# Patient Record
Sex: Male | Born: 1955 | Race: White | Hispanic: No | Marital: Married | Smoking: Current every day smoker
Health system: Southern US, Community
[De-identification: ages and names within clinical notes are randomized; demographics above are authoritative.]

## PROBLEM LIST (undated history)

## (undated) DIAGNOSIS — K219 Gastro-esophageal reflux disease without esophagitis: Secondary | ICD-10-CM

## (undated) DIAGNOSIS — G709 Myoneural disorder, unspecified: Secondary | ICD-10-CM

## (undated) DIAGNOSIS — F32A Depression, unspecified: Secondary | ICD-10-CM

## (undated) DIAGNOSIS — E785 Hyperlipidemia, unspecified: Secondary | ICD-10-CM

## (undated) DIAGNOSIS — I10 Essential (primary) hypertension: Secondary | ICD-10-CM

## (undated) DIAGNOSIS — N189 Chronic kidney disease, unspecified: Secondary | ICD-10-CM

## (undated) DIAGNOSIS — F329 Major depressive disorder, single episode, unspecified: Secondary | ICD-10-CM

## (undated) DIAGNOSIS — C61 Malignant neoplasm of prostate: Secondary | ICD-10-CM

## (undated) DIAGNOSIS — R519 Headache, unspecified: Secondary | ICD-10-CM

## (undated) DIAGNOSIS — I219 Acute myocardial infarction, unspecified: Secondary | ICD-10-CM

## (undated) HISTORY — DX: Essential (primary) hypertension: I10

## (undated) HISTORY — DX: Major depressive disorder, single episode, unspecified: F32.9

## (undated) HISTORY — DX: Depression, unspecified: F32.A

## (undated) HISTORY — DX: Gastro-esophageal reflux disease without esophagitis: K21.9

## (undated) HISTORY — DX: Hyperlipidemia, unspecified: E78.5

---

## 2011-12-29 HISTORY — PX: US ECHOCARDIOGRAPHY: HXRAD669

## 2011-12-29 HISTORY — PX: CORONARY ANGIOPLASTY WITH STENT PLACEMENT: SHX49

## 2012-01-19 ENCOUNTER — Ambulatory Visit: Payer: Self-pay | Admitting: Internal Medicine

## 2012-01-20 LAB — BASIC METABOLIC PANEL
BUN: 15 mg/dL (ref 7–18)
Calcium, Total: 8.9 mg/dL (ref 8.5–10.1)
Chloride: 103 mmol/L (ref 98–107)
EGFR (African American): >60
EGFR (Non-African Amer.): >60
Osmolality: 275 (ref 275–301)
Potassium: 4.1 mmol/L (ref 3.5–5.1)

## 2012-01-20 LAB — CK TOTAL AND CKMB (NOT AT ARMC)
CK, Total: 70 U/L (ref 35–232)
CK-MB: 1.7 ng/mL (ref 0.5–3.6)

## 2013-11-11 ENCOUNTER — Ambulatory Visit: Payer: Self-pay | Admitting: Family Medicine

## 2014-09-16 NOTE — Discharge Summary (Signed)
PATIENT NAME:  Michael Small, Michael Small MR#:  [id number] DATE OF BIRTH:  [birth year]  DATE OF ADMISSION:  01/19/2012 DATE OF DISCHARGE:  01/20/2012  DISCHARGE DIAGNOSES:  1. Known cardiovascular disease with unstable angina.  2. Hyperlipidemia.  3. Hypertension.  HISTORY: This is a 59 year old male with progressive episodes of chest discomfort, tobacco abuse, borderline hyperlipidemia and hypertension who underwent cardiac catheterization. The patient underwent cardiac catheterization showing significant 85% left anterior descending artery stenosis and minimal atherosclerosis of circumflex and right coronary artery. He therefore underwent drug-eluting stent placement in the left anterior descending artery without complication. He was ambulating well without any further significant symptoms and felt well and had reached his maximal hospital benefit. He was discharged to home in good condition with follow up in 3 to 4 days.   DISCHARGE MEDICATIONS: 1. Aspirin 81 mg p.o. daily.  2. Plavix 75 mg p.o. daily.   FOLLOW UP: He is to follow up a few days for further assessment of lipid management and medication management for hypertension.   ____________________________ Corey Skains, MD bjk:cms D: 01/20/2012 07:32:55 ET T: 01/20/2012 10:43:50 ET JOB#: [id number]  cc: Corey Skains, MD, <Dictator> Corey Skains MD ELECTRONICALLY SIGNED 01/23/2012 8:31

## 2015-02-06 ENCOUNTER — Other Ambulatory Visit: Payer: Self-pay | Admitting: Family Medicine

## 2015-03-28 ENCOUNTER — Other Ambulatory Visit: Payer: Self-pay | Admitting: Family Medicine

## 2015-04-01 ENCOUNTER — Other Ambulatory Visit: Payer: Self-pay | Admitting: Family Medicine

## 2015-04-27 ENCOUNTER — Other Ambulatory Visit: Payer: Self-pay | Admitting: Family Medicine

## 2015-04-27 NOTE — Telephone Encounter (Signed)
Patient has not been seen in nearly a year and a half, he needs to make appointment before we can approve prescription refill.

## 2015-05-08 DIAGNOSIS — E785 Hyperlipidemia, unspecified: Secondary | ICD-10-CM | POA: Insufficient documentation

## 2015-05-08 DIAGNOSIS — I10 Essential (primary) hypertension: Secondary | ICD-10-CM | POA: Insufficient documentation

## 2015-05-08 DIAGNOSIS — M1990 Unspecified osteoarthritis, unspecified site: Secondary | ICD-10-CM | POA: Insufficient documentation

## 2015-05-08 DIAGNOSIS — R739 Hyperglycemia, unspecified: Secondary | ICD-10-CM | POA: Insufficient documentation

## 2015-05-08 DIAGNOSIS — F32A Depression, unspecified: Secondary | ICD-10-CM | POA: Insufficient documentation

## 2015-05-08 DIAGNOSIS — F329 Major depressive disorder, single episode, unspecified: Secondary | ICD-10-CM | POA: Insufficient documentation

## 2015-05-08 DIAGNOSIS — I2510 Atherosclerotic heart disease of native coronary artery without angina pectoris: Secondary | ICD-10-CM | POA: Insufficient documentation

## 2015-05-08 DIAGNOSIS — B353 Tinea pedis: Secondary | ICD-10-CM | POA: Insufficient documentation

## 2015-05-08 DIAGNOSIS — R12 Heartburn: Secondary | ICD-10-CM | POA: Insufficient documentation

## 2015-05-08 DIAGNOSIS — F17210 Nicotine dependence, cigarettes, uncomplicated: Secondary | ICD-10-CM | POA: Insufficient documentation

## 2015-05-12 ENCOUNTER — Encounter: Payer: Self-pay | Admitting: Family Medicine

## 2015-05-12 ENCOUNTER — Ambulatory Visit (INDEPENDENT_AMBULATORY_CARE_PROVIDER_SITE_OTHER): Payer: Medicare Other | Admitting: Family Medicine

## 2015-05-12 VITALS — BP 160/88 | HR 87 | Temp 97.8°F | Resp 16 | Ht 71.0 in | Wt 201.0 lb

## 2015-05-12 DIAGNOSIS — I2510 Atherosclerotic heart disease of native coronary artery without angina pectoris: Secondary | ICD-10-CM

## 2015-05-12 DIAGNOSIS — I10 Essential (primary) hypertension: Secondary | ICD-10-CM | POA: Diagnosis not present

## 2015-05-12 DIAGNOSIS — Z720 Tobacco use: Secondary | ICD-10-CM | POA: Diagnosis not present

## 2015-05-12 DIAGNOSIS — Z23 Encounter for immunization: Secondary | ICD-10-CM | POA: Diagnosis not present

## 2015-05-12 DIAGNOSIS — R739 Hyperglycemia, unspecified: Secondary | ICD-10-CM | POA: Diagnosis not present

## 2015-05-12 LAB — POCT GLYCOSYLATED HEMOGLOBIN (HGB A1C)
ESTIMATED AVERAGE GLUCOSE: 114
Hemoglobin A1C: 5.6

## 2015-05-12 MED ORDER — LOSARTAN POTASSIUM-HCTZ 100-12.5 MG PO TABS: 1.0000 | ORAL_TABLET | Freq: Every day | ORAL | Status: DC

## 2015-05-12 MED ORDER — BUPROPION HCL ER (SR) 150 MG PO TB12: 150.0000 mg | ORAL_TABLET | Freq: Three times a day (TID) | ORAL | Status: DC

## 2015-05-12 MED ORDER — ATORVASTATIN CALCIUM 10 MG PO TABS: 10.0000 mg | ORAL_TABLET | Freq: Every day | ORAL | Status: DC

## 2015-05-12 MED ORDER — BUPROPION HCL ER (SR) 150 MG PO TB12: ORAL_TABLET | ORAL | Status: DC

## 2015-05-12 NOTE — Progress Notes (Signed)
Patient: Michael Small Male    DOB: 10-19-1955   59 y.o.   MRN: [id number] Visit Date: 05/12/2015  Today's Provider: [name], MD   Chief Complaint  Patient presents with  . Hypertension    follow up  . Hyperglycemia    follow up  . Hyperlipidemia    follow up   Subjective:    HPI   Hypertension, follow-up:  BP Readings from Last 3 Encounters:  11/11/13 166/100    He was last seen for hypertension 1 years ago.  BP at that visit was 160/100. Management since that visit includes started Hyzarr 100-12.'5mg'$  daily. He reports good compliance with treatment. Patient has been out of his medication  for 8 days. He is not having side effects.  He is not exercising. He is not adherent to low salt diet.   Outside blood pressures are average 155/82. He is experiencing fatigue.  Patient denies chest pain, chest pressure/discomfort, claudication, dyspnea, exertional chest pressure/discomfort, irregular heart beat, lower extremity edema, near-syncope, orthopnea, palpitations, paroxysmal nocturnal dyspnea, syncope and tachypnea.   Cardiovascular risk factors include advanced age (older than 78 for men, 80 for women), hypertension, male gender, sedentary lifestyle and smoking/ tobacco exposure.  Use of agents associated with hypertension: NSAIDS.     Weight trend: increasing steadily Wt Readings from Last 3 Encounters:  11/11/13 188 lb (85.276 kg)    Current diet: in general, an "unhealthy" diet  ------------------------------------------------------------------------   Hyperglycemia, Follow-up:   Lab Results  Component Value Date   GLUCOSE 101* 01/20/2012      Last seen for for this 1 years ago.  Management since then includes advising patient to avoid sweets in diet. Current symptoms include none and have been stable.  Weight trend: increasing steadily Prior visit with dietician: no Current diet: in general, an "unhealthy" diet Current exercise:  none  Pertinent Labs:    Component Value Date/Time   CREATININE 0.90 01/20/2012 0615    Wt Readings from Last 3 Encounters:  11/11/13 188 lb (85.276 kg)    Lipid/Cholesterol, Follow-up:   Last seen for this1 years ago.  Management changes since that visit include starting Atorvastatin '10mg'$  daily. . Last Lipid Panel:   Risk factors for vascular disease include hypertension and smoking  He reports poor compliance with treatment. Has been out of his medication for several months. He is not having side effects.  Current symptoms include none and have been stable. Weight trend: increasing steadily Prior visit with dietician: no Current diet: in general, an "unhealthy" diet Current exercise: none  Wt Readings from Last 3 Encounters:  11/11/13 188 lb (85.276 kg)    -------------------------------------------------------------------   Coronary artery disease, follow up  Denies any chest pain, heart flutters, or dyspnea. He was previously followed by Dr. [name], but states he is no longer going to cardiologist. Is taking ASA every day, but out of other medications as above.  Is still smoking 1/2 ppd.  Lipid Panel  No results found for: CHOL, TRIG, HDL, CHOLHDL, VLDL, LDLCALC, LDLDIRECT  ------------------------------------------------------------------------       No Known Allergies Previous Medications   ASPIRIN 81 MG TABLET    Take 1 tablet by mouth daily.   ATORVASTATIN (LIPITOR) 10 MG TABLET    Take 1 tablet by mouth daily.   BUPROPION (WELLBUTRIN SR) 150 MG 12 HR TABLET    Take 1 tablet (150 mg total) by mouth 3 (three) times daily.   LOSARTAN-HYDROCHLOROTHIAZIDE (HYZAAR) 100-12.5 MG  TABLET    TAKE 1 TABLET EVERY DAY   OMEGA 3 1000 MG CAPS    Take 1 capsule by mouth daily.    Review of Systems  Constitutional: Positive for fatigue. Negative for fever, chills and appetite change.  Respiratory: Negative for chest tightness, shortness of breath and wheezing.    Cardiovascular: Negative for chest pain and palpitations.  Gastrointestinal: Negative for nausea, vomiting and abdominal pain.  Endocrine: Positive for polyuria. Negative for cold intolerance, heat intolerance, polydipsia and polyphagia.  Musculoskeletal: Positive for back pain and arthralgias (right shoulder pain).    Social History  Substance Use Topics  . Smoking status: Current Every Day Smoker -- 0.50 packs/day for 30 years    Types: Cigarettes  . Smokeless tobacco: Never Used  . Alcohol Use: No   Objective:   BP 160/88 mmHg  Pulse 87  Temp(Src) 97.8 F (36.6 C) (Oral)  Resp 16  Ht '5\' 11"'$  (1.803 m)  Wt 201 lb (91.173 kg)  BMI 28.05 kg/m2  SpO2 99%  Physical Exam   General Appearance:    Alert, cooperative, no distress  Eyes:    PERRL, conjunctiva/corneas clear, EOM's intact       Lungs:     Clear to auscultation bilaterally, respirations unlabored  Heart:    Regular rate and rhythm  Neurologic:   Awake, alert, oriented x 3. No apparent focal neurological           defect.       Results for orders placed or performed in visit on 05/12/15  POCT HgB A1C  Result Value Ref Range   Hemoglobin A1C 5.6    Est. average glucose Bld gHb Est-mCnc 114         Assessment & Plan:     1. Hyperglycemia  - POCT HgB A1C - atorvastatin (LIPITOR) 10 MG tablet; Take 1 tablet (10 mg total) by mouth daily.  Dispense: 30 tablet; Refill: 3  2. Atherosclerosis of native coronary artery of native heart without angina pectoris Asymptomatic. Compliant with medication.  Continue aggressive risk factor modification.   - EKG 12-Lead  3. Essential hypertension BP uncontrolled off of medictions. Will restart and follow up in 3 months.  - losartan-hydrochlorothiazide (HYZAAR) 100-12.5 MG tablet; Take 1 tablet by mouth daily.  Dispense: 30 tablet; Refill: 3  4. Blood glucose elevated Doing better. Monitor 1-2 times per year.   5. Tobacco abuse Counseled health benefits of smoking  cessation. He did well with buproprion in the past and will restart - buPROPion (WELLBUTRIN SR) 150 MG 12 hr tablet; One daily for 3 days, then one twice daily for 3 days, then one tablet three times a day  Dispense: 90 tablet; Refill: 3  6. Need for influenza vaccination Discussed shingles vaccine which he declined.  - Flu Vaccine QUAD 36+ mos PF IM (Fluarix & Fluzone Quad PF)       [name], MD  [facility]

## 2015-05-12 NOTE — Patient Instructions (Signed)
Please stop smoking 

## 2015-07-09 ENCOUNTER — Other Ambulatory Visit: Payer: Self-pay | Admitting: *Deleted

## 2015-07-09 DIAGNOSIS — I10 Essential (primary) hypertension: Secondary | ICD-10-CM

## 2015-07-09 DIAGNOSIS — R739 Hyperglycemia, unspecified: Secondary | ICD-10-CM

## 2015-07-09 DIAGNOSIS — Z720 Tobacco use: Secondary | ICD-10-CM

## 2015-07-09 NOTE — Telephone Encounter (Signed)
Requesting 90 day supply, [facility] [address]

## 2015-07-10 ENCOUNTER — Other Ambulatory Visit: Payer: Self-pay | Admitting: *Deleted

## 2015-07-10 NOTE — Telephone Encounter (Signed)
Patient has ov appt scheduled 08/13/2015.

## 2015-07-13 ENCOUNTER — Other Ambulatory Visit: Payer: Self-pay | Admitting: *Deleted

## 2015-07-13 DIAGNOSIS — Z720 Tobacco use: Secondary | ICD-10-CM

## 2015-07-13 DIAGNOSIS — R739 Hyperglycemia, unspecified: Secondary | ICD-10-CM

## 2015-07-13 DIAGNOSIS — I10 Essential (primary) hypertension: Secondary | ICD-10-CM

## 2015-07-13 NOTE — Telephone Encounter (Signed)
Requesting 90 day supply.

## 2015-08-13 ENCOUNTER — Ambulatory Visit (INDEPENDENT_AMBULATORY_CARE_PROVIDER_SITE_OTHER): Payer: Medicare Other | Admitting: Family Medicine

## 2015-08-13 ENCOUNTER — Encounter: Payer: Self-pay | Admitting: Family Medicine

## 2015-08-13 VITALS — BP 160/80 | HR 78 | Temp 97.0°F | Resp 16 | Ht 71.0 in | Wt 202.0 lb

## 2015-08-13 DIAGNOSIS — E785 Hyperlipidemia, unspecified: Secondary | ICD-10-CM

## 2015-08-13 DIAGNOSIS — I10 Essential (primary) hypertension: Secondary | ICD-10-CM | POA: Diagnosis not present

## 2015-08-13 DIAGNOSIS — M7550 Bursitis of unspecified shoulder: Secondary | ICD-10-CM | POA: Insufficient documentation

## 2015-08-13 DIAGNOSIS — M7552 Bursitis of left shoulder: Secondary | ICD-10-CM

## 2015-08-13 MED ORDER — DICLOFENAC POTASSIUM 50 MG PO TABS: 50.0000 mg | ORAL_TABLET | Freq: Two times a day (BID) | ORAL | Status: DC | PRN

## 2015-08-13 NOTE — Patient Instructions (Addendum)
If your labs look, we will plan on increasing losartan-hctz to 100-'25mg'$  daily  You should see an orthopedist about your shoulder if the diclofenac

## 2015-08-13 NOTE — Progress Notes (Signed)
Patient: Michael Small Male    DOB: January 03, 1956   59 y.o.   MRN: [id number] Visit Date: 08/13/2015  Today's Provider: [name], MD   Chief Complaint  Patient presents with  . Follow-up  . Hypertension   Subjective:    HPI    Hypertension, follow-up:  BP Readings from Last 3 Encounters:  08/13/15 160/80  05/12/15 160/88  11/11/13 166/100    He was last seen for hypertension 3 months ago.  BP at that visit was 160/88. Management since that visit includes; restarted losartan-HCTZ 100-12.5 mg mg qd..He reports good compliance with treatment. He is not having side effects. none  He is exercising. He is not adherent to low salt diet.   Outside blood pressures are 140/85. He is experiencing none.  Patient denies none.   Cardiovascular risk factors include none.  Use of agents associated with hypertension: none.   ----------------------------------------------------------------------  Shoulder pain  He reports pain left shoulder for the last couple of months and not improving at all with OTC pain medications. He thinks he may have overused it, but does not recall any specific injury.    No Known Allergies Previous Medications   ASPIRIN 81 MG TABLET    Take 1 tablet by mouth daily.   ATORVASTATIN (LIPITOR) 10 MG TABLET    Take 1 tablet (10 mg total) by mouth daily.   BUPROPION (WELLBUTRIN SR) 150 MG 12 HR TABLET    One daily for 3 days, then one twice daily for 3 days, then one tablet three times a day   LOSARTAN-HYDROCHLOROTHIAZIDE (HYZAAR) 100-12.5 MG TABLET    Take 1 tablet by mouth daily.    Review of Systems  Constitutional: Negative for fever, chills and appetite change.  Respiratory: Negative for chest tightness, shortness of breath and wheezing.   Cardiovascular: Negative for chest pain and palpitations.  Gastrointestinal: Negative for nausea, vomiting and abdominal pain.    Social History  Substance Use Topics  . Smoking status: Current Every  Day Smoker -- 0.50 packs/day for 30 years    Types: Cigarettes  . Smokeless tobacco: Never Used  . Alcohol Use: No   Objective:   BP 160/80 mmHg  Pulse 78  Temp(Src) 97 F (36.1 C) (Oral)  Resp 16  Ht '5\' 11"'$  (1.803 m)  Wt 202 lb (91.627 kg)  BMI 28.19 kg/m2  SpO2 97%  Physical Exam   General Appearance:    Alert, cooperative, no distress  Eyes:    PERRL, conjunctiva/corneas clear, EOM's intact       Lungs:     Clear to auscultation bilaterally, respirations unlabored  Heart:    Regular rate and rhythm  Neurologic:   Awake, alert, oriented x 3. No apparent focal neurological           defect.   MS:   Tender lateral aspect of shoulder. Pain with abduction, but full ROM. No gross deformities.       Assessment & Plan:      1. Essential hypertension Tolerating losartan-hctz, will increase to 100-'25mg'$  daily.  - Renal function panel - losartan-hydrochlorothiazide (HYZAAR) 100-25 MG tablet; Take 1 tablet by mouth daily.  Dispense: 30 tablet; Refill: 3  2. HLD (hyperlipidemia) He is tolerating atorvastatin well with no adverse effects.   - Lipid panel - Hepatic function panel  3. . Bursitis, shoulder, left Try prescription NSAID. Advised that if it is not effective I would recommend orthopedics referral and consideration of steroid  injection.  - diclofenac (CATAFLAM) 50 MG tablet; Take 1 tablet (50 mg total) by mouth 2 (two) times daily as needed (shoulder pain).  Dispense: 30 tablet; Refill: 3       [name], MD  [facility]

## 2015-08-14 ENCOUNTER — Telehealth: Payer: Self-pay

## 2015-08-14 DIAGNOSIS — R739 Hyperglycemia, unspecified: Secondary | ICD-10-CM

## 2015-08-14 LAB — RENAL FUNCTION PANEL
Albumin: 4.6 g/dL (ref 3.6–4.8)
BUN/Creatinine Ratio: 13 (ref 10–22)
BUN: 20 mg/dL (ref 8–27)
CALCIUM: 9.7 mg/dL (ref 8.6–10.2)
CO2: 23 mmol/L (ref 18–29)
CREATININE: 1.55 mg/dL — AB (ref 0.76–1.27)
Chloride: 97 mmol/L (ref 96–106)
GFR calc Af Amer: 55 mL/min/{1.73_m2} — ABNORMAL LOW (ref >59)
GFR, EST NON AFRICAN AMERICAN: 48 mL/min/{1.73_m2} — AB (ref >59)
Glucose: 97 mg/dL (ref 65–99)
PHOSPHORUS: 3.1 mg/dL (ref 2.5–4.5)
Potassium: 4.9 mmol/L (ref 3.5–5.2)
Sodium: 139 mmol/L (ref 134–144)

## 2015-08-14 LAB — HEPATIC FUNCTION PANEL
ALT: 16 IU/L (ref 0–44)
AST: 13 IU/L (ref 0–40)
Alkaline Phosphatase: 71 IU/L (ref 39–117)
BILIRUBIN TOTAL: 0.2 mg/dL (ref 0.0–1.2)
BILIRUBIN, DIRECT: 0.07 mg/dL (ref 0.00–0.40)
TOTAL PROTEIN: 7.1 g/dL (ref 6.0–8.5)

## 2015-08-14 LAB — LIPID PANEL
CHOLESTEROL TOTAL: 236 mg/dL — AB (ref 100–199)
Chol/HDL Ratio: 6.2 ratio units — ABNORMAL HIGH (ref 0.0–5.0)
HDL: 38 mg/dL — ABNORMAL LOW (ref >39)
LDL CALC: 159 mg/dL — AB (ref 0–99)
Triglycerides: 196 mg/dL — ABNORMAL HIGH (ref 0–149)
VLDL CHOLESTEROL CAL: 39 mg/dL (ref 5–40)

## 2015-08-14 MED ORDER — LOSARTAN POTASSIUM-HCTZ 100-25 MG PO TABS: 1.0000 | ORAL_TABLET | Freq: Every day | ORAL | Status: DC

## 2015-08-14 MED ORDER — ATORVASTATIN CALCIUM 20 MG PO TABS: 10.0000 mg | ORAL_TABLET | Freq: Every day | ORAL | Status: DC

## 2015-08-14 NOTE — Telephone Encounter (Signed)
Losartan-hctz 100-25. rx has already been sent to pharmacy. Thanks.

## 2015-08-14 NOTE — Telephone Encounter (Signed)
Patient advised as below. Patient verbalizes understanding and is in agreement with treatment plan.  

## 2015-08-14 NOTE — Telephone Encounter (Signed)
Dr. Caryn Section please clarify the dose on losartan-hctz thank you. sd

## 2015-08-14 NOTE — Telephone Encounter (Signed)
-----   Message from Birdie Sons, MD sent at 08/14/2015  7:51 AM EDT ----- Need to increase atorvastatin to '20mg'$  daily, #30, rf x3. Please advise and send new rx. Otherwise labs are good. Go ahead and increase losartan-hctz to 20-25. [facility]. Schedule follow up in 3 months.

## 2015-09-26 ENCOUNTER — Other Ambulatory Visit: Payer: Self-pay | Admitting: Family Medicine

## 2015-11-11 ENCOUNTER — Ambulatory Visit: Payer: Medicare Other | Admitting: Family Medicine

## 2015-12-21 ENCOUNTER — Other Ambulatory Visit: Payer: Self-pay | Admitting: Family Medicine

## 2015-12-21 DIAGNOSIS — I10 Essential (primary) hypertension: Secondary | ICD-10-CM

## 2016-06-10 ENCOUNTER — Other Ambulatory Visit: Payer: Self-pay | Admitting: Family Medicine

## 2016-07-13 ENCOUNTER — Other Ambulatory Visit: Payer: Self-pay | Admitting: Family Medicine

## 2016-07-13 DIAGNOSIS — I10 Essential (primary) hypertension: Secondary | ICD-10-CM

## 2016-08-03 ENCOUNTER — Other Ambulatory Visit: Payer: Self-pay | Admitting: Family Medicine

## 2016-08-03 DIAGNOSIS — I10 Essential (primary) hypertension: Secondary | ICD-10-CM

## 2016-08-26 ENCOUNTER — Other Ambulatory Visit: Payer: Self-pay | Admitting: Family Medicine

## 2016-09-04 ENCOUNTER — Other Ambulatory Visit: Payer: Self-pay | Admitting: Family Medicine

## 2016-09-04 DIAGNOSIS — I10 Essential (primary) hypertension: Secondary | ICD-10-CM

## 2016-10-10 ENCOUNTER — Other Ambulatory Visit: Payer: Self-pay | Admitting: Family Medicine

## 2016-10-10 DIAGNOSIS — I10 Essential (primary) hypertension: Secondary | ICD-10-CM

## 2016-11-03 ENCOUNTER — Other Ambulatory Visit: Payer: Self-pay | Admitting: Family Medicine

## 2016-11-03 DIAGNOSIS — I10 Essential (primary) hypertension: Secondary | ICD-10-CM

## 2016-11-03 NOTE — Telephone Encounter (Addendum)
Scheduled f/u for 11/15/2016 at 8:15 am.

## 2016-11-03 NOTE — Telephone Encounter (Signed)
Please advise patient he is overdue for office visit and needs to schedule appt before we can approve any additional refills.

## 2016-11-15 ENCOUNTER — Ambulatory Visit (INDEPENDENT_AMBULATORY_CARE_PROVIDER_SITE_OTHER): Payer: Medicare Other | Admitting: Family Medicine

## 2016-11-15 ENCOUNTER — Encounter: Payer: Self-pay | Admitting: Family Medicine

## 2016-11-15 VITALS — BP 144/70 | HR 79 | Temp 97.4°F | Resp 16 | Ht 71.0 in | Wt 182.0 lb

## 2016-11-15 DIAGNOSIS — R739 Hyperglycemia, unspecified: Secondary | ICD-10-CM | POA: Diagnosis not present

## 2016-11-15 DIAGNOSIS — I2510 Atherosclerotic heart disease of native coronary artery without angina pectoris: Secondary | ICD-10-CM

## 2016-11-15 DIAGNOSIS — E785 Hyperlipidemia, unspecified: Secondary | ICD-10-CM | POA: Diagnosis not present

## 2016-11-15 DIAGNOSIS — I10 Essential (primary) hypertension: Secondary | ICD-10-CM

## 2016-11-15 DIAGNOSIS — Z23 Encounter for immunization: Secondary | ICD-10-CM | POA: Diagnosis not present

## 2016-11-15 DIAGNOSIS — F17210 Nicotine dependence, cigarettes, uncomplicated: Secondary | ICD-10-CM | POA: Diagnosis not present

## 2016-11-15 NOTE — Patient Instructions (Addendum)
   Dr. Brita Romp will be starting in August and should be able to see your wife as a patient   Screening for lung cancer is recommended for people between 68 and 62 years of age who have smoked the equivalent of 1 pack per day for 30 years. Please call our office at [phone] to schedule a low dose CT lung scan for lung cancer screening.

## 2016-11-15 NOTE — Progress Notes (Signed)
Patient: Michael Small Male    DOB: 1955/11/26   61 y.o.   MRN: [id number] Visit Date: 11/15/2016  Today's Provider: [name], MD   Chief Complaint  Patient presents with  . Follow-up  . Hypertension  . Hyperlipidemia   Subjective:    HPI   Hypertension, follow-up:  BP Readings from Last 3 Encounters:  08/13/15 (!) 160/80  05/12/15 (!) 160/88  11/11/13 (!) 166/100    He was last seen for hypertension 3 months ago.  BP at that visit was 160/80. Management since that visit includes; increased Losartan-HCTZ to 100-25mg .He reports good compliance with treatment. He is not having side effects. none He is exercising. He is adherent to low salt diet.   Outside blood pressures are 123/73. He is experiencing none.  Patient denies none.   Cardiovascular risk factors include none.  Use of agents associated with hypertension: none.   ----------------------------------------------------------------   Lipid/Cholesterol, Follow-up:   Last seen for this 3 months ago.  Management since that visit includes; increased atorvastatin to 20 mg qd.  Last Lipid Panel:    Component Value Date/Time   CHOL 236 (H) 08/13/2015 1202   TRIG 196 (H) 08/13/2015 1202   HDL 38 (L) 08/13/2015 1202   CHOLHDL 6.2 (H) 08/13/2015 1202   LDLCALC 159 (H) 08/13/2015 1202    He reports good compliance with treatment. He is not having side effects. none  Wt Readings from Last 3 Encounters:  08/13/15 202 lb (91.6 kg)  05/12/15 201 lb (91.2 kg)  11/11/13 188 lb (85.3 kg)    ----------------------------------------------------------------  Hyperglycemia From 05/12/2015-labs checked, no changes. A1C 5.6.  Tobacco abuse: Formerly smoke 2-3 ppd but has cut back to 1/2 ppd since being on bupropion. He is tolerating medication well and thinks it is helping cut urge to smoke. He is still working on stopping completely.    No Known Allergies   Current Outpatient Prescriptions:  .   aspirin 81 MG tablet, Take 1 tablet by mouth daily., Disp: , Rfl:  .  atorvastatin (LIPITOR) 20 MG tablet, Take 0.5 tablets (10 mg total) by mouth daily., Disp: 30 tablet, Rfl: 3 .  buPROPion (WELLBUTRIN SR) 150 MG 12 hr tablet, TAKE 1 TABLET BY MOUTH 3 TIMES A DAY**NEEDS OFFICE VISIT**, Disp: 45 tablet, Rfl: 5 .  diclofenac (CATAFLAM) 50 MG tablet, Take 1 tablet (50 mg total) by mouth 2 (two) times daily as needed (shoulder pain)., Disp: 30 tablet, Rfl: 3 .  losartan-hydrochlorothiazide (HYZAAR) 100-25 MG tablet, TAKE 1 TABLET BY MOUTH EVERY DAY, Disp: 30 tablet, Rfl: 0  Review of Systems  Constitutional: Negative for appetite change, chills and fever.  Respiratory: Negative for chest tightness, shortness of breath and wheezing.   Cardiovascular: Negative for chest pain and palpitations.  Gastrointestinal: Negative for abdominal pain, nausea and vomiting.    Social History  Substance Use Topics  . Smoking status: Current Every Day Smoker    Packs/day: 0.50    Years: 30.00    Types: Cigarettes  . Smokeless tobacco: Never Used  . Alcohol use No   Objective:   BP (!) 170/90 (BP Location: Right Arm, Patient Position: Sitting, Cuff Size: Normal)   Pulse 79   Temp 97.4 F (36.3 C) (Oral)   Resp 16   Ht 5\' 11"  (1.803 m)   Wt 182 lb (82.6 kg)   SpO2 98%   BMI 25.38 kg/m  Vitals:   11/15/16 1194 11/15/16 0835 11/15/16 1740  BP: (!) 170/90 (!) 150/80 (!) 144/70  Pulse: 79    Resp: 16    Temp: 97.4 F (36.3 C)    TempSrc: Oral    SpO2: 98%    Weight: 182 lb (82.6 kg)    Height: 5\' 11"  (1.803 m)       Physical Exam   General Appearance:    Alert, cooperative, no distress  Eyes:    PERRL, conjunctiva/corneas clear, EOM's intact       Lungs:     Clear to auscultation bilaterally, respirations unlabored  Heart:    Regular rate and rhythm  Neurologic:   Awake, alert, oriented x 3. No apparent focal neurological           defect.           Assessment & Plan:     1.  Essential hypertension Improved with increase losartan-hctz. Continue current medications.  For now. Home BP reading are considerably better than office readings.  - Comprehensive metabolic panel  2. Hyperlipidemia, unspecified hyperlipidemia type He is tolerating atorvastatin well with no adverse effects.   - Comprehensive metabolic panel - Lipid panel  3. Blood glucose elevated  - Hemoglobin A1c  4. Atherosclerosis of native coronary artery of native heart without angina pectoris Asymptomatic. Compliant with medication.  Continue aggressive risk factor modification.    5. Smoking greater than 30 pack years Continue bupropion and efforts to quit smoking.  - Ambulatory Referral for Lung Cancer Scre  6. Need for prophylactic vaccination using tetanus and diphtheria toxoids adsorbed (Td) vaccine  - Tdap vaccine greater than or equal to 7yo IM  Return in about 6 months (around 05/17/2017).   The entirety of the information documented in the History of Present Illness, Review of Systems and Physical Exam were personally obtained by me. Portions of this information were initially documented by April M. Sabra Heck, CMA and reviewed by me for thoroughness and accuracy.          [name], MD  [facility]

## 2016-11-16 ENCOUNTER — Other Ambulatory Visit: Payer: Self-pay

## 2016-11-16 ENCOUNTER — Telehealth: Payer: Self-pay | Admitting: *Deleted

## 2016-11-16 DIAGNOSIS — Z87891 Personal history of nicotine dependence: Secondary | ICD-10-CM

## 2016-11-16 DIAGNOSIS — R739 Hyperglycemia, unspecified: Secondary | ICD-10-CM

## 2016-11-16 DIAGNOSIS — E785 Hyperlipidemia, unspecified: Secondary | ICD-10-CM

## 2016-11-16 LAB — COMPREHENSIVE METABOLIC PANEL
ALK PHOS: 71 IU/L (ref 39–117)
ALT: 16 IU/L (ref 0–44)
AST: 12 IU/L (ref 0–40)
Albumin/Globulin Ratio: 1.8 (ref 1.2–2.2)
Albumin: 4.5 g/dL (ref 3.6–4.8)
BUN/Creatinine Ratio: 26 — ABNORMAL HIGH (ref 10–24)
BUN: 40 mg/dL — ABNORMAL HIGH (ref 8–27)
Bilirubin Total: 0.3 mg/dL (ref 0.0–1.2)
CALCIUM: 10.1 mg/dL (ref 8.6–10.2)
CO2: 22 mmol/L (ref 20–29)
CREATININE: 1.55 mg/dL — AB (ref 0.76–1.27)
Chloride: 100 mmol/L (ref 96–106)
GFR calc Af Amer: 55 mL/min/{1.73_m2} — ABNORMAL LOW (ref >59)
GFR, EST NON AFRICAN AMERICAN: 48 mL/min/{1.73_m2} — AB (ref >59)
GLOBULIN, TOTAL: 2.5 g/dL (ref 1.5–4.5)
Glucose: 102 mg/dL — ABNORMAL HIGH (ref 65–99)
Potassium: 4.8 mmol/L (ref 3.5–5.2)
Sodium: 139 mmol/L (ref 134–144)
Total Protein: 7 g/dL (ref 6.0–8.5)

## 2016-11-16 LAB — LIPID PANEL
CHOL/HDL RATIO: 4.4 ratio (ref 0.0–5.0)
CHOLESTEROL TOTAL: 201 mg/dL — AB (ref 100–199)
HDL: 46 mg/dL (ref >39)
LDL CALC: 133 mg/dL — AB (ref 0–99)
TRIGLYCERIDES: 109 mg/dL (ref 0–149)
VLDL Cholesterol Cal: 22 mg/dL (ref 5–40)

## 2016-11-16 LAB — HEMOGLOBIN A1C
Est. average glucose Bld gHb Est-mCnc: 120 mg/dL
Hgb A1c MFr Bld: 5.8 % — ABNORMAL HIGH (ref 4.8–5.6)

## 2016-11-16 MED ORDER — ATORVASTATIN CALCIUM 20 MG PO TABS: 20.0000 mg | ORAL_TABLET | Freq: Every day | ORAL | 6 refills | Status: DC

## 2016-11-16 NOTE — Telephone Encounter (Signed)
-----   Message from Birdie Sons, MD sent at 11/16/2016 12:43 PM EDT ----- He should start back on the 20mg  tablet, same quantity and refills.

## 2016-11-16 NOTE — Telephone Encounter (Signed)
Received referral for initial lung cancer screening scan. Contacted patient and obtained smoking history,(current, 96 pack year) as well as answering questions related to screening process. Patient denies signs of lung cancer such as weight loss or hemoptysis. Patient denies comorbidity that would prevent curative treatment if lung cancer were found. Patient is scheduled for shared decision making visit and CT scan on 11/29/16.

## 2016-11-16 NOTE — Telephone Encounter (Signed)
Rx sent to pharmacy   

## 2016-11-24 ENCOUNTER — Encounter: Payer: Self-pay | Admitting: Family Medicine

## 2016-11-24 ENCOUNTER — Ambulatory Visit (INDEPENDENT_AMBULATORY_CARE_PROVIDER_SITE_OTHER): Payer: Medicare Other | Admitting: Family Medicine

## 2016-11-24 VITALS — BP 110/66 | HR 86 | Temp 98.5°F | Resp 16 | Wt 181.2 lb

## 2016-11-24 DIAGNOSIS — I2510 Atherosclerotic heart disease of native coronary artery without angina pectoris: Secondary | ICD-10-CM | POA: Diagnosis not present

## 2016-11-24 DIAGNOSIS — L089 Local infection of the skin and subcutaneous tissue, unspecified: Secondary | ICD-10-CM | POA: Diagnosis not present

## 2016-11-24 DIAGNOSIS — S81851A Open bite, right lower leg, initial encounter: Secondary | ICD-10-CM | POA: Diagnosis not present

## 2016-11-24 DIAGNOSIS — W540XXA Bitten by dog, initial encounter: Secondary | ICD-10-CM

## 2016-11-24 MED ORDER — SODIUM CHLORIDE 0.9 % EX SOLN: 2.0000 "application " | Freq: Two times a day (BID) | CUTANEOUS | 0 refills | Status: DC

## 2016-11-24 MED ORDER — AMOXICILLIN-POT CLAVULANATE 875-125 MG PO TABS: 1.0000 | ORAL_TABLET | Freq: Two times a day (BID) | ORAL | 0 refills | Status: DC

## 2016-11-24 NOTE — Patient Instructions (Signed)
Cleanse wounds daily with saline and put on a bandaid.

## 2016-11-24 NOTE — Progress Notes (Signed)
Subjective:     Patient ID: Michael Small, male   DOB: 25-Dec-1955, 61 y.o.   MRN: [id number]  HPI  Chief Complaint  Patient presents with  . Animal Bite    Patient comes in office today to address dog bite to his right leg on 11/06/16. Patient states that he was bitten by his friends dog, patient reports that dog is up to date on his shots. Patient reports that he clean site with soap and water and applied bandaid. Patient states that over the past 3 days his skin has become red and has had yellow drainage. Patients last recorded TDap was 11/15/16.  Also has been using antibiotic cream.   Review of Systems     Objective:   Physical Exam  Constitutional: He appears well-developed and well-nourished. No distress.  Skin:  Right lower calf with two sets of puncture wound with surrounding erythema. No drainage expressed.  Cleansed with saline and dressed.     Assessment:    1. Dog bite of right lower leg with infection, initial encounter - amoxicillin-clavulanate (AUGMENTIN) 875-125 MG tablet; Take 1 tablet by mouth 2 (two) times daily.  Dispense: 20 tablet; Refill: 0 - SODIUM CHLORIDE, EXTERNAL, ([facility] SALINE WOUND [place]) 0.9 % SOLN; Apply 2 application topically 2 (two) times daily.  Dispense: 210 mL; Refill: 0    Plan:   Cleanse daily with saline and apply bandaid.

## 2016-11-29 ENCOUNTER — Other Ambulatory Visit: Payer: Self-pay

## 2016-11-29 ENCOUNTER — Telehealth: Payer: Self-pay | Admitting: Emergency Medicine

## 2016-11-29 ENCOUNTER — Encounter: Payer: Self-pay | Admitting: Oncology

## 2016-11-29 ENCOUNTER — Ambulatory Visit: Admission: RE | Admit: 2016-11-29 | Payer: Medicare Other | Source: Ambulatory Visit

## 2016-11-29 ENCOUNTER — Inpatient Hospital Stay: Payer: Medicare Other | Attending: Oncology | Admitting: Oncology

## 2016-11-29 ENCOUNTER — Ambulatory Visit: Payer: Medicare Other

## 2016-11-29 ENCOUNTER — Ambulatory Visit
Admission: RE | Admit: 2016-11-29 | Discharge: 2016-11-29 | Disposition: A | Discharge status: Home / Self Care | Payer: Medicare Other | Source: Ambulatory Visit | Attending: Oncology | Admitting: Oncology

## 2016-11-29 DIAGNOSIS — I2510 Atherosclerotic heart disease of native coronary artery without angina pectoris: Secondary | ICD-10-CM | POA: Diagnosis not present

## 2016-11-29 DIAGNOSIS — F17210 Nicotine dependence, cigarettes, uncomplicated: Secondary | ICD-10-CM | POA: Diagnosis not present

## 2016-11-29 DIAGNOSIS — Z122 Encounter for screening for malignant neoplasm of respiratory organs: Secondary | ICD-10-CM | POA: Diagnosis not present

## 2016-11-29 DIAGNOSIS — J439 Emphysema, unspecified: Secondary | ICD-10-CM | POA: Insufficient documentation

## 2016-11-29 DIAGNOSIS — Z87891 Personal history of nicotine dependence: Secondary | ICD-10-CM | POA: Diagnosis not present

## 2016-11-29 DIAGNOSIS — Z1211 Encounter for screening for malignant neoplasm of colon: Secondary | ICD-10-CM

## 2016-11-29 NOTE — Telephone Encounter (Signed)
Pt wife called wanting to know the status of his cologaurd testing. They have not received anything about this. I did not see any orders for this or any mention of it. If this is something that you want ordered pt wife says to go ahead and order this. Please advise. Thanks.

## 2016-11-29 NOTE — Progress Notes (Signed)
In accordance with CMS guidelines, patient has met eligibility criteria including age, absence of signs or symptoms of lung cancer.  Social History  Substance Use Topics  . Smoking status: Current Every Day Smoker    Packs/day: 2.00    Years: 48.00    Types: Cigarettes  . Smokeless tobacco: Never Used  . Alcohol use No     A shared decision-making session was conducted prior to the performance of CT scan. This includes one or more decision aids, includes benefits and harms of screening, follow-up diagnostic testing, over-diagnosis, false positive rate, and total radiation exposure.  Counseling on the importance of adherence to annual lung cancer LDCT screening, impact of co-morbidities, and ability or willingness to undergo diagnosis and treatment is imperative for compliance of the program.  Counseling on the importance of continued smoking cessation for former smokers; the importance of smoking cessation for current smokers, and information about tobacco cessation interventions have been given to patient including [place] and 1800 quit [place] programs.  Written order for lung cancer screening with LDCT has been given to the patient and any and all questions have been answered to the best of my abilities.   Yearly follow up will be coordinated by Burgess Estelle, Thoracic Navigator.  Faythe Casa, NP 11/29/2016 12:55 PM

## 2016-11-29 NOTE — Telephone Encounter (Signed)
Order placed and patient advised  ED

## 2016-12-01 ENCOUNTER — Other Ambulatory Visit: Payer: Self-pay | Admitting: Family Medicine

## 2016-12-01 ENCOUNTER — Telehealth: Payer: Self-pay

## 2016-12-01 DIAGNOSIS — I10 Essential (primary) hypertension: Secondary | ICD-10-CM

## 2016-12-01 NOTE — Telephone Encounter (Signed)
Patients wife called wanting results of Low dose CT scan. Results are in patients chart, but it has Fenton Malling as the ordering provider. Should I forward this message to her to review the scan?

## 2016-12-02 ENCOUNTER — Telehealth: Payer: Self-pay | Admitting: Family Medicine

## 2016-12-02 ENCOUNTER — Encounter: Payer: Self-pay | Admitting: Family Medicine

## 2016-12-02 ENCOUNTER — Telehealth: Payer: Self-pay | Admitting: *Deleted

## 2016-12-02 DIAGNOSIS — J439 Emphysema, unspecified: Secondary | ICD-10-CM | POA: Insufficient documentation

## 2016-12-02 NOTE — Telephone Encounter (Signed)
Notified patient of LDCT lung cancer screening program results with recommendation for 6 month follow up imaging. Also notified of incidental findings noted below and is encouraged to discuss further with PCP who will receive a copy of this note and/or the CT report. Patient verbalizes understanding.   IMPRESSION: 1. Lung-RADS 3-S, probably benign findings. Dominant nodular opacity at the right lung apex within a background of biapical reticulonodular scarring. Short-term follow-up in 6 months is recommended with repeat low-dose chest CT without contrast (please use the following order, "CT CHEST LCS NODULE FOLLOW-UP W/O CM"). 2. The "S" modifier above refers to potentially clinically significant non lung cancer related findings. Specifically, three-vessel coronary atherosclerosis.  Aortic Atherosclerosis (ICD10-I70.0) and Emphysema (ICD10-J43.9).

## 2016-12-02 NOTE — Telephone Encounter (Signed)
Order for cologuard faxed to [facility] °

## 2016-12-07 ENCOUNTER — Ambulatory Visit: Payer: Self-pay | Admitting: Oncology

## 2016-12-16 DIAGNOSIS — Z1211 Encounter for screening for malignant neoplasm of colon: Secondary | ICD-10-CM | POA: Diagnosis not present

## 2016-12-16 DIAGNOSIS — Z1212 Encounter for screening for malignant neoplasm of rectum: Secondary | ICD-10-CM | POA: Diagnosis not present

## 2016-12-16 LAB — COLOGUARD

## 2016-12-19 LAB — COLOGUARD: Cologuard: NEGATIVE

## 2016-12-22 ENCOUNTER — Other Ambulatory Visit: Payer: Self-pay | Admitting: *Deleted

## 2016-12-22 DIAGNOSIS — Z1211 Encounter for screening for malignant neoplasm of colon: Secondary | ICD-10-CM

## 2016-12-29 ENCOUNTER — Telehealth: Payer: Self-pay | Admitting: Family Medicine

## 2016-12-29 NOTE — Telephone Encounter (Signed)
Patient's wife Parke Simmers was notified cologuard was normal.

## 2016-12-29 NOTE — Telephone Encounter (Signed)
Pt's wife called wantingto know if results are back in from the colorguard test he mailed off about a week ago  Call back number is [phone]  Thanks.teri

## 2017-01-02 ENCOUNTER — Encounter: Payer: Self-pay | Admitting: Family Medicine

## 2017-01-26 ENCOUNTER — Telehealth: Payer: Self-pay | Admitting: Family Medicine

## 2017-03-29 ENCOUNTER — Other Ambulatory Visit: Payer: Self-pay | Admitting: Family Medicine

## 2017-04-17 ENCOUNTER — Ambulatory Visit (INDEPENDENT_AMBULATORY_CARE_PROVIDER_SITE_OTHER): Payer: Medicare Other

## 2017-04-17 ENCOUNTER — Ambulatory Visit (INDEPENDENT_AMBULATORY_CARE_PROVIDER_SITE_OTHER): Payer: Medicare Other | Admitting: Family Medicine

## 2017-04-17 VITALS — BP 146/68 | HR 100 | Temp 98.7°F | Ht 71.0 in | Wt 175.8 lb

## 2017-04-17 VITALS — BP 148/70

## 2017-04-17 DIAGNOSIS — R197 Diarrhea, unspecified: Secondary | ICD-10-CM | POA: Diagnosis not present

## 2017-04-17 DIAGNOSIS — Z23 Encounter for immunization: Secondary | ICD-10-CM

## 2017-04-17 DIAGNOSIS — I2510 Atherosclerotic heart disease of native coronary artery without angina pectoris: Secondary | ICD-10-CM

## 2017-04-17 DIAGNOSIS — I890 Lymphedema, not elsewhere classified: Secondary | ICD-10-CM | POA: Diagnosis not present

## 2017-04-17 DIAGNOSIS — R634 Abnormal weight loss: Secondary | ICD-10-CM | POA: Diagnosis not present

## 2017-04-17 DIAGNOSIS — Z0000 Encounter for general adult medical examination without abnormal findings: Secondary | ICD-10-CM

## 2017-04-17 LAB — CBC
HEMATOCRIT: 37.8 % — AB (ref 38.5–50.0)
HEMOGLOBIN: 13.1 g/dL — AB (ref 13.2–17.1)
MCH: 31.4 pg (ref 27.0–33.0)
MCHC: 34.7 g/dL (ref 32.0–36.0)
MCV: 90.6 fL (ref 80.0–100.0)
MPV: 11.2 fL (ref 7.5–12.5)
Platelets: 209 10*3/uL (ref 140–400)
RBC: 4.17 10*6/uL — ABNORMAL LOW (ref 4.20–5.80)
RDW: 12.9 % (ref 11.0–15.0)
WBC: 8.9 10*3/uL (ref 3.8–10.8)

## 2017-04-17 LAB — COMPLETE METABOLIC PANEL WITH GFR
AG RATIO: 1.4 (calc) (ref 1.0–2.5)
ALBUMIN MSPROF: 4.1 g/dL (ref 3.6–5.1)
ALKALINE PHOSPHATASE (APISO): 78 U/L (ref 40–115)
ALT: 8 U/L — ABNORMAL LOW (ref 9–46)
AST: 11 U/L (ref 10–35)
BILIRUBIN TOTAL: 0.3 mg/dL (ref 0.2–1.2)
BUN / CREAT RATIO: 15 (calc) (ref 6–22)
BUN: 48 mg/dL — ABNORMAL HIGH (ref 7–25)
CHLORIDE: 108 mmol/L (ref 98–110)
CO2: 24 mmol/L (ref 20–32)
Calcium: 9.6 mg/dL (ref 8.6–10.3)
Creat: 3.22 mg/dL — ABNORMAL HIGH (ref 0.70–1.25)
GFR, Est African American: 23 mL/min/{1.73_m2} — ABNORMAL LOW (ref >=60)
GFR, Est Non African American: 20 mL/min/{1.73_m2} — ABNORMAL LOW (ref >=60)
GLOBULIN: 2.9 g/dL (ref 1.9–3.7)
GLUCOSE: 86 mg/dL (ref 65–99)
Potassium: 4.4 mmol/L (ref 3.5–5.3)
SODIUM: 142 mmol/L (ref 135–146)
Total Protein: 7 g/dL (ref 6.1–8.1)

## 2017-04-17 LAB — TSH: TSH: 2.36 mIU/L (ref 0.40–4.50)

## 2017-04-17 NOTE — Progress Notes (Signed)
Subjective:   Michael Small is a 61 y.o. male who presents for an Initial Medicare Annual Wellness Visit.  Review of Systems  N/A  Cardiac Risk Factors include: advanced age (>28men, >45 women);dyslipidemia;hypertension;male gender;smoking/ tobacco exposure    Objective:    Today's Vitals   04/17/17 1504  BP: (!) 146/68  Pulse: 100  Temp: 98.7 F (37.1 C)  TempSrc: Oral  Weight: 175 lb 12.8 oz (79.7 kg)  Height: 5\' 11"  (1.803 m)  PainSc: 0-No pain   Body mass index is 24.52 kg/m.  Current Medications (verified) Outpatient Encounter Medications as of 04/17/2017  Medication Sig  . aspirin 81 MG tablet Take 1 tablet by mouth daily.  Marland Kitchen atorvastatin (LIPITOR) 20 MG tablet Take 1 tablet (20 mg total) by mouth daily. (Patient taking differently: Take 20 mg daily by mouth. )  . ibuprofen (ADVIL,MOTRIN) 200 MG tablet Take 200 mg every 6 (six) hours as needed by mouth.  . losartan-hydrochlorothiazide (HYZAAR) 100-25 MG tablet TAKE 1 TABLET BY MOUTH EVERY DAY  . buPROPion (WELLBUTRIN SR) 150 MG 12 hr tablet Take 1 tablet (150 mg total) by mouth 2 (two) times daily. (Patient not taking: Reported on 04/17/2017)  . [DISCONTINUED] amoxicillin-clavulanate (AUGMENTIN) 875-125 MG tablet Take 1 tablet by mouth 2 (two) times daily.  . [DISCONTINUED] SODIUM CHLORIDE, EXTERNAL, ([facility] [place]) 0.9 % SOLN Apply 2 application topically 2 (two) times daily.   No facility-administered encounter medications on file as of 04/17/2017.     Allergies (verified) Patient has no known allergies.   History: Past Medical History:  Diagnosis Date  . Depression    Past Surgical History:  Procedure Laterality Date  . CORONARY ANGIOPLASTY WITH STENT PLACEMENT  12/2011   [facility]  . US ECHOCARDIOGRAPHY  12/2011   Mild LV dysfunction, EF=45%   Family History  Problem Relation Age of Onset  . Cancer Mother   . Hypertension Mother   . Diabetes Father   . Heart disease Father      Social History   Occupational History  . Occupation: Disabled   Tobacco Use  . Smoking status: Current Every Day Smoker    Packs/day: 0.50    Years: 48.00    Pack years: 24.00    Types: Cigars  . Smokeless tobacco: Never Used  Substance and Sexual Activity  . Alcohol use: No    Alcohol/week: 0.0 oz  . Drug use: No  . Sexual activity: Not on file   Tobacco Counseling Ready to quit: No Counseling given: No   Activities of Daily Living In your present state of health, do you have any difficulty performing the following activities: 04/17/2017  Hearing? N  Vision? N  Difficulty concentrating or making decisions? N  Walking or climbing stairs? N  Dressing or bathing? N  Doing errands, shopping? N  Preparing Food and eating ? N  Using the Toilet? N  In the past six months, have you accidently leaked urine? N  Do you have problems with loss of bowel control? Y  Comment current issue with diarrhea x 3 months  Some recent data might be hidden    Immunizations and Health Maintenance Immunization History  Administered Date(s) Administered  . Influenza,inj,Quad PF,6+ Mos 05/12/2015, 04/17/2017  . Pneumococcal Polysaccharide-23 02/08/2012  . Tdap 11/15/2016   Health Maintenance Due  Topic Date Due  . Hepatitis C Screening  04/15/1956  . HIV Screening  01/14/1970    Patient Care Team: Birdie Sons,  MD as PCP - General (Family Medicine) Jacquelin Hawking, NP as Nurse Practitioner (Oncology)  Indicate any recent Medical Services you may have received from other than [facility] providers in the past year (date may be approximate).    Assessment:   This is a routine wellness examination for Michael Small.   Hearing/Vision screen Vision Screening Comments: Pt does not have regular vision checks. Last eye exam was > 5 years ago.  Dietary issues and exercise activities discussed: Current Exercise Habits: The patient does not participate in regular exercise at present(yardwork  only), Exercise limited by: None identified  Goals    . DIET - INCREASE WATER INTAKE     Recommend increasing water intake to 3-4 glasses a day.       Depression Screen PHQ 2/9 Scores 04/17/2017  PHQ - 2 Score 1    Fall Risk Fall Risk  04/17/2017  Falls in the past year? No    Cognitive Function: Pt declined screening today.         Screening Tests Health Maintenance  Topic Date Due  . Hepatitis C Screening  23-May-1956  . HIV Screening  01/14/1970  . Fecal DNA (Cologuard)  12/17/2019  . TETANUS/TDAP  11/16/2026  . INFLUENZA VACCINE  Completed        Plan:  I have personally reviewed and addressed the Medicare Annual Wellness questionnaire and have noted the following in the patient's chart:  A. Medical and social history B. Use of alcohol, tobacco or illicit drugs  C. Current medications and supplements D. Functional ability and status E.  Nutritional status F.  Physical activity G. Advance directives H. List of other physicians I.  Hospitalizations, surgeries, and ER visits in previous 12 months J.  [place] such as hearing and vision if needed, cognitive and depression L. Referrals and appointments - none  In addition, I have reviewed and discussed with patient certain preventive protocols, quality metrics, and best practice recommendations. A written personalized care plan for preventive services as well as general preventive health recommendations were provided to patient.  See attached scanned questionnaire for additional information.   Signed,  Fabio Neighbors, LPN Nurse Health Advisor   MD Recommendations: Follow up scheduled after this visit for ongoing diarrhea and weight loss. Pt declined the Hepatitis C and HIV screening today.

## 2017-04-17 NOTE — Patient Instructions (Signed)
Michael Small , Thank you for taking time to come for your Medicare Wellness Visit. I appreciate your ongoing commitment to your health goals. Please review the following plan we discussed and let me know if I can assist you in the future.   Screening recommendations/referrals: Colonoscopy: stool cards due again 11/2019 Recommended yearly ophthalmology/optometry visit for glaucoma screening and checkup Recommended yearly dental visit for hygiene and checkup  Vaccinations: Influenza vaccine: completed Pneumococcal vaccine: N/A Tdap vaccine: up to date Shingles vaccine: declined   Advanced directives: Advance directive discussed with you today. Even though you declined this today please call our office should you change your mind and we can give you the proper paperwork for you to fill out.  Conditions/risks identified: Smoking cessation; Recommend increasing water intake to 3-4 glasses a day.   Next appointment: 4:00 PM today  Preventive Care 40-64 Years, Male Preventive care refers to lifestyle choices and visits with your health care provider that can promote health and wellness. What does preventive care include?  A yearly physical exam. This is also called an annual well check.  Dental exams once or twice a year.  Routine eye exams. Ask your health care provider how often you should have your eyes checked.  Personal lifestyle choices, including:  Daily care of your teeth and gums.  Regular physical activity.  Eating a healthy diet.  Avoiding tobacco and drug use.  Limiting alcohol use.  Practicing safe sex.  Taking low-dose aspirin every day starting at age 54. What happens during an annual well check? The services and screenings done by your health care provider during your annual well check will depend on your age, overall health, lifestyle risk factors, and family history of disease. Counseling  Your health care provider may ask you questions about your:  Alcohol  use.  Tobacco use.  Drug use.  Emotional well-being.  Home and relationship well-being.  Sexual activity.  Eating habits.  Work and work Statistician. Screening  You may have the following tests or measurements:  Height, weight, and BMI.  Blood pressure.  Lipid and cholesterol levels. These may be checked every 5 years, or more frequently if you are over 45 years old.  Skin check.  Lung cancer screening. You may have this screening every year starting at age 45 if you have a 30-pack-year history of smoking and currently smoke or have quit within the past 15 years.  Fecal occult blood test (FOBT) of the stool. You may have this test every year starting at age 27.  Flexible sigmoidoscopy or colonoscopy. You may have a sigmoidoscopy every 5 years or a colonoscopy every 10 years starting at age 75.  Prostate cancer screening. Recommendations will vary depending on your family history and other risks.  Hepatitis C blood test.  Hepatitis B blood test.  Sexually transmitted disease (STD) testing.  Diabetes screening. This is done by checking your blood sugar (glucose) after you have not eaten for a while (fasting). You may have this done every 1-3 years. Discuss your test results, treatment options, and if necessary, the need for more tests with your health care provider. Vaccines  Your health care provider may recommend certain vaccines, such as:  Influenza vaccine. This is recommended every year.  Tetanus, diphtheria, and acellular pertussis (Tdap, Td) vaccine. You may need a Td booster every 10 years.  Zoster vaccine. You may need this after age 41.  Pneumococcal 13-valent conjugate (PCV13) vaccine. You may need this if you have certain conditions and  have not been vaccinated.  Pneumococcal polysaccharide (PPSV23) vaccine. You may need one or two doses if you smoke cigarettes or if you have certain conditions. Talk to your health care provider about which screenings  and vaccines you need and how often you need them. This information is not intended to replace advice given to you by your health care provider. Make sure you discuss any questions you have with your health care provider. Document Released: 06/12/2015 Document Revised: 02/03/2016 Document Reviewed: 03/17/2015 Elsevier Interactive Patient Education  2017 [place] Prevention in the Home Falls can cause injuries. They can happen to people of all ages. There are many things you can do to make your home safe and to help prevent falls. What can I do on the outside of my home?  Regularly fix the edges of walkways and driveways and fix any cracks.  Remove anything that might make you trip as you walk through a door, such as a raised step or threshold.  Trim any bushes or trees on the path to your home.  Use bright outdoor lighting.  Clear any walking paths of anything that might make someone trip, such as rocks or tools.  Regularly check to see if handrails are loose or broken. Make sure that both sides of any steps have handrails.  Any raised decks and porches should have guardrails on the edges.  Have any leaves, snow, or ice cleared regularly.  Use sand or salt on walking paths during winter.  Clean up any spills in your garage right away. This includes oil or grease spills. What can I do in the bathroom?  Use night lights.  Install grab bars by the toilet and in the tub and shower. Do not use towel bars as grab bars.  Use non-skid mats or decals in the tub or shower.  If you need to sit down in the shower, use a plastic, non-slip stool.  Keep the floor dry. Clean up any water that spills on the floor as soon as it happens.  Remove soap buildup in the tub or shower regularly.  Attach bath mats securely with double-sided non-slip rug tape.  Do not have throw rugs and other things on the floor that can make you trip. What can I do in the bedroom?  Use night  lights.  Make sure that you have a light by your bed that is easy to reach.  Do not use any sheets or blankets that are too big for your bed. They should not hang down onto the floor.  Have a firm chair that has side arms. You can use this for support while you get dressed.  Do not have throw rugs and other things on the floor that can make you trip. What can I do in the kitchen?  Clean up any spills right away.  Avoid walking on wet floors.  Keep items that you use a lot in easy-to-reach places.  If you need to reach something above you, use a strong step stool that has a grab bar.  Keep electrical cords out of the way.  Do not use floor polish or wax that makes floors slippery. If you must use wax, use non-skid floor wax.  Do not have throw rugs and other things on the floor that can make you trip. What can I do with my stairs?  Do not leave any items on the stairs.  Make sure that there are handrails on both sides of the stairs and  use them. Fix handrails that are broken or loose. Make sure that handrails are as long as the stairways.  Check any carpeting to make sure that it is firmly attached to the stairs. Fix any carpet that is loose or worn.  Avoid having throw rugs at the top or bottom of the stairs. If you do have throw rugs, attach them to the floor with carpet tape.  Make sure that you have a light switch at the top of the stairs and the bottom of the stairs. If you do not have them, ask someone to add them for you. What else can I do to help prevent falls?  Wear shoes that:  Do not have high heels.  Have rubber bottoms.  Are comfortable and fit you well.  Are closed at the toe. Do not wear sandals.  If you use a stepladder:  Make sure that it is fully opened. Do not climb a closed stepladder.  Make sure that both sides of the stepladder are locked into place.  Ask someone to hold it for you, if possible.  Clearly mark and make sure that you can  see:  Any grab bars or handrails.  First and last steps.  Where the edge of each step is.  Use tools that help you move around (mobility aids) if they are needed. These include:  Canes.  Walkers.  Scooters.  Crutches.  Turn on the lights when you go into a dark area. Replace any light bulbs as soon as they burn out.  Set up your furniture so you have a clear path. Avoid moving your furniture around.  If any of your floors are uneven, fix them.  If there are any pets around you, be aware of where they are.  Review your medicines with your doctor. Some medicines can make you feel dizzy. This can increase your chance of falling. Ask your doctor what other things that you can do to help prevent falls. This information is not intended to replace advice given to you by your health care provider. Make sure you discuss any questions you have with your health care provider. Document Released: 03/12/2009 Document Revised: 10/22/2015 Document Reviewed: 06/20/2014 Elsevier Interactive Patient Education  2017 [employer].

## 2017-04-17 NOTE — Progress Notes (Signed)
Patient: Michael Small Male    DOB: 04-Dec-1955   61 y.o.   MRN: [id number] Visit Date: 04/17/2017  Today's Provider: [name], MD   Chief Complaint  Patient presents with  . Diarrhea    x 3 months   Subjective:    Diarrhea   This is a recurrent problem. Episode onset: 3 months ago. The problem has been waxing and waning. Associated symptoms include abdominal pain (occasional abdominal cramps) and weight loss. Pertinent negatives include no chills, fever or vomiting.  Stool consistency is described as watery to a soft solid. Patient has had some bowel movement which he described resembles a black tar. Some days he has up to 12 bowel movements. Patient also has swelling in his right lower leg. He reports that he has to get up every 2 hours to urinate. No pain or burning with urination. He had negative Cologuard test in July.   Wt Readings from Last 5 Encounters:  04/17/17 175 lb 12.8 oz (79.7 kg)  11/29/16 181 lb (82.1 kg)  11/24/16 181 lb 3.2 oz (82.2 kg)  11/15/16 182 lb (82.6 kg)  08/13/15 202 lb (91.6 kg)     No Known Allergies   Current Outpatient Medications:  .  aspirin 81 MG tablet, Take 1 tablet by mouth daily., Disp: , Rfl:  .  atorvastatin (LIPITOR) 20 MG tablet, Take 1 tablet (20 mg total) by mouth daily. (Patient taking differently: Take 20 mg daily by mouth. ), Disp: 30 tablet, Rfl: 6 .  buPROPion (WELLBUTRIN SR) 150 MG 12 hr tablet, Take 1 tablet (150 mg total) by mouth 2 (two) times daily. (Patient not taking: Reported on 04/17/2017), Disp: 45 tablet, Rfl: 2 .  ibuprofen (ADVIL,MOTRIN) 200 MG tablet, Take 200 mg every 6 (six) hours as needed by mouth., Disp: , Rfl:  .  losartan-hydrochlorothiazide (HYZAAR) 100-25 MG tablet, TAKE 1 TABLET BY MOUTH EVERY DAY, Disp: 30 tablet, Rfl: 5  Review of Systems  Constitutional: Positive for weight loss. Negative for appetite change, chills and fever.  Respiratory: Negative for chest tightness, shortness of  breath and wheezing.   Cardiovascular: Positive for leg swelling (right lower leg). Negative for chest pain and palpitations.  Gastrointestinal: Positive for abdominal pain (occasional abdominal cramps), blood in stool, diarrhea and nausea. Negative for vomiting.  Endocrine: Positive for polyuria (at night).    Social History   Tobacco Use  . Smoking status: Current Every Day Smoker    Packs/day: 0.50    Years: 48.00    Pack years: 24.00    Types: Cigars  . Smokeless tobacco: Never Used  Substance Use Topics  . Alcohol use: No    Alcohol/week: 0.0 oz   Objective:   BP (!) 148/70 (BP Location: Right Arm, Patient Position: Sitting, Cuff Size: Large)  Vitals:   BP 146/68 Abnormal  (BP Location: Left Arm)   Pulse 100   Temp 98.7 F (37.1 C) (Oral)   Ht 5\' 11"  (1.803 m)   Wt 175 lb 12.8 oz (79.7 kg)   BMI 24.52 kg/m   BSA 2 m      Physical Exam  General Appearance:    Alert, cooperative, no distress  Eyes:    PERRL, conjunctiva/corneas clear, EOM's intact       Lungs:     Clear to auscultation bilaterally, respirations unlabored  Heart:    Regular rate and rhythm  Extremities:   1+ edema right   Abdomen:  bowel sounds present and normal in all 4 quadrants, soft, round or nondistended. No CVA tenderness. Slight diffuse abdominal tenderness.       Assessment & Plan:     1. Diarrhea, unspecified type  - COMPLETE METABOLIC PANEL WITH GFR - CBC - TSH - Fecal lactoferrin, quant - Ova and parasite examination - Stool culture - C. difficile GDH and Toxin A/B  2. Lymphedema of right lower extremity   3. Weight loss, abnormal  - Fecal lactoferrin, quant - Ova and parasite examination - Stool culture - C. difficile GDH and Toxin A/B       [name], MD  [facility]

## 2017-04-18 ENCOUNTER — Telehealth: Payer: Self-pay

## 2017-04-18 DIAGNOSIS — I890 Lymphedema, not elsewhere classified: Secondary | ICD-10-CM | POA: Diagnosis not present

## 2017-04-18 DIAGNOSIS — R634 Abnormal weight loss: Secondary | ICD-10-CM | POA: Diagnosis not present

## 2017-04-18 DIAGNOSIS — R197 Diarrhea, unspecified: Secondary | ICD-10-CM | POA: Diagnosis not present

## 2017-04-18 MED ORDER — CIPROFLOXACIN HCL 500 MG PO TABS: 500.0000 mg | ORAL_TABLET | Freq: Two times a day (BID) | ORAL | 0 refills | Status: AC

## 2017-04-18 MED ORDER — AMLODIPINE BESYLATE 5 MG PO TABS: 5.0000 mg | ORAL_TABLET | Freq: Every day | ORAL | 0 refills | Status: DC

## 2017-04-18 NOTE — Addendum Note (Signed)
Addended by: Birdie Sons on: 04/18/2017 03:56 PM   Modules accepted: Orders

## 2017-04-18 NOTE — Telephone Encounter (Signed)
Patient's wife advised as below. 

## 2017-04-18 NOTE — Telephone Encounter (Signed)
Also, please advise that I sent prescription [facility]. Some of his symptoms could be due to urinary tract or prostate infection. He should go ahead and start antibiotic and will check urinalysis when he comes in on Friday.

## 2017-04-18 NOTE — Telephone Encounter (Signed)
-----   Message from Birdie Sons, MD sent at 04/18/2017  7:55 AM EST ----- Kidney functions are much worse. Need to stop losartan-hctz. Do not take any OTC pain medication. Start amlodipine 5mg  once a day for blood pressure, #30, rf x 0. Drink 6-8 glasses of water a day. Follow up Friday for BP check and to check renal panel, sed rate, serum protein electrophoresis, ANA

## 2017-04-19 DIAGNOSIS — R634 Abnormal weight loss: Secondary | ICD-10-CM | POA: Diagnosis not present

## 2017-04-19 DIAGNOSIS — R197 Diarrhea, unspecified: Secondary | ICD-10-CM | POA: Diagnosis not present

## 2017-04-19 DIAGNOSIS — I890 Lymphedema, not elsewhere classified: Secondary | ICD-10-CM | POA: Diagnosis not present

## 2017-04-19 LAB — OVA AND PARASITE EXAMINATION
CONCENTRATE RESULT: NONE SEEN
MICRO NUMBER:: 81309662
SPECIMEN QUALITY:: ADEQUATE
TRICHROME RESULT: NONE SEEN

## 2017-04-19 NOTE — Telephone Encounter (Signed)
Pt's wife Parke Simmers was advised.

## 2017-04-21 ENCOUNTER — Encounter: Payer: Self-pay | Admitting: Family Medicine

## 2017-04-21 ENCOUNTER — Ambulatory Visit (INDEPENDENT_AMBULATORY_CARE_PROVIDER_SITE_OTHER): Payer: Medicare Other | Admitting: Family Medicine

## 2017-04-21 VITALS — BP 164/80 | HR 94 | Temp 97.5°F | Resp 18 | Wt 175.0 lb

## 2017-04-21 DIAGNOSIS — R197 Diarrhea, unspecified: Secondary | ICD-10-CM

## 2017-04-21 DIAGNOSIS — N179 Acute kidney failure, unspecified: Secondary | ICD-10-CM | POA: Diagnosis not present

## 2017-04-21 DIAGNOSIS — R351 Nocturia: Secondary | ICD-10-CM

## 2017-04-21 DIAGNOSIS — Z125 Encounter for screening for malignant neoplasm of prostate: Secondary | ICD-10-CM

## 2017-04-21 DIAGNOSIS — I2510 Atherosclerotic heart disease of native coronary artery without angina pectoris: Secondary | ICD-10-CM

## 2017-04-21 LAB — POCT URINALYSIS DIPSTICK
BILIRUBIN UA: NEGATIVE
GLUCOSE UA: NEGATIVE
Ketones, UA: NEGATIVE
Leukocytes, UA: NEGATIVE
NITRITE UA: NEGATIVE
PH UA: 6 (ref 5.0–8.0)
RBC UA: NEGATIVE
Spec Grav, UA: 1.015 (ref 1.010–1.025)
UROBILINOGEN UA: 0.2 U/dL

## 2017-04-21 LAB — FECAL LACTOFERRIN, QUANT
FECAL LACTOFERRIN: NEGATIVE
MICRO NUMBER:: 81318814
SPECIMEN QUALITY:: ADEQUATE

## 2017-04-21 MED ORDER — AMLODIPINE BESYLATE 5 MG PO TABS: 10.0000 mg | ORAL_TABLET | Freq: Every day | ORAL | 0 refills | Status: DC

## 2017-04-21 NOTE — Patient Instructions (Addendum)
   Start taking 2 x 5mg  tablets of amlodipine every day   Please go to the [facility] lab draw center in [address] on the second floor of [facility] on Monday

## 2017-04-21 NOTE — Progress Notes (Signed)
Patient: Michael Small Male    DOB: 19-May-1956   61 y.o.   MRN: [id number] Visit Date: 04/21/2017  Today's Provider: [name], MD   Chief Complaint  Patient presents with  . Hypertension   Subjective:    HPI  Hypertension, follow-up:  BP Readings from Last 3 Encounters:  04/21/17 (!) 164/80  04/17/17 (!) 148/70  04/17/17 (!) 146/68    He was last seen for hypertension 5 months ago.  BP at that visit was 148/70. Management since that visit includes stopped Losartan/HCTZ and started Amlodipine 5 mg due to kidney function. He reports good compliance with treatment. He is not having side effects.  He is exercising. He is adherent to low salt diet.   Outside blood pressures are 120-140's/pt does not remember diastolic numbers. Patient denies chest pain, chest pressure/discomfort, claudication, dyspnea, exertional chest pressure/discomfort, fatigue, irregular heart beat, lower extremity edema, near-syncope, orthopnea, palpitations, paroxysmal nocturnal dyspnea, syncope and tachypnea.   Cardiovascular risk factors include advanced age (older than 3 for men, 57 for women), hypertension and male gender.   Wt Readings from Last 3 Encounters:  04/21/17 175 lb (79.4 kg)  04/17/17 175 lb 12.8 oz (79.7 kg)  11/29/16 181 lb (82.1 kg)   ------------------------------------------------------------------------  He was seen on 04/17/2017 for having 8-10 daily BM and nocturia. Was sent for stool studies which have so far been negative, but culture is still pending. Labs found increased creatinine.  Lab Results  Component Value Date   NA 142 04/17/2017   K 4.4 04/17/2017   CL 108 04/17/2017   CO2 24 04/17/2017   GLUCOSE 86 04/17/2017   BUN 48 (H) 04/17/2017   CREATININE 3.22 (H) 04/17/2017   CALCIUM 9.6 04/17/2017   GFRNONAA 20 (L) 04/17/2017   GFRAA 23 (L) 04/17/2017   Started on empiric Cipro for possible prostatitis/hydronephrosis. He states BM have declined from  8-10 a day to 4-5 and not having to get up as much to urinate at night. We had him stop his benazepril-hctz and started amlodipine which he is tolerating well.        No Known Allergies   Current Outpatient Medications:  .  amLODipine (NORVASC) 5 MG tablet, Take 1 tablet (5 mg total) by mouth daily., Disp: 30 tablet, Rfl: 0 .  aspirin 81 MG tablet, Take 1 tablet by mouth daily., Disp: , Rfl:  .  ciprofloxacin (CIPRO) 500 MG tablet, Take 1 tablet (500 mg total) by mouth 2 (two) times daily for 7 days., Disp: 14 tablet, Rfl: 0 .  atorvastatin (LIPITOR) 20 MG tablet, Take 1 tablet (20 mg total) by mouth daily. (Patient not taking: Reported on 04/21/2017), Disp: 30 tablet, Rfl: 6 .  buPROPion (WELLBUTRIN SR) 150 MG 12 hr tablet, Take 1 tablet (150 mg total) by mouth 2 (two) times daily. (Patient not taking: Reported on 04/17/2017), Disp: 45 tablet, Rfl: 2 .  ibuprofen (ADVIL,MOTRIN) 200 MG tablet, Take 200 mg every 6 (six) hours as needed by mouth., Disp: , Rfl:   Review of Systems  Constitutional: Negative.   HENT: Negative.   Eyes: Negative.   Respiratory: Negative.   Cardiovascular: Negative.   Gastrointestinal: Positive for diarrhea.  Endocrine: Negative.   Genitourinary: Negative.   Musculoskeletal: Negative.   Skin: Negative.   Allergic/Immunologic: Negative.   Neurological: Negative.   Hematological: Negative.   Psychiatric/Behavioral: Negative.     Social History   Tobacco Use  . Smoking status: Current Every Day  Smoker    Packs/day: 0.50    Years: 48.00    Pack years: 24.00    Types: Cigars  . Smokeless tobacco: Never Used  Substance Use Topics  . Alcohol use: No    Alcohol/week: 0.0 oz   Objective:   BP (!) 164/80 (BP Location: Left Arm, Patient Position: Sitting, Cuff Size: Normal)   Pulse 94   Temp (!) 97.5 F (36.4 C) (Oral)   Resp 18   Wt 175 lb (79.4 kg)   SpO2 97%   BMI 24.41 kg/m  Vitals:   04/21/17 1147  BP: (!) 164/80  Pulse: 94  Resp: 18    Temp: (!) 97.5 F (36.4 C)  TempSrc: Oral  SpO2: 97%  Weight: 175 lb (79.4 kg)     Physical Exam  General appearance: alert, well developed, well nourished, cooperative and in no distress Head: Normocephalic, without obvious abnormality, atraumatic Respiratory: Respirations even and unlabored, normal respiratory rate Extremities: No gross deformities Skin: Skin color, texture, turgor normal. No rashes seen  Psych: Appropriate mood and affect. Neurologic: Mental status: Alert, oriented to person, place, and time, thought content appropriate.   Results for orders placed or performed in visit on 04/21/17  POCT urinalysis dipstick  Result Value Ref Range   Color, UA yellow    Clarity, UA clear    Glucose, UA neg    Bilirubin, UA neg    Ketones, UA neg    Spec Grav, UA 1.015 1.010 - 1.025   Blood, UA neg    pH, UA 6.0 5.0 - 8.0   Protein, UA trace    Urobilinogen, UA 0.2 0.2 or 1.0 E.U./dL   Nitrite, UA neg    Leukocytes, UA Negative Negative       Assessment & Plan:     1. Acute kidney injury ([place]) Lab is closed today, return on Monday, anticipated renal ultrasound if not significantly improved.  Stay off [place] for now, increase to 2x28m amlodipine for better BP control.  - Renal function panel - Sed Rate (ESR) - ANA,IFA RA Diag Pnl w/rflx Tit/Patn - Protein,Total and Electrophor w/IFE - POCT urinalysis dipstick  2. Diarrhea, unspecified type Frequent BM reduced by about half since last visit.   3. Nocturia Suspected incomplete bladder emptying, improved somewhat since starting Cipro.   4. Prostate cancer screening  - PSA       DLelon Huh MD  [facility]

## 2017-04-22 LAB — STOOL CULTURE
MICRO NUMBER:: 81309983
MICRO NUMBER:: 81309984
MICRO NUMBER:: 81309985
SHIGA RESULT:: NOT DETECTED
SPECIMEN QUALITY:: ADEQUATE
SPECIMEN QUALITY:: ADEQUATE
SPECIMEN QUALITY:: ADEQUATE

## 2017-04-24 DIAGNOSIS — N179 Acute kidney failure, unspecified: Secondary | ICD-10-CM | POA: Diagnosis not present

## 2017-04-24 DIAGNOSIS — Z125 Encounter for screening for malignant neoplasm of prostate: Secondary | ICD-10-CM | POA: Diagnosis not present

## 2017-04-25 ENCOUNTER — Other Ambulatory Visit: Payer: Self-pay | Admitting: *Deleted

## 2017-04-25 ENCOUNTER — Telehealth: Payer: Self-pay | Admitting: *Deleted

## 2017-04-25 DIAGNOSIS — R634 Abnormal weight loss: Secondary | ICD-10-CM

## 2017-04-25 DIAGNOSIS — R972 Elevated prostate specific antigen [PSA]: Secondary | ICD-10-CM

## 2017-04-25 LAB — PSA: PSA: 2338.8 ng/mL — AB (ref <=4.0)

## 2017-04-25 MED ORDER — DIPHENOXYLATE-ATROPINE 2.5-0.025 MG PO TABS: 1.0000 | ORAL_TABLET | Freq: Two times a day (BID) | ORAL | 0 refills | Status: DC | PRN

## 2017-04-25 NOTE — Telephone Encounter (Signed)
Parke Simmers wad advised.

## 2017-04-25 NOTE — Progress Notes (Unsigned)
Please schedule Ct abdomen/pelvis. Thanks!

## 2017-04-25 NOTE — Telephone Encounter (Signed)
Patient's wife Parke Simmers was notified of results. Parke Simmers stated patient is still having frequent bowel movements, around 20 day and night. Parke Simmers wanted to know if the stool tests that were done showed anything to cause frequent bowel movements? If not want would you recommend for the next step? Please advise? CT scan was ordered this morning.

## 2017-04-25 NOTE — Telephone Encounter (Signed)
-----   Message from Birdie Sons, MD sent at 04/25/2017  8:18 AM EST ----- Kidney functions slightly better. PSA (prstate antigen) evel is high. Need CT without contrast of abdomen/pelvis for weight loss and elevated PSA.  Need to schedule follow up o.v. Day after CT

## 2017-04-25 NOTE — Telephone Encounter (Signed)
Stool tests were all normal. Can try prescription for lomotil, have sent prescription to [facility]

## 2017-04-27 ENCOUNTER — Ambulatory Visit
Admission: RE | Admit: 2017-04-27 | Discharge: 2017-04-27 | Disposition: A | Discharge status: Home / Self Care | Payer: Medicare Other | Source: Ambulatory Visit | Attending: Family Medicine | Admitting: Family Medicine

## 2017-04-27 DIAGNOSIS — N1330 Unspecified hydronephrosis: Secondary | ICD-10-CM | POA: Insufficient documentation

## 2017-04-27 DIAGNOSIS — I2510 Atherosclerotic heart disease of native coronary artery without angina pectoris: Secondary | ICD-10-CM | POA: Insufficient documentation

## 2017-04-27 DIAGNOSIS — R590 Localized enlarged lymph nodes: Secondary | ICD-10-CM | POA: Diagnosis not present

## 2017-04-27 DIAGNOSIS — I700 Atherosclerosis of aorta: Secondary | ICD-10-CM | POA: Insufficient documentation

## 2017-04-27 DIAGNOSIS — R634 Abnormal weight loss: Secondary | ICD-10-CM | POA: Insufficient documentation

## 2017-04-27 DIAGNOSIS — N400 Enlarged prostate without lower urinary tract symptoms: Secondary | ICD-10-CM | POA: Insufficient documentation

## 2017-04-27 DIAGNOSIS — R972 Elevated prostate specific antigen [PSA]: Secondary | ICD-10-CM | POA: Insufficient documentation

## 2017-04-27 DIAGNOSIS — R918 Other nonspecific abnormal finding of lung field: Secondary | ICD-10-CM | POA: Diagnosis not present

## 2017-04-27 LAB — RENAL FUNCTION PANEL
Albumin: 4.2 g/dL (ref 3.6–5.1)
BUN/Creatinine Ratio: 15 (calc) (ref 6–22)
BUN: 44 mg/dL — ABNORMAL HIGH (ref 7–25)
CALCIUM: 9.4 mg/dL (ref 8.6–10.3)
CHLORIDE: 111 mmol/L — AB (ref 98–110)
CO2: 25 mmol/L (ref 20–32)
Creat: 2.95 mg/dL — ABNORMAL HIGH (ref 0.70–1.25)
GLUCOSE: 102 mg/dL — AB (ref 65–99)
POTASSIUM: 4.5 mmol/L (ref 3.5–5.3)
Phosphorus: 3.8 mg/dL (ref 2.5–4.5)
SODIUM: 143 mmol/L (ref 135–146)

## 2017-04-27 LAB — PROTEIN,TOTAL AND ELECTROPHOR W/IFE
ALBUMIN ELP: 3.9 g/dL (ref 3.8–4.8)
ALPHA 1: 0.4 g/dL — AB (ref 0.2–0.3)
ALPHA 2: 1 g/dL — AB (ref 0.5–0.9)
BETA GLOBULIN: 0.3 g/dL — AB (ref 0.4–0.6)
Beta 2: 0.3 g/dL (ref 0.2–0.5)
Gamma Globulin: 0.8 g/dL (ref 0.8–1.7)
TOTAL PROTEIN: 6.8 g/dL (ref 6.1–8.1)

## 2017-04-27 LAB — ANA,IFA RA DIAG PNL W/RFLX TIT/PATN
ANA: NEGATIVE
Cyclic Citrullin Peptide Ab: <16 UNITS

## 2017-04-27 LAB — SEDIMENTATION RATE: Sed Rate: 38 mm/h — ABNORMAL HIGH (ref 0–20)

## 2017-04-28 ENCOUNTER — Ambulatory Visit (INDEPENDENT_AMBULATORY_CARE_PROVIDER_SITE_OTHER): Payer: Medicare Other | Admitting: Family Medicine

## 2017-04-28 ENCOUNTER — Encounter: Payer: Self-pay | Admitting: Family Medicine

## 2017-04-28 VITALS — BP 142/62 | HR 84 | Temp 98.4°F | Resp 18 | Wt 176.0 lb

## 2017-04-28 DIAGNOSIS — R972 Elevated prostate specific antigen [PSA]: Secondary | ICD-10-CM | POA: Diagnosis not present

## 2017-04-28 DIAGNOSIS — R590 Localized enlarged lymph nodes: Secondary | ICD-10-CM | POA: Diagnosis not present

## 2017-04-28 DIAGNOSIS — N1330 Unspecified hydronephrosis: Secondary | ICD-10-CM | POA: Diagnosis not present

## 2017-04-28 DIAGNOSIS — R197 Diarrhea, unspecified: Secondary | ICD-10-CM | POA: Diagnosis not present

## 2017-04-28 DIAGNOSIS — I2510 Atherosclerotic heart disease of native coronary artery without angina pectoris: Secondary | ICD-10-CM

## 2017-04-28 DIAGNOSIS — N400 Enlarged prostate without lower urinary tract symptoms: Secondary | ICD-10-CM | POA: Diagnosis not present

## 2017-04-28 MED ORDER — TAMSULOSIN HCL 0.4 MG PO CAPS: 0.4000 mg | ORAL_CAPSULE | Freq: Every day | ORAL | 3 refills | Status: DC

## 2017-04-28 MED ORDER — CIPROFLOXACIN HCL 500 MG PO TABS: 500.0000 mg | ORAL_TABLET | Freq: Two times a day (BID) | ORAL | 0 refills | Status: AC

## 2017-04-28 NOTE — Progress Notes (Signed)
Patient: Michael Small Male    DOB: Oct 09, 1955   61 y.o.   MRN: [id number] Visit Date: 04/28/2017  Today's Provider: [name], MD   Chief Complaint  Patient presents with  . Follow-up   Subjective:    HPI Follow up:  Patient is her to follow up for persistent diarrhea and weight loss for which he first seen on 04/17/2017. Initial evaluation wa remarkable for elevated creatine of 3.22 up from his baseline of 1.55. ACEI was stopped, amlodipine was started and he was started on empiric ciprofloxacin which he took for 7 days. Stool studies were negative. He has never had colonoscopy, but had negative Cologuard July 2018  He returned 11/23 and had negative u/a and urine cultures. Follow up labs revealed extremely high PSA prompting order for abdominal CT.  Lab Results  Component Value Date   PSA 2,338.8 (H) 04/24/2017   He continues to have worsening diarrhea so we called in Lomotil on 11/27. He states he feels much better since starting Lomotil. BM have returned to normal, his appetite and energy levels have greatly improved. He denies any abdominal or back pain. No fevers,chills or sweats. Has a little bit of trouble with urinary hesitancy.   Abdominal CT was done yesterday with extensive abnormalities concerning for colon cancer.    Ct Abdomen Pelvis Wo Contrast  Result Date: 04/28/2017 IMPRESSION: 1. Prominent pelvic adenopathy also with pathologic retroperitoneal adenopathy, abnormal retroperitoneal stranding, and abnormal inflammatory type stranding along the pelvic sidewalls and perirectal space. Abnormal adenopathy in the perirectal space with prominent wall thickening in the rectum and moderate wall thickening in the sigmoid colon. The findings along the pelvic sidewalls appear to be causing distal ureteral obstruction resulting in moderate bilateral hydronephrosis and hydroureter. Lymphoma or prostate cancer is favored. Although a large metastatic rectal tumor could  have this appearance, by report the patient has a recent negative Cologuard test which generally is thought to have a good negative predictive value. It would be unusual for prostatitis to cause the degree of adenopathy we see today, for example with a right external iliac node measuring 3.2 cm. Also although retroperitoneal fibrosis can cause stranding and ureteral obstruction, the appearance of adenopathy would be an unusual feature. 2. Bladder wall thickening suggesting cystitis. 3. Prostatomegaly. 4. Aortic Atherosclerosis (ICD10-I70.0). Right coronary artery atherosclerosis. 5. Stable right basilar nodularity. Electronically Signed   By: Van Clines M.D.   On: 04/28/2017 08:09      Patient is here to discuss the results from his CT pelvis scan done on 04/27/2017.  Wt Readings from Last 3 Encounters:  04/28/17 176 lb (79.8 kg)  04/21/17 175 lb (79.4 kg)  04/17/17 175 lb 12.8 oz (79.7 kg)    No Known Allergies   Current Outpatient Medications:  .  amLODipine (NORVASC) 5 MG tablet, Take 2 tablets (10 mg total) by mouth daily., Disp: 1 tablet, Rfl: 0 .  aspirin 81 MG tablet, Take 1 tablet by mouth daily., Disp: , Rfl:  .  atorvastatin (LIPITOR) 20 MG tablet, Take 1 tablet (20 mg total) by mouth daily., Disp: 30 tablet, Rfl: 6 .  buPROPion (WELLBUTRIN SR) 150 MG 12 hr tablet, Take 1 tablet (150 mg total) by mouth 2 (two) times daily., Disp: 45 tablet, Rfl: 2 .  diphenoxylate-atropine (LOMOTIL) 2.5-0.025 MG tablet, Take 1 tablet by mouth 2 (two) times daily as needed for diarrhea or loose stools., Disp: 12 tablet, Rfl: 0 .  ibuprofen (ADVIL,MOTRIN)  200 MG tablet, Take 200 mg every 6 (six) hours as needed by mouth., Disp: , Rfl:   Review of Systems  Constitutional: Negative for appetite change, chills and fever.  Respiratory: Negative for chest tightness, shortness of breath and wheezing.   Cardiovascular: Negative for chest pain and palpitations.  Gastrointestinal: Negative for  abdominal pain, nausea and vomiting.    Social History   Tobacco Use  . Smoking status: Current Every Day Smoker    Packs/day: 0.50    Years: 48.00    Pack years: 24.00    Types: Cigars  . Smokeless tobacco: Never Used  Substance Use Topics  . Alcohol use: No    Alcohol/week: 0.0 oz   Objective:   BP (!) 142/62 (BP Location: Left Arm, Patient Position: Sitting, Cuff Size: Normal)   Pulse 84   Temp 98.4 F (36.9 C) (Oral)   Resp 18   Wt 176 lb (79.8 kg)   SpO2 98% Comment: room air  BMI 24.55 kg/m  There were no vitals filed for this visit.   Physical Exam  General Appearance:    Alert, cooperative, no distress  Eyes:    PERRL, conjunctiva/corneas clear, EOM's intact       Lungs:     Clear to auscultation bilaterally, respirations unlabored  Heart:    Regular rate and rhythm  Abdomen:   bowel sounds present and normal in all 4 quadrants, soft, round, nontender or nondistended. No CVA tenderness        Assessment & Plan:     1. Hydronephrosis, unspecified hydronephrosis type  - Ambulatory referral to Urology  2. Enlarged prostate Highly suspicious for prostate cancer. Prostatitis/cystitis possible, but unlikely.  Will cover with ciprofloxacin and tamsulosin while awaiting evaluation by urology.  - Ambulatory referral to Urology  3. Abdominal lymphadenopathy  - Ambulatory referral to Urology  4. High prostate specific antigen (PSA)  - Ambulatory referral to Urology  5. Diarrhea, unspecified type Greatly improved since starting Lomotil, counseled on potential for constipation or bowel obstruction and to reduce to QD, then prn as able.        [name], MD  [facility]

## 2017-05-04 ENCOUNTER — Other Ambulatory Visit: Payer: Self-pay | Admitting: Radiology

## 2017-05-04 ENCOUNTER — Other Ambulatory Visit: Payer: Self-pay | Admitting: Urology

## 2017-05-04 ENCOUNTER — Encounter: Payer: Self-pay | Admitting: Urology

## 2017-05-04 ENCOUNTER — Other Ambulatory Visit: Payer: Self-pay | Admitting: Family Medicine

## 2017-05-04 ENCOUNTER — Ambulatory Visit (INDEPENDENT_AMBULATORY_CARE_PROVIDER_SITE_OTHER): Payer: Medicare Other | Admitting: Urology

## 2017-05-04 VITALS — BP 175/77 | HR 114 | Ht 71.0 in | Wt 174.3 lb

## 2017-05-04 DIAGNOSIS — N1330 Unspecified hydronephrosis: Secondary | ICD-10-CM | POA: Diagnosis not present

## 2017-05-04 DIAGNOSIS — I2510 Atherosclerotic heart disease of native coronary artery without angina pectoris: Secondary | ICD-10-CM

## 2017-05-04 DIAGNOSIS — R972 Elevated prostate specific antigen [PSA]: Secondary | ICD-10-CM

## 2017-05-04 NOTE — Telephone Encounter (Signed)
Pharmacy requesting refills. Thanks!  

## 2017-05-04 NOTE — H&P (View-Only) (Signed)
05/04/2017 1:10 PM   Michael Small [birth year] [id number]  Referring provider: Birdie Sons, [place], [place] [address]  Chief Complaint  Patient presents with  . Hydronephrosis    HPI: The patient is a 61 year old gentleman who presents today for evaluation of an extremely elevated PSA as well as bilateral hydroureteronephrosis.  The patient has a PSA of 2338.8.  Is also found to have bilateral hydroureteronephrosis to the level of the bladder.  He has renal deficiency with a creatinine of 2.95 up from baseline of 1.5 approximately 1 year ago.  CT abdomen without contrast also shows extensive abdominal lymphadenopathy.  Patient also notes unexplained weight loss of 25 pounds over the last year.  At this point, denies any urinary issues.  He feels this appointment is to talk about his bladder not emptying.  He feels that his bladder does not empty well.  He has no dysuria.  He has a good stream.  He is unaware of his elevated PSA and hydronephrosis   PMH: Past Medical History:  Diagnosis Date  . Depression   . Hyperlipidemia   . Hypertension     Surgical History: Past Surgical History:  Procedure Laterality Date  . CORONARY ANGIOPLASTY WITH STENT PLACEMENT  12/2011   [facility]  . US ECHOCARDIOGRAPHY  12/2011   Mild LV dysfunction, EF=45%    Home Medications:  Allergies as of 05/04/2017   No Known Allergies     Medication List        Accurate as of 05/04/17  1:10 PM. Always use your most recent med list.          amLODipine 5 MG tablet Commonly known as:  NORVASC Take 2 tablets (10 mg total) by mouth daily.   aspirin 81 MG tablet Take 1 tablet by mouth daily.   atorvastatin 20 MG tablet Commonly known as:  LIPITOR Take 1 tablet (20 mg total) by mouth daily.   buPROPion 150 MG 12 hr tablet Commonly known as:  WELLBUTRIN SR Take 1 tablet (150 mg total) by mouth 2 (two) times daily.   ciprofloxacin 500 MG  tablet Commonly known as:  CIPRO Take 1 tablet (500 mg total) by mouth 2 (two) times daily for 7 days.   diphenoxylate-atropine 2.5-0.025 MG tablet Commonly known as:  LOMOTIL Take 1 tablet by mouth 2 (two) times daily as needed for diarrhea or loose stools.   ibuprofen 200 MG tablet Commonly known as:  ADVIL,MOTRIN Take 200 mg every 6 (six) hours as needed by mouth.   tamsulosin 0.4 MG Caps capsule Commonly known as:  FLOMAX Take 1 capsule (0.4 mg total) by mouth daily.       Allergies: No Known Allergies  Family History: Family History  Problem Relation Age of Onset  . Cancer Mother   . Hypertension Mother   . Diabetes Father   . Heart disease Father   . Prostate cancer Neg Hx   . Bladder Cancer Neg Hx   . Kidney cancer Neg Hx     Social History:  reports that he has been smoking cigars.  He has a 24.00 pack-year smoking history. he has never used smokeless tobacco. He reports that he does not drink alcohol or use drugs.  ROS: UROLOGY Frequent Urination?: Yes Hard to postpone urination?: No Burning/pain with urination?: No Get up at night to urinate?: Yes Leakage of urine?: No Urine stream starts and stops?: Yes Trouble starting stream?: Yes Do  you have to strain to urinate?: No Blood in urine?: No Urinary tract infection?: No Sexually transmitted disease?: No Injury to kidneys or bladder?: No Painful intercourse?: No Weak stream?: Yes Erection problems?: No Penile pain?: No  Gastrointestinal Nausea?: No Vomiting?: No Indigestion/heartburn?: Yes Diarrhea?: Yes Constipation?: No  Constitutional Fever: No Night sweats?: No Weight loss?: No Fatigue?: No  Skin Skin rash/lesions?: No Itching?: No  Eyes Blurred vision?: No Double vision?: No  Ears/Nose/Throat Sore throat?: No Sinus problems?: Yes  Hematologic/Lymphatic Swollen glands?: No Easy bruising?: No  Cardiovascular Leg swelling?: Yes Chest pain?: No  Respiratory Cough?:  Yes Shortness of breath?: No  Endocrine Excessive thirst?: Yes  Musculoskeletal Back pain?: Yes Joint pain?: No  Neurological Headaches?: Yes Dizziness?: No  Psychologic Depression?: No Anxiety?: No  Physical Exam: BP (!) 175/77 (BP Location: Right Arm, Patient Position: Sitting, Cuff Size: Normal)   Pulse (!) 114   Ht 5\' 11"  (1.803 m)   Wt 174 lb 4.8 oz (79.1 kg)   BMI 24.31 kg/m   Constitutional:  Alert and oriented, No acute distress. HEENT: [place] AT, moist mucus membranes.  Trachea midline, no masses. Cardiovascular: No clubbing, cyanosis, or edema. Respiratory: Normal respiratory effort, no increased work of breathing. GI: Abdomen is soft, nontender, nondistended, no abdominal masses GU: No CVA tenderness.  Normal phallus.  Testicles equal bilaterally.  Benign.  DRE: 2+.  Prostate is diffusely hard and indurated consistent with significant disease burden.  There is also suggestion of possible rectal involvement as the rectum feels fixed to the prostate. Skin: No rashes, bruises or suspicious lesions. Lymph: No cervical or inguinal adenopathy. Neurologic: Grossly intact, no focal deficits, moving all 4 extremities. Psychiatric: Normal mood and affect.  Laboratory Data: Lab Results  Component Value Date   WBC 8.9 04/17/2017   HGB 13.1 (L) 04/17/2017   HCT 37.8 (L) 04/17/2017   MCV 90.6 04/17/2017   PLT 209 04/17/2017    Lab Results  Component Value Date   CREATININE 2.95 (H) 04/24/2017    Lab Results  Component Value Date   PSA 2,338.8 (H) 04/24/2017    No results found for: TESTOSTERONE  Lab Results  Component Value Date   HGBA1C 5.8 (H) 11/15/2016    Urinalysis    Component Value Date/Time   BILIRUBINUR neg 04/21/2017 1223   PROTEINUR trace 04/21/2017 1223   UROBILINOGEN 0.2 04/21/2017 1223   NITRITE neg 04/21/2017 1223   LEUKOCYTESUR Negative 04/21/2017 1223    Pertinent Imaging: CT of the abdomen without contrast reviewed personally  showing bilateral hydroureteronephrosis to the level of the diffuse pelvic or abdominal lymphadenopathy bladder as well as  Assessment & Plan:  1. 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  Elevated PSA 3.  Bilateral hydroureteronephrosis I discussed the patient given his CT findings as well as his PSA that he has significant metastatic prostate cancer until proven otherwise.  We discussed the next step would be to confirm the presence of this via prostate biopsy.  We discussed the risk, benefits, indications of this.  He understands the risks include bleeding and infection.  He knows to expect blood in his stool and urine for up to 48 hours and in his semen for up to 6 weeks.  Also stressed the risk of infection despite perioperative antibiotics of about 1% requiring admission for sepsis.  We did discuss the nature of his disease.  He will likely need a combination of lifelong androgen deprivation therapy and chemotherapy.  We do however need to confirm  his disease with tissue first.  He will also need a CT of his chest and bone scan once we have a pathological diagnosis.  In regard to his bilateral hydroureteronephrosis.  We did discuss that this is the cause of his renal deficiency until proven otherwise.  We did discuss the next step would be urinary diversion.  We discussed ways to do this which include nephrostomy tubes and bilateral ureteral stents.  We discussed there is a good chance of bilateral ureteral stents would not be an option for him if his ureters are able to be identified in his bladder which is quite possible.  He does understand that he is at high risk for needing nephrostomy tubes with attempts at antegrade ureteral stent placement in the future.  Nickie Retort, MD  [facility] [address], [place], [place] [address] [phone]

## 2017-05-04 NOTE — Progress Notes (Signed)
05/04/2017 1:10 PM   Dan Maker 1955-12-19 [id number]  Referring provider: Birdie Sons, [place] [address], [place] [address]  Chief Complaint  Patient presents with  . Hydronephrosis    HPI: The patient is a 61 year old gentleman who presents today for evaluation of an extremely elevated PSA as well as bilateral hydroureteronephrosis.  The patient has a PSA of 2338.8.  Is also found to have bilateral hydroureteronephrosis to the level of the bladder.  He has renal deficiency with a creatinine of 2.95 up from baseline of 1.5 approximately 1 year ago.  CT abdomen without contrast also shows extensive abdominal lymphadenopathy.  Patient also notes unexplained weight loss of 25 pounds over the last year.  At this point, denies any urinary issues.  He feels this appointment is to talk about his bladder not emptying.  He feels that his bladder does not empty well.  He has no dysuria.  He has a good stream.  He is unaware of his elevated PSA and hydronephrosis   PMH: Past Medical History:  Diagnosis Date  . Depression   . Hyperlipidemia   . Hypertension     Surgical History: Past Surgical History:  Procedure Laterality Date  . CORONARY ANGIOPLASTY WITH STENT PLACEMENT  12/2011   [facility]  . US ECHOCARDIOGRAPHY  12/2011   Mild LV dysfunction, EF=45%    Home Medications:  Allergies as of 05/04/2017   No Known Allergies     Medication List        Accurate as of 05/04/17  1:10 PM. Always use your most recent med list.          amLODipine 5 MG tablet Commonly known as:  NORVASC Take 2 tablets (10 mg total) by mouth daily.   aspirin 81 MG tablet Take 1 tablet by mouth daily.   atorvastatin 20 MG tablet Commonly known as:  LIPITOR Take 1 tablet (20 mg total) by mouth daily.   buPROPion 150 MG 12 hr tablet Commonly known as:  WELLBUTRIN SR Take 1 tablet (150 mg total) by mouth 2 (two) times daily.   ciprofloxacin 500 MG  tablet Commonly known as:  CIPRO Take 1 tablet (500 mg total) by mouth 2 (two) times daily for 7 days.   diphenoxylate-atropine 2.5-0.025 MG tablet Commonly known as:  LOMOTIL Take 1 tablet by mouth 2 (two) times daily as needed for diarrhea or loose stools.   ibuprofen 200 MG tablet Commonly known as:  ADVIL,MOTRIN Take 200 mg every 6 (six) hours as needed by mouth.   tamsulosin 0.4 MG Caps capsule Commonly known as:  FLOMAX Take 1 capsule (0.4 mg total) by mouth daily.       Allergies: No Known Allergies  Family History: Family History  Problem Relation Age of Onset  . Cancer Mother   . Hypertension Mother   . Diabetes Father   . Heart disease Father   . Prostate cancer Neg Hx   . Bladder Cancer Neg Hx   . Kidney cancer Neg Hx     Social History:  reports that he has been smoking cigars.  He has a 24.00 pack-year smoking history. he has never used smokeless tobacco. He reports that he does not drink alcohol or use drugs.  ROS: UROLOGY Frequent Urination?: Yes Hard to postpone urination?: No Burning/pain with urination?: No Get up at night to urinate?: Yes Leakage of urine?: No Urine stream starts and stops?: Yes Trouble starting stream?: Yes Do  you have to strain to urinate?: No Blood in urine?: No Urinary tract infection?: No Sexually transmitted disease?: No Injury to kidneys or bladder?: No Painful intercourse?: No Weak stream?: Yes Erection problems?: No Penile pain?: No  Gastrointestinal Nausea?: No Vomiting?: No Indigestion/heartburn?: Yes Diarrhea?: Yes Constipation?: No  Constitutional Fever: No Night sweats?: No Weight loss?: No Fatigue?: No  Skin Skin rash/lesions?: No Itching?: No  Eyes Blurred vision?: No Double vision?: No  Ears/Nose/Throat Sore throat?: No Sinus problems?: Yes  Hematologic/Lymphatic Swollen glands?: No Easy bruising?: No  Cardiovascular Leg swelling?: Yes Chest pain?: No  Respiratory Cough?:  Yes Shortness of breath?: No  Endocrine Excessive thirst?: Yes  Musculoskeletal Back pain?: Yes Joint pain?: No  Neurological Headaches?: Yes Dizziness?: No  Psychologic Depression?: No Anxiety?: No  Physical Exam: BP (!) 175/77 (BP Location: Right Arm, Patient Position: Sitting, Cuff Size: Normal)   Pulse (!) 114   Ht 5\' 11"  (1.803 m)   Wt 174 lb 4.8 oz (79.1 kg)   BMI 24.31 kg/m   Constitutional:  Alert and oriented, No acute distress. HEENT: [place] AT, moist mucus membranes.  Trachea midline, no masses. Cardiovascular: No clubbing, cyanosis, or edema. Respiratory: Normal respiratory effort, no increased work of breathing. GI: Abdomen is soft, nontender, nondistended, no abdominal masses GU: No CVA tenderness.  Normal phallus.  Testicles equal bilaterally.  Benign.  DRE: 2+.  Prostate is diffusely hard and indurated consistent with significant disease burden.  There is also suggestion of possible rectal involvement as the rectum feels fixed to the prostate. Skin: No rashes, bruises or suspicious lesions. Lymph: No cervical or inguinal adenopathy. Neurologic: Grossly intact, no focal deficits, moving all 4 extremities. Psychiatric: Normal mood and affect.  Laboratory Data: Lab Results  Component Value Date   WBC 8.9 04/17/2017   HGB 13.1 (L) 04/17/2017   HCT 37.8 (L) 04/17/2017   MCV 90.6 04/17/2017   PLT 209 04/17/2017    Lab Results  Component Value Date   CREATININE 2.95 (H) 04/24/2017    Lab Results  Component Value Date   PSA 2,338.8 (H) 04/24/2017    No results found for: TESTOSTERONE  Lab Results  Component Value Date   HGBA1C 5.8 (H) 11/15/2016    Urinalysis    Component Value Date/Time   BILIRUBINUR neg 04/21/2017 1223   PROTEINUR trace 04/21/2017 1223   UROBILINOGEN 0.2 04/21/2017 1223   NITRITE neg 04/21/2017 1223   LEUKOCYTESUR Negative 04/21/2017 1223    Pertinent Imaging: CT of the abdomen without contrast reviewed personally  showing bilateral hydroureteronephrosis to the level of the diffuse pelvic or abdominal lymphadenopathy bladder as well as  Assessment & Plan:  1. 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  Elevated PSA 3.  Bilateral hydroureteronephrosis I discussed the patient given his CT findings as well as his PSA that he has significant metastatic prostate cancer until proven otherwise.  We discussed the next step would be to confirm the presence of this via prostate biopsy.  We discussed the risk, benefits, indications of this.  He understands the risks include bleeding and infection.  He knows to expect blood in his stool and urine for up to 48 hours and in his semen for up to 6 weeks.  Also stressed the risk of infection despite perioperative antibiotics of about 1% requiring admission for sepsis.  We did discuss the nature of his disease.  He will likely need a combination of lifelong androgen deprivation therapy and chemotherapy.  We do however need to confirm  his disease with tissue first.  He will also need a CT of his chest and bone scan once we have a pathological diagnosis.  In regard to his bilateral hydroureteronephrosis.  We did discuss that this is the cause of his renal deficiency until proven otherwise.  We did discuss the next step would be urinary diversion.  We discussed ways to do this which include nephrostomy tubes and bilateral ureteral stents.  We discussed there is a good chance of bilateral ureteral stents would not be an option for him if his ureters are able to be identified in his bladder which is quite possible.  He does understand that he is at high risk for needing nephrostomy tubes with attempts at antegrade ureteral stent placement in the future.  Nickie Retort, MD  [facility] [address] [place], [place] [address] [phone]

## 2017-05-05 LAB — URINALYSIS, COMPLETE
Bilirubin, UA: NEGATIVE
GLUCOSE, UA: NEGATIVE
KETONES UA: NEGATIVE
Leukocytes, UA: NEGATIVE
NITRITE UA: NEGATIVE
PROTEIN UA: NEGATIVE
RBC, UA: NEGATIVE
SPEC GRAV UA: 1.01 (ref 1.005–1.030)
UUROB: 0.2 mg/dL (ref 0.2–1.0)
pH, UA: 5.5 (ref 5.0–7.5)

## 2017-05-05 LAB — MICROSCOPIC EXAMINATION
Bacteria, UA: NONE SEEN
EPITHELIAL CELLS (NON RENAL): NONE SEEN /HPF (ref 0–10)
RBC MICROSCOPIC, UA: NONE SEEN /HPF (ref 0–?)

## 2017-05-07 LAB — CULTURE, URINE COMPREHENSIVE

## 2017-05-09 ENCOUNTER — Encounter: Payer: Self-pay | Admitting: Family Medicine

## 2017-05-09 MED ORDER — HYDROCODONE-ACETAMINOPHEN 5-325 MG PO TABS: 1.0000 | ORAL_TABLET | Freq: Four times a day (QID) | ORAL | 0 refills | Status: DC | PRN

## 2017-05-16 ENCOUNTER — Other Ambulatory Visit: Payer: Self-pay

## 2017-05-16 ENCOUNTER — Encounter
Admission: RE | Admit: 2017-05-16 | Discharge: 2017-05-16 | Disposition: A | Discharge status: Home / Self Care | Payer: Medicare Other | Source: Ambulatory Visit | Attending: Urology | Admitting: Urology

## 2017-05-16 DIAGNOSIS — R9431 Abnormal electrocardiogram [ECG] [EKG]: Secondary | ICD-10-CM | POA: Diagnosis not present

## 2017-05-16 DIAGNOSIS — Z01818 Encounter for other preprocedural examination: Secondary | ICD-10-CM | POA: Insufficient documentation

## 2017-05-16 DIAGNOSIS — I10 Essential (primary) hypertension: Secondary | ICD-10-CM | POA: Diagnosis not present

## 2017-05-16 HISTORY — DX: Acute myocardial infarction, unspecified: I21.9

## 2017-05-16 LAB — URINALYSIS, ROUTINE W REFLEX MICROSCOPIC
BILIRUBIN URINE: NEGATIVE
GLUCOSE, UA: NEGATIVE mg/dL
HGB URINE DIPSTICK: NEGATIVE
Ketones, ur: NEGATIVE mg/dL
Leukocytes, UA: NEGATIVE
Nitrite: NEGATIVE
Protein, ur: NEGATIVE mg/dL
SPECIFIC GRAVITY, URINE: 1.011 (ref 1.005–1.030)
pH: 5 (ref 5.0–8.0)

## 2017-05-16 NOTE — Patient Instructions (Signed)
Your procedure is scheduled on:Wed. 12/26 Report to Day Surgery. To find out your arrival time please call [phone] between 1PM - 3PM on Mon 05/22/17.  Remember: Instructions that are not followed completely may result in serious medical risk, up to and including death, or upon the discretion of your surgeon and anesthesiologist your surgery may need to be rescheduled.     _X__ 1. Do not eat food after midnight the night before your procedure.                 No gum chewing or hard candies. You may drink clear liquids up to 2 hours                 before you are scheduled to arrive for your surgery- DO not drink clear                 liquids within 2 hours of the start of your surgery.                 Clear Liquids include:  water, apple juice without pulp, clear carbohydrate                 drink such as Clearfast of [place]art[place], Black Coffee or Tea (Do not add                 anything to coffee or tea).     ___ 2.  No Alcohol for 24 hours before or after surgery.   _X__ 3.  Do Not Smoke or use e-cigarettes For 24 Hours Prior to Your Surgery.                 Do not use any chewable tobacco products for at least 6 hours prior to                 surgery.  ____  4.  Bring all medications with you on the day of surgery if instructed.   __x__  5.  Notify your doctor if there is any change in your medical condition      (cold, fever, infections).     Do not wear jewelry, make-up, hairpins, clips or nail polish. Do not wear lotions, powders, or perfumes. You may wear deodorant. Do not shave 48 hours prior to surgery. Men may shave face and neck. Do not bring valuables to the hospital.    [facility] is not responsible for any belongings or valuables.  Contacts, dentures or bridgework may not be worn into surgery. Leave your suitcase in the car. After surgery it may be brought to your room. For patients admitted to the hospital, discharge time is determined by  your treatment team.   Patients discharged the day of surgery will not be allowed to drive home.   Please read over the following fact sheets that you were given:    __x__ Take these medicines the morning of surgery with A SIP OF WATER:    1. amLODipine (NORVASC) 5 MG tablet  2. tamsulosin (FLOMAX) 0.4 MG CAPS capsule  3. HYDROcodone-acetaminophen (NORCO/VICODIN) 5-325 MG tablet if needed  4.diphenoxylate-atropine (LOMOTIL) 2.5-0.025 MG tablet if needed  5.  6.  ____ Fleet Enema (as directed)   ____ Use CHG Soap as directed  ____ Use inhalers on the day of surgery  ____ Stop metformin 2 days prior to surgery    ____ Take 1/2 of usual insulin dose the night before surgery. No insulin the morning  of surgery.   __x__ Stop aspirin on today  __x__ Stop Anti-inflammatories on (already stopped)   ____ Stop supplements until after surgery.    ____ Bring C-Pap to the hospital.

## 2017-05-17 ENCOUNTER — Ambulatory Visit: Payer: Medicare Other | Admitting: Family Medicine

## 2017-05-17 LAB — URINE CULTURE: Culture: <10000 — AB

## 2017-05-17 NOTE — Telephone Encounter (Signed)
Visit complete.

## 2017-05-18 ENCOUNTER — Other Ambulatory Visit: Payer: Self-pay | Admitting: Family Medicine

## 2017-05-18 MED ORDER — HYDROCODONE-ACETAMINOPHEN 5-325 MG PO TABS: 1.0000 | ORAL_TABLET | Freq: Four times a day (QID) | ORAL | 0 refills | Status: DC | PRN

## 2017-05-18 NOTE — Telephone Encounter (Signed)
Pt contacted office for refill request on the following medications:  HYDROcodone-acetaminophen (NORCO/VICODIN) 5-325 MG tablet   [facility].  [phone]

## 2017-05-20 ENCOUNTER — Other Ambulatory Visit: Payer: Self-pay | Admitting: Family Medicine

## 2017-05-23 MED ORDER — CIPROFLOXACIN IN D5W 400 MG/200ML IV SOLN
400.0000 mg | INTRAVENOUS | Status: AC
  Administered 2017-05-24: 400 mg via INTRAVENOUS

## 2017-05-23 MED ORDER — GENTAMICIN SULFATE 40 MG/ML IJ SOLN
120.0000 mg | INTRAVENOUS | Status: AC
  Administered 2017-05-24: 120 mg via INTRAVENOUS
  Filled 2017-05-23 (×2): qty 3

## 2017-05-24 ENCOUNTER — Encounter: Payer: Self-pay | Admitting: *Deleted

## 2017-05-24 ENCOUNTER — Encounter
Admission: RE | Disposition: A | Discharge status: Home / Self Care | Payer: Self-pay | Source: Ambulatory Visit | Attending: Urology

## 2017-05-24 ENCOUNTER — Other Ambulatory Visit: Payer: Self-pay | Admitting: Urology

## 2017-05-24 ENCOUNTER — Ambulatory Visit: Payer: Medicare Other | Admitting: Anesthesiology

## 2017-05-24 ENCOUNTER — Ambulatory Visit
Admission: RE | Admit: 2017-05-24 | Discharge: 2017-05-24 | Disposition: A | Discharge status: Home / Self Care | Payer: Medicare Other | Source: Ambulatory Visit | Attending: Urology | Admitting: Urology

## 2017-05-24 DIAGNOSIS — I2510 Atherosclerotic heart disease of native coronary artery without angina pectoris: Secondary | ICD-10-CM | POA: Insufficient documentation

## 2017-05-24 DIAGNOSIS — F17290 Nicotine dependence, other tobacco product, uncomplicated: Secondary | ICD-10-CM | POA: Diagnosis not present

## 2017-05-24 DIAGNOSIS — E785 Hyperlipidemia, unspecified: Secondary | ICD-10-CM | POA: Insufficient documentation

## 2017-05-24 DIAGNOSIS — Z955 Presence of coronary angioplasty implant and graft: Secondary | ICD-10-CM | POA: Insufficient documentation

## 2017-05-24 DIAGNOSIS — R972 Elevated prostate specific antigen [PSA]: Secondary | ICD-10-CM

## 2017-05-24 DIAGNOSIS — J449 Chronic obstructive pulmonary disease, unspecified: Secondary | ICD-10-CM | POA: Insufficient documentation

## 2017-05-24 DIAGNOSIS — C61 Malignant neoplasm of prostate: Secondary | ICD-10-CM | POA: Insufficient documentation

## 2017-05-24 DIAGNOSIS — Z7982 Long term (current) use of aspirin: Secondary | ICD-10-CM | POA: Diagnosis not present

## 2017-05-24 DIAGNOSIS — Z8249 Family history of ischemic heart disease and other diseases of the circulatory system: Secondary | ICD-10-CM | POA: Insufficient documentation

## 2017-05-24 DIAGNOSIS — Z809 Family history of malignant neoplasm, unspecified: Secondary | ICD-10-CM | POA: Insufficient documentation

## 2017-05-24 DIAGNOSIS — Z833 Family history of diabetes mellitus: Secondary | ICD-10-CM | POA: Diagnosis not present

## 2017-05-24 DIAGNOSIS — M1990 Unspecified osteoarthritis, unspecified site: Secondary | ICD-10-CM | POA: Diagnosis not present

## 2017-05-24 DIAGNOSIS — F329 Major depressive disorder, single episode, unspecified: Secondary | ICD-10-CM | POA: Insufficient documentation

## 2017-05-24 DIAGNOSIS — I10 Essential (primary) hypertension: Secondary | ICD-10-CM | POA: Insufficient documentation

## 2017-05-24 DIAGNOSIS — Z79899 Other long term (current) drug therapy: Secondary | ICD-10-CM | POA: Diagnosis not present

## 2017-05-24 DIAGNOSIS — N1330 Unspecified hydronephrosis: Secondary | ICD-10-CM | POA: Insufficient documentation

## 2017-05-24 DIAGNOSIS — I252 Old myocardial infarction: Secondary | ICD-10-CM | POA: Insufficient documentation

## 2017-05-24 HISTORY — PX: CYSTOSCOPY W/ RETROGRADES: SHX1426

## 2017-05-24 HISTORY — PX: PROSTATE BIOPSY: SHX241

## 2017-05-24 HISTORY — PX: CYSTOSCOPY WITH STENT PLACEMENT: SHX5790

## 2017-05-24 SURGERY — BIOPSY, PROSTATE
Anesthesia: General | Site: Ureter | Laterality: Right | Wound class: Dirty or Infected

## 2017-05-24 MED ORDER — ONDANSETRON HCL 4 MG/2ML IJ SOLN: 4.0000 mg | Freq: Once | INTRAMUSCULAR | Status: DC | PRN

## 2017-05-24 MED ORDER — PROPOFOL 10 MG/ML IV BOLUS
INTRAVENOUS | Status: DC | PRN
  Administered 2017-05-24: 100 mg via INTRAVENOUS

## 2017-05-24 MED ORDER — LACTATED RINGERS IV SOLN
INTRAVENOUS | Status: DC
  Administered 2017-05-24: 10:00:00 via INTRAVENOUS

## 2017-05-24 MED ORDER — FAMOTIDINE 20 MG PO TABS
20.0000 mg | ORAL_TABLET | Freq: Once | ORAL | Status: AC
  Administered 2017-05-24: 20 mg via ORAL

## 2017-05-24 MED ORDER — DEXAMETHASONE SODIUM PHOSPHATE 10 MG/ML IJ SOLN
INTRAMUSCULAR | Status: AC
  Filled 2017-05-24: qty 1

## 2017-05-24 MED ORDER — MIDAZOLAM HCL 2 MG/2ML IJ SOLN
INTRAMUSCULAR | Status: DC | PRN
  Administered 2017-05-24: 2 mg via INTRAVENOUS

## 2017-05-24 MED ORDER — DEXAMETHASONE SODIUM PHOSPHATE 10 MG/ML IJ SOLN
INTRAMUSCULAR | Status: DC | PRN
  Administered 2017-05-24: 10 mg via INTRAVENOUS

## 2017-05-24 MED ORDER — FENTANYL CITRATE (PF) 100 MCG/2ML IJ SOLN
INTRAMUSCULAR | Status: AC
  Filled 2017-05-24: qty 2

## 2017-05-24 MED ORDER — HYDROCODONE-ACETAMINOPHEN 5-325 MG PO TABS: 1.0000 | ORAL_TABLET | Freq: Four times a day (QID) | ORAL | 0 refills | Status: DC | PRN

## 2017-05-24 MED ORDER — FAMOTIDINE 20 MG PO TABS
ORAL_TABLET | ORAL | Status: AC
  Administered 2017-05-24: 20 mg via ORAL
  Filled 2017-05-24: qty 1

## 2017-05-24 MED ORDER — IOTHALAMATE MEGLUMINE 43 % IV SOLN
INTRAVENOUS | Status: DC | PRN
  Administered 2017-05-24: 15 mL via URETHRAL

## 2017-05-24 MED ORDER — FENTANYL CITRATE (PF) 100 MCG/2ML IJ SOLN
INTRAMUSCULAR | Status: DC | PRN
  Administered 2017-05-24: 50 ug via INTRAVENOUS
  Administered 2017-05-24: 25 ug via INTRAVENOUS
  Administered 2017-05-24 (×3): 50 ug via INTRAVENOUS
  Administered 2017-05-24: 25 ug via INTRAVENOUS
  Administered 2017-05-24: 50 ug via INTRAVENOUS

## 2017-05-24 MED ORDER — ONDANSETRON HCL 4 MG/2ML IJ SOLN
INTRAMUSCULAR | Status: DC | PRN
  Administered 2017-05-24: 4 mg via INTRAVENOUS

## 2017-05-24 MED ORDER — CIPROFLOXACIN IN D5W 400 MG/200ML IV SOLN
INTRAVENOUS | Status: AC
  Filled 2017-05-24: qty 200

## 2017-05-24 MED ORDER — LIDOCAINE HCL (CARDIAC) 20 MG/ML IV SOLN
INTRAVENOUS | Status: DC | PRN
  Administered 2017-05-24: 100 mg via INTRAVENOUS

## 2017-05-24 MED ORDER — LIDOCAINE HCL (PF) 2 % IJ SOLN
INTRAMUSCULAR | Status: AC
Start: 2017-05-24 — End: 2017-05-24
  Filled 2017-05-24: qty 10

## 2017-05-24 MED ORDER — FENTANYL CITRATE (PF) 100 MCG/2ML IJ SOLN: 25.0000 ug | INTRAMUSCULAR | Status: DC | PRN

## 2017-05-24 MED ORDER — MIDAZOLAM HCL 2 MG/2ML IJ SOLN
INTRAMUSCULAR | Status: AC
  Filled 2017-05-24: qty 2

## 2017-05-24 MED ORDER — PROPOFOL 10 MG/ML IV BOLUS
INTRAVENOUS | Status: AC
  Filled 2017-05-24: qty 20

## 2017-05-24 MED ORDER — GLYCOPYRROLATE 0.2 MG/ML IJ SOLN
INTRAMUSCULAR | Status: DC | PRN
  Administered 2017-05-24: 0.2 mg via INTRAVENOUS

## 2017-05-24 MED ORDER — ONDANSETRON HCL 4 MG/2ML IJ SOLN
INTRAMUSCULAR | Status: AC
  Filled 2017-05-24: qty 2

## 2017-05-24 SURGICAL SUPPLY — 41 items
BAG DRAIN CYSTO-URO LG1000N (MISCELLANEOUS) ×5 IMPLANT
BAG URINE DRAINAGE (UROLOGICAL SUPPLIES) IMPLANT
BLADE CLIPPER SURG (BLADE) IMPLANT
BRUSH SCRUB EZ  4% CHG (MISCELLANEOUS) ×2
BRUSH SCRUB EZ 4% CHG (MISCELLANEOUS) ×3 IMPLANT
CATH URETL 5X70 OPEN END (CATHETERS) ×10 IMPLANT
CONRAY 43 FOR UROLOGY 50M (MISCELLANEOUS) ×5 IMPLANT
DRAPE SHEET LG 3/4 BI-LAMINATE (DRAPES) ×5 IMPLANT
DRSG TELFA 3X8 NADH (GAUZE/BANDAGES/DRESSINGS) ×5 IMPLANT
DRSG TELFA 4X3 1S NADH ST (GAUZE/BANDAGES/DRESSINGS) ×5 IMPLANT
GAUZE SPONGE 4X4 12PLY STRL (GAUZE/BANDAGES/DRESSINGS) ×5 IMPLANT
GLOVE BIO SURGEON STRL SZ 6.5 (GLOVE) IMPLANT
GLOVE BIO SURGEON STRL SZ7 (GLOVE) ×5 IMPLANT
GLOVE BIO SURGEONS STRL SZ 6.5 (GLOVE)
GLOVE BIOGEL M 8.0 STRL (GLOVE) ×5 IMPLANT
GLOVE BIOGEL PI IND STRL 8 (GLOVE) ×6 IMPLANT
GLOVE BIOGEL PI INDICATOR 8 (GLOVE) ×4
GOWN STANDARD XL  REUSABL (MISCELLANEOUS) ×5 IMPLANT
GUIDE NEEDLE ENDOCAV 16-18 CVR (NEEDLE) ×5 IMPLANT
INST BIOPSY MAXCORE 18GX25 (NEEDLE) ×5 IMPLANT
KIT RM TURNOVER CYSTO AR (KITS) ×5 IMPLANT
NDL DEFLUX 3.7X23X350 (MISCELLANEOUS)
NDL SAFETY ECLIPSE 18X1.5 (NEEDLE) IMPLANT
NEEDLE DEFLUX 3.7X23X350 (MISCELLANEOUS) IMPLANT
NEEDLE GUIDE BIOPSY 644068 (NEEDLE) ×5 IMPLANT
NEEDLE HYPO 18GX1.5 SHARP (NEEDLE)
NEEDLE HYPO 22GX1.5 SAFETY (NEEDLE) IMPLANT
PACK CYSTO AR (MISCELLANEOUS) ×5 IMPLANT
PREP PVP WINGED SPONGE (MISCELLANEOUS) IMPLANT
SENSORWIRE 0.038 NOT ANGLED (WIRE) ×5
SET CYSTO W/LG BORE CLAMP LF (SET/KITS/TRAYS/PACK) ×5 IMPLANT
SOL .9 NS 3000ML IRR  AL (IV SOLUTION)
SOL .9 NS 3000ML IRR UROMATIC (IV SOLUTION) IMPLANT
STENT URET 6FRX24 CONTOUR (STENTS) IMPLANT
STENT URET 6FRX26 CONTOUR (STENTS) ×5 IMPLANT
SURGILUBE 2OZ TUBE FLIPTOP (MISCELLANEOUS) ×5 IMPLANT
SYR 10ML LL (SYRINGE) ×5 IMPLANT
SYRINGE IRR TOOMEY STRL 70CC (SYRINGE) ×5 IMPLANT
WATER STERILE IRR 1000ML POUR (IV SOLUTION) ×5 IMPLANT
WATER STERILE IRR 3000ML UROMA (IV SOLUTION) ×5 IMPLANT
WIRE SENSOR 0.038 NOT ANGLED (WIRE) ×3 IMPLANT

## 2017-05-24 NOTE — Transfer of Care (Signed)
Immediate Anesthesia Transfer of Care Note  Patient: Michael Small  Procedure(s) Performed: PROSTATE BIOPSY (N/A Prostate) CYSTOSCOPY WITH RETROGRADE PYELOGRAM (Bilateral Ureter) CYSTOSCOPY WITH STENT PLACEMENT (Right Ureter)  Patient Location: PACU  Anesthesia Type:General  Level of Consciousness: drowsy  Airway & Oxygen Therapy: Patient Spontanous Breathing and Patient connected to nasal cannula oxygen  Post-op Assessment: Report given to RN and Post -op Vital signs reviewed and stable  Post vital signs: Reviewed and stable  Last Vitals:  Vitals:   05/24/17 1001  BP: (!) 177/70  Pulse: (!) 117  Resp: 20  Temp: 36.9 C  SpO2: 100%    Last Pain:  Vitals:   05/24/17 1001  TempSrc: Oral  PainSc: 0-No pain      Patients Stated Pain Goal: 0 (94/80/16 5537)  Complications: No apparent anesthesia complications

## 2017-05-24 NOTE — OR Nursing (Signed)
Patient voided, urine bloody, no clots noted.  Went over instructions with wife and patient again regarding heavy bleeding and if patient has any clots to return immediately.  Also, that patient should return asap if not able to void.  Verbalized understanding.

## 2017-05-24 NOTE — Anesthesia Preprocedure Evaluation (Addendum)
Anesthesia Evaluation  Patient identified by MRN, date of birth, ID band Patient awake    Reviewed: Allergy & Precautions, NPO status , Patient's Chart, lab work & pertinent test results, reviewed documented beta blocker date and time   Airway Mallampati: II  TM Distance: >3 FB     Dental  (+) Chipped, Upper Dentures, Lower Dentures   Pulmonary COPD, Current Smoker,           Cardiovascular hypertension, Pt. on medications + CAD and + Past MI       Neuro/Psych PSYCHIATRIC DISORDERS Depression    GI/Hepatic   Endo/Other    Renal/GU Renal disease     Musculoskeletal  (+) Arthritis ,   Abdominal   Peds  Hematology   Anesthesia Other Findings EKG reviewed and OK.  Reproductive/Obstetrics                           Anesthesia Physical Anesthesia Plan  ASA: III  Anesthesia Plan: General   Post-op Pain Management:    Induction: Intravenous  PONV Risk Score and Plan:   Airway Management Planned: LMA  Additional Equipment:   Intra-op Plan:   Post-operative Plan:   Informed Consent: I have reviewed the patients History and Physical, chart, labs and discussed the procedure including the risks, benefits and alternatives for the proposed anesthesia with the patient or authorized representative who has indicated his/her understanding and acceptance.     Plan Discussed with: CRNA  Anesthesia Plan Comments:         Anesthesia Quick Evaluation

## 2017-05-24 NOTE — OR Nursing (Signed)
Called Dr Bernardo Heater to clarify discharge instructions and f/u appt.  Patient rates pain a 6 "pressure" patient refused pain meds.

## 2017-05-24 NOTE — Op Note (Signed)
Date of procedure: 05/24/17  Preoperative diagnosis:  1. Elevated PSA 2. Bilateral hydronephrosis/hydroureter  Postoperative diagnosis:  1. Elevated PSA 2. Bilateral hydronephrosis/hydroureter  Procedure: 1. Transrectal ultrasound prostate 2. Transrectal prostate biopsies 3. Cystoscopy with placement right ureteral stent 4. Right retrograde pyelogram  Surgeon: John Giovanni, MD  Anesthesia: General  Complications: None  Intraoperative findings:  1.  DRE remarkable for a firm, constricted area approximately 3-4 cm from the anal sphincter  2.  Right retrograde pyelogram-significant narrowing of the right distal, mid and lower proximal ureter with ureteral dilation proximal to this point and moderate hydronephrosis  EBL: Minimal  Specimens: Needle biopsies prostate  Drains: 6 French/26 cm right ureteral stent  Indication: Michael Small is a 61 y.o. patient with a PSA of > 2000 and bilateral hydronephrosis/hydroureter.  After reviewing the management options for treatment, he elected to proceed with the above surgical procedure(s). We have discussed the potential benefits and risks of the procedure, side effects of the proposed treatment, the likelihood of the patient achieving the goals of the procedure, and any potential problems that might occur during the procedure or recuperation. Informed consent has been obtained.  Description of procedure:  The patient was taken to the operating room and general anesthesia was induced.  The patient was placed in the dorsal lithotomy position, prepped. Preoperative antibiotics were administered. A preoperative time-out was performed.   Prior to draping DRE was performed with findings as described above.  A transrectal ultrasound probe was lubricated and gently inserted.  The ultrasound probe could not be passed beyond the narrowed, firm area.  Prostate images were suboptimal.  Standard 12 core biopsies were performed.  There was moderate  amount of rectal bleeding noted initially after removal of the probe which quickly subsided.  He was then prepped and draped in the usual fashion.  A 22 French cystoscope was lubricated and passed under direct vision.  The urethra was normal caliber without stricture.  The prostate showed mild lateral lobe enlargement and the prostate was noted to be fixed making maneuvering of the cystoscope in the bladder difficult.  The trigone was distorted with some bullous edema present.  There was irregular appearing tissue on the left hemitrigone with hypervascularity.  A 6 French open-ended ureteral catheter was placed through the cystoscope and in between an area of bullous edema on the right hemitrigone a guidewire was placed through the catheter and advanced and this area was found to be the right ureteral orifice.  The guidewire was advanced to the renal pelvis with position verified by fluoroscopy.  The open-ended catheter was advanced over the wire to the region of the proximal ureter and the guidewire was removed.  Right retrograde pyelogram was then performed with findings as described above.  The guidewire was replaced and the ureteral catheter was removed.  A 6 French/26 cm double-J ureteral stent was placed with moderate difficulty secondary to narrowing of the distal and mid ureter.  Good stent positioning was verified by fluoroscopy and direct vision.  Attention was then directed to the left hemitrigone however due to the irregular tissue the left ureteral orifice could not be identified.  Several areas were probed with a guidewire however were not the UO.  It was elected to abort the remainder of the procedure.  The bladder was emptied and the cystoscope was withdrawn.  No significant bleeding was noted.    He was transported to PACU in stable condition after anesthetic reversal.    John Giovanni, M.D.

## 2017-05-24 NOTE — Anesthesia Procedure Notes (Signed)
Procedure Name: LMA Insertion Date/Time: 05/24/2017 11:31 AM Performed by: Eben Burow, CRNA Pre-anesthesia Checklist: Patient identified, Emergency Drugs available, Suction available, Patient being monitored and Timeout performed Patient Re-evaluated:Patient Re-evaluated prior to induction Oxygen Delivery Method: Circle system utilized Preoxygenation: Pre-oxygenation with 100% oxygen Induction Type: IV induction LMA: LMA inserted LMA Size: 4.0 Number of attempts: 1 Placement Confirmation: positive ETCO2 and breath sounds checked- equal and bilateral Tube secured with: Tape Dental Injury: Teeth and Oropharynx as per pre-operative assessment

## 2017-05-24 NOTE — Discharge Instructions (Signed)
AMBULATORY SURGERY  DISCHARGE INSTRUCTIONS   1) The drugs that you were given will stay in your system until tomorrow so for the next 24 hours you should not:  A) Drive an automobile B) Make any legal decisions C) Drink any alcoholic beverage   2) You may resume regular meals tomorrow.  Today it is better to start with liquids and gradually work up to solid foods.  You may eat anything you prefer, but it is better to start with liquids, then soup and crackers, and gradually work up to solid foods.   3) Please notify your doctor immediately if you have any unusual bleeding, trouble breathing, redness and pain at the surgery site, drainage, fever, or pain not relieved by medication.    4) Additional Instructions: May experience urgency, frequency and blood in urine.  Call MD for fever of 101 or greater or if you experience heavy bleeding.        Please contact your physician with any problems or Same Day Surgery at [phone], Monday through Friday 6 am to 4 pm, or [place] [facility] number at [phone].

## 2017-05-24 NOTE — Anesthesia Postprocedure Evaluation (Signed)
Anesthesia Post Note  Patient: Michael Small  Procedure(s) Performed: PROSTATE BIOPSY (N/A Prostate) CYSTOSCOPY WITH RETROGRADE PYELOGRAM (Bilateral Ureter) CYSTOSCOPY WITH STENT PLACEMENT (Right Ureter)  Patient location during evaluation: PACU Anesthesia Type: General Level of consciousness: awake and alert Pain management: pain level controlled Vital Signs Assessment: post-procedure vital signs reviewed and stable Respiratory status: spontaneous breathing, nonlabored ventilation, respiratory function stable and patient connected to nasal cannula oxygen Cardiovascular status: blood pressure returned to baseline and stable Postop Assessment: no apparent nausea or vomiting Anesthetic complications: no     Last Vitals:  Vitals:   05/24/17 1305 05/24/17 1319  BP: (!) 144/79 (!) 145/72  Pulse: (!) 105 100  Resp: 19 16  Temp:  (!) 36.3 C  SpO2: 100% 96%    Last Pain:  Vitals:   05/24/17 1319  TempSrc:   PainSc: 0-No pain                 Finesse Fielder S

## 2017-05-24 NOTE — Anesthesia Post-op Follow-up Note (Signed)
Anesthesia QCDR form completed.        

## 2017-05-24 NOTE — Interval H&P Note (Signed)
History and Physical Interval Note:  05/24/2017 11:08 AM  Michael Small  has presented today for surgery, with the diagnosis of elevated PSA, Bilateral hydronephrosis  The various methods of treatment have been discussed with the patient and family. After consideration of risks, benefits and other options for treatment, the patient has consented to  Procedure(s): PROSTATE BIOPSY (N/A) CYSTOSCOPY WITH RETROGRADE PYELOGRAM (Bilateral) CYSTOSCOPY WITH STENT PLACEMENT (Bilateral) as a surgical intervention .  The patient's history has been reviewed, patient examined, no change in status, stable for surgery.  I have reviewed the patient's chart and labs.  Questions were answered to the patient's satisfaction.     [name]

## 2017-05-25 ENCOUNTER — Other Ambulatory Visit: Payer: Self-pay | Admitting: Pathology

## 2017-05-25 ENCOUNTER — Encounter: Payer: Self-pay | Admitting: Urology

## 2017-05-25 LAB — SURGICAL PATHOLOGY

## 2017-06-01 ENCOUNTER — Other Ambulatory Visit: Payer: Self-pay | Admitting: Radiology

## 2017-06-01 ENCOUNTER — Ambulatory Visit (INDEPENDENT_AMBULATORY_CARE_PROVIDER_SITE_OTHER): Payer: Medicare Other | Admitting: Urology

## 2017-06-01 VITALS — BP 150/72 | HR 98 | Ht 71.0 in | Wt 159.8 lb

## 2017-06-01 DIAGNOSIS — N1330 Unspecified hydronephrosis: Secondary | ICD-10-CM

## 2017-06-01 DIAGNOSIS — C61 Malignant neoplasm of prostate: Secondary | ICD-10-CM | POA: Diagnosis not present

## 2017-06-01 MED ORDER — LEUPROLIDE ACETATE (3 MONTH) 22.5 MG IM KIT
22.5000 mg | PACK | Freq: Once | INTRAMUSCULAR | Status: AC
  Administered 2017-06-01: 22.5 mg via INTRAMUSCULAR

## 2017-06-01 NOTE — Progress Notes (Signed)
06/01/2017 4:30 PM   Michael Small 1955-06-18 [id number]  Referring provider: Birdie Sons, [place],  [id number]  Chief Complaint  Patient presents with  . Follow-up    HPI: The patient is a 62 year old gentleman who presents today for evaluation of an extremely elevated PSA as well as bilateral hydroureteronephrosis.  The patient has a PSA of 2338.8.  Is also found to have bilateral hydroureteronephrosis to the level of the bladder.  He has renal deficiency with a creatinine of 2.95 up from baseline of 1.5 approximately 1 year ago.  CT abdomen without contrast also shows extensive abdominal lymphadenopathy.  Patient also notes unexplained weight loss of 25 pounds over the last year.  At this point, denies any urinary issues.  He feels this appointment is to talk about his bladder not emptying.  He feels that his bladder does not empty well.  He has no dysuria.  He has a good stream.    He underwent prostate biopsy in the operating room.  At that time, a right ureteral stent was placed.  The left ureteral orifice was unable to be visualized, so his left side remains obstructed.  Unfortunately, pathology showed 12 of 12 cores positive for Gleason 4+5 = 9 prostate cancer.  Skeletal muscle and colon mucosa were involved by adenocarcinoma on multiple samples.  He has since lost 11 more pounds since he was first seen per    PMH: Past Medical History:  Diagnosis Date  . Depression   . Hyperlipidemia   . Hypertension   . Myocardial infarction [facility])     Surgical History: Past Surgical History:  Procedure Laterality Date  . CORONARY ANGIOPLASTY WITH STENT PLACEMENT  12/2011   [facility] Cardiology  . CYSTOSCOPY W/ RETROGRADES Bilateral 05/24/2017   Procedure: CYSTOSCOPY WITH RETROGRADE PYELOGRAM;  Surgeon: [name], MD;  Location: [facility] ORS;  Service: Urology;  Laterality: Bilateral;  . CYSTOSCOPY WITH STENT PLACEMENT Right 05/24/2017   Procedure: CYSTOSCOPY WITH STENT PLACEMENT;  Surgeon: [name], MD;  Location: [facility] ORS;  Service: Urology;  Laterality: Right;  . PROSTATE BIOPSY N/A 05/24/2017   Procedure: PROSTATE BIOPSY;  Surgeon: [name], MD;  Location: [facility] ORS;  Service: Urology;  Laterality: N/A;  . US ECHOCARDIOGRAPHY  12/2011   Mild LV dysfunction, EF=45%    Home Medications:  Allergies as of 06/01/2017   No Known Allergies     Medication List        Accurate as of 06/01/17  4:30 PM. Always use your most recent med list.          amLODipine 5 MG tablet Commonly known as:  NORVASC TAKE 1 TABLET BY MOUTH EVERY DAY   aspirin 81 MG tablet Take 81 mg by mouth daily.   diphenoxylate-atropine 2.5-0.025 MG tablet Commonly known as:  LOMOTIL TAKE 1 TABLET BY MOUTH 2 (TWO) TIMES DAILY AS NEEDED FOR DIARRHEA OR LOOSE STOOLS.   HYDROcodone-acetaminophen 5-325 MG tablet Commonly known as:  NORCO/VICODIN Take 1 tablet by mouth every 6 (six) hours as needed for moderate pain.   ibuprofen 200 MG tablet Commonly known as:  ADVIL,MOTRIN Take 200-400 mg by mouth 2 (two) times daily as needed for headache or moderate pain.   tamsulosin 0.4 MG Caps capsule Commonly known as:  FLOMAX Take 1 capsule (0.4 mg total) by mouth daily.       Allergies: No Known Allergies  Family History: Family History  Problem Relation  Age of Onset  . Cancer Mother   . Hypertension Mother   . Diabetes Father   . Heart disease Father   . Prostate cancer Neg Hx   . Bladder Cancer Neg Hx   . Kidney cancer Neg Hx     Social History:  reports that he has been smoking cigars.  He has a 24.00 pack-year smoking history. he has never used smokeless tobacco. He reports that he does not drink alcohol or use drugs.  ROS: UROLOGY Frequent Urination?: Yes Hard to postpone urination?: Yes Burning/pain with urination?: No Get up at night to urinate?: Yes Leakage of urine?: Yes Urine stream starts and stops?:  Yes Trouble starting stream?: Yes Do you have to strain to urinate?: No Blood in urine?: No Urinary tract infection?: No Sexually transmitted disease?: No Injury to kidneys or bladder?: Yes Painful intercourse?: No Weak stream?: Yes Erection problems?: No Penile pain?: No  Gastrointestinal Nausea?: Yes Vomiting?: Yes Indigestion/heartburn?: Yes Diarrhea?: Yes Constipation?: No  Constitutional Fever: Yes Night sweats?: No Weight loss?: Yes Fatigue?: Yes  Skin Skin rash/lesions?: No Itching?: Yes  Eyes Blurred vision?: No Double vision?: No  Ears/Nose/Throat Sore throat?: No Sinus problems?: No  Hematologic/Lymphatic Swollen glands?: Yes Easy bruising?: Yes  Cardiovascular Leg swelling?: Yes Chest pain?: No  Respiratory Cough?: Yes Shortness of breath?: Yes  Endocrine Excessive thirst?: Yes  Musculoskeletal Back pain?: Yes Joint pain?: Yes  Neurological Headaches?: No Dizziness?: No  Psychologic Depression?: Yes Anxiety?: Yes  Physical Exam: BP (!) 150/72 (BP Location: Right Arm, Patient Position: Sitting, Cuff Size: Normal)   Pulse 98   Ht 5\' 11"  (1.803 m)   Wt 159 lb 12.8 oz (72.5 kg)   BMI 22.29 kg/m   Constitutional:  Alert and oriented, No acute distress. HEENT: [place] AT, moist mucus membranes.  Trachea midline, no masses. Cardiovascular: No clubbing, cyanosis, or edema. Respiratory: Normal respiratory effort, no increased work of breathing. GI: Abdomen is soft, nontender, nondistended, no abdominal masses GU: No CVA tenderness.  Skin: No rashes, bruises or suspicious lesions. Lymph: No cervical or inguinal adenopathy. Neurologic: Grossly intact, no focal deficits, moving all 4 extremities. Psychiatric: Normal mood and affect.  Laboratory Data: Lab Results  Component Value Date   WBC 8.9 04/17/2017   HGB 13.1 (L) 04/17/2017   HCT 37.8 (L) 04/17/2017   MCV 90.6 04/17/2017   PLT 209 04/17/2017    Lab Results  Component Value  Date   CREATININE 2.95 (H) 04/24/2017    Lab Results  Component Value Date   PSA 2,338.8 (H) 04/24/2017    No results found for: TESTOSTERONE  Lab Results  Component Value Date   HGBA1C 5.8 (H) 11/15/2016    Urinalysis    Component Value Date/Time   COLORURINE YELLOW (A) 05/16/2017 1102   APPEARANCEUR CLEAR (A) 05/16/2017 1102   APPEARANCEUR Clear 05/04/2017 1651   LABSPEC 1.011 05/16/2017 1102   PHURINE 5.0 05/16/2017 1102   GLUCOSEU NEGATIVE 05/16/2017 1102   HGBUR NEGATIVE 05/16/2017 1102   BILIRUBINUR NEGATIVE 05/16/2017 1102   BILIRUBINUR Negative 05/04/2017 1651   KETONESUR NEGATIVE 05/16/2017 1102   PROTEINUR NEGATIVE 05/16/2017 1102   UROBILINOGEN 0.2 04/21/2017 1223   NITRITE NEGATIVE 05/16/2017 1102   LEUKOCYTESUR NEGATIVE 05/16/2017 1102   LEUKOCYTESUR Negative 05/04/2017 1651    Assessment & Plan:   1.  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  It also did show local  invasion of the colon on the prostate biopsy samples.  He does have proven metastatic disease.  Given his high PSA I do think he would benefit from not only androgen deprivation therapy but a consultation with medical oncology to discuss concurrent chemotherapy.  We again discussed the risk, benefits, and indications of androgen deprivation therapy.  He understands the risks include fatigue, hot flashes, weakness, decreased sex drive, erectile dysfunction, and osteoporosis.  Has been instructed to start calcium and vitamin D. I have offered for him to start this today.  He is agreeable.  We have given him a 28-month shot.  We will also get a bone scan and chest CT at this time to complete staging.  He will follow-up in 3 months for repeat Lupron as well as PSA and testosterone prior.  Hopefully is able to get in to  see medical oncology in the very near future.  2.  Bilateral hydroureteronephrosis secondary to above The patient has been temporized the right ureteral stent.  We did discuss antegrade nephrostomy tube with possible antegrade ureteral stent placement on the left side in the near future.  We also discussed that the stents will need to be changed every 3 months.  We will arrange for him to see interventional radiology for left stent placement.  We will discuss stent exchange at his next visit.  Return in about 3 months (around 08/30/2017) for repeat lupron with PSA/T prior. (Will call w/ imaging results).  Nickie Retort, MD  [facility] [address], [place], [place] [address] [phone]

## 2017-06-01 NOTE — Progress Notes (Signed)
Lupron IM Injection   Due to Prostate Cancer patient is present today for a Lupron Injection.  Medication: Lupron 3 month Dose: 22.5 mg  Location: right upper outer buttocks Lot: [id number] Exp: 09/13/2019  Patient tolerated well, no complications were noted  Performed by: Elberta Leatherwood, CMA

## 2017-06-02 ENCOUNTER — Other Ambulatory Visit: Payer: Self-pay

## 2017-06-02 MED ORDER — HYDROCODONE-ACETAMINOPHEN 5-325 MG PO TABS: 1.0000 | ORAL_TABLET | Freq: Four times a day (QID) | ORAL | 0 refills | Status: DC | PRN

## 2017-06-02 NOTE — Telephone Encounter (Signed)
Patient requesting refill on Hydrocodone-Anastasiya V Hopkins, RMA

## 2017-06-05 ENCOUNTER — Telehealth: Payer: Self-pay

## 2017-06-05 ENCOUNTER — Telehealth: Payer: Self-pay | Admitting: Radiology

## 2017-06-05 MED ORDER — HYDROCODONE-ACETAMINOPHEN 5-325 MG PO TABS: 1.0000 | ORAL_TABLET | ORAL | 0 refills | Status: DC | PRN

## 2017-06-05 NOTE — Telephone Encounter (Signed)
Notified pt's wife, Parke Simmers (Shauna Hugh), of antegrade ureteral stent placement scheduled 06/12/17 @11 :54. Instructions given. Wife voices understanding.

## 2017-06-05 NOTE — Telephone Encounter (Signed)
Advised wife as below.

## 2017-06-05 NOTE — Telephone Encounter (Signed)
Please review. Thanks!  

## 2017-06-05 NOTE — Telephone Encounter (Signed)
Please advise have sent new prescription with new directions and should be able to fill today. Have them call if any problems getting prescription filled today.

## 2017-06-05 NOTE — Telephone Encounter (Signed)
Wife states [facility] [address] will not refill Norco rx because it is too soon for refill. Sig states he is to take 1 tab po q 6 hours PRN. # 30 was prescribed. Wife states pharmacy will need new rx if this can be filled early.

## 2017-06-06 ENCOUNTER — Other Ambulatory Visit: Payer: Self-pay | Admitting: Radiology

## 2017-06-06 DIAGNOSIS — N1330 Unspecified hydronephrosis: Secondary | ICD-10-CM

## 2017-06-08 ENCOUNTER — Other Ambulatory Visit: Payer: Self-pay | Admitting: Radiology

## 2017-06-12 ENCOUNTER — Ambulatory Visit: Payer: Medicare Other

## 2017-06-12 ENCOUNTER — Ambulatory Visit
Admission: RE | Admit: 2017-06-12 | Discharge: 2017-06-12 | Disposition: A | Discharge status: Home / Self Care | Payer: Medicare Other | Source: Ambulatory Visit | Attending: Urology | Admitting: Urology

## 2017-06-12 ENCOUNTER — Inpatient Hospital Stay
Admission: RE | Admit: 2017-06-12 | Discharge: 2017-06-15 | DRG: 640 | Disposition: A | Discharge status: Home / Self Care | Payer: Medicare Other | Source: Ambulatory Visit | Attending: Internal Medicine | Admitting: Internal Medicine

## 2017-06-12 ENCOUNTER — Other Ambulatory Visit: Payer: Self-pay

## 2017-06-12 DIAGNOSIS — Z7982 Long term (current) use of aspirin: Secondary | ICD-10-CM | POA: Diagnosis not present

## 2017-06-12 DIAGNOSIS — N19 Unspecified kidney failure: Secondary | ICD-10-CM | POA: Diagnosis not present

## 2017-06-12 DIAGNOSIS — I129 Hypertensive chronic kidney disease with stage 1 through stage 4 chronic kidney disease, or unspecified chronic kidney disease: Secondary | ICD-10-CM | POA: Diagnosis present

## 2017-06-12 DIAGNOSIS — F17210 Nicotine dependence, cigarettes, uncomplicated: Secondary | ICD-10-CM | POA: Diagnosis present

## 2017-06-12 DIAGNOSIS — N179 Acute kidney failure, unspecified: Secondary | ICD-10-CM | POA: Diagnosis not present

## 2017-06-12 DIAGNOSIS — E875 Hyperkalemia: Principal | ICD-10-CM | POA: Diagnosis present

## 2017-06-12 DIAGNOSIS — E43 Unspecified severe protein-calorie malnutrition: Secondary | ICD-10-CM | POA: Diagnosis present

## 2017-06-12 DIAGNOSIS — N183 Chronic kidney disease, stage 3 (moderate): Secondary | ICD-10-CM | POA: Diagnosis present

## 2017-06-12 DIAGNOSIS — Z79899 Other long term (current) drug therapy: Secondary | ICD-10-CM | POA: Diagnosis not present

## 2017-06-12 DIAGNOSIS — F329 Major depressive disorder, single episode, unspecified: Secondary | ICD-10-CM | POA: Diagnosis present

## 2017-06-12 DIAGNOSIS — Z8249 Family history of ischemic heart disease and other diseases of the circulatory system: Secondary | ICD-10-CM

## 2017-06-12 DIAGNOSIS — R600 Localized edema: Secondary | ICD-10-CM | POA: Diagnosis not present

## 2017-06-12 DIAGNOSIS — Z6821 Body mass index (BMI) 21.0-21.9, adult: Secondary | ICD-10-CM

## 2017-06-12 DIAGNOSIS — C61 Malignant neoplasm of prostate: Secondary | ICD-10-CM | POA: Diagnosis present

## 2017-06-12 DIAGNOSIS — N1330 Unspecified hydronephrosis: Secondary | ICD-10-CM

## 2017-06-12 DIAGNOSIS — I252 Old myocardial infarction: Secondary | ICD-10-CM

## 2017-06-12 DIAGNOSIS — E785 Hyperlipidemia, unspecified: Secondary | ICD-10-CM | POA: Diagnosis present

## 2017-06-12 DIAGNOSIS — T829XXA Unspecified complication of cardiac and vascular prosthetic device, implant and graft, initial encounter: Secondary | ICD-10-CM

## 2017-06-12 DIAGNOSIS — I2510 Atherosclerotic heart disease of native coronary artery without angina pectoris: Secondary | ICD-10-CM | POA: Diagnosis present

## 2017-06-12 DIAGNOSIS — Z955 Presence of coronary angioplasty implant and graft: Secondary | ICD-10-CM

## 2017-06-12 DIAGNOSIS — Z452 Encounter for adjustment and management of vascular access device: Secondary | ICD-10-CM | POA: Diagnosis not present

## 2017-06-12 DIAGNOSIS — R000 Tachycardia, unspecified: Secondary | ICD-10-CM | POA: Diagnosis not present

## 2017-06-12 DIAGNOSIS — R609 Edema, unspecified: Secondary | ICD-10-CM | POA: Diagnosis not present

## 2017-06-12 HISTORY — DX: Malignant neoplasm of prostate: C61

## 2017-06-12 LAB — BASIC METABOLIC PANEL
ANION GAP: 19 — AB (ref 5–15)
Anion gap: 15 (ref 5–15)
BUN: 108 mg/dL — AB (ref 6–20)
BUN: 110 mg/dL — AB (ref 6–20)
CHLORIDE: 106 mmol/L (ref 101–111)
CO2: 16 mmol/L — ABNORMAL LOW (ref 22–32)
CO2: 16 mmol/L — AB (ref 22–32)
Calcium: 9.7 mg/dL (ref 8.9–10.3)
Calcium: 9.3 mg/dL (ref 8.9–10.3)
Chloride: 104 mmol/L (ref 101–111)
Creatinine, Ser: 6.91 mg/dL — ABNORMAL HIGH (ref 0.61–1.24)
Creatinine, Ser: 7.23 mg/dL — ABNORMAL HIGH (ref 0.61–1.24)
GFR calc Af Amer: 8 mL/min — ABNORMAL LOW (ref >60)
GFR calc non Af Amer: 7 mL/min — ABNORMAL LOW (ref >60)
GFR, EST AFRICAN AMERICAN: 9 mL/min — AB (ref >60)
GFR, EST NON AFRICAN AMERICAN: 8 mL/min — AB (ref >60)
Glucose, Bld: 107 mg/dL — ABNORMAL HIGH (ref 65–99)
Glucose, Bld: 92 mg/dL (ref 65–99)
POTASSIUM: 6.3 mmol/L — AB (ref 3.5–5.1)
POTASSIUM: 7.2 mmol/L — AB (ref 3.5–5.1)
SODIUM: 139 mmol/L (ref 135–145)
SODIUM: 137 mmol/L (ref 135–145)

## 2017-06-12 LAB — CBC
HEMATOCRIT: 33.2 % — AB (ref 40.0–52.0)
Hemoglobin: 11.3 g/dL — ABNORMAL LOW (ref 13.0–18.0)
MCH: 30.9 pg (ref 26.0–34.0)
MCHC: 34 g/dL (ref 32.0–36.0)
MCV: 90.9 fL (ref 80.0–100.0)
PLATELETS: 140 10*3/uL — AB (ref 150–440)
RBC: 3.66 MIL/uL — ABNORMAL LOW (ref 4.40–5.90)
RDW: 13 % (ref 11.5–14.5)
WBC: 6.7 10*3/uL (ref 3.8–10.6)

## 2017-06-12 LAB — GLUCOSE, CAPILLARY: Glucose-Capillary: 75 mg/dL (ref 65–99)

## 2017-06-12 LAB — PROTIME-INR
INR: 1.22
Prothrombin Time: 15.3 seconds — ABNORMAL HIGH (ref 11.4–15.2)

## 2017-06-12 LAB — MRSA PCR SCREENING: MRSA BY PCR: NEGATIVE

## 2017-06-12 LAB — TSH: TSH: 2.144 u[IU]/mL (ref 0.350–4.500)

## 2017-06-12 MED ORDER — CALCIUM GLUCONATE 10 % IV SOLN: 1.0000 g | Freq: Once | INTRAVENOUS | Status: DC

## 2017-06-12 MED ORDER — ONDANSETRON HCL 4 MG PO TABS: 4.0000 mg | ORAL_TABLET | Freq: Four times a day (QID) | ORAL | Status: DC | PRN

## 2017-06-12 MED ORDER — HYDROCODONE-ACETAMINOPHEN 5-325 MG PO TABS: 1.0000 | ORAL_TABLET | ORAL | Status: DC | PRN

## 2017-06-12 MED ORDER — ACETAMINOPHEN 325 MG PO TABS
650.0000 mg | ORAL_TABLET | Freq: Four times a day (QID) | ORAL | Status: DC | PRN
  Administered 2017-06-13 – 2017-06-14 (×2): 650 mg via ORAL
  Filled 2017-06-12 (×2): qty 2

## 2017-06-12 MED ORDER — STERILE WATER FOR INJECTION IV SOLN
INTRAVENOUS | Status: DC
  Administered 2017-06-12 – 2017-06-14 (×4): via INTRAVENOUS
  Filled 2017-06-12 (×6): qty 850

## 2017-06-12 MED ORDER — SODIUM POLYSTYRENE SULFONATE 15 GM/60ML PO SUSP: 30.0000 g | Freq: Once | ORAL | Status: DC

## 2017-06-12 MED ORDER — LIDOCAINE HCL 2 % IJ SOLN
5.0000 mL | Freq: Once | INTRAMUSCULAR | Status: AC
  Administered 2017-06-12: 100 mg via INTRADERMAL
  Filled 2017-06-12 (×2): qty 10

## 2017-06-12 MED ORDER — ACETAMINOPHEN 325 MG PO TABS: 650.0000 mg | ORAL_TABLET | Freq: Four times a day (QID) | ORAL | Status: DC | PRN

## 2017-06-12 MED ORDER — AMLODIPINE BESYLATE 5 MG PO TABS: 5.0000 mg | ORAL_TABLET | Freq: Every day | ORAL | Status: DC

## 2017-06-12 MED ORDER — FENTANYL CITRATE (PF) 100 MCG/2ML IJ SOLN
INTRAMUSCULAR | Status: AC
  Filled 2017-06-12: qty 4

## 2017-06-12 MED ORDER — CEFAZOLIN SODIUM-DEXTROSE 2-4 GM/100ML-% IV SOLN: 2.0000 g | Freq: Once | INTRAVENOUS | Status: DC

## 2017-06-12 MED ORDER — TAMSULOSIN HCL 0.4 MG PO CAPS
0.4000 mg | ORAL_CAPSULE | Freq: Every day | ORAL | Status: DC
  Administered 2017-06-12 – 2017-06-15 (×4): 0.4 mg via ORAL
  Filled 2017-06-12 (×4): qty 1

## 2017-06-12 MED ORDER — HEPARIN SODIUM (PORCINE) 5000 UNIT/ML IJ SOLN
5000.0000 [IU] | Freq: Three times a day (TID) | INTRAMUSCULAR | Status: DC
  Administered 2017-06-12 – 2017-06-13 (×2): 5000 [IU] via SUBCUTANEOUS
  Filled 2017-06-12 (×2): qty 1

## 2017-06-12 MED ORDER — DEXTROSE 50 % IV SOLN: 25.0000 mL | Freq: Once | INTRAVENOUS | Status: DC

## 2017-06-12 MED ORDER — ACETAMINOPHEN 650 MG RE SUPP: 650.0000 mg | Freq: Four times a day (QID) | RECTAL | Status: DC | PRN

## 2017-06-12 MED ORDER — CIPROFLOXACIN IN D5W 400 MG/200ML IV SOLN: 400.0000 mg | INTRAVENOUS | Status: DC

## 2017-06-12 MED ORDER — SODIUM CHLORIDE 0.9 % IV SOLN
1.0000 g | Freq: Once | INTRAVENOUS | Status: AC
  Administered 2017-06-12: 1 g via INTRAVENOUS
  Filled 2017-06-12: qty 10

## 2017-06-12 MED ORDER — INSULIN ASPART 100 UNIT/ML IV SOLN
10.0000 [IU] | Freq: Once | INTRAVENOUS | Status: AC
  Administered 2017-06-12: 10 [IU] via INTRAVENOUS
  Filled 2017-06-12: qty 0.1

## 2017-06-12 MED ORDER — DEXTROSE 50 % IV SOLN
25.0000 mL | Freq: Once | INTRAVENOUS | Status: AC
  Administered 2017-06-12: 20:00:00 25 mL via INTRAVENOUS
  Filled 2017-06-12: qty 50

## 2017-06-12 MED ORDER — INSULIN ASPART 100 UNIT/ML IV SOLN: 10.0000 [IU] | Freq: Once | INTRAVENOUS | Status: DC

## 2017-06-12 MED ORDER — MIDAZOLAM HCL 5 MG/5ML IJ SOLN
INTRAMUSCULAR | Status: AC
  Filled 2017-06-12: qty 5

## 2017-06-12 MED ORDER — HEPARIN SODIUM (PORCINE) 5000 UNIT/ML IJ SOLN: 5000.0000 [IU] | Freq: Three times a day (TID) | INTRAMUSCULAR | Status: DC

## 2017-06-12 MED ORDER — ONDANSETRON HCL 4 MG/2ML IJ SOLN: 4.0000 mg | Freq: Four times a day (QID) | INTRAMUSCULAR | Status: DC | PRN

## 2017-06-12 MED ORDER — DIPHENOXYLATE-ATROPINE 2.5-0.025 MG PO TABS: 1.0000 | ORAL_TABLET | Freq: Two times a day (BID) | ORAL | Status: DC | PRN

## 2017-06-12 MED ORDER — SODIUM CHLORIDE 0.9 % IV SOLN
INTRAVENOUS | Status: DC
  Administered 2017-06-12: 18:00:00 via INTRAVENOUS

## 2017-06-12 MED ORDER — AMLODIPINE BESYLATE 5 MG PO TABS
5.0000 mg | ORAL_TABLET | Freq: Every day | ORAL | Status: DC
  Administered 2017-06-12 – 2017-06-15 (×4): 5 mg via ORAL
  Filled 2017-06-12 (×4): qty 1

## 2017-06-12 MED ORDER — TAMSULOSIN HCL 0.4 MG PO CAPS: 0.4000 mg | ORAL_CAPSULE | Freq: Every day | ORAL | Status: DC

## 2017-06-12 MED ORDER — NEPRO/CARBSTEADY PO LIQD
237.0000 mL | ORAL | Status: DC
  Administered 2017-06-12: 237 mL via ORAL

## 2017-06-12 MED ORDER — SODIUM CHLORIDE 0.9 % IV SOLN
INTRAVENOUS | Status: DC
  Administered 2017-06-12: 12:00:00 via INTRAVENOUS

## 2017-06-12 MED ORDER — SODIUM POLYSTYRENE SULFONATE 15 GM/60ML PO SUSP
30.0000 g | ORAL | Status: AC
  Administered 2017-06-12: 20:00:00 30 g via ORAL
  Filled 2017-06-12: qty 120

## 2017-06-12 MED ORDER — SODIUM CHLORIDE 0.9 % IV SOLN: INTRAVENOUS | Status: DC

## 2017-06-12 NOTE — H&P (Signed)
Chief Complaint: Patient was seen in consultation today for left hydronephrosis  Referring Physician(s): Nickie Retort  Supervising Physician: Aletta Edouard  Patient Status: [facility] - Out-pt  History of Present Illness: Michael Small is a 62 y.o. male with past medical history of HLD, HTN, MI who presented to Urology office with elevated PSA and bilateral hydroureteronephrosis. He underwent right uretueral stent placement and prostate biopsy, however was unable to have left-sided stent placed at the same time.  IR consulted for percutaneous left nephroureteral stent placement.  Case reviewed by Dr. Kathlene Cote who approves patient for procedure.  Patient presents to radiology department today in his usual state of health.  He has no complaints today.  He has been NPO.  He does not take blood thinners.   Past Medical History:  Diagnosis Date  . Depression   . Hyperlipidemia   . Hypertension   . Myocardial infarction [facility])     Past Surgical History:  Procedure Laterality Date  . CORONARY ANGIOPLASTY WITH STENT PLACEMENT  12/2011   [facility] Cardiology  . CYSTOSCOPY W/ RETROGRADES Bilateral 05/24/2017   Procedure: CYSTOSCOPY WITH RETROGRADE PYELOGRAM;  Surgeon: [name], MD;  Location: [facility] ORS;  Service: Urology;  Laterality: Bilateral;  . CYSTOSCOPY WITH STENT PLACEMENT Right 05/24/2017   Procedure: CYSTOSCOPY WITH STENT PLACEMENT;  Surgeon: [name], MD;  Location: [facility] ORS;  Service: Urology;  Laterality: Right;  . PROSTATE BIOPSY N/A 05/24/2017   Procedure: PROSTATE BIOPSY;  Surgeon: [name], MD;  Location: [facility] ORS;  Service: Urology;  Laterality: N/A;  . US ECHOCARDIOGRAPHY  12/2011   Mild LV dysfunction, EF=45%    Allergies: Patient has no known allergies.  Medications: Prior to Admission medications   Medication Sig Start Date End Date Taking? Authorizing Provider  amLODipine (NORVASC) 5 MG tablet TAKE 1 TABLET BY MOUTH EVERY DAY  05/18/17  Yes Birdie Sons, MD  aspirin 81 MG tablet Take 81 mg by mouth daily.    Yes [provider]  diphenoxylate-atropine (LOMOTIL) 2.5-0.025 MG tablet TAKE 1 TABLET BY MOUTH 2 (TWO) TIMES DAILY AS NEEDED FOR DIARRHEA OR LOOSE STOOLS. 05/05/17  Yes Birdie Sons, MD  HYDROcodone-acetaminophen (NORCO/VICODIN) 5-325 MG tablet Take 1 tablet by mouth every 4 (four) hours as needed for moderate pain. 06/05/17  Yes Birdie Sons, MD  tamsulosin (FLOMAX) 0.4 MG CAPS capsule Take 1 capsule (0.4 mg total) by mouth daily. 04/28/17  Yes Birdie Sons, MD  ibuprofen (ADVIL,MOTRIN) 200 MG tablet Take 200-400 mg by mouth 2 (two) times daily as needed for headache or moderate pain.     [provider]     Family History  Problem Relation Age of Onset  . Cancer Mother   . Hypertension Mother   . Diabetes Father   . Heart disease Father   . Prostate cancer Neg Hx   . Bladder Cancer Neg Hx   . Kidney cancer Neg Hx     Social History   Socioeconomic History  . Marital status: Married    Spouse name: None  . Number of children: None  . Years of education: 69  . Highest education level: None  Social Needs  . Financial resource strain: None  . Food insecurity - worry: None  . Food insecurity - inability: None  . Transportation needs - medical: None  . Transportation needs - non-medical: None  Occupational History  . Occupation: Disabled   Tobacco Use  . Smoking status:  Current Every Day Smoker    Packs/day: 0.50    Years: 48.00    Pack years: 24.00    Types: Cigars  . Smokeless tobacco: Never Used  Substance and Sexual Activity  . Alcohol use: No    Alcohol/week: 0.0 oz  . Drug use: No  . Sexual activity: None  Other Topics Concern  . None  Social History Narrative  . None    Review of Systems  Constitutional: Negative for fatigue and fever.  Respiratory: Negative for cough and shortness of breath.   Cardiovascular: Negative for chest pain.    Gastrointestinal: Negative for abdominal pain.  Genitourinary: Negative for dysuria.  Musculoskeletal: Negative for back pain.  Psychiatric/Behavioral: Negative for behavioral problems and confusion.    Vital Signs: BP (!) 151/91   Pulse (!) 120   Temp 98.2 F (36.8 C) (Oral)   Resp 18   Ht 5' 11"  (1.803 m)   Wt 159 lb (72.1 kg)   SpO2 99%   BMI 22.18 kg/m   Physical Exam  Constitutional: He is oriented to person, place, and time. He appears well-developed.  Cardiovascular: Regular rhythm and normal heart sounds.  tachycardia  Pulmonary/Chest: Effort normal and breath sounds normal. No respiratory distress.  Abdominal: Soft. Bowel sounds are normal.  Musculoskeletal: Normal range of motion.  Neurological: He is alert and oriented to person, place, and time.  Skin: Skin is warm and dry.  Psychiatric: He has a normal mood and affect. His behavior is normal. Judgment and thought content normal.  Nursing note and vitals reviewed.   Imaging: [place] Transrectal Complete  Result Date: 05/24/2017 Please see Notes or Procedures tab for imaging impression.  [place] Prostate Biopsy Multiple  Result Date: 05/24/2017 Please see Notes or Procedures tab for imaging impression.   Labs:  CBC: Recent Labs    04/17/17 1638 06/12/17 1208  WBC 8.9 6.7  HGB 13.1* 11.3*  HCT 37.8* 33.2*  PLT 209 140*    COAGS: Recent Labs    06/12/17 1208  INR 1.22    BMP: Recent Labs    11/15/16 0858 04/17/17 1638 04/24/17 1107 06/12/17 1208  NA 139 142 143 139  K 4.8 4.4 4.5 6.3*  CL 100 108 111* 104  CO2 22 24 25  16*  GLUCOSE 102* 86 102* 107*  BUN 40* 48* 44* PENDING  CALCIUM 10.1 9.6 9.4 9.7  CREATININE 1.55* 3.22* 2.95* 6.91*  GFRNONAA 48* 20*  --  8*  GFRAA 55* 23*  --  9*    LIVER FUNCTION TESTS: Recent Labs    11/15/16 0858 04/17/17 1638 04/24/17 1107  BILITOT 0.3 0.3  --   AST 12 11  --   ALT 16 8*  --   ALKPHOS 71  --   --   PROT 7.0 7.0 6.8  ALBUMIN 4.5  --    --     TUMOR MARKERS: No results for input(s): AFPTM, CEA, CA199, CHROMGRNA in the last 8760 hours.  Assessment and Plan: Patient with past medical history of HTN, MI presents with recent diagnosis of prostate cancer and left hydronephrosis.  IR consulted for left percutaneous nephroureteral stent placement at the request of Dr. Pilar Jarvis. Patient presents today with no complaints and in his usual state of health per his and wife's report, however is found to have tachycardia, elevated SCr from baseline 2.95  6.91, and hyperkalemia.  EKG shows sinus tachycardia with peaked T waves.  Discussed with Dr. Kathlene Cote who has met with patient  and family.   Will cancel procedure today and contact hospitalist team to evaluate for possible admission.  Will continue to work towards procedure once medically stable.   Thank you for this interesting consult.  I greatly enjoyed meeting Michael Small and look forward to participating in their care.  A copy of this report was sent to the requesting provider on this date.  Electronically Signed: Docia Barrier, PA 06/12/2017, 12:55 PM   I spent a total of  30 Minutes   in face to face in clinical consultation, greater than 50% of which was counseling/coordinating care for hydronephrosis

## 2017-06-12 NOTE — H&P (Signed)
[place] [facility] [place] NAME: Michael Small    MR#:  [id number]  DATE OF BIRTH:  [birth year]  DATE OF ADMISSION:  06/12/2017  PRIMARY CARE PHYSICIAN: Birdie Sons, MD   REQUESTING/REFERRING PHYSICIAN: Dr. Kathlene Cote  CHIEF COMPLAINT:  No chief complaint on file.   HISTORY OF PRESENT ILLNESS:  Michael Small  is a 62 y.o. male with a known history of hypertension, CAD status post stent about 5 years ago, recent diagnosis of prostate cancer 3 weeks ago presents to hospital from radiology suite secondary to abnormal labs. Patient has been having trouble with weight loss for almost 6 months now. He had an outpatient CT abdomen done which showed enlarged prostate and bilateral hydroureteral nephrosis with significantly elevated PSA levels on blood work. Has seen a urologist and had prostate biopsy done about 3 weeks ago and a right ureteral stent placement was done. He was referred to radiology for percutaneous left-sided nephroureteral stent placement. 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 He has an appointment with oncology later this week. Today he showed up for his stent placement and his labs indicate that his renal function has worsened from 2.952 months ago to 6.91. And also has a potassium of 6.3. His appetite has been poor for the last couple of months. His fluid intake has been decent according to the wife. In general appears very malnourished. He has had chronic diarrhea and takes Lomotil as needed. He does not take any medications that can cause hyperkalemia. However he has been taking ibuprofen for pain as needed. Denies any fevers or chills. No nausea or vomiting.  PAST MEDICAL HISTORY:   Past Medical History:  Diagnosis Date  . Depression   . Hyperlipidemia   . Hypertension   . Myocardial infarction [facility])    s/p stent  . Prostate cancer ([place])     PAST SURGICAL HISTORY:   Past Surgical History:  Procedure  Laterality Date  . CORONARY ANGIOPLASTY WITH STENT PLACEMENT  12/2011   [facility]  . CYSTOSCOPY W/ RETROGRADES Bilateral 05/24/2017   Procedure: CYSTOSCOPY WITH RETROGRADE PYELOGRAM;  Surgeon: [name], MD;  Location: [facility] ORS;  Service: Urology;  Laterality: Bilateral;  . CYSTOSCOPY WITH STENT PLACEMENT Right 05/24/2017   Procedure: CYSTOSCOPY WITH STENT PLACEMENT;  Surgeon: [name], MD;  Location: [facility] ORS;  Service: Urology;  Laterality: Right;  . PROSTATE BIOPSY N/A 05/24/2017   Procedure: PROSTATE BIOPSY;  Surgeon: [name], MD;  Location: [facility] ORS;  Service: Urology;  Laterality: N/A;  . US ECHOCARDIOGRAPHY  12/2011   Mild LV dysfunction, EF=45%    SOCIAL HISTORY:   Social History   Tobacco Use  . Smoking status: Current Every Day Smoker    Packs/day: 2.00    Years: 48.00    Pack years: 96.00    Types: Cigars  . Smokeless tobacco: Never Used  . Tobacco comment: used to amoke 2PPD- lately cut down to 1/2 PPD  Substance Use Topics  . 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 Cancer Mother   . Hypertension Mother   . Diabetes Father   . Heart disease Father   . Cancer Paternal Grandfather   . Prostate cancer Neg Hx   . Bladder Cancer  Neg Hx   . Kidney cancer Neg Hx     DRUG ALLERGIES:  No Known Allergies  REVIEW OF SYSTEMS:   Review of Systems  Constitutional: Positive for weight loss. Negative for chills, fever and malaise/fatigue.  HENT: Negative for ear discharge, ear pain, hearing loss and nosebleeds.   Eyes: Negative for blurred vision, double vision and photophobia.  Respiratory: Negative for cough, hemoptysis, shortness of breath and wheezing.   Cardiovascular: Negative for chest pain, palpitations, orthopnea and leg swelling.  Gastrointestinal: Positive for diarrhea. Negative for  abdominal pain, constipation, heartburn, melena, nausea and vomiting.  Genitourinary: Positive for frequency. Negative for dysuria and urgency.  Musculoskeletal: Negative for back pain, myalgias and neck pain.  Skin: Negative for rash.  Neurological: Positive for weakness. Negative for dizziness, tremors, sensory change, speech change, focal weakness and headaches.  Endo/Heme/Allergies: Does not bruise/bleed easily.  Psychiatric/Behavioral: Negative for depression.    MEDICATIONS AT HOME:   Prior to Admission medications   Medication Sig Start Date End Date Taking? Authorizing Provider  amLODipine (NORVASC) 5 MG tablet TAKE 1 TABLET BY MOUTH EVERY DAY 05/18/17  Yes Birdie Sons, MD  aspirin 81 MG tablet Take 81 mg by mouth daily.    Yes [provider]  diphenoxylate-atropine (LOMOTIL) 2.5-0.025 MG tablet TAKE 1 TABLET BY MOUTH 2 (TWO) TIMES DAILY AS NEEDED FOR DIARRHEA OR LOOSE STOOLS. 05/05/17  Yes Birdie Sons, MD  HYDROcodone-acetaminophen (NORCO/VICODIN) 5-325 MG tablet Take 1 tablet by mouth every 4 (four) hours as needed for moderate pain. 06/05/17  Yes Birdie Sons, MD  tamsulosin (FLOMAX) 0.4 MG CAPS capsule Take 1 capsule (0.4 mg total) by mouth daily. 04/28/17  Yes Birdie Sons, MD  ibuprofen (ADVIL,MOTRIN) 200 MG tablet Take 200-400 mg by mouth 2 (two) times daily as needed for headache or moderate pain.     [provider]      VITAL SIGNS:  Blood pressure (!) 151/91, pulse (!) 120, temperature 98.2 F (36.8 C), temperature source Oral, resp. rate 18, height 5\' 11"  (1.803 m), weight 72.1 kg (159 lb), SpO2 99 %.  PHYSICAL EXAMINATION:   Physical Exam  GENERAL:  62 y.o.-year-old malnourished and thin built, patient lying in the bed with no acute distress.  EYES: Pupils equal, round, reactive to light and accommodation. No scleral icterus. Extraocular muscles intact.  HEENT: Head atraumatic, normocephalic. Oropharynx and nasopharynx clear.    NECK:  Supple, no jugular venous distention. No thyroid enlargement, no tenderness.  LUNGS: Normal breath sounds bilaterally, no wheezing, rales,rhonchi or crepitation. No use of accessory muscles of respiration. Scant breath sounds bilateral bases CARDIOVASCULAR: S1, S2 normal. No murmurs, rubs, or gallops.  ABDOMEN: Soft, nontender, nondistended. Bowel sounds present. No organomegaly or mass.  EXTREMITIES: No cyanosis, or clubbing. 2+ bilateral leg edema noted NEUROLOGIC: Cranial nerves II through XII are intact. Muscle strength 5/5 in all extremities. Sensation intact. Gait not checked. Global weakness present PSYCHIATRIC: The patient is alert and oriented x 3.  SKIN: No obvious rash, lesion, or ulcer.   LABORATORY PANEL:   CBC Recent Labs  Lab 06/12/17 1208  WBC 6.7  HGB 11.3*  HCT 33.2*  PLT 140*   ------------------------------------------------------------------------------------------------------------------  Chemistries  Recent Labs  Lab 06/12/17 1208  NA 139  K 6.3*  CL 104  CO2 16*  GLUCOSE 107*  BUN 108*  CREATININE 6.91*  CALCIUM 9.7   ------------------------------------------------------------------------------------------------------------------  Cardiac Enzymes No results for input(s): TROPONINI in the last 168 hours. ------------------------------------------------------------------------------------------------------------------  RADIOLOGY:  No results found.  EKG:   Orders placed or performed during the hospital encounter of 06/12/17  . EKG 12-Lead  . EKG 12-Lead  . EKG 12-Lead  . EKG 12-Lead    IMPRESSION AND PLAN:   Michael Small  is a 62 y.o. male with a known history of hypertension, CAD status post stent about 5 years ago, recent diagnosis of prostate cancer 3 weeks ago presents to hospital from radiology suite secondary to abnormal labs.  1. Hyperkalemia-secondary to acute renal failure. -Treat with Kayexalate, calcium gluconate and  insulin dextrose as he has EKG changes with peaked T waves. -Monitor on telemetry med 3. Repeat labs again in a few hours  2. 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 -He feels like his stream has been improved since his stent placement. And his frequency of urination has increased too. -Could be ATN and also interstitial nephritis. Was also taking ibuprofen at home. -Strict input and output monitoring, Foley catheter placement. -Nephrology consult. Avoid nephrotoxins. Gentle hydration.  3. Hypertension-continue Norvasc  4. Prostate cancer-follow-up with oncology as schedule later this week. Also follows with urology as outpatient. Status post hormonal injection done  5. CAD status post stent-no active cardiac symptoms. Hold aspirin as he needs a procedure.  6. DVT prophylaxis heparin subcutaneous heparin    All the records are reviewed and case discussed with ED provider. Management plans discussed with the patient, family and they are in agreement.  CODE STATUS: Full Code  TOTAL TIME TAKING CARE OF THIS PATIENT: 50 minutes.    [name] M.D on 06/12/2017 at 2:05 PM  Between 7am to 6pm - Pager - [phone]  After 6pm go to [email or web address] - password [email or web address] [email or web address] [facility]  Office  [phone]  CC: Primary care physician; Birdie Sons, MD

## 2017-06-12 NOTE — Progress Notes (Signed)
PRE HD assessment

## 2017-06-12 NOTE — Progress Notes (Addendum)
Transported via bed to ICU 2 with belongings.  No distress noted.  Wife and son notified of transfer.

## 2017-06-12 NOTE — Progress Notes (Signed)
Repeat potassium at 7.2 and worsening renal failure. Patient hasn't made much urine in the last few hours. -Discussed with nephrology and also ICU nurse practitioner. We will move the patient to ICU for stat temporary dialysis catheter placement and will initiate dialysis. Nephrology has spoken to patient about dialysis and patient has agreed for the same.

## 2017-06-12 NOTE — Plan of Care (Signed)
  Progressing Education: Knowledge of General Education information will improve 06/12/2017 1747 - Progressing by Darrelyn Hillock, RN Health Behavior/Discharge Planning: Ability to manage health-related needs will improve 06/12/2017 1747 - Progressing by Darrelyn Hillock, RN Nutrition: Adequate nutrition will be maintained 06/12/2017 1747 - Progressing by Darrelyn Hillock, RN Coping: Level of anxiety will decrease 06/12/2017 1747 - Progressing by Darrelyn Hillock, RN Pain Managment: General experience of comfort will improve 06/12/2017 1747 - Progressing by Darrelyn Hillock, RN Safety: Ability to remain free from injury will improve 06/12/2017 1747 - Progressing by Darrelyn Hillock, RN Education: Knowledge of disease or condition will improve 06/12/2017 1747 - Progressing by Darrelyn Hillock, RN Understanding of medication regimen will improve 06/12/2017 1747 - Progressing by Darrelyn Hillock, RN Activity: Ability to tolerate increased activity will improve 06/12/2017 1747 - Progressing by Darrelyn Hillock, RN Cardiac: Ability to achieve and maintain adequate cardiopulmonary perfusion will improve 06/12/2017 1747 - Progressing by Darrelyn Hillock, RN Health Behavior/Discharge Planning: Ability to safely manage health-related needs after discharge will improve 06/12/2017 1747 - Progressing by Darrelyn Hillock, RN

## 2017-06-12 NOTE — Progress Notes (Signed)
HD started. 

## 2017-06-12 NOTE — Procedures (Signed)
Hemodialysis Catheter Insertion Procedure Note Michael Small [id number] 12/04/1955  Procedure: Insertion of Hemodialysis Catheter Indications: Dialysis Access   Procedure Details Consent: Risks of procedure as well as the alternatives and risks of each were explained to the (patient/caregiver).  Consent for procedure obtained. Time Out: Verified patient identification, verified procedure, site/side was marked, verified correct patient position, special equipment/implants available, medications/allergies/relevent history reviewed, required imaging and test results available.  Performed  Maximum sterile technique was used including antiseptics, cap, gloves, gown, hand hygiene, mask and sheet. Skin prep: Chlorhexidine; local anesthetic administered Double lumen hemodialysis catheter was inserted into right femoral vein due to multiple attempts, no other available access using the Seldinger technique.  Evaluation Blood flow good Complications: No apparent complications Patient did tolerate procedure well. Chest X-ray ordered to verify placement.  CXR: not indicated .   Right femoral temporary dialysis catheter placed utilizing ultrasound no complications noted during or following procedure.  [name], [place] Pager [phone] (please enter 7 digits) PCCM Consult Pager [phone] (please enter 7 digits)

## 2017-06-12 NOTE — H&P (Signed)
[facility] [facility] [place] NAME: Michael Small    MR#:  [id number]  DATE OF BIRTH:  07/08/1955  DATE OF ADMISSION:  06/12/2017  PRIMARY CARE PHYSICIAN: Birdie Sons, MD   REQUESTING/REFERRING PHYSICIAN: Dr. Kathlene Cote  CHIEF COMPLAINT:  No chief complaint on file.   HISTORY OF PRESENT ILLNESS:  Michael Small  is a 62 y.o. male with a known history of hypertension, CAD status post stent about 5 years ago, recent diagnosis of prostate cancer 3 weeks ago presents to hospital from radiology suite secondary to abnormal labs. Patient has been having trouble with weight loss for almost 6 months now. He had an outpatient CT abdomen done which showed enlarged prostate and bilateral hydroureteral nephrosis with significantly elevated PSA levels on blood work. Has seen a urologist and had prostate biopsy done about 3 weeks ago and a right ureteral stent placement was done. He was referred to radiology for percutaneous left-sided nephroureteral stent placement. 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 He has an appointment with oncology later this week. Today he showed up for his stent placement and his labs indicate that his renal function has worsened from 2.952 months ago to 6.91. And also has a potassium of 6.3. His appetite has been poor for the last couple of months. His fluid intake has been decent according to the wife. In general appears very malnourished. He has had chronic diarrhea and takes Lomotil as needed. He does not take any medications that can cause hyperkalemia. However he has been taking ibuprofen for pain as needed. Denies any fevers or chills. No nausea or vomiting.  PAST MEDICAL HISTORY:   Past Medical History:  Diagnosis Date  . Depression   . Hyperlipidemia   . Hypertension   . Myocardial infarction [facility])    s/p stent  . Prostate cancer ([place])     PAST SURGICAL HISTORY:   Past Surgical History:  Procedure  Laterality Date  . CORONARY ANGIOPLASTY WITH STENT PLACEMENT  12/2011   [facility]  . CYSTOSCOPY W/ RETROGRADES Bilateral 05/24/2017   Procedure: CYSTOSCOPY WITH RETROGRADE PYELOGRAM;  Surgeon: [name], MD;  Location: [facility] ORS;  Service: Urology;  Laterality: Bilateral;  . CYSTOSCOPY WITH STENT PLACEMENT Right 05/24/2017   Procedure: CYSTOSCOPY WITH STENT PLACEMENT;  Surgeon: [name], MD;  Location: [facility] ORS;  Service: Urology;  Laterality: Right;  . PROSTATE BIOPSY N/A 05/24/2017   Procedure: PROSTATE BIOPSY;  Surgeon: [name], MD;  Location: [facility] ORS;  Service: Urology;  Laterality: N/A;  . US ECHOCARDIOGRAPHY  12/2011   Mild LV dysfunction, EF=45%    SOCIAL HISTORY:   Social History   Tobacco Use  . Smoking status: Current Every Day Smoker    Packs/day: 2.00    Years: 48.00    Pack years: 96.00    Types: Cigars  . Smokeless tobacco: Never Used  . Tobacco comment: used to amoke 2PPD- lately cut down to 1/2 PPD  Substance Use Topics  . 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 Cancer Mother   . Hypertension Mother   . Diabetes Father   . Heart disease Father   . Cancer Paternal Grandfather   . Prostate cancer Neg Hx   . Bladder Cancer  Neg Hx   . Kidney cancer Neg Hx     DRUG ALLERGIES:  No Known Allergies  REVIEW OF SYSTEMS:   Review of Systems  Constitutional: Positive for weight loss. Negative for chills, fever and malaise/fatigue.  HENT: Negative for ear discharge, ear pain, hearing loss and nosebleeds.   Eyes: Negative for blurred vision, double vision and photophobia.  Respiratory: Negative for cough, hemoptysis, shortness of breath and wheezing.   Cardiovascular: Negative for chest pain, palpitations, orthopnea and leg swelling.  Gastrointestinal: Positive for diarrhea. Negative for  abdominal pain, constipation, heartburn, melena, nausea and vomiting.  Genitourinary: Positive for frequency. Negative for dysuria and urgency.  Musculoskeletal: Negative for back pain, myalgias and neck pain.  Skin: Negative for rash.  Neurological: Positive for weakness. Negative for dizziness, tremors, sensory change, speech change, focal weakness and headaches.  Endo/Heme/Allergies: Does not bruise/bleed easily.  Psychiatric/Behavioral: Negative for depression.    MEDICATIONS AT HOME:   Prior to Admission medications   Medication Sig Start Date End Date Taking? Authorizing Provider  amLODipine (NORVASC) 5 MG tablet TAKE 1 TABLET BY MOUTH EVERY DAY 05/18/17  Yes Birdie Sons, MD  aspirin 81 MG tablet Take 81 mg by mouth daily.    Yes [provider]  diphenoxylate-atropine (LOMOTIL) 2.5-0.025 MG tablet TAKE 1 TABLET BY MOUTH 2 (TWO) TIMES DAILY AS NEEDED FOR DIARRHEA OR LOOSE STOOLS. 05/05/17  Yes Birdie Sons, MD  HYDROcodone-acetaminophen (NORCO/VICODIN) 5-325 MG tablet Take 1 tablet by mouth every 4 (four) hours as needed for moderate pain. 06/05/17  Yes Birdie Sons, MD  tamsulosin (FLOMAX) 0.4 MG CAPS capsule Take 1 capsule (0.4 mg total) by mouth daily. 04/28/17  Yes Birdie Sons, MD  ibuprofen (ADVIL,MOTRIN) 200 MG tablet Take 200-400 mg by mouth 2 (two) times daily as needed for headache or moderate pain.     [provider]      VITAL SIGNS:  Blood pressure (!) 145/64, pulse (!) 102, temperature 98.4 F (36.9 C), temperature source Oral, resp. rate 18, height 5\' 11"  (1.803 m), weight 70.3 kg (155 lb), SpO2 98 %.  PHYSICAL EXAMINATION:   Physical Exam  GENERAL:  62 y.o.-year-old malnourished and thin built, patient lying in the bed with no acute distress.  EYES: Pupils equal, round, reactive to light and accommodation. No scleral icterus. Extraocular muscles intact.  HEENT: Head atraumatic, normocephalic. Oropharynx and nasopharynx clear.    NECK:  Supple, no jugular venous distention. No thyroid enlargement, no tenderness.  LUNGS: Normal breath sounds bilaterally, no wheezing, rales,rhonchi or crepitation. No use of accessory muscles of respiration. Scant breath sounds bilateral bases CARDIOVASCULAR: S1, S2 normal. No murmurs, rubs, or gallops.  ABDOMEN: Soft, nontender, nondistended. Bowel sounds present. No organomegaly or mass.  EXTREMITIES: No cyanosis, or clubbing. 2+ bilateral leg edema noted NEUROLOGIC: Cranial nerves II through XII are intact. Muscle strength 5/5 in all extremities. Sensation intact. Gait not checked. Global weakness present PSYCHIATRIC: The patient is alert and oriented x 3.  SKIN: No obvious rash, lesion, or ulcer.   LABORATORY PANEL:   CBC Recent Labs  Lab 06/12/17 1208  WBC 6.7  HGB 11.3*  HCT 33.2*  PLT 140*   ------------------------------------------------------------------------------------------------------------------  Chemistries  Recent Labs  Lab 06/12/17 1208  NA 139  K 6.3*  CL 104  CO2 16*  GLUCOSE 107*  BUN 108*  CREATININE 6.91*  CALCIUM 9.7   ------------------------------------------------------------------------------------------------------------------  Cardiac Enzymes No results for input(s): TROPONINI in the last 168 hours. ------------------------------------------------------------------------------------------------------------------  RADIOLOGY:  US Renal  Result Date: 06/12/2017 CLINICAL DATA:  62 year old male with acute renal failure. Initial encounter. EXAM: RENAL / URINARY TRACT ULTRASOUND COMPLETE COMPARISON:  04/27/2017 CT. FINDINGS: Right Kidney: Length: 11 cm. Mild increased echogenicity renal parenchyma. Moderate right hydronephrosis. Stent in place. Left Kidney: Length: 12.7 cm.  Moderate hydronephrosis. Bladder: Thickening of the posterior bladder spanning over 6.4 x 2.6.9 cm. Question tumor. IMPRESSION: Moderate bilateral hydronephrosis as  noted on prior CT. Right-sided stent has been placed. Thickened posterior bladder wall. Although prostate gland is enlarged and impressing upon the bladder, primary bladder lesion is a consideration. These results will be called to the ordering clinician or representative by the Radiologist Assistant, and communication documented in the PACS or zVision Dashboard. Electronically Signed   By: Genia Del M.D.   On: 06/12/2017 17:25   US Venous Img Lower Bilateral  Result Date: 06/12/2017 CLINICAL DATA:  Bilateral lower extremity pain and edema for the past 6 months. History of dog bite injury involving the right leg approximately 6-7 months ago. History of malignancy. Evaluate for DVT. EXAM: BILATERAL LOWER EXTREMITY VENOUS DOPPLER ULTRASOUND TECHNIQUE: Gray-scale sonography with graded compression, as well as color Doppler and duplex ultrasound were performed to evaluate the lower extremity deep venous systems from the level of the common femoral vein and including the common femoral, femoral, profunda femoral, popliteal and calf veins including the posterior tibial, peroneal and gastrocnemius veins when visible. The superficial great saphenous vein was also interrogated. Spectral Doppler was utilized to evaluate flow at rest and with distal augmentation maneuvers in the common femoral, femoral and popliteal veins. COMPARISON:  None. FINDINGS: RIGHT LOWER EXTREMITY Common Femoral Vein: No evidence of thrombus. Normal compressibility, respiratory phasicity and response to augmentation. Saphenofemoral Junction: No evidence of thrombus. Normal compressibility and flow on color Doppler imaging. Profunda Femoral Vein: No evidence of thrombus. Normal compressibility and flow on color Doppler imaging. Femoral Vein: No evidence of thrombus. Normal compressibility, respiratory phasicity and response to augmentation. Popliteal Vein: No evidence of thrombus. Normal compressibility, respiratory phasicity and response to  augmentation. Calf Veins: No evidence of thrombus. Normal compressibility and flow on color Doppler imaging. Superficial Great Saphenous Vein: No evidence of thrombus. Normal compressibility. Venous Reflux:  None. Other Findings: Note is made of several prominent though non pathologically enlarged right inguinal lymph nodes with dominant inguinal lymph node measuring approximately 0.9 cm in greatest short axis diameter and maintaining a benign fatty hilum. LEFT LOWER EXTREMITY Common Femoral Vein: No evidence of thrombus. Normal compressibility, respiratory phasicity and response to augmentation. Saphenofemoral Junction: No evidence of thrombus. Normal compressibility and flow on color Doppler imaging. Profunda Femoral Vein: No evidence of thrombus. Normal compressibility and flow on color Doppler imaging. Femoral Vein: No evidence of thrombus. Normal compressibility, respiratory phasicity and response to augmentation. Popliteal Vein: No evidence of thrombus. Normal compressibility, respiratory phasicity and response to augmentation. Calf Veins: No evidence of thrombus. Normal compressibility and flow on color Doppler imaging. Superficial Great Saphenous Vein: Note is made of several prominent though non pathologically enlarged left inguinal lymph nodes with dominant left inguinal lymph node measuring 0.7 cm in greatest short axis diameter. Venous Reflux:  None. Other Findings:  None. IMPRESSION: No evidence of DVT within either lower extremity. Electronically Signed   By: Sandi Mariscal M.D.   On: 06/12/2017 16:42    EKG:   Orders placed or performed during the hospital encounter of 06/12/17  . EKG 12-Lead  . EKG 12-Lead  . EKG  12-Lead  . EKG 12-Lead    IMPRESSION AND PLAN:   Michael Small  is a 62 y.o. male with a known history of hypertension, CAD status post stent about 5 years ago, recent diagnosis of prostate cancer 3 weeks ago presents to hospital from radiology suite secondary to abnormal  labs.  1. Hyperkalemia-secondary to acute renal failure. -Treat with Kayexalate, calcium gluconate and insulin dextrose as he has EKG changes with peaked T waves. -Monitor on telemetry med 3. Repeat labs again in a few hours- if no improvement, discussed about dialysis.  2. 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 -He feels like his stream has been improved since his stent placement. And his frequency of urination has increased too. -Could be ATN and also interstitial nephritis. Was also taking ibuprofen at home. -Strict input and output monitoring, Foley catheter placement. -Nephrology consult. Avoid nephrotoxins. Gentle hydration.  3. Hypertension-continue Norvasc  4. Prostate cancer-follow-up with oncology as schedule later this week. Also follows with urology as outpatient. Status post hormonal injection done  5. CAD status post stent-no active cardiac symptoms. Hold aspirin as he needs a procedure.  6. DVT prophylaxis heparin subcutaneous heparin    All the records are reviewed and case discussed with ED provider. Management plans discussed with the patient, family and they are in agreement.  CODE STATUS: Full Code  TOTAL CRITICAL CARE  TIME SPENT IN TAKING CARE OF THIS PATIENT: 65 minutes.    [name] M.D on 06/12/2017 at 7:24 PM  Between 7am to 6pm - Pager - [phone]  After 6pm go to [email or web address] - password [email or web address] [facility]  Office  [phone]  CC: Primary care physician; Birdie Sons, MD

## 2017-06-12 NOTE — Consult Note (Signed)
Date: 06/12/2017                  Patient Name:  Michael Small  MRN: [id number]  DOB: Mar 01, 1956  Age / Sex: 63 y.o., male         PCP: Birdie Sons, MD                 Service Requesting Consult: IM/ [name], MD                 Reason for Consult: ARF, Hyperkalemia            History of Present Illness: Patient is a 62 y.o. male with medical problems of hypertension, recently diagnosed prostate cancer, who was admitted to [facility] on 06/12/2017 for evaluation of acute renal failure and hyperkalemia.  Patient was recently diagnosed with prostate cancer. He was seen by urology for bilateral hydroureteronephrosis on dec 26.  Recent CT showed abdominal lymphadenopathy.  Patient also has had significant weight loss of 46 pounds over the last 6 months.  His appetite is very poor.  He had a prostate biopsy and recent right renal stent placement at the same time He presented today to the hospital for outpatient percutaneous left nephroureteral stent placement but was found to have severe hyperkalemia of 6.3 and acute renal failure with creatinine of 6.91.  He was admitted by the hospitalist service and now nephrology consult has been obtained for further evaluation.   Medications: Outpatient medications: Medications Prior to Admission  Medication Sig Dispense Refill Last Dose  . amLODipine (NORVASC) 5 MG tablet TAKE 1 TABLET BY MOUTH EVERY DAY 30 tablet 5 06/12/2017 at Unknown time  . aspirin 81 MG tablet Take 81 mg by mouth daily.    Past Month at Unknown time  . diphenoxylate-atropine (LOMOTIL) 2.5-0.025 MG tablet TAKE 1 TABLET BY MOUTH 2 (TWO) TIMES DAILY AS NEEDED FOR DIARRHEA OR LOOSE STOOLS. 30 tablet 1 06/12/2017 at Unknown time  . HYDROcodone-acetaminophen (NORCO/VICODIN) 5-325 MG tablet Take 1 tablet by mouth every 4 (four) hours as needed for moderate pain. 60 tablet 0 06/12/2017 at Unknown time  . ibuprofen (ADVIL,MOTRIN) 200 MG tablet Take 200-400 mg by mouth 2 (two) times  daily as needed for headache or moderate pain.    Not Taking at Unknown time  . tamsulosin (FLOMAX) 0.4 MG CAPS capsule Take 1 capsule (0.4 mg total) by mouth daily. 30 capsule 3 06/12/2017 at Unknown time    Current medications: Current Facility-Administered Medications  Medication Dose Route Frequency Provider Last Rate Last Dose  . acetaminophen (TYLENOL) tablet 650 mg  650 mg Oral Q6H PRN [name], MD       Or  . acetaminophen (TYLENOL) suppository 650 mg  650 mg Rectal Q6H PRN [name], MD      . amLODipine (NORVASC) tablet 5 mg  5 mg Oral Daily [name], MD      . heparin injection 5,000 Units  5,000 Units Subcutaneous Q8H [name], MD      . ondansetron [facility]) tablet 4 mg  4 mg Oral Q6H PRN [name], MD       Or  . ondansetron (ZOFRAN) injection 4 mg  4 mg Intravenous Q6H PRN [name], MD      . sodium bicarbonate 150 mEq in sterile water 1,000 mL infusion   Intravenous Continuous [name], MD      . tamsulosin (FLOMAX) capsule 0.4 mg  0.4 mg Oral Daily Kalisetti,  Hart Rochester, MD       Facility-Administered Medications Ordered in Other Encounters  Medication Dose Route Frequency Provider Last Rate Last Dose  . 0.9 %  sodium chloride infusion   Intravenous Continuous [name], MD      . acetaminophen (TYLENOL) tablet 650 mg  650 mg Oral Q6H PRN [name], MD       Or  . acetaminophen (TYLENOL) suppository 650 mg  650 mg Rectal Q6H PRN [name], MD      . amLODipine (NORVASC) tablet 5 mg  5 mg Oral Daily [name], MD      . calcium gluconate 1 g in sodium chloride 0.9 % 100 mL IVPB  1 g Intravenous Once [name], MD      . ceFAZolin (ANCEF) IVPB 2g/100 mL premix  2 g Intravenous Once Brynda Greathouse Sue-Ellen, PA      . dextrose 50 % solution 25 mL  25 mL Intravenous Once [name], MD      . diphenoxylate-atropine (LOMOTIL) 2.5-0.025 MG per tablet 1 tablet  1  tablet Oral BID PRN [name], MD      . fentaNYL (SUBLIMAZE) 100 MCG/2ML injection           . heparin injection 5,000 Units  5,000 Units Subcutaneous Q8H [name], MD      . HYDROcodone-acetaminophen (NORCO/VICODIN) 5-325 MG per tablet 1 tablet  1 tablet Oral Q4H PRN [name], MD      . insulin aspart (novoLOG) injection 10 Units  10 Units Intravenous Once [name], MD      . ondansetron [facility]) tablet 4 mg  4 mg Oral Q6H PRN [name], MD       Or  . ondansetron (ZOFRAN) injection 4 mg  4 mg Intravenous Q6H PRN [name], MD      . sodium polystyrene (KAYEXALATE) 15 GM/60ML suspension 30 g  30 g Oral Once [name], MD      . tamsulosin (FLOMAX) capsule 0.4 mg  0.4 mg Oral Daily [name], MD          Allergies: No Known Allergies    Past Medical History: Past Medical History:  Diagnosis Date  . Depression   . Hyperlipidemia   . Hypertension   . Myocardial infarction [facility])    s/p stent  . Prostate cancer [facility])      Past Surgical History: Past Surgical History:  Procedure Laterality Date  . CORONARY ANGIOPLASTY WITH STENT PLACEMENT  12/2011   [facility]  . CYSTOSCOPY W/ RETROGRADES Bilateral 05/24/2017   Procedure: CYSTOSCOPY WITH RETROGRADE PYELOGRAM;  Surgeon: [name], MD;  Location: [facility] ORS;  Service: Urology;  Laterality: Bilateral;  . CYSTOSCOPY WITH STENT PLACEMENT Right 05/24/2017   Procedure: CYSTOSCOPY WITH STENT PLACEMENT;  Surgeon: [name], MD;  Location: [facility] ORS;  Service: Urology;  Laterality: Right;  . PROSTATE BIOPSY N/A 05/24/2017   Procedure: PROSTATE BIOPSY;  Surgeon: [name], MD;  Location: [facility] ORS;  Service: Urology;  Laterality: N/A;  . US ECHOCARDIOGRAPHY  12/2011   Mild LV dysfunction, EF=45%     Family History: Family History  Problem Relation Age of Onset  . Cancer Mother   . Hypertension Mother   . Diabetes Father   . Heart  disease Father   . Cancer Paternal Grandfather   . Prostate cancer Neg Hx   . Bladder Cancer Neg Hx   . Kidney cancer Neg Hx  Social History: Social History   Socioeconomic History  . Marital status: Married    Spouse name: Not on file  . Number of children: Not on file  . Years of education: 15  . Highest education level: Not on file  Social Needs  . Financial resource strain: Not on file  . Food insecurity - worry: Not on file  . Food insecurity - inability: Not on file  . Transportation needs - medical: Not on file  . Transportation needs - non-medical: Not on file  Occupational History  . Occupation: Disabled   Tobacco Use  . Smoking status: Current Every Day Smoker    Packs/day: 2.00    Years: 48.00    Pack years: 96.00    Types: Cigars  . Smokeless tobacco: Never Used  . Tobacco comment: used to amoke 2PPD- lately cut down to 1/2 PPD  Substance and Sexual Activity  . 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 Drug use: No  . Sexual activity: Not on file  Other Topics Concern  . Not on file  Social History Narrative   Used to be in Architect, very independent but lately over the past few months- due to weakness, limited activity only.     Review of Systems: Gen: Weight loss of over 40 pounds over the last 6 months, no fevers or chills HEENT: No vision or hearing problems reported CV: No chest pain or shortness of breath Resp: No cough or sputum production GI: Appetite has been poor GU : Blood in the urine after stent placement, recent metastatic prostate cancer diagnosis MS: No acute complaints Derm:  No complaints Psych: No complaints Heme: No complaints Neuro: No complaints Endocrine.  No complaints  Vital Signs: Blood pressure (!) 145/64, pulse (!) 102, temperature 98.4 F (36.9 C), temperature source Oral, resp. rate 18, height 5\' 11"  (1.803 m), weight 70.3 kg (155 lb), SpO2  98 %.  No intake or output data in the 24 hours ending 06/12/17 1826  Weight trends: Filed Weights   06/12/17 1733  Weight: 70.3 kg (155 lb)    Physical Exam: General:  Thin, no acute distress  HEENT  anicteric, moist oral mucous membranes  Neck:  Supple, no distended neck veins  Lungs:  Normal breathing effort, clear to auscultation  Heart::  Regular rhythm, no rub or gallop  Abdomen:  Soft, nontender  Extremities:  No peripheral edema  Neurologic:  Alert, oriented  Skin:  No acute rashes             Lab results: Basic Metabolic Panel: Recent Labs  Lab 06/12/17 1208  NA 139  K 6.3*  CL 104  CO2 16*  GLUCOSE 107*  BUN 108*  CREATININE 6.91*  CALCIUM 9.7    Liver Function Tests: No results for input(s): AST, ALT, ALKPHOS, BILITOT, PROT, ALBUMIN in the last 168 hours. No results for input(s): LIPASE, AMYLASE in the last 168 hours. No results for input(s): AMMONIA in the last 168 hours.  CBC: Recent Labs  Lab 06/12/17 1208  WBC 6.7  HGB 11.3*  HCT 33.2*  MCV 90.9  PLT 140*    Cardiac Enzymes: No results for input(s): CKTOTAL, TROPONINI in the last 168 hours.  BNP: Invalid input(s): POCBNP  CBG: No results for input(s): GLUCAP in the last 168 hours.  Microbiology: Recent Results (from the past 720 hour(s))  Urine culture  Status: Abnormal   Collection Time: 05/16/17 11:02 AM  Result Value Ref Range Status   Specimen Description URINE, RANDOM  Final   Special Requests NONE  Final   Culture (A)  Final    <10,000 COLONIES/mL INSIGNIFICANT GROWTH Performed at [facility], [address] [place], [place] [address]    Report Status 05/17/2017 FINAL  Final     Coagulation Studies: Recent Labs    06/12/17 1208  LABPROT 15.3*  INR 1.22    Urinalysis: No results for input(s): COLORURINE, LABSPEC, PHURINE, GLUCOSEU, HGBUR, BILIRUBINUR, KETONESUR, PROTEINUR, UROBILINOGEN, NITRITE, LEUKOCYTESUR in the last 72 hours.  Invalid  input(s): APPERANCEUR      Imaging: US Renal  Result Date: 06/12/2017 CLINICAL DATA:  62 year old male with acute renal failure. Initial encounter. EXAM: RENAL / URINARY TRACT ULTRASOUND COMPLETE COMPARISON:  04/27/2017 CT. FINDINGS: Right Kidney: Length: 11 cm. Mild increased echogenicity renal parenchyma. Moderate right hydronephrosis. Stent in place. Left Kidney: Length: 12.7 cm.  Moderate hydronephrosis. Bladder: Thickening of the posterior bladder spanning over 6.4 x 2.6.9 cm. Question tumor. IMPRESSION: Moderate bilateral hydronephrosis as noted on prior CT. Right-sided stent has been placed. Thickened posterior bladder wall. Although prostate gland is enlarged and impressing upon the bladder, primary bladder lesion is a consideration. These results will be called to the ordering clinician or representative by the Radiologist Assistant, and communication documented in the PACS or zVision Dashboard. Electronically Signed   By: Genia Del M.D.   On: 06/12/2017 17:25   US Venous Img Lower Bilateral  Result Date: 06/12/2017 CLINICAL DATA:  Bilateral lower extremity pain and edema for the past 6 months. History of dog bite injury involving the right leg approximately 6-7 months ago. History of malignancy. Evaluate for DVT. EXAM: BILATERAL LOWER EXTREMITY VENOUS DOPPLER ULTRASOUND TECHNIQUE: Gray-scale sonography with graded compression, as well as color Doppler and duplex ultrasound were performed to evaluate the lower extremity deep venous systems from the level of the common femoral vein and including the common femoral, femoral, profunda femoral, popliteal and calf veins including the posterior tibial, peroneal and gastrocnemius veins when visible. The superficial great saphenous vein was also interrogated. Spectral Doppler was utilized to evaluate flow at rest and with distal augmentation maneuvers in the common femoral, femoral and popliteal veins. COMPARISON:  None. FINDINGS: RIGHT LOWER  EXTREMITY Common Femoral Vein: No evidence of thrombus. Normal compressibility, respiratory phasicity and response to augmentation. Saphenofemoral Junction: No evidence of thrombus. Normal compressibility and flow on color Doppler imaging. Profunda Femoral Vein: No evidence of thrombus. Normal compressibility and flow on color Doppler imaging. Femoral Vein: No evidence of thrombus. Normal compressibility, respiratory phasicity and response to augmentation. Popliteal Vein: No evidence of thrombus. Normal compressibility, respiratory phasicity and response to augmentation. Calf Veins: No evidence of thrombus. Normal compressibility and flow on color Doppler imaging. Superficial Great Saphenous Vein: No evidence of thrombus. Normal compressibility. Venous Reflux:  None. Other Findings: Note is made of several prominent though non pathologically enlarged right inguinal lymph nodes with dominant inguinal lymph node measuring approximately 0.9 cm in greatest short axis diameter and maintaining a benign fatty hilum. LEFT LOWER EXTREMITY Common Femoral Vein: No evidence of thrombus. Normal compressibility, respiratory phasicity and response to augmentation. Saphenofemoral Junction: No evidence of thrombus. Normal compressibility and flow on color Doppler imaging. Profunda Femoral Vein: No evidence of thrombus. Normal compressibility and flow on color Doppler imaging. Femoral Vein: No evidence of thrombus. Normal compressibility, respiratory phasicity and response to augmentation. Popliteal Vein:  No evidence of thrombus. Normal compressibility, respiratory phasicity and response to augmentation. Calf Veins: No evidence of thrombus. Normal compressibility and flow on color Doppler imaging. Superficial Great Saphenous Vein: Note is made of several prominent though non pathologically enlarged left inguinal lymph nodes with dominant left inguinal lymph node measuring 0.7 cm in greatest short axis diameter. Venous Reflux:  None.  Other Findings:  None. IMPRESSION: No evidence of DVT within either lower extremity. Electronically Signed   By: Sandi Mariscal M.D.   On: 06/12/2017 16:42      Assessment & Plan: Pt is a 62 y.o. Caucasian  male with hypertension, coronary atherosclerosis, emphysema, current smoker (1ppd) recently diagnosed metastatic prostate cancer, lymphadenopathy of the abdomen, bilateral hydronephrosis with right renal stent placement, was admitted on 06/12/2017 .  Patient had presented as outpatient for percutaneous left nephroureteral stent placement by vascular radiology but was found to have hyperkalemia and acute renal failure therefore he is admitted for further evaluation and management.  1.  Acute renal failure  2.  Chronic kidney disease st 3 with baseline creatinine 1.55/ GFR 48 from June 2018 3.  Metastatic prostate cancer with pelvic and abdominal adnopathy 4.  Bilateral hydronephrosis with right renal stent placement in December 2018 5.  Severe hyperkalemia  Acute renal failure is multifactorial but seems most likely to be related to bilateral hydronephrosis.  There is a possibility of right kidney stent failure leading to worsening of renal failure  Plan to control potassium with medical therapy. Once potassium is controlled, patient will likely need bilateral nephrostomy tubes Patient likely has mild uremic symptoms as he reports poor appetite and continued weight loss which could also be related to metastatic cancer. If despite nephrostomy tubes, renal function does not improve then he might need dialysis but will have to have a detailed discussion with patient regarding that.  We will follow closely

## 2017-06-12 NOTE — Progress Notes (Signed)
Patient k-level 7.3 md made aware , see new order

## 2017-06-12 NOTE — Progress Notes (Signed)
PRE HD   

## 2017-06-13 ENCOUNTER — Ambulatory Visit: Admission: RE | Admit: 2017-06-13 | Payer: Medicare Other | Source: Ambulatory Visit

## 2017-06-13 ENCOUNTER — Inpatient Hospital Stay: Payer: Medicare Other

## 2017-06-13 DIAGNOSIS — N1330 Unspecified hydronephrosis: Secondary | ICD-10-CM

## 2017-06-13 DIAGNOSIS — E43 Unspecified severe protein-calorie malnutrition: Secondary | ICD-10-CM

## 2017-06-13 DIAGNOSIS — N19 Unspecified kidney failure: Secondary | ICD-10-CM

## 2017-06-13 LAB — COMPREHENSIVE METABOLIC PANEL
ALBUMIN: 3 g/dL — AB (ref 3.5–5.0)
ALT: 10 U/L — ABNORMAL LOW (ref 17–63)
AST: 16 U/L (ref 15–41)
Alkaline Phosphatase: 83 U/L (ref 38–126)
Anion gap: 19 — ABNORMAL HIGH (ref 5–15)
BILIRUBIN TOTAL: 1.1 mg/dL (ref 0.3–1.2)
BUN: 69 mg/dL — AB (ref 6–20)
CO2: 23 mmol/L (ref 22–32)
Calcium: 8.8 mg/dL — ABNORMAL LOW (ref 8.9–10.3)
Chloride: 100 mmol/L — ABNORMAL LOW (ref 101–111)
Creatinine, Ser: 5.37 mg/dL — ABNORMAL HIGH (ref 0.61–1.24)
GFR calc Af Amer: 12 mL/min — ABNORMAL LOW (ref >60)
GFR calc non Af Amer: 10 mL/min — ABNORMAL LOW (ref >60)
GLUCOSE: 93 mg/dL (ref 65–99)
POTASSIUM: 4.5 mmol/L (ref 3.5–5.1)
Sodium: 142 mmol/L (ref 135–145)
TOTAL PROTEIN: 6.5 g/dL (ref 6.5–8.1)

## 2017-06-13 LAB — CBC
HCT: 26.6 % — ABNORMAL LOW (ref 40.0–52.0)
Hemoglobin: 9.2 g/dL — ABNORMAL LOW (ref 13.0–18.0)
MCH: 31.1 pg (ref 26.0–34.0)
MCHC: 34.6 g/dL (ref 32.0–36.0)
MCV: 90 fL (ref 80.0–100.0)
PLATELETS: 133 10*3/uL — AB (ref 150–440)
RBC: 2.96 MIL/uL — AB (ref 4.40–5.90)
RDW: 12.8 % (ref 11.5–14.5)
WBC: 5.5 10*3/uL (ref 3.8–10.6)

## 2017-06-13 LAB — POTASSIUM: Potassium: 4.7 mmol/L (ref 3.5–5.1)

## 2017-06-13 MED ORDER — BICALUTAMIDE 50 MG PO TABS
50.0000 mg | ORAL_TABLET | Freq: Every day | ORAL | Status: DC
  Administered 2017-06-13 – 2017-06-15 (×3): 50 mg via ORAL
  Filled 2017-06-13 (×4): qty 1

## 2017-06-13 MED ORDER — RENA-VITE PO TABS
1.0000 | ORAL_TABLET | Freq: Every day | ORAL | Status: DC
  Administered 2017-06-13: 21:00:00 1 via ORAL
  Filled 2017-06-13 (×3): qty 1

## 2017-06-13 MED ORDER — ENSURE ENLIVE PO LIQD
237.0000 mL | Freq: Two times a day (BID) | ORAL | Status: DC
  Administered 2017-06-13: 237 mL via ORAL

## 2017-06-13 MED ORDER — HEPARIN SODIUM (PORCINE) 5000 UNIT/ML IJ SOLN: 5000.0000 [IU] | Freq: Three times a day (TID) | INTRAMUSCULAR | Status: DC

## 2017-06-13 MED ORDER — ADULT MULTIVITAMIN W/MINERALS CH
1.0000 | ORAL_TABLET | Freq: Every day | ORAL | Status: DC
  Administered 2017-06-13 – 2017-06-15 (×3): 1 via ORAL
  Filled 2017-06-13 (×4): qty 1

## 2017-06-13 NOTE — Progress Notes (Signed)
HD ended 

## 2017-06-13 NOTE — Progress Notes (Signed)
Pt transferred to stepdown unit for stat placement of temporary dialysis catheter for emergent HD due to worsening renal failure with hyperkalemia.  Pt recently diagnosed with prostate cancer 3 weeks prior to presentation to ER and had right ureteral stent placed.  He is currently alert and oriented, following commands, lungs clear, sinus tach on cardiac monitor, and vss.  While in stepdown unit PCCM will assist with treatment.   [name], [place] Pager [phone] (please enter 7 digits) PCCM Consult Pager [phone] (please enter 7 digits)

## 2017-06-13 NOTE — Progress Notes (Signed)
Initial Nutrition Assessment  DOCUMENTATION CODES:   Severe malnutrition in context of chronic illness  INTERVENTION:   Pt at high refeeding risk; recommend monitor K, Mg, and P labs   Ensure Enlive po BID, each supplement provides 350 kcal and 20 grams of protein  MVI daily  Rena-vite daily   Liberalize diet  Magic cup TID with meals, each supplement provides 290 kcal and 9 grams of protein  Snacks   NUTRITION DIAGNOSIS:   Severe Malnutrition related to cancer and cancer related treatments as evidenced by 15 percent weight loss in 6 months, severe fat depletion, severe muscle depletion.  GOAL:   Patient will meet greater than or equal to 90% of their needs  MONITOR:   PO intake, Supplement acceptance, Labs, Weight trends, I & O's  REASON FOR ASSESSMENT:   Malnutrition Screening Tool    ASSESSMENT:   62 y.o. male with medical problems of hypertension, recently diagnosed prostate cancer, who was admitted to [facility] on 06/12/2017 for evaluation of acute renal failure and hyperkalemia.   Met with pt in room today. Pt reports poor appetite and oral intake for the past 6 months. Per chart, pt has lost 27lbs(15%) in 6 months; this is severe weight loss. Pt reports that he is mainly living off Ensure supplements at home. Pt reports he drinks 2-3 supplements per day. Pt does not eat much real food. Pt does not take any multivitamin. Pt prefers chocolate supplements. Pt drank 1/2 of a Nepro today but reports that he really does not like it. RD discussed with pt the importance of adequate protein intake given his cancer diagnosis. Pt will not likely be able to meet his estimated needs via supplements alone. RD discussed tips for adding in protein and calories to his meals and recommended daily MVI. Since pt is eating <25% of meals, RD will liberalize diet and order Ensure. Pt initiated on HD 1/14; RD will order daily rena-vite until dialysis is discontinued. Pt at high refeeding risk;  recommend monitor K, Mg, and P labs    Medications reviewed and include: Na bicarbonate, heparin  Labs reviewed: K 4.5 wnl, Cl 100(L), BUN 69(H), creat 5.37(H), Ca 8.8(L) adj. 9.6 wnl, alb 3.0(L) Hgb 9.2(L), Hct 26.6(L)  Nutrition-Focused physical exam completed. Findings are severe fat depletions in arms and chest, moderate muscle depletions in temporal regions and BLE, severe muscle depletions in clavicles and shoulders, and mild edema. Pt noted to have bruising on bilateral arms.   Diet Order:  Diet Heart Room service appropriate? Yes; Fluid consistency: Thin Diet NPO time specified Except for: [employer], Sips with Meds  EDUCATION NEEDS:   Education needs have been addressed  Skin:  Skin Assessment: (Incision penis )  Last BM:  1/15- TYPE 6  Height:   Ht Readings from Last 1 Encounters:  06/12/17 5' 11" (1.803 m)    Weight:   Wt Readings from Last 1 Encounters:  06/12/17 154 lb 15.7 oz (70.3 kg)    Ideal Body Weight:  78.2 kg  BMI:  Body mass index is 21.62 kg/m.  Estimated Nutritional Needs:   Kcal:  2000-2300kcal/day   Protein:  105-119g/day   Fluid:  >2L/day   Koleen Distance MS, RD, LDN Pager #[phone] After Hours Pager: [phone]

## 2017-06-13 NOTE — Progress Notes (Signed)
[facility], [place] 06/13/17  Subjective:   Patient had severe hyperkalemia yesterday and underwent emergency hemodialysis to correct the same.  He tolerated dialysis well.  He now has a Foley catheter in his urine output appears to have improved. No complaints of shortness of breath.   Objective:  Vital signs in last 24 hours:  Temp:  [98 F (36.7 C)-98.8 F (37.1 C)] 98.7 F (37.1 C) (01/15 1549) Pulse Rate:  [88-121] 96 (01/15 1549) Resp:  [14-23] 22 (01/15 1549) BP: (109-145)/(56-76) 138/64 (01/15 1549) SpO2:  [94 %-99 %] 97 % (01/15 1549) Weight:  [70.3 kg (154 lb 15.7 oz)-70.3 kg (155 lb)] 70.3 kg (154 lb 15.7 oz) (01/14 2200)  Weight change:  Filed Weights   06/12/17 1733 06/12/17 2200  Weight: 70.3 kg (155 lb) 70.3 kg (154 lb 15.7 oz)    Intake/Output:    Intake/Output Summary (Last 24 hours) at 06/13/2017 1607 Last data filed at 06/13/2017 1500 Gross per 24 hour  Intake 2235.75 ml  Output 577 ml  Net 1658.75 ml     Physical Exam: General:  Sitting up in the bed, no acute distress  HEENT  anicteric  Neck  supple  Pulm/lungs  normal breathing effort, clear to auscultation  CVS/Heart  regular rhythm, no rub or gallop  Abdomen:   Soft, nontender, nondistended  Extremities:  Trace edema  Neurologic:  Alert, oriented  Skin:  No acute rashes  Access:  Right femoral temporary dialysis catheter       Basic Metabolic Panel:  Recent Labs  Lab 06/12/17 1208 06/12/17 1808 06/12/17 2343 06/13/17 0350  NA 139 137  --  142  K 6.3* 7.2* 4.7 4.5  CL 104 106  --  100*  CO2 16* 16*  --  23  GLUCOSE 107* 92  --  93  BUN 108* 110*  --  69*  CREATININE 6.91* 7.23*  --  5.37*  CALCIUM 9.7 9.3  --  8.8*     CBC: Recent Labs  Lab 06/12/17 1208 06/13/17 0350  WBC 6.7 5.5  HGB 11.3* 9.2*  HCT 33.2* 26.6*  MCV 90.9 90.0  PLT 140* 133*     No results found for: HEPBSAG, HEPBSAB, HEPBIGM    Microbiology:  Recent Results  (from the past 240 hour(s))  MRSA PCR Screening     Status: None   Collection Time: 06/12/17  8:39 PM  Result Value Ref Range Status   MRSA by PCR NEGATIVE NEGATIVE Final    Comment:        The GeneXpert MRSA Assay (FDA approved for NASAL specimens only), is one component of a comprehensive MRSA colonization surveillance program. It is not intended to diagnose MRSA infection nor to guide or monitor treatment for MRSA infections. Performed at [facility], [address] [place], [place] [address]     Coagulation Studies: Recent Labs    06/12/17 1208  LABPROT 15.3*  INR 1.22    Urinalysis: No results for input(s): COLORURINE, LABSPEC, PHURINE, GLUCOSEU, HGBUR, BILIRUBINUR, KETONESUR, PROTEINUR, UROBILINOGEN, NITRITE, LEUKOCYTESUR in the last 72 hours.  Invalid input(s): APPERANCEUR    Imaging: US Renal  Result Date: 06/12/2017 CLINICAL DATA:  62 year old male with acute renal failure. Initial encounter. EXAM: RENAL / URINARY TRACT ULTRASOUND COMPLETE COMPARISON:  04/27/2017 CT. FINDINGS: Right Kidney: Length: 11 cm. Mild increased echogenicity renal parenchyma. Moderate right hydronephrosis. Stent in place. Left Kidney: Length: 12.7 cm.  Moderate hydronephrosis. Bladder: Thickening of the posterior bladder spanning  over 6.4 x 2.6.9 cm. Question tumor. IMPRESSION: Moderate bilateral hydronephrosis as noted on prior CT. Right-sided stent has been placed. Thickened posterior bladder wall. Although prostate gland is enlarged and impressing upon the bladder, primary bladder lesion is a consideration. These results will be called to the ordering clinician or representative by the Radiologist Assistant, and communication documented in the PACS or zVision Dashboard. Electronically Signed   By: Genia Del M.D.   On: 06/12/2017 17:25   US Venous Img Lower Bilateral  Result Date: 06/12/2017 CLINICAL DATA:  Bilateral lower extremity pain and edema for the past 6 months.  History of dog bite injury involving the right leg approximately 6-7 months ago. History of malignancy. Evaluate for DVT. EXAM: BILATERAL LOWER EXTREMITY VENOUS DOPPLER ULTRASOUND TECHNIQUE: Gray-scale sonography with graded compression, as well as color Doppler and duplex ultrasound were performed to evaluate the lower extremity deep venous systems from the level of the common femoral vein and including the common femoral, femoral, profunda femoral, popliteal and calf veins including the posterior tibial, peroneal and gastrocnemius veins when visible. The superficial great saphenous vein was also interrogated. Spectral Doppler was utilized to evaluate flow at rest and with distal augmentation maneuvers in the common femoral, femoral and popliteal veins. COMPARISON:  None. FINDINGS: RIGHT LOWER EXTREMITY Common Femoral Vein: No evidence of thrombus. Normal compressibility, respiratory phasicity and response to augmentation. Saphenofemoral Junction: No evidence of thrombus. Normal compressibility and flow on color Doppler imaging. Profunda Femoral Vein: No evidence of thrombus. Normal compressibility and flow on color Doppler imaging. Femoral Vein: No evidence of thrombus. Normal compressibility, respiratory phasicity and response to augmentation. Popliteal Vein: No evidence of thrombus. Normal compressibility, respiratory phasicity and response to augmentation. Calf Veins: No evidence of thrombus. Normal compressibility and flow on color Doppler imaging. Superficial Great Saphenous Vein: No evidence of thrombus. Normal compressibility. Venous Reflux:  None. Other Findings: Note is made of several prominent though non pathologically enlarged right inguinal lymph nodes with dominant inguinal lymph node measuring approximately 0.9 cm in greatest short axis diameter and maintaining a benign fatty hilum. LEFT LOWER EXTREMITY Common Femoral Vein: No evidence of thrombus. Normal compressibility, respiratory phasicity and  response to augmentation. Saphenofemoral Junction: No evidence of thrombus. Normal compressibility and flow on color Doppler imaging. Profunda Femoral Vein: No evidence of thrombus. Normal compressibility and flow on color Doppler imaging. Femoral Vein: No evidence of thrombus. Normal compressibility, respiratory phasicity and response to augmentation. Popliteal Vein: No evidence of thrombus. Normal compressibility, respiratory phasicity and response to augmentation. Calf Veins: No evidence of thrombus. Normal compressibility and flow on color Doppler imaging. Superficial Great Saphenous Vein: Note is made of several prominent though non pathologically enlarged left inguinal lymph nodes with dominant left inguinal lymph node measuring 0.7 cm in greatest short axis diameter. Venous Reflux:  None. Other Findings:  None. IMPRESSION: No evidence of DVT within either lower extremity. Electronically Signed   By: Sandi Mariscal M.D.   On: 06/12/2017 16:42   Dg Chest Port 1 View  Result Date: 06/13/2017 CLINICAL DATA:  62 year old. Failed attempt at left IJ line placement. Initial encounter. EXAM: PORTABLE CHEST 1 VIEW COMPARISON:  11/29/2016 chest CT.  11/11/2013 chest x-ray. FINDINGS: Suspect prominent skin fold rather than pneumothorax. This can be confirmed on follow-up. Fullness of the right hilar region, underlying mass not excluded. CT chest can be obtained for further delineation Biapical parenchymal changes similar to prior examination. 11/29/2016 chest CT recommended six-month follow-up to confirm stability of nodular density  within the right lung apex. No infiltrate or congestive heart failure. Heart size within normal limits. Calcified aorta. Bilateral carotid bifurcation calcifications. No radiopaque foreign body. IMPRESSION: Suspect prominent skin fold rather than pneumothorax. This can be confirmed on follow-up. Fullness of the right hilar region, underlying mass not excluded. CT chest can be obtained for  further delineation Biapical parenchymal changes similar to prior examination. 11/29/2016 chest CT recommended six-month follow-up to confirm stability of nodular density within the right lung apex. Electronically Signed   By: Genia Del M.D.   On: 06/13/2017 06:43     Medications:   .  sodium bicarbonate (isotonic) infusion in sterile water 75 mL/hr at 06/13/17 0736   . amLODipine  5 mg Oral Daily  . bicalutamide  50 mg Oral Daily  . feeding supplement (ENSURE ENLIVE)  237 mL Oral BID BM  . [START ON 06/15/2017] heparin  5,000 Units Subcutaneous Q8H  . multivitamin  1 tablet Oral QHS  . multivitamin with minerals  1 tablet Oral Daily  . tamsulosin  0.4 mg Oral Daily   acetaminophen **OR** acetaminophen, ondansetron **OR** ondansetron (ZOFRAN) IV  Assessment/ Plan:  62 y.o. Caucasian male with hypertension, coronary atherosclerosis, emphysema, current smoker (1ppd) recently diagnosed metastatic prostate cancer, lymphadenopathy of the abdomen, bilateral hydronephrosis with right ureteral stent placement, was admitted on 06/12/2017 .  Patient had presented as outpatient for percutaneous left nephroureteral stent placement by vascular radiology but was found to have hyperkalemia and acute renal failure therefore he is admitted for further evaluation and management.  1.  Acute renal failure  2.  Chronic kidney disease st 3 with baseline creatinine 1.55/ GFR 48 from June 2018 3.  Metastatic prostate cancer with pelvic and abdominal adnopathy 4.  Bilateral hydronephrosis with right renal stent placement in December 2018 5.  Severe hyperkalemia  Patient underwent emergency dialysis last night for severe hyperkalemia.  His potassium level is now normal.  He is scheduled for percutaneous bilateral nephrostomy tubes placement tomorrow.  If potassium level remains normal and serum creatinine continues to improve, we will discontinue the dialysis catheter tomorrow Volume status and electrolytes are  acceptable No acute indication for dialysis at present   LOS: Brooke 1/15/20194:07 PM  [place], [place]

## 2017-06-13 NOTE — Plan of Care (Signed)
  Progressing Education: Knowledge of General Education information will improve 06/13/2017 1428 - Progressing by Darrelyn Hillock, RN Health Behavior/Discharge Planning: Ability to manage health-related needs will improve 06/13/2017 1428 - Progressing by Darrelyn Hillock, RN Nutrition: Adequate nutrition will be maintained 06/13/2017 1428 - Progressing by Darrelyn Hillock, RN Coping: Level of anxiety will decrease 06/13/2017 1428 - Progressing by Darrelyn Hillock, RN Pain Managment: General experience of comfort will improve 06/13/2017 1428 - Progressing by Darrelyn Hillock, RN Safety: Ability to remain free from injury will improve 06/13/2017 1428 - Progressing by Darrelyn Hillock, RN Education: Knowledge of disease or condition will improve 06/13/2017 1428 - Progressing by Darrelyn Hillock, RN Understanding of medication regimen will improve 06/13/2017 1428 - Progressing by Darrelyn Hillock, RN Activity: Ability to tolerate increased activity will improve 06/13/2017 1428 - Progressing by Darrelyn Hillock, RN Cardiac: Ability to achieve and maintain adequate cardiopulmonary perfusion will improve 06/13/2017 1428 - Progressing by Darrelyn Hillock, [place] Behavior/Discharge Planning: Ability to safely manage health-related needs after discharge will improve 06/13/2017 1428 - Progressing by Darrelyn Hillock, RN

## 2017-06-13 NOTE — Progress Notes (Signed)
Post HD  

## 2017-06-13 NOTE — Progress Notes (Signed)
[place] [facility]                                                                                                                                                                                  Patient Demographics   Michael Small, is a 62 y.o. male, DOB - [birth year], BUL:[id number]  Admit date - 06/12/2017   Admitting Physician [name], MD  Outpatient Primary MD for the patient is [name], [name], MD   LOS - 1  Subjective: Patient admitted with severe hyperkalemia and acute renal failure He states that he is feeling fine has no complaints    Review of Systems:   CONSTITUTIONAL: No documented fever. No fatigue, weakness. No weight gain, no weight loss.  EYES: No blurry or double vision.  ENT: No tinnitus. No postnasal drip. No redness of the oropharynx.  RESPIRATORY: No cough, no wheeze, no hemoptysis. No dyspnea.  CARDIOVASCULAR: No chest pain. No orthopnea. No palpitations. No syncope.  GASTROINTESTINAL: No nausea, no vomiting or diarrhea. No abdominal pain. No melena or hematochezia.  GENITOURINARY: No dysuria or hematuria.  ENDOCRINE: No polyuria or nocturia. No heat or cold intolerance.  HEMATOLOGY: No anemia. No bruising. No bleeding.  INTEGUMENTARY: No rashes. No lesions.  MUSCULOSKELETAL: No arthritis. No swelling. No gout.  NEUROLOGIC: No numbness, tingling, or ataxia. No seizure-type activity.  PSYCHIATRIC: No anxiety. No insomnia. No ADD.    Vitals:   Vitals:   06/13/17 0300 06/13/17 0500 06/13/17 1122 06/13/17 1549  BP: 121/62 130/66 (!) 129/56 138/64  Pulse: (!) 121 97 98 96  Resp: (!) 23 17 18  (!) 22  Temp:   98.8 F (37.1 C) 98.7 F (37.1 C)  TempSrc:   Oral Oral  SpO2: 96% 97% 96% 97%  Weight:      Height:        Wt Readings from Last 3 Encounters:  06/12/17 154 lb 15.7 oz (70.3 kg)  06/12/17 159 lb (72.1 kg)  06/01/17 159 lb 12.8 oz (72.5 kg)     Intake/Output Summary (Last 24 hours) at 06/13/2017  1612 Last data filed at 06/13/2017 1500 Gross per 24 hour  Intake 2235.75 ml  Output 577 ml  Net 1658.75 ml    Physical Exam:   GENERAL: Pleasant-appearing in no apparent distress.  HEAD, EYES, EARS, NOSE AND THROAT: Atraumatic, normocephalic. Extraocular muscles are intact. Pupils equal and reactive to light. Sclerae anicteric. No conjunctival injection. No oro-pharyngeal erythema.  NECK: Supple. There is no jugular venous distention. No bruits, no lymphadenopathy, no thyromegaly.  HEART: Regular rate and rhythm,. No murmurs, no rubs, no clicks.  LUNGS: Clear to auscultation bilaterally. No rales or  rhonchi. No wheezes.  ABDOMEN: Soft, flat, nontender, nondistended. Has good bowel sounds. No hepatosplenomegaly appreciated.  EXTREMITIES: No evidence of any cyanosis, clubbing, or peripheral edema.  +2 pedal and radial pulses bilaterally.  NEUROLOGIC: The patient is alert, awake, and oriented x3 with no focal motor or sensory deficits appreciated bilaterally.  SKIN: Moist and warm with no rashes appreciated.  Psych: Not anxious, depressed LN: No inguinal LN enlargement    Antibiotics   Anti-infectives (From admission, onward)   None      Medications   Scheduled Meds: . amLODipine  5 mg Oral Daily  . bicalutamide  50 mg Oral Daily  . feeding supplement (ENSURE ENLIVE)  237 mL Oral BID BM  . [START ON 06/15/2017] heparin  5,000 Units Subcutaneous Q8H  . multivitamin  1 tablet Oral QHS  . multivitamin with minerals  1 tablet Oral Daily  . tamsulosin  0.4 mg Oral Daily   Continuous Infusions: .  sodium bicarbonate (isotonic) infusion in sterile water 75 mL/hr at 06/13/17 0736   PRN Meds:.acetaminophen **OR** acetaminophen, ondansetron **OR** ondansetron (ZOFRAN) IV   Data Review:   Micro Results Recent Results (from the past 240 hour(s))  MRSA PCR Screening     Status: None   Collection Time: 06/12/17  8:39 PM  Result Value Ref Range Status   MRSA by PCR NEGATIVE  NEGATIVE Final    Comment:        The GeneXpert MRSA Assay (FDA approved for NASAL specimens only), is one component of a comprehensive MRSA colonization surveillance program. It is not intended to diagnose MRSA infection nor to guide or monitor treatment for MRSA infections. Performed at [facility], [address], [place], [place] [address]     Radiology Reports [place] Transrectal Complete  Result Date: 05/24/2017 Please see Notes or Procedures tab for imaging impression.  US Renal  Result Date: 06/12/2017 CLINICAL DATA:  62 year old male with acute renal failure. Initial encounter. EXAM: RENAL / URINARY TRACT ULTRASOUND COMPLETE COMPARISON:  04/27/2017 CT. FINDINGS: Right Kidney: Length: 11 cm. Mild increased echogenicity renal parenchyma. Moderate right hydronephrosis. Stent in place. Left Kidney: Length: 12.7 cm.  Moderate hydronephrosis. Bladder: Thickening of the posterior bladder spanning over 6.4 x 2.6.9 cm. Question tumor. IMPRESSION: Moderate bilateral hydronephrosis as noted on prior CT. Right-sided stent has been placed. Thickened posterior bladder wall. Although prostate gland is enlarged and impressing upon the bladder, primary bladder lesion is a consideration. These results will be called to the ordering clinician or representative by the Radiologist Assistant, and communication documented in the PACS or zVision Dashboard. Electronically Signed   By: Genia Del M.D.   On: 06/12/2017 17:25   US Venous Img Lower Bilateral  Result Date: 06/12/2017 CLINICAL DATA:  Bilateral lower extremity pain and edema for the past 6 months. History of dog bite injury involving the right leg approximately 6-7 months ago. History of malignancy. Evaluate for DVT. EXAM: BILATERAL LOWER EXTREMITY VENOUS DOPPLER ULTRASOUND TECHNIQUE: Gray-scale sonography with graded compression, as well as color Doppler and duplex ultrasound were performed to evaluate the lower extremity deep venous  systems from the level of the common femoral vein and including the common femoral, femoral, profunda femoral, popliteal and calf veins including the posterior tibial, peroneal and gastrocnemius veins when visible. The superficial great saphenous vein was also interrogated. Spectral Doppler was utilized to evaluate flow at rest and with distal augmentation maneuvers in the common femoral, femoral and popliteal veins. COMPARISON:  None. FINDINGS: RIGHT LOWER  EXTREMITY Common Femoral Vein: No evidence of thrombus. Normal compressibility, respiratory phasicity and response to augmentation. Saphenofemoral Junction: No evidence of thrombus. Normal compressibility and flow on color Doppler imaging. Profunda Femoral Vein: No evidence of thrombus. Normal compressibility and flow on color Doppler imaging. Femoral Vein: No evidence of thrombus. Normal compressibility, respiratory phasicity and response to augmentation. Popliteal Vein: No evidence of thrombus. Normal compressibility, respiratory phasicity and response to augmentation. Calf Veins: No evidence of thrombus. Normal compressibility and flow on color Doppler imaging. Superficial Great Saphenous Vein: No evidence of thrombus. Normal compressibility. Venous Reflux:  None. Other Findings: Note is made of several prominent though non pathologically enlarged right inguinal lymph nodes with dominant inguinal lymph node measuring approximately 0.9 cm in greatest short axis diameter and maintaining a benign fatty hilum. LEFT LOWER EXTREMITY Common Femoral Vein: No evidence of thrombus. Normal compressibility, respiratory phasicity and response to augmentation. Saphenofemoral Junction: No evidence of thrombus. Normal compressibility and flow on color Doppler imaging. Profunda Femoral Vein: No evidence of thrombus. Normal compressibility and flow on color Doppler imaging. Femoral Vein: No evidence of thrombus. Normal compressibility, respiratory phasicity and response to  augmentation. Popliteal Vein: No evidence of thrombus. Normal compressibility, respiratory phasicity and response to augmentation. Calf Veins: No evidence of thrombus. Normal compressibility and flow on color Doppler imaging. Superficial Great Saphenous Vein: Note is made of several prominent though non pathologically enlarged left inguinal lymph nodes with dominant left inguinal lymph node measuring 0.7 cm in greatest short axis diameter. Venous Reflux:  None. Other Findings:  None. IMPRESSION: No evidence of DVT within either lower extremity. Electronically Signed   By: Sandi Mariscal M.D.   On: 06/12/2017 16:42   Dg Chest Port 1 View  Result Date: 06/13/2017 CLINICAL DATA:  62 year old. Failed attempt at left IJ line placement. Initial encounter. EXAM: PORTABLE CHEST 1 VIEW COMPARISON:  11/29/2016 chest CT.  11/11/2013 chest x-ray. FINDINGS: Suspect prominent skin fold rather than pneumothorax. This can be confirmed on follow-up. Fullness of the right hilar region, underlying mass not excluded. CT chest can be obtained for further delineation Biapical parenchymal changes similar to prior examination. 11/29/2016 chest CT recommended six-month follow-up to confirm stability of nodular density within the right lung apex. No infiltrate or congestive heart failure. Heart size within normal limits. Calcified aorta. Bilateral carotid bifurcation calcifications. No radiopaque foreign body. IMPRESSION: Suspect prominent skin fold rather than pneumothorax. This can be confirmed on follow-up. Fullness of the right hilar region, underlying mass not excluded. CT chest can be obtained for further delineation Biapical parenchymal changes similar to prior examination. 11/29/2016 chest CT recommended six-month follow-up to confirm stability of nodular density within the right lung apex. Electronically Signed   By: Genia Del M.D.   On: 06/13/2017 06:43   [place] Prostate Biopsy Multiple  Result Date: 05/24/2017 Please see  Notes or Procedures tab for imaging impression.    CBC Recent Labs  Lab 06/12/17 1208 06/13/17 0350  WBC 6.7 5.5  HGB 11.3* 9.2*  HCT 33.2* 26.6*  PLT 140* 133*  MCV 90.9 90.0  MCH 30.9 31.1  MCHC 34.0 34.6  RDW 13.0 12.8    Chemistries  Recent Labs  Lab 06/12/17 1208 06/12/17 1808 06/12/17 2343 06/13/17 0350  NA 139 137  --  142  K 6.3* 7.2* 4.7 4.5  CL 104 106  --  100*  CO2 16* 16*  --  23  GLUCOSE 107* 92  --  93  BUN 108* 110*  --  69*  CREATININE 6.91* 7.23*  --  5.37*  CALCIUM 9.7 9.3  --  8.8*  AST  --   --   --  16  ALT  --   --   --  10*  ALKPHOS  --   --   --  83  BILITOT  --   --   --  1.1   ------------------------------------------------------------------------------------------------------------------ estimated creatinine clearance is 14.2 mL/min (A) (by C-G formula based on SCr of 5.37 mg/dL (H)). ------------------------------------------------------------------------------------------------------------------ No results for input(s): HGBA1C in the last 72 hours. ------------------------------------------------------------------------------------------------------------------ No results for input(s): CHOL, HDL, LDLCALC, TRIG, CHOLHDL, LDLDIRECT in the last 72 hours. ------------------------------------------------------------------------------------------------------------------ Recent Labs    06/12/17 1808  TSH 2.144   ------------------------------------------------------------------------------------------------------------------ No results for input(s): VITAMINB12, FOLATE, FERRITIN, TIBC, IRON, RETICCTPCT in the last 72 hours.  Coagulation profile Recent Labs  Lab 06/12/17 1208  INR 1.22    No results for input(s): DDIMER in the last 72 hours.  Cardiac Enzymes No results for input(s): CKMB, TROPONINI, MYOGLOBIN in the last 168 hours.  Invalid input(s):  CK ------------------------------------------------------------------------------------------------------------------ Invalid input(s): [place]   Philopater Mucha  is a 62 y.o. male with a known history of hypertension, CAD status post stent about 5 years ago, recent diagnosis of prostate cancer 3 weeks ago presents to hospital from radiology suite secondary to abnormal labs.  1. Hyperkalemia-secondary to acute renal failure. -Now resolved  2. Acute renal failure on CKD- Likely due to bilateral hydronephrosis I have discussed the case with Dr. Bernardo Heater who recommends bilateral nephrostomy tubes I have also notified interventional radiology for the need for this procedure tomorrow   3. Hypertension-continue Norvasc  4. Prostate cancer-follow-up with oncology as schedule later this week. Also follows with urology as outpatient. Status post hormonal injection done  5. CAD status post stent-no active cardiac symptoms. Hold aspirin as he needs a procedure.  6. DVT prophylaxis heparin subcutaneous heparin       Code Status Orders  (From admission, onward)        Start     Ordered   06/12/17 1756  Full code  Continuous     06/12/17 1757    Code Status History    Date Active Date Inactive Code Status Order ID Comments User Context   06/12/2017 16:54 06/12/2017 16:56 Full Code [id number]  [name], MD [facility], urology  DVT Prophylaxis SCDs  Lab Results  Component Value Date   PLT 133 (L) 06/13/2017     Time Spent in minutes 35 minutes greater than 50% of time spent in care coordination and counseling patient regarding the condition and plan of care.   Dustin Flock M.D on 06/13/2017 at 4:12 PM  Between 7am to 6pm - Pager - [phone]  After 6pm go to [email or web address] - password [email or web address] [facility]   Office  [phone]

## 2017-06-13 NOTE — Progress Notes (Signed)
Dr Posey Pronto was informed of pt having 7 beats of v-tach , no new order at this time , will continue to monitor .

## 2017-06-13 NOTE — Consult Note (Signed)
Urology Consult  I have been asked to see the patient by Dr. Posey Pronto, for evaluation and management of bilateral hydronephrosis and acute renal failure.  Chief Complaint: N/A  History of Present Illness: Michael Small is a 62 y.o. 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  He was recently noted to have a CT scan showing bilateral hydronephrosis and had a PSA of greater than 2000.  He underwent prostate biopsy on 12/26 with all core showing Gleason 4+5 adenocarcinoma the prostate and prostate cancer invading the colonic mucosa.  A right ureteral stent was able to be placed however the left ureteral orifice could not be identified at the time of this procedure.  He received a Lupron injection on 06/01/2016 and is scheduled for bone scan.    He was scheduled for placement of a left percutaneous nephrostomy/antegrade stent yesterday however his creatinine was noted to be 6.91 (up from 2.9) and potassium 6.3.  He underwent placement of a temporary hemodialysis catheter and has undergone dialysis.  His only complaints have been poor appetite and weight loss for the last few months.  Denies fever, chills.    Past Medical History:  Diagnosis Date  . Depression   . Hyperlipidemia   . Hypertension   . Myocardial infarction [facility])    s/p stent  . Prostate cancer [facility])     Past Surgical History:  Procedure Laterality Date  . CORONARY ANGIOPLASTY WITH STENT PLACEMENT  12/2011   [facility]  . CYSTOSCOPY W/ RETROGRADES Bilateral 05/24/2017   Procedure: CYSTOSCOPY WITH RETROGRADE PYELOGRAM;  Surgeon: [name], MD;  Location: [facility] ORS;  Service: Urology;  Laterality: Bilateral;  . CYSTOSCOPY WITH STENT PLACEMENT Right 05/24/2017   Procedure: CYSTOSCOPY WITH STENT PLACEMENT;  Surgeon: [name], MD;  Location: [facility] ORS;  Service: Urology;  Laterality: Right;  . PROSTATE BIOPSY N/A 05/24/2017   Procedure: PROSTATE BIOPSY;  Surgeon:  [name], MD;  Location: [facility] ORS;  Service: Urology;  Laterality: N/A;  . US ECHOCARDIOGRAPHY  12/2011   Mild LV dysfunction, EF=45%    Home Medications:  No outpatient medications have been marked as taking for the 06/12/17 encounter [facility] Encounter) with [facility] 3.    Allergies: No Known Allergies  Family History  Problem Relation Age of Onset  . Cancer Mother   . Hypertension Mother   . Diabetes Father   . Heart disease Father   . Cancer Paternal Grandfather   . Prostate cancer Neg Hx   . Bladder Cancer Neg Hx   . Kidney cancer Neg Hx     Social History:  reports that he has been smoking cigars.  He has a 96.00 pack-year smoking history. he has never used smokeless tobacco. He reports that he does not drink alcohol or use drugs.  ROS: A complete review of systems was performed.  All systems are negative except for pertinent findings as noted.  Physical Exam:  Vital signs in last 24 hours: Temp:  [98 F (36.7 C)-98.8 F (37.1 C)] 98.8 F (37.1 C) (01/15 1122) Pulse Rate:  [88-121] 98 (01/15 1122) Resp:  [14-23] 18 (01/15 1122) BP: (109-145)/(56-76) 129/56 (01/15 1122) SpO2:  [94 %-99 %] 96 % (01/15 1122) Weight:  [154 lb 15.7 oz (70.3 kg)-155 lb (70.3 kg)] 154 lb 15.7 oz (70.3 kg) (01/14 2200) Constitutional:  Alert and oriented, No acute distress HEENT: [place] AT, moist mucus membranes.  Trachea midline, no masses Cardiovascular:  Regular rate and rhythm, no clubbing, cyanosis, or edema. Respiratory: Normal respiratory effort, lungs clear bilaterally GI: Abdomen is soft, nontender, nondistended, no abdominal masses GU: No CVA tenderness Skin: No rashes, bruises or suspicious lesions Lymph: No cervical or inguinal adenopathy Neurologic: Grossly intact, no focal deficits, moving all 4 extremities Psychiatric: Normal mood and affect   Laboratory Data:  Recent Labs    06/12/17 1208 06/13/17 0350  WBC 6.7 5.5  HGB 11.3* 9.2*  HCT 33.2* 26.6*   Recent  Labs    06/12/17 1208 06/12/17 1808 06/12/17 2343 06/13/17 0350  NA 139 137  --  142  K 6.3* 7.2* 4.7 4.5  CL 104 106  --  100*  CO2 16* 16*  --  23  GLUCOSE 107* 92  --  93  BUN 108* 110*  --  69*  CREATININE 6.91* 7.23*  --  5.37*  CALCIUM 9.7 9.3  --  8.8*   Recent Labs    06/12/17 1208  INR 1.22   No results for input(s): LABURIN in the last 72 hours. Results for orders placed or performed during the hospital encounter of 06/12/17  MRSA PCR Screening     Status: None   Collection Time: 06/12/17  8:39 PM  Result Value Ref Range Status   MRSA by PCR NEGATIVE NEGATIVE Final    Comment:        The GeneXpert MRSA Assay (FDA approved for NASAL specimens only), is one component of a comprehensive MRSA colonization surveillance program. It is not intended to diagnose MRSA infection nor to guide or monitor treatment for MRSA infections. Performed at [facility], [address], [place], [place] [address]      Radiologic Imaging: US Renal  Result Date: 06/12/2017 CLINICAL DATA:  62 year old male with acute renal failure. Initial encounter. EXAM: RENAL / URINARY TRACT ULTRASOUND COMPLETE COMPARISON:  04/27/2017 CT. FINDINGS: Right Kidney: Length: 11 cm. Mild increased echogenicity renal parenchyma. Moderate right hydronephrosis. Stent in place. Left Kidney: Length: 12.7 cm.  Moderate hydronephrosis. Bladder: Thickening of the posterior bladder spanning over 6.4 x 2.6.9 cm. Question tumor. IMPRESSION: Moderate bilateral hydronephrosis as noted on prior CT. Right-sided stent has been placed. Thickened posterior bladder wall. Although prostate gland is enlarged and impressing upon the bladder, primary bladder lesion is a consideration. These results will be called to the ordering clinician or representative by the Radiologist Assistant, and communication documented in the PACS or zVision Dashboard. Electronically Signed   By: Genia Del M.D.   On: 06/12/2017 17:25    US Venous Img Lower Bilateral  Result Date: 06/12/2017 CLINICAL DATA:  Bilateral lower extremity pain and edema for the past 6 months. History of dog bite injury involving the right leg approximately 6-7 months ago. History of malignancy. Evaluate for DVT. EXAM: BILATERAL LOWER EXTREMITY VENOUS DOPPLER ULTRASOUND TECHNIQUE: Gray-scale sonography with graded compression, as well as color Doppler and duplex ultrasound were performed to evaluate the lower extremity deep venous systems from the level of the common femoral vein and including the common femoral, femoral, profunda femoral, popliteal and calf veins including the posterior tibial, peroneal and gastrocnemius veins when visible. The superficial great saphenous vein was also interrogated. Spectral Doppler was utilized to evaluate flow at rest and with distal augmentation maneuvers in the common femoral, femoral and popliteal veins. COMPARISON:  None. FINDINGS: RIGHT LOWER EXTREMITY Common Femoral Vein: No evidence of thrombus. Normal compressibility, respiratory phasicity and response to augmentation. Saphenofemoral Junction: No evidence of thrombus. Normal  compressibility and flow on color Doppler imaging. Profunda Femoral Vein: No evidence of thrombus. Normal compressibility and flow on color Doppler imaging. Femoral Vein: No evidence of thrombus. Normal compressibility, respiratory phasicity and response to augmentation. Popliteal Vein: No evidence of thrombus. Normal compressibility, respiratory phasicity and response to augmentation. Calf Veins: No evidence of thrombus. Normal compressibility and flow on color Doppler imaging. Superficial Great Saphenous Vein: No evidence of thrombus. Normal compressibility. Venous Reflux:  None. Other Findings: Note is made of several prominent though non pathologically enlarged right inguinal lymph nodes with dominant inguinal lymph node measuring approximately 0.9 cm in greatest short axis diameter and maintaining  a benign fatty hilum. LEFT LOWER EXTREMITY Common Femoral Vein: No evidence of thrombus. Normal compressibility, respiratory phasicity and response to augmentation. Saphenofemoral Junction: No evidence of thrombus. Normal compressibility and flow on color Doppler imaging. Profunda Femoral Vein: No evidence of thrombus. Normal compressibility and flow on color Doppler imaging. Femoral Vein: No evidence of thrombus. Normal compressibility, respiratory phasicity and response to augmentation. Popliteal Vein: No evidence of thrombus. Normal compressibility, respiratory phasicity and response to augmentation. Calf Veins: No evidence of thrombus. Normal compressibility and flow on color Doppler imaging. Superficial Great Saphenous Vein: Note is made of several prominent though non pathologically enlarged left inguinal lymph nodes with dominant left inguinal lymph node measuring 0.7 cm in greatest short axis diameter. Venous Reflux:  None. Other Findings:  None. IMPRESSION: No evidence of DVT within either lower extremity. Electronically Signed   By: Sandi Mariscal M.D.   On: 06/12/2017 16:42   Dg Chest Port 1 View  Result Date: 06/13/2017 CLINICAL DATA:  62 year old. Failed attempt at left IJ line placement. Initial encounter. EXAM: PORTABLE CHEST 1 VIEW COMPARISON:  11/29/2016 chest CT.  11/11/2013 chest x-ray. FINDINGS: Suspect prominent skin fold rather than pneumothorax. This can be confirmed on follow-up. Fullness of the right hilar region, underlying mass not excluded. CT chest can be obtained for further delineation Biapical parenchymal changes similar to prior examination. 11/29/2016 chest CT recommended six-month follow-up to confirm stability of nodular density within the right lung apex. No infiltrate or congestive heart failure. Heart size within normal limits. Calcified aorta. Bilateral carotid bifurcation calcifications. No radiopaque foreign body. IMPRESSION: Suspect prominent skin fold rather than  pneumothorax. This can be confirmed on follow-up. Fullness of the right hilar region, underlying mass not excluded. CT chest can be obtained for further delineation Biapical parenchymal changes similar to prior examination. 11/29/2016 chest CT recommended six-month follow-up to confirm stability of nodular density within the right lung apex. Electronically Signed   By: Genia Del M.D.   On: 06/13/2017 06:43    Impression/Assessment:  62 year old male with locally invasive/metastatic prostate cancer and bilateral hydronephrosis with acute renal failure.  Plan:  1.  Recommend placement of bilateral percutaneous nephrostomy tubes by interventional radiology  2.  Start Casodex 50 mg daily  06/13/2017, 1:18 PM  John Giovanni,  MD   Thank you for involving me in this patient's care, I will continue to follow along. Please page with any further questions or concerns. [place]

## 2017-06-14 ENCOUNTER — Inpatient Hospital Stay: Payer: Medicare Other

## 2017-06-14 ENCOUNTER — Other Ambulatory Visit: Payer: Medicare Other

## 2017-06-14 HISTORY — PX: IR NEPHROSTOMY PLACEMENT RIGHT: IMG6064

## 2017-06-14 LAB — HEPATITIS B SURFACE ANTIBODY,QUALITATIVE: HEP B S AB: NONREACTIVE

## 2017-06-14 LAB — BASIC METABOLIC PANEL
ANION GAP: 17 — AB (ref 5–15)
BUN: 74 mg/dL — ABNORMAL HIGH (ref 6–20)
CHLORIDE: 95 mmol/L — AB (ref 101–111)
CO2: 29 mmol/L (ref 22–32)
Calcium: 8.6 mg/dL — ABNORMAL LOW (ref 8.9–10.3)
Creatinine, Ser: 5.82 mg/dL — ABNORMAL HIGH (ref 0.61–1.24)
GFR calc Af Amer: 11 mL/min — ABNORMAL LOW (ref >60)
GFR, EST NON AFRICAN AMERICAN: 9 mL/min — AB (ref >60)
Glucose, Bld: 98 mg/dL (ref 65–99)
POTASSIUM: 4 mmol/L (ref 3.5–5.1)
SODIUM: 141 mmol/L (ref 135–145)

## 2017-06-14 LAB — HEPATITIS B SURFACE ANTIGEN: HEP B S AG: NEGATIVE

## 2017-06-14 LAB — HIV ANTIBODY (ROUTINE TESTING W REFLEX): HIV Screen 4th Generation wRfx: NONREACTIVE

## 2017-06-14 LAB — HEPATITIS B CORE ANTIBODY, TOTAL: Hep B Core Total Ab: NEGATIVE

## 2017-06-14 LAB — PSA: Prostatic Specific Antigen: >3000 ng/mL — ABNORMAL HIGH (ref 0.00–4.00)

## 2017-06-14 MED ORDER — HEPARIN SODIUM (PORCINE) 5000 UNIT/ML IJ SOLN: 5000.0000 [IU] | Freq: Three times a day (TID) | INTRAMUSCULAR | Status: DC

## 2017-06-14 MED ORDER — CEFAZOLIN SODIUM-DEXTROSE 1-4 GM/50ML-% IV SOLN
INTRAVENOUS | Status: AC | PRN
  Administered 2017-06-14: 1 g via INTRAVENOUS

## 2017-06-14 MED ORDER — IOPAMIDOL (ISOVUE-300) INJECTION 61%
50.0000 mL | Freq: Once | INTRAVENOUS | Status: AC | PRN
  Administered 2017-06-14: 10 mL via INTRAVENOUS

## 2017-06-14 MED ORDER — MIDAZOLAM HCL 5 MG/5ML IJ SOLN
INTRAMUSCULAR | Status: AC | PRN
  Administered 2017-06-14 (×3): 1 mg via INTRAVENOUS

## 2017-06-14 MED ORDER — FENTANYL CITRATE (PF) 100 MCG/2ML IJ SOLN
INTRAMUSCULAR | Status: AC | PRN
  Administered 2017-06-14: 50 ug via INTRAVENOUS
  Administered 2017-06-14: 25 ug via INTRAVENOUS

## 2017-06-14 MED ORDER — MIDAZOLAM HCL 5 MG/5ML IJ SOLN
INTRAMUSCULAR | Status: AC
  Filled 2017-06-14: qty 5

## 2017-06-14 MED ORDER — LIDOCAINE HCL (PF) 1 % IJ SOLN
INTRAMUSCULAR | Status: AC
  Filled 2017-06-14: qty 30

## 2017-06-14 MED ORDER — SODIUM CHLORIDE 0.9 % IV SOLN
INTRAVENOUS | Status: DC
  Administered 2017-06-14: 16:00:00 via INTRAVENOUS

## 2017-06-14 MED ORDER — FENTANYL CITRATE (PF) 100 MCG/2ML IJ SOLN
INTRAMUSCULAR | Status: AC
  Filled 2017-06-14: qty 4

## 2017-06-14 NOTE — Progress Notes (Signed)
[facility], [place] 06/14/17  Subjective:   Patient's potassium level is normal His urine output has significantly improved.  2630 cc reported last 24 hours Serum creatinine is about the same as yesterday Denies any shortness of breath Appetite remains poor.  His wife reports that he eats very little He has developed some lower extremity edema.  Objective:  Vital signs in last 24 hours:  Temp:  [98.4 F (36.9 C)-98.7 F (37.1 C)] 98.5 F (36.9 C) (01/16 0738) Pulse Rate:  [89-96] 92 (01/16 0738) Resp:  [16-22] 18 (01/16 0738) BP: (120-140)/(62-71) 127/65 (01/16 0738) SpO2:  [96 %-97 %] 97 % (01/16 0738)  Weight change:  Filed Weights   06/12/17 1733 06/12/17 2200  Weight: 70.3 kg (155 lb) 70.3 kg (154 lb 15.7 oz)    Intake/Output:    Intake/Output Summary (Last 24 hours) at 06/14/2017 1151 Last data filed at 06/14/2017 3825 Gross per 24 hour  Intake 2417.5 ml  Output 2355 ml  Net 62.5 ml     Physical Exam: General:  Sitting up in the bed, no acute distress  HEENT  anicteric  Neck  supple  Pulm/lungs  normal breathing effort, clear to auscultation  CVS/Heart  regular rhythm, no rub or gallop  Abdomen:   Soft, nontender, nondistended  Extremities:  2+ edema  Neurologic:  Alert, oriented  Skin:  No acute rashes  Access:  Right femoral temporary dialysis catheter       Basic Metabolic Panel:  Recent Labs  Lab 06/12/17 1208 06/12/17 1808 06/12/17 2343 06/13/17 0350 06/14/17 0534  NA 139 137  --  142 141  K 6.3* 7.2* 4.7 4.5 4.0  CL 104 106  --  100* 95*  CO2 16* 16*  --  23 29  GLUCOSE 107* 92  --  93 98  BUN 108* 110*  --  69* 74*  CREATININE 6.91* 7.23*  --  5.37* 5.82*  CALCIUM 9.7 9.3  --  8.8* 8.6*     CBC: Recent Labs  Lab 06/12/17 1208 06/13/17 0350  WBC 6.7 5.5  HGB 11.3* 9.2*  HCT 33.2* 26.6*  MCV 90.9 90.0  PLT 140* 133*      Lab Results  Component Value Date   HEPBSAG Negative 06/12/2017   HEPBSAB Non Reactive 06/12/2017      Microbiology:  Recent Results (from the past 240 hour(s))  MRSA PCR Screening     Status: None   Collection Time: 06/12/17  8:39 PM  Result Value Ref Range Status   MRSA by PCR NEGATIVE NEGATIVE Final    Comment:        The GeneXpert MRSA Assay (FDA approved for NASAL specimens only), is one component of a comprehensive MRSA colonization surveillance program. It is not intended to diagnose MRSA infection nor to guide or monitor treatment for MRSA infections. Performed at [facility], [address] [place], [place] [address]     Coagulation Studies: Recent Labs    06/12/17 1208  LABPROT 15.3*  INR 1.22    Urinalysis: No results for input(s): COLORURINE, LABSPEC, PHURINE, GLUCOSEU, HGBUR, BILIRUBINUR, KETONESUR, PROTEINUR, UROBILINOGEN, NITRITE, LEUKOCYTESUR in the last 72 hours.  Invalid input(s): APPERANCEUR    Imaging: US Renal  Result Date: 06/12/2017 CLINICAL DATA:  62 year old male with acute renal failure. Initial encounter. EXAM: RENAL / URINARY TRACT ULTRASOUND COMPLETE COMPARISON:  04/27/2017 CT. FINDINGS: Right Kidney: Length: 11 cm. Mild increased echogenicity renal parenchyma. Moderate right hydronephrosis. Stent in place. Left Kidney:  Length: 12.7 cm.  Moderate hydronephrosis. Bladder: Thickening of the posterior bladder spanning over 6.4 x 2.6.9 cm. Question tumor. IMPRESSION: Moderate bilateral hydronephrosis as noted on prior CT. Right-sided stent has been placed. Thickened posterior bladder wall. Although prostate gland is enlarged and impressing upon the bladder, primary bladder lesion is a consideration. These results will be called to the ordering clinician or representative by the Radiologist Assistant, and communication documented in the PACS or zVision Dashboard. Electronically Signed   By: Genia Del M.D.   On: 06/12/2017 17:25   US Venous Img Lower Bilateral  Result Date: 06/12/2017 CLINICAL  DATA:  Bilateral lower extremity pain and edema for the past 6 months. History of dog bite injury involving the right leg approximately 6-7 months ago. History of malignancy. Evaluate for DVT. EXAM: BILATERAL LOWER EXTREMITY VENOUS DOPPLER ULTRASOUND TECHNIQUE: Gray-scale sonography with graded compression, as well as color Doppler and duplex ultrasound were performed to evaluate the lower extremity deep venous systems from the level of the common femoral vein and including the common femoral, femoral, profunda femoral, popliteal and calf veins including the posterior tibial, peroneal and gastrocnemius veins when visible. The superficial great saphenous vein was also interrogated. Spectral Doppler was utilized to evaluate flow at rest and with distal augmentation maneuvers in the common femoral, femoral and popliteal veins. COMPARISON:  None. FINDINGS: RIGHT LOWER EXTREMITY Common Femoral Vein: No evidence of thrombus. Normal compressibility, respiratory phasicity and response to augmentation. Saphenofemoral Junction: No evidence of thrombus. Normal compressibility and flow on color Doppler imaging. Profunda Femoral Vein: No evidence of thrombus. Normal compressibility and flow on color Doppler imaging. Femoral Vein: No evidence of thrombus. Normal compressibility, respiratory phasicity and response to augmentation. Popliteal Vein: No evidence of thrombus. Normal compressibility, respiratory phasicity and response to augmentation. Calf Veins: No evidence of thrombus. Normal compressibility and flow on color Doppler imaging. Superficial Great Saphenous Vein: No evidence of thrombus. Normal compressibility. Venous Reflux:  None. Other Findings: Note is made of several prominent though non pathologically enlarged right inguinal lymph nodes with dominant inguinal lymph node measuring approximately 0.9 cm in greatest short axis diameter and maintaining a benign fatty hilum. LEFT LOWER EXTREMITY Common Femoral Vein: No  evidence of thrombus. Normal compressibility, respiratory phasicity and response to augmentation. Saphenofemoral Junction: No evidence of thrombus. Normal compressibility and flow on color Doppler imaging. Profunda Femoral Vein: No evidence of thrombus. Normal compressibility and flow on color Doppler imaging. Femoral Vein: No evidence of thrombus. Normal compressibility, respiratory phasicity and response to augmentation. Popliteal Vein: No evidence of thrombus. Normal compressibility, respiratory phasicity and response to augmentation. Calf Veins: No evidence of thrombus. Normal compressibility and flow on color Doppler imaging. Superficial Great Saphenous Vein: Note is made of several prominent though non pathologically enlarged left inguinal lymph nodes with dominant left inguinal lymph node measuring 0.7 cm in greatest short axis diameter. Venous Reflux:  None. Other Findings:  None. IMPRESSION: No evidence of DVT within either lower extremity. Electronically Signed   By: Sandi Mariscal M.D.   On: 06/12/2017 16:42   Dg Chest Port 1 View  Result Date: 06/13/2017 CLINICAL DATA:  62 year old. Failed attempt at left IJ line placement. Initial encounter. EXAM: PORTABLE CHEST 1 VIEW COMPARISON:  11/29/2016 chest CT.  11/11/2013 chest x-ray. FINDINGS: Suspect prominent skin fold rather than pneumothorax. This can be confirmed on follow-up. Fullness of the right hilar region, underlying mass not excluded. CT chest can be obtained for further delineation Biapical parenchymal changes similar to prior  examination. 11/29/2016 chest CT recommended six-month follow-up to confirm stability of nodular density within the right lung apex. No infiltrate or congestive heart failure. Heart size within normal limits. Calcified aorta. Bilateral carotid bifurcation calcifications. No radiopaque foreign body. IMPRESSION: Suspect prominent skin fold rather than pneumothorax. This can be confirmed on follow-up. Fullness of the right  hilar region, underlying mass not excluded. CT chest can be obtained for further delineation Biapical parenchymal changes similar to prior examination. 11/29/2016 chest CT recommended six-month follow-up to confirm stability of nodular density within the right lung apex. Electronically Signed   By: Genia Del M.D.   On: 06/13/2017 06:43     Medications:   . sodium chloride    .  sodium bicarbonate (isotonic) infusion in sterile water 75 mL/hr at 06/14/17 0959   . amLODipine  5 mg Oral Daily  . bicalutamide  50 mg Oral Daily  . feeding supplement (ENSURE ENLIVE)  237 mL Oral BID BM  . [START ON 06/15/2017] heparin  5,000 Units Subcutaneous Q8H  . multivitamin  1 tablet Oral QHS  . multivitamin with minerals  1 tablet Oral Daily  . tamsulosin  0.4 mg Oral Daily   acetaminophen **OR** acetaminophen, ondansetron **OR** ondansetron (ZOFRAN) IV  Assessment/ Plan:  62 y.o. Caucasian male with hypertension, coronary atherosclerosis, emphysema, current smoker (1ppd) recently diagnosed metastatic prostate cancer, lymphadenopathy of the abdomen, bilateral hydronephrosis with right ureteral stent placement, was admitted on 06/12/2017 .  Patient had presented as outpatient for percutaneous left nephroureteral stent placement by vascular radiology but was found to have hyperkalemia and acute renal failure therefore he is admitted for further evaluation and management.  1.  Acute renal failure  2.  Chronic kidney disease st 3 with baseline creatinine 1.55/ GFR 48 from June 2018 3.  Metastatic prostate cancer with pelvic and abdominal adnopathy 4.  Bilateral hydronephrosis with right renal stent placement in December 2018 5.  Severe hyperkalemia  Patient underwent emergency dialysis for severe hyperkalemia.  His potassium level is now normal.  He is scheduled for percutaneous bilateral nephrostomy tubes placement.  If potassium level remains normal and serum creatinine continues to improve, we will  discontinue the dialysis catheter Volume status and electrolytes are acceptable No acute indication for dialysis at present Acidosis has corrected.  We will discontinue bicarb infusion.   LOS: [place] 1/16/201911:51 AM  [facility] [place], [place]

## 2017-06-14 NOTE — Plan of Care (Signed)
  Progressing Education: Knowledge of General Education information will improve 06/14/2017 0146 - Progressing by Loran Senters, RN Pain Managment: General experience of comfort will improve 06/14/2017 0146 - Progressing by Loran Senters, RN Safety: Ability to remain free from injury will improve 06/14/2017 0146 - Progressing by Loran Senters, RN

## 2017-06-14 NOTE — Progress Notes (Signed)
MEDICATION RELATED CONSULT NOTE - INITIAL   Pharmacy Consult for post-IR procedure consult - anticoagulant review  No Known Allergies  Assessment/Plan: Per consult, patient underwent a procedure with "standard" bleeding risk. Patient was on heparin 5000 units subcutaneously q8h for DVT prophylaxis.  Plan:  Per protocol, needs to be held at least 6 hours post-procedure. Will resume heparin 5000 units subcutaneously q8h tomorrow morning.  Lenis Noon, PharmD, BCPS Clinical Pharmacist 06/14/2017,7:24 PM

## 2017-06-14 NOTE — Plan of Care (Signed)
  Progressing Education: Knowledge of General Education information will improve 06/14/2017 1903 - Progressing by Alen Blew, RN Health Behavior/Discharge Planning: Ability to manage health-related needs will improve 06/14/2017 1903 - Progressing by Alen Blew, RN Nutrition: Adequate nutrition will be maintained 06/14/2017 1903 - Progressing by Roanna Epley D, RN Coping: Level of anxiety will decrease 06/14/2017 1903 - Progressing by Alen Blew, RN Pain Managment: General experience of comfort will improve 06/14/2017 1903 - Progressing by Alen Blew, RN Safety: Ability to remain free from injury will improve 06/14/2017 1903 - Progressing by Alen Blew, RN Activity: Ability to tolerate increased activity will improve 06/14/2017 1903 - Progressing by Alen Blew, RN

## 2017-06-14 NOTE — Progress Notes (Signed)
Urology Consult Follow Up  Subjective: Patient without complaints this am.  Just wants to go home.  UOP good - pink tinged urine.  VSS afebrile.  Creatinine at 5.82.  IR to place bilateral nephrostomy tubes today.   Anti-infectives: Anti-infectives (From admission, onward)   None      Current Facility-Administered Medications  Medication Dose Route Frequency Provider Last Rate Last Dose  . acetaminophen (TYLENOL) tablet 650 mg  650 mg Oral Q6H PRN [name], MD   650 mg at 06/13/17 4818   Or  . acetaminophen (TYLENOL) suppository 650 mg  650 mg Rectal Q6H PRN [name], MD      . amLODipine (NORVASC) tablet 5 mg  5 mg Oral Daily [name], MD   5 mg at 06/13/17 0934  . bicalutamide (CASODEX) tablet 50 mg  50 mg Oral Daily Stoioff, Scott C, MD   50 mg at 06/13/17 1419  . feeding supplement (ENSURE ENLIVE) (ENSURE ENLIVE) liquid 237 mL  237 mL Oral BID BM Dustin Flock, MD   237 mL at 06/13/17 1412  . [START ON 06/15/2017] heparin injection 5,000 Units  5,000 Units Subcutaneous Q8H Ardis Rowan, PA-C      . multivitamin (RENA-VIT) tablet 1 tablet  1 tablet Oral QHS Dustin Flock, MD   1 tablet at 06/13/17 2041  . multivitamin with minerals tablet 1 tablet  1 tablet Oral Daily Dustin Flock, MD   1 tablet at 06/13/17 1211  . ondansetron (ZOFRAN) tablet 4 mg  4 mg Oral Q6H PRN [name], MD       Or  . ondansetron (ZOFRAN) injection 4 mg  4 mg Intravenous Q6H PRN [name], MD      . sodium bicarbonate 150 mEq in sterile water 1,000 mL infusion   Intravenous Continuous [name], MD 75 mL/hr at 06/13/17 2041    . tamsulosin (FLOMAX) capsule 0.4 mg  0.4 mg Oral Daily [name], MD   0.4 mg at 06/13/17 0934     Objective: Vital signs in last 24 hours: Temp:  [98.4 F (36.9 C)-98.8 F (37.1 C)] 98.5 F (36.9 C) (01/16 0738) Pulse Rate:  [89-98] 92 (01/16 0738) Resp:  [16-22] 18 (01/16 0738) BP:  (120-140)/(56-71) 127/65 (01/16 0738) SpO2:  [96 %-97 %] 97 % (01/16 0738)  Intake/Output from previous day: 01/15 0701 - 01/16 0700 In: 2654.5 [P.O.:477; I.V.:2177.5] Out: 2630 [Urine:2630] Intake/Output this shift: No intake/output data recorded.   Physical Exam Constitutional: Well nourished. Alert and oriented, No acute distress. HEENT:  AT, moist mucus membranes. Trachea midline, no masses. Cardiovascular: No clubbing or cyanosis.  1+ pedal edema  Respiratory: Normal respiratory effort, no increased work of breathing. GI: Abdomen is soft, non tender, non distended, no abdominal masses. Liver and spleen not palpable.  No hernias appreciated.  Stool sample for occult testing is not indicated.   GU: No CVA tenderness.  No bladder fullness or masses.   Skin: No rashes, bruises or suspicious lesions. Lymph: No cervical or inguinal adenopathy. Neurologic: Grossly intact, no focal deficits, moving all 4 extremities. Psychiatric: Normal mood and affect.  Lab Results:  Recent Labs    06/12/17 1208 06/13/17 0350  WBC 6.7 5.5  HGB 11.3* 9.2*  HCT 33.2* 26.6*  PLT 140* 133*   BMET Recent Labs    06/13/17 0350 06/14/17 0534  NA 142 141  K 4.5 4.0  CL 100* 95*  CO2 23 29  GLUCOSE 93 98  BUN 69* 74*  CREATININE 5.37* 5.82*  CALCIUM 8.8* 8.6*   PT/INR Recent Labs    06/12/17 1208  LABPROT 15.3*  INR 1.22   ABG No results for input(s): PHART, HCO3 in the last 72 hours.  Invalid input(s): PCO2, PO2  Studies/Results: US Renal  Result Date: 06/12/2017 CLINICAL DATA:  62 year old male with acute renal failure. Initial encounter. EXAM: RENAL / URINARY TRACT ULTRASOUND COMPLETE COMPARISON:  04/27/2017 CT. FINDINGS: Right Kidney: Length: 11 cm. Mild increased echogenicity renal parenchyma. Moderate right hydronephrosis. Stent in place. Left Kidney: Length: 12.7 cm.  Moderate hydronephrosis. Bladder: Thickening of the posterior bladder spanning over 6.4 x 2.6.9 cm.  Question tumor. IMPRESSION: Moderate bilateral hydronephrosis as noted on prior CT. Right-sided stent has been placed. Thickened posterior bladder wall. Although prostate gland is enlarged and impressing upon the bladder, primary bladder lesion is a consideration. These results will be called to the ordering clinician or representative by the Radiologist Assistant, and communication documented in the PACS or zVision Dashboard. Electronically Signed   By: Genia Del M.D.   On: 06/12/2017 17:25   US Venous Img Lower Bilateral  Result Date: 06/12/2017 CLINICAL DATA:  Bilateral lower extremity pain and edema for the past 6 months. History of dog bite injury involving the right leg approximately 6-7 months ago. History of malignancy. Evaluate for DVT. EXAM: BILATERAL LOWER EXTREMITY VENOUS DOPPLER ULTRASOUND TECHNIQUE: Gray-scale sonography with graded compression, as well as color Doppler and duplex ultrasound were performed to evaluate the lower extremity deep venous systems from the level of the common femoral vein and including the common femoral, femoral, profunda femoral, popliteal and calf veins including the posterior tibial, peroneal and gastrocnemius veins when visible. The superficial great saphenous vein was also interrogated. Spectral Doppler was utilized to evaluate flow at rest and with distal augmentation maneuvers in the common femoral, femoral and popliteal veins. COMPARISON:  None. FINDINGS: RIGHT LOWER EXTREMITY Common Femoral Vein: No evidence of thrombus. Normal compressibility, respiratory phasicity and response to augmentation. Saphenofemoral Junction: No evidence of thrombus. Normal compressibility and flow on color Doppler imaging. Profunda Femoral Vein: No evidence of thrombus. Normal compressibility and flow on color Doppler imaging. Femoral Vein: No evidence of thrombus. Normal compressibility, respiratory phasicity and response to augmentation. Popliteal Vein: No evidence of thrombus.  Normal compressibility, respiratory phasicity and response to augmentation. Calf Veins: No evidence of thrombus. Normal compressibility and flow on color Doppler imaging. Superficial Great Saphenous Vein: No evidence of thrombus. Normal compressibility. Venous Reflux:  None. Other Findings: Note is made of several prominent though non pathologically enlarged right inguinal lymph nodes with dominant inguinal lymph node measuring approximately 0.9 cm in greatest short axis diameter and maintaining a benign fatty hilum. LEFT LOWER EXTREMITY Common Femoral Vein: No evidence of thrombus. Normal compressibility, respiratory phasicity and response to augmentation. Saphenofemoral Junction: No evidence of thrombus. Normal compressibility and flow on color Doppler imaging. Profunda Femoral Vein: No evidence of thrombus. Normal compressibility and flow on color Doppler imaging. Femoral Vein: No evidence of thrombus. Normal compressibility, respiratory phasicity and response to augmentation. Popliteal Vein: No evidence of thrombus. Normal compressibility, respiratory phasicity and response to augmentation. Calf Veins: No evidence of thrombus. Normal compressibility and flow on color Doppler imaging. Superficial Great Saphenous Vein: Note is made of several prominent though non pathologically enlarged left inguinal lymph nodes with dominant left inguinal lymph node measuring 0.7 cm in greatest short axis diameter. Venous Reflux:  None. Other Findings:  None. IMPRESSION: No evidence of DVT within either lower  extremity. Electronically Signed   By: Sandi Mariscal M.D.   On: 06/12/2017 16:42   Dg Chest Port 1 View  Result Date: 06/13/2017 CLINICAL DATA:  62 year old. Failed attempt at left IJ line placement. Initial encounter. EXAM: PORTABLE CHEST 1 VIEW COMPARISON:  11/29/2016 chest CT.  11/11/2013 chest x-ray. FINDINGS: Suspect prominent skin fold rather than pneumothorax. This can be confirmed on follow-up. Fullness of the  right hilar region, underlying mass not excluded. CT chest can be obtained for further delineation Biapical parenchymal changes similar to prior examination. 11/29/2016 chest CT recommended six-month follow-up to confirm stability of nodular density within the right lung apex. No infiltrate or congestive heart failure. Heart size within normal limits. Calcified aorta. Bilateral carotid bifurcation calcifications. No radiopaque foreign body. IMPRESSION: Suspect prominent skin fold rather than pneumothorax. This can be confirmed on follow-up. Fullness of the right hilar region, underlying mass not excluded. CT chest can be obtained for further delineation Biapical parenchymal changes similar to prior examination. 11/29/2016 chest CT recommended six-month follow-up to confirm stability of nodular density within the right lung apex. Electronically Signed   By: Genia Del M.D.   On: 06/13/2017 06:43     Assessment and Plan 1.  Locally invasive/metastatic prostate cancer                     - PSA and testosterone still pending this am                   - continue Casodex 50 mg daily                   2. Bilateral hydronephrosis with acute renal failure                  - bilateral percutaneous nephrostomy tube placement pending by IR today                  - continue to trend creatinine                          LOS: 2 days    [facility] 06/14/2017

## 2017-06-14 NOTE — Care Management Important Message (Signed)
Important Message  Patient Details  Name: KYON BENTLER MRN: [id number] Date of Birth: 1956-01-14   Medicare Important Message Given:  N/A - LOS <3 / Initial given by admissions    Katrina Stack, RN 06/14/2017, 3:51 PM

## 2017-06-14 NOTE — Sedation Documentation (Signed)
Information transferred from hard copy-downtime procedure form.

## 2017-06-14 NOTE — Progress Notes (Signed)
[place] [facility]                                                                                                                                                                                  Patient Demographics   Michael Small, is a 62 y.o. male, DOB - 1956/01/22, WUJ:[id number]  Admit date - 06/12/2017   Admitting Physician [name], MD  Outpatient Primary MD for the patient is [name], [name], MD   LOS - 2  Subjective: Patient was upset because the procedure was going to be canceled However he will now have bilateral nephrostomy tubes  Review of Systems:   CONSTITUTIONAL: No documented fever. No fatigue, weakness. No weight gain, no weight loss.  EYES: No blurry or double vision.  ENT: No tinnitus. No postnasal drip. No redness of the oropharynx.  RESPIRATORY: No cough, no wheeze, no hemoptysis. No dyspnea.  CARDIOVASCULAR: No chest pain. No orthopnea. No palpitations. No syncope.  GASTROINTESTINAL: No nausea, no vomiting or diarrhea. No abdominal pain. No melena or hematochezia.  GENITOURINARY: No dysuria or hematuria.  ENDOCRINE: No polyuria or nocturia. No heat or cold intolerance.  HEMATOLOGY: No anemia. No bruising. No bleeding.  INTEGUMENTARY: No rashes. No lesions.  MUSCULOSKELETAL: No arthritis. No swelling. No gout.  NEUROLOGIC: No numbness, tingling, or ataxia. No seizure-type activity.  PSYCHIATRIC: No anxiety. No insomnia. No ADD.    Vitals:   Vitals:   06/13/17 0500 06/13/17 1122 06/13/17 1549 06/14/17 1633  BP: 130/66 (!) 129/56 138/64 (!) 149/73  Pulse: 97 98 96 (!) 102  Resp: 17 18 (!) 22 17  Temp:  98.8 F (37.1 C) 98.7 F (37.1 C) 98.7 F (37.1 C)  TempSrc:  Oral Oral Oral  SpO2: 97% 96% 97% 93%  Weight:      Height:        Wt Readings from Last 3 Encounters:  06/12/17 154 lb 15.7 oz (70.3 kg)  06/12/17 159 lb (72.1 kg)  06/01/17 159 lb 12.8 oz (72.5 kg)     Intake/Output Summary (Last 24 hours)  at 06/14/2017 1651 Last data filed at 06/14/2017 1611 Gross per 24 hour  Intake 675 ml  Output 2355 ml  Net -1680 ml    Physical Exam:   GENERAL: Pleasant-appearing in no apparent distress.  HEAD, EYES, EARS, NOSE AND THROAT: Atraumatic, normocephalic. Extraocular muscles are intact. Pupils equal and reactive to light. Sclerae anicteric. No conjunctival injection. No oro-pharyngeal erythema.  NECK: Supple. There is no jugular venous distention. No bruits, no lymphadenopathy, no thyromegaly.  HEART: Regular rate and rhythm,. No murmurs, no rubs, no clicks.  LUNGS: Clear to auscultation bilaterally. No rales  or rhonchi. No wheezes.  ABDOMEN: Soft, flat, nontender, nondistended. Has good bowel sounds. No hepatosplenomegaly appreciated.  EXTREMITIES: No evidence of any cyanosis, clubbing, or peripheral edema.  +2 pedal and radial pulses bilaterally.  NEUROLOGIC: The patient is alert, awake, and oriented x3 with no focal motor or sensory deficits appreciated bilaterally.  SKIN: Moist and warm with no rashes appreciated.  Psych: Not anxious, depressed LN: No inguinal LN enlargement    Antibiotics   Anti-infectives (From admission, onward)   None      Medications   Scheduled Meds: . amLODipine  5 mg Oral Daily  . bicalutamide  50 mg Oral Daily  . feeding supplement (ENSURE ENLIVE)  237 mL Oral BID BM  . [START ON 06/15/2017] heparin  5,000 Units Subcutaneous Q8H  . multivitamin  1 tablet Oral QHS  . multivitamin with minerals  1 tablet Oral Daily  . tamsulosin  0.4 mg Oral Daily   Continuous Infusions: . sodium chloride 10 mL/hr at 06/14/17 1611   PRN Meds:.acetaminophen **OR** acetaminophen, ondansetron **OR** ondansetron (ZOFRAN) IV   Data Review:   Micro Results Recent Results (from the past 240 hour(s))  MRSA PCR Screening     Status: None   Collection Time: 06/12/17  8:39 PM  Result Value Ref Range Status   MRSA by PCR NEGATIVE NEGATIVE Final    Comment:        The  GeneXpert MRSA Assay (FDA approved for NASAL specimens only), is one component of a comprehensive MRSA colonization surveillance program. It is not intended to diagnose MRSA infection nor to guide or monitor treatment for MRSA infections. Performed at [facility], [address] [place], [place] [address]     Radiology Reports [place] Transrectal Complete  Result Date: 05/24/2017 Please see Notes or Procedures tab for imaging impression.  US Renal  Result Date: 06/12/2017 CLINICAL DATA:  62 year old male with acute renal failure. Initial encounter. EXAM: RENAL / URINARY TRACT ULTRASOUND COMPLETE COMPARISON:  04/27/2017 CT. FINDINGS: Right Kidney: Length: 11 cm. Mild increased echogenicity renal parenchyma. Moderate right hydronephrosis. Stent in place. Left Kidney: Length: 12.7 cm.  Moderate hydronephrosis. Bladder: Thickening of the posterior bladder spanning over 6.4 x 2.6.9 cm. Question tumor. IMPRESSION: Moderate bilateral hydronephrosis as noted on prior CT. Right-sided stent has been placed. Thickened posterior bladder wall. Although prostate gland is enlarged and impressing upon the bladder, primary bladder lesion is a consideration. These results will be called to the ordering clinician or representative by the Radiologist Assistant, and communication documented in the PACS or zVision Dashboard. Electronically Signed   By: Genia Del M.D.   On: 06/12/2017 17:25   US Venous Img Lower Bilateral  Result Date: 06/12/2017 CLINICAL DATA:  Bilateral lower extremity pain and edema for the past 6 months. History of dog bite injury involving the right leg approximately 6-7 months ago. History of malignancy. Evaluate for DVT. EXAM: BILATERAL LOWER EXTREMITY VENOUS DOPPLER ULTRASOUND TECHNIQUE: Gray-scale sonography with graded compression, as well as color Doppler and duplex ultrasound were performed to evaluate the lower extremity deep venous systems from the level of the common  femoral vein and including the common femoral, femoral, profunda femoral, popliteal and calf veins including the posterior tibial, peroneal and gastrocnemius veins when visible. The superficial great saphenous vein was also interrogated. Spectral Doppler was utilized to evaluate flow at rest and with distal augmentation maneuvers in the common femoral, femoral and popliteal veins. COMPARISON:  None. FINDINGS: RIGHT LOWER EXTREMITY Common Femoral Vein: No  evidence of thrombus. Normal compressibility, respiratory phasicity and response to augmentation. Saphenofemoral Junction: No evidence of thrombus. Normal compressibility and flow on color Doppler imaging. Profunda Femoral Vein: No evidence of thrombus. Normal compressibility and flow on color Doppler imaging. Femoral Vein: No evidence of thrombus. Normal compressibility, respiratory phasicity and response to augmentation. Popliteal Vein: No evidence of thrombus. Normal compressibility, respiratory phasicity and response to augmentation. Calf Veins: No evidence of thrombus. Normal compressibility and flow on color Doppler imaging. Superficial Great Saphenous Vein: No evidence of thrombus. Normal compressibility. Venous Reflux:  None. Other Findings: Note is made of several prominent though non pathologically enlarged right inguinal lymph nodes with dominant inguinal lymph node measuring approximately 0.9 cm in greatest short axis diameter and maintaining a benign fatty hilum. LEFT LOWER EXTREMITY Common Femoral Vein: No evidence of thrombus. Normal compressibility, respiratory phasicity and response to augmentation. Saphenofemoral Junction: No evidence of thrombus. Normal compressibility and flow on color Doppler imaging. Profunda Femoral Vein: No evidence of thrombus. Normal compressibility and flow on color Doppler imaging. Femoral Vein: No evidence of thrombus. Normal compressibility, respiratory phasicity and response to augmentation. Popliteal Vein: No evidence  of thrombus. Normal compressibility, respiratory phasicity and response to augmentation. Calf Veins: No evidence of thrombus. Normal compressibility and flow on color Doppler imaging. Superficial Great Saphenous Vein: Note is made of several prominent though non pathologically enlarged left inguinal lymph nodes with dominant left inguinal lymph node measuring 0.7 cm in greatest short axis diameter. Venous Reflux:  None. Other Findings:  None. IMPRESSION: No evidence of DVT within either lower extremity. Electronically Signed   By: Sandi Mariscal M.D.   On: 06/12/2017 16:42   Dg Chest Port 1 View  Result Date: 06/13/2017 CLINICAL DATA:  62 year old. Failed attempt at left IJ line placement. Initial encounter. EXAM: PORTABLE CHEST 1 VIEW COMPARISON:  11/29/2016 chest CT.  11/11/2013 chest x-ray. FINDINGS: Suspect prominent skin fold rather than pneumothorax. This can be confirmed on follow-up. Fullness of the right hilar region, underlying mass not excluded. CT chest can be obtained for further delineation Biapical parenchymal changes similar to prior examination. 11/29/2016 chest CT recommended six-month follow-up to confirm stability of nodular density within the right lung apex. No infiltrate or congestive heart failure. Heart size within normal limits. Calcified aorta. Bilateral carotid bifurcation calcifications. No radiopaque foreign body. IMPRESSION: Suspect prominent skin fold rather than pneumothorax. This can be confirmed on follow-up. Fullness of the right hilar region, underlying mass not excluded. CT chest can be obtained for further delineation Biapical parenchymal changes similar to prior examination. 11/29/2016 chest CT recommended six-month follow-up to confirm stability of nodular density within the right lung apex. Electronically Signed   By: Genia Del M.D.   On: 06/13/2017 06:43   [place] Prostate Biopsy Multiple  Result Date: 05/24/2017 Please see Notes or Procedures tab for imaging  impression.    CBC Recent Labs  Lab 06/12/17 1208 06/13/17 0350  WBC 6.7 5.5  HGB 11.3* 9.2*  HCT 33.2* 26.6*  PLT 140* 133*  MCV 90.9 90.0  MCH 30.9 31.1  MCHC 34.0 34.6  RDW 13.0 12.8    Chemistries  Recent Labs  Lab 06/12/17 1208 06/12/17 1808 06/12/17 2343 06/13/17 0350 06/14/17 0534  NA 139 137  --  142 141  K 6.3* 7.2* 4.7 4.5 4.0  CL 104 106  --  100* 95*  CO2 16* 16*  --  23 29  GLUCOSE 107* 92  --  93 98  BUN 108* 110*  --  69* 74*  CREATININE 6.91* 7.23*  --  5.37* 5.82*  CALCIUM 9.7 9.3  --  8.8* 8.6*  AST  --   --   --  16  --   ALT  --   --   --  10*  --   ALKPHOS  --   --   --  83  --   BILITOT  --   --   --  1.1  --    ------------------------------------------------------------------------------------------------------------------ estimated creatinine clearance is 13.1 mL/min (A) (by C-G formula based on SCr of 5.82 mg/dL (H)). ------------------------------------------------------------------------------------------------------------------ No results for input(s): HGBA1C in the last 72 hours. ------------------------------------------------------------------------------------------------------------------ No results for input(s): CHOL, HDL, LDLCALC, TRIG, CHOLHDL, LDLDIRECT in the last 72 hours. ------------------------------------------------------------------------------------------------------------------ Recent Labs    06/12/17 1808  TSH 2.144   ------------------------------------------------------------------------------------------------------------------ No results for input(s): VITAMINB12, FOLATE, FERRITIN, TIBC, IRON, RETICCTPCT in the last 72 hours.  Coagulation profile Recent Labs  Lab 06/12/17 1208  INR 1.22    No results for input(s): DDIMER in the last 72 hours.  Cardiac Enzymes No results for input(s): CKMB, TROPONINI, MYOGLOBIN in the last 168 hours.  Invalid input(s):  CK ------------------------------------------------------------------------------------------------------------------ Invalid input(s): [place]   Kolin Erdahl  is a 62 y.o. male with a known history of hypertension, CAD status post stent about 5 years ago, recent diagnosis of prostate cancer 3 weeks ago presents to hospital from radiology suite secondary to abnormal labs.  1. Hyperkalemia-secondary to acute renal failure. -Now resolved  2. Acute renal failure on CKD-  due to bilateral hydronephrosis Patient getting bilateral nephrostomy tubes All her renal function tomorrow morning  3. Hypertension-continue Norvasc  4. Prostate cancer-follow-up with oncology as schedule later this week. Also follows with urology as outpatient. Status post hormonal injection done  5. CAD status post stent-no active cardiac symptoms. Hold aspirin as he needs a procedure.  6. DVT prophylaxis heparin subcutaneous heparin       Code Status Orders  (From admission, onward)        Start     Ordered   06/12/17 1756  Full code  Continuous     06/12/17 1757    Code Status History    Date Active Date Inactive Code Status Order ID Comments User Context   06/12/2017 16:54 06/12/2017 16:56 Full Code [id number]  [name], MD [facility], urology  DVT Prophylaxis SCDs  Lab Results  Component Value Date   PLT 133 (L) 06/13/2017     Time Spent in minutes 35 minutes greater than 50% of time spent in care coordination and counseling patient regarding the condition and plan of care.   Dustin Flock M.D on 06/14/2017 at 4:51 PM  Between 7am to 6pm - Pager - [phone]  After 6pm go to [email or web address] - password [email or web address] [facility]   Office  [phone]

## 2017-06-14 NOTE — Care Management (Signed)
patient was to have bilateral nephrostomy tube placement and interventional radiology cancelled the procedure.  Attending spoke with the physician and informed the procedure will be rescheduled for 11.17. Potassium level within normal limits.  At present no indication for continued dialysis per nephrology.

## 2017-06-14 NOTE — Procedures (Signed)
  Procedure: Bilat perc nephrostomies placed under [place] and fluoro  12f EBL:   minimal Complications:  none immediate  See full dictation in [facility].  Dillard Cannon MD Main # [phone] Pager  [phone]

## 2017-06-15 ENCOUNTER — Telehealth: Payer: Self-pay | Admitting: Family Medicine

## 2017-06-15 ENCOUNTER — Ambulatory Visit
Admission: RE | Admit: 2017-06-15 | Discharge: 2017-06-15 | Disposition: A | Discharge status: Home / Self Care | Payer: Medicare Other | Source: Ambulatory Visit | Attending: Physician Assistant | Admitting: Physician Assistant

## 2017-06-15 ENCOUNTER — Other Ambulatory Visit: Payer: Medicare Other

## 2017-06-15 ENCOUNTER — Encounter: Payer: Self-pay | Admitting: Interventional Radiology

## 2017-06-15 DIAGNOSIS — N1330 Unspecified hydronephrosis: Secondary | ICD-10-CM

## 2017-06-15 HISTORY — PX: IR NEPHROSTOMY PLACEMENT LEFT: IMG6063

## 2017-06-15 LAB — BASIC METABOLIC PANEL
Anion gap: 14 (ref 5–15)
BUN: 80 mg/dL — AB (ref 6–20)
CO2: 27 mmol/L (ref 22–32)
CREATININE: 5.52 mg/dL — AB (ref 0.61–1.24)
Calcium: 8.4 mg/dL — ABNORMAL LOW (ref 8.9–10.3)
Chloride: 96 mmol/L — ABNORMAL LOW (ref 101–111)
GFR calc Af Amer: 12 mL/min — ABNORMAL LOW (ref >60)
GFR, EST NON AFRICAN AMERICAN: 10 mL/min — AB (ref >60)
Glucose, Bld: 101 mg/dL — ABNORMAL HIGH (ref 65–99)
POTASSIUM: 3.7 mmol/L (ref 3.5–5.1)
SODIUM: 137 mmol/L (ref 135–145)

## 2017-06-15 LAB — TESTOSTERONE: TESTOSTERONE: 98 ng/dL — AB (ref 264–916)

## 2017-06-15 MED ORDER — DEGARELIX ACETATE 120 MG ~~LOC~~ SOLR
240.0000 mg | Freq: Once | SUBCUTANEOUS | Status: AC
Start: 2017-06-15 — End: 2017-06-15
  Administered 2017-06-15: 240 mg via SUBCUTANEOUS
  Filled 2017-06-15: qty 6

## 2017-06-15 NOTE — Discharge Summary (Addendum)
[facility] [place] [facility] [place] NAME: Michael Small    MR#:  [id number]  DATE OF BIRTH:  21-Nov-1955  DATE OF ADMISSION:  06/12/2017   ADMITTING PHYSICIAN: [name], MD  DATE OF DISCHARGE: 06/15/2017  3:15 PM  PRIMARY CARE PHYSICIAN: Birdie Sons, MD   ADMISSION DIAGNOSIS:  Swelling [R60.9] ARF (acute renal failure) ([place]) [N17.9] DISCHARGE DIAGNOSIS:  Active Problems:   Hyperkalemia   Protein-calorie malnutrition, severe  SECONDARY DIAGNOSIS:   Past Medical History:  Diagnosis Date  . Depression   . Hyperlipidemia   . Hypertension   . Myocardial infarction [facility])    s/p stent  . Prostate cancer [facility])    HOSPITAL COURSE:  Michael Small is a 62 y.o. malewith a known history of hypertension, CAD status post stent about 5 years ago, recent diagnosis of prostate cancer 3 weeks ago presents to hospital from radiology suite secondary to abnormal labs.  1. Hyperkalemia-secondary to acute renal failure. -Now resolved  2. Acute renal failure on CKD-  due to bilateral hydronephrosis Patient got bilateral nephrostomy tubes, follow-up with urologist as outpatient. Renal function is a little better, follow-up BMP as outpatient per Dr. Candiss Norse.  3. Hypertension-continue Norvasc  4. Prostate cancer-follow-up with oncology as scheduled. Also follows with urology as outpatient. Status post hormonal injection done  5. CAD status post stent-no active cardiac symptoms. Hold aspirin due to hematuria. Tobacco abuse.  Smoking cessation was counseled for 4 minutes. I discussed with Dr. Candiss Norse. DISCHARGE CONDITIONS:  Stable, discharged to home today. CONSULTS OBTAINED:  Treatment Team:  [name], MD DRUG ALLERGIES:  No Known Allergies DISCHARGE MEDICATIONS:   Allergies as of 06/15/2017   No Known Allergies     Medication List    STOP taking these medications   aspirin 81 MG tablet   ibuprofen 200 MG tablet Commonly known  as:  ADVIL,MOTRIN     TAKE these medications   amLODipine 5 MG tablet Commonly known as:  NORVASC TAKE 1 TABLET BY MOUTH EVERY DAY   diphenoxylate-atropine 2.5-0.025 MG tablet Commonly known as:  LOMOTIL TAKE 1 TABLET BY MOUTH 2 (TWO) TIMES DAILY AS NEEDED FOR DIARRHEA OR LOOSE STOOLS.   HYDROcodone-acetaminophen 5-325 MG tablet Commonly known as:  NORCO/VICODIN Take 1 tablet by mouth every 4 (four) hours as needed for moderate pain.   tamsulosin 0.4 MG Caps capsule Commonly known as:  FLOMAX Take 1 capsule (0.4 mg total) by mouth daily.        DISCHARGE INSTRUCTIONS:  See AVS.  If you experience worsening of your admission symptoms, develop shortness of breath, life threatening emergency, suicidal or homicidal thoughts you must seek medical attention immediately by calling 911 or calling your MD immediately  if symptoms less severe.  You Must read complete instructions/literature along with all the possible adverse reactions/side effects for all the Medicines you take and that have been prescribed to you. Take any new Medicines after you have completely understood and accpet all the possible adverse reactions/side effects.   Please note  You were cared for by a hospitalist during your hospital stay. If you have any questions about your discharge medications or the care you received while you were in the hospital after you are discharged, you can call the unit and asked to speak with the hospitalist on call if the hospitalist that took care of you is not available. Once you are discharged, your primary care physician will handle any further medical issues.  Please note that NO REFILLS for any discharge medications will be authorized once you are discharged, as it is imperative that you return to your primary care physician (or establish a relationship with a primary care physician if you do not have one) for your aftercare needs so that they can reassess your need for medications and  monitor your lab values.    On the day of Discharge:  VITAL SIGNS:  Blood pressure 131/69, pulse (!) 102, temperature 98.7 F (37.1 C), temperature source Oral, resp. rate 19, height 5\' 11"  (1.803 m), weight 154 lb 15.7 oz (70.3 kg), SpO2 98 %. PHYSICAL EXAMINATION:  GENERAL:  62 y.o.-year-old patient lying in the bed with no acute distress.  EYES: Pupils equal, round, reactive to light and accommodation. No scleral icterus. Extraocular muscles intact.  HEENT: Head atraumatic, normocephalic. Oropharynx and nasopharynx clear.  NECK:  Supple, no jugular venous distention. No thyroid enlargement, no tenderness.  LUNGS: Normal breath sounds bilaterally, no wheezing, rales,rhonchi or crepitation. No use of accessory muscles of respiration.  CARDIOVASCULAR: S1, S2 normal. No murmurs, rubs, or gallops.  ABDOMEN: Soft, non-tender, non-distended. Bowel sounds present. No organomegaly or mass.  EXTREMITIES: No pedal edema, cyanosis, or clubbing.  NEUROLOGIC: Cranial nerves II through XII are intact. Muscle strength 5/5 in all extremities. Sensation intact. Gait not checked.  PSYCHIATRIC: The patient is alert and oriented x 3.  SKIN: No obvious rash, lesion, or ulcer.  DATA REVIEW:   CBC Recent Labs  Lab 06/13/17 0350  WBC 5.5  HGB 9.2*  HCT 26.6*  PLT 133*    Chemistries  Recent Labs  Lab 06/13/17 0350  06/15/17 0420  NA 142   < > 137  K 4.5   < > 3.7  CL 100*   < > 96*  CO2 23   < > 27  GLUCOSE 93   < > 101*  BUN 69*   < > 80*  CREATININE 5.37*   < > 5.52*  CALCIUM 8.8*   < > 8.4*  AST 16  --   --   ALT 10*  --   --   ALKPHOS 83  --   --   BILITOT 1.1  --   --    < > = values in this interval not displayed.     Microbiology Results  Results for orders placed or performed during the hospital encounter of 06/12/17  MRSA PCR Screening     Status: None   Collection Time: 06/12/17  8:39 PM  Result Value Ref Range Status   MRSA by PCR NEGATIVE NEGATIVE Final    Comment:         The GeneXpert MRSA Assay (FDA approved for NASAL specimens only), is one component of a comprehensive MRSA colonization surveillance program. It is not intended to diagnose MRSA infection nor to guide or monitor treatment for MRSA infections. Performed at [facility], [address] [place], [place] [address]     RADIOLOGY:  [place] Intraoperative  Result Date: 06/14/2017 CLINICAL DATA:  Ultrasound was provided for use by the ordering physician, and a technical charge was applied by the performing facility.  No radiologist interpretation/professional services rendered.   Ir Nephrostomy Placement Left  Result Date: 06/15/2017 CLINICAL DATA:  Bilateral hydronephrosis. Previous right ureteral stent placement. Elevated creatinine. EXAM: BILATERAL PERCUTANEOUS NEPHROSTOMY CATHETER PLACEMENT UNDER ULTRASOUND AND FLUOROSCOPIC GUIDANCE FLUOROSCOPY TIME:  0.9 minutes; 32 mGy TECHNIQUE: The procedure, risks (including but not limited to bleeding, infection, organ damage ),  benefits, and alternatives were explained to the patient. Questions regarding the procedure were encouraged and answered. The patient understands and consents to the procedure. Bilateral flank regions prepped with chlorhexidine, draped in usual sterile fashion, infiltrated locally with 1% lidocaine. As antibiotic prophylaxis, cefazolin 1 g was ordered pre-procedure and administered intravenously within one hour of incision. Intravenous Fentanyl and Versed were administered as conscious sedation during continuous monitoring of the patient's level of consciousness and physiological / cardiorespiratory status by the radiology RN, with a total moderate sedation time of 17 minutes. Under real-time ultrasound guidance, a 21-gauge trocar needle was advanced into a right posterior lower pole calyx. Ultrasound image documentation was saved. Urine spontaneously returned through the needle. Needle was exchanged over a guidewire for  transitional dilator. Contrast injection confirmed appropriate positioning. Catheter was exchanged over a guidewire for a 10 French pigtail catheter, formed centrally within the right renal collecting system. Contrast injection confirms appropriate positioning and patency. In similar fashion, under real-time ultrasound guidance, a 21-gauge trocar needle was advanced into a left posterior lower pole calyx. Ultrasound image documentation was saved. Urine spontaneously returned through the needle. Needle was exchanged over a guidewire for transitional dilator. Contrast injection confirmed appropriate positioning. Catheter was exchanged over a guidewire for a 10 French pigtail catheter, formed centrally within the left renal collecting system. Contrast injection confirms appropriate positioning and patency. Catheters secured externally with 0 Prolene suture and StatLocks and placed to external drain bags. The patient tolerated the procedure well. COMPLICATIONS: COMPLICATIONS none IMPRESSION: 1. Technically successful bilateral percutaneous nephrostomy catheter placement. Electronically Signed   By: Lucrezia Europe M.D.   On: 06/15/2017 08:26   Ir Nephrostomy Placement Right  Result Date: 06/15/2017 CLINICAL DATA:  Bilateral hydronephrosis. Previous right ureteral stent placement. Elevated creatinine. EXAM: BILATERAL PERCUTANEOUS NEPHROSTOMY CATHETER PLACEMENT UNDER ULTRASOUND AND FLUOROSCOPIC GUIDANCE FLUOROSCOPY TIME:  0.9 minutes; 32 mGy TECHNIQUE: The procedure, risks (including but not limited to bleeding, infection, organ damage ), benefits, and alternatives were explained to the patient. Questions regarding the procedure were encouraged and answered. The patient understands and consents to the procedure. Bilateral flank regions prepped with chlorhexidine, draped in usual sterile fashion, infiltrated locally with 1% lidocaine. As antibiotic prophylaxis, cefazolin 1 g was ordered pre-procedure and administered  intravenously within one hour of incision. Intravenous Fentanyl and Versed were administered as conscious sedation during continuous monitoring of the patient's level of consciousness and physiological / cardiorespiratory status by the radiology RN, with a total moderate sedation time of 17 minutes. Under real-time ultrasound guidance, a 21-gauge trocar needle was advanced into a right posterior lower pole calyx. Ultrasound image documentation was saved. Urine spontaneously returned through the needle. Needle was exchanged over a guidewire for transitional dilator. Contrast injection confirmed appropriate positioning. Catheter was exchanged over a guidewire for a 10 French pigtail catheter, formed centrally within the right renal collecting system. Contrast injection confirms appropriate positioning and patency. In similar fashion, under real-time ultrasound guidance, a 21-gauge trocar needle was advanced into a left posterior lower pole calyx. Ultrasound image documentation was saved. Urine spontaneously returned through the needle. Needle was exchanged over a guidewire for transitional dilator. Contrast injection confirmed appropriate positioning. Catheter was exchanged over a guidewire for a 10 French pigtail catheter, formed centrally within the left renal collecting system. Contrast injection confirms appropriate positioning and patency. Catheters secured externally with 0 Prolene suture and StatLocks and placed to external drain bags. The patient tolerated the procedure well. COMPLICATIONS: COMPLICATIONS none IMPRESSION: 1. Technically successful bilateral  percutaneous nephrostomy catheter placement. Electronically Signed   By: Lucrezia Europe M.D.   On: 06/15/2017 08:26     Management plans discussed with the patient, wife  and they are in agreement.  CODE STATUS: Full Code   TOTAL TIME TAKING CARE OF THIS PATIENT: 33 minutes.    Demetrios Loll M.D on 06/15/2017 at 3:16 PM  Between 7am to 6pm - Pager -  [phone]  After 6pm go to [email or web address] - Proofreader  [facility]  Office  [phone]  CC: Primary care physician; Birdie Sons, MD   Note: This dictation was prepared with Dragon dictation along with smaller phrase technology. Any transcriptional errors that result from this process are unintentional.

## 2017-06-15 NOTE — Consult Note (Signed)
PSA > 3000; T level not castrate at 98 Recc Degarelix 240 mg sq; Does not need to continue Casodex

## 2017-06-15 NOTE — Care Management (Signed)
Patient to discharge home today.  Ended up having the bilateral nephrostomy tube placement 1.16.2019.  Discussed need for return demonstration of nephrostomy tube management during progression and with patient's wife.

## 2017-06-15 NOTE — Progress Notes (Signed)
Patient and wife educated and given information on degerdelix injection.  Reviewed side effects and and site care. Both injection given in LLQ approximately 3-4 inches apart.  Patient denied pain and no bleeding noted at injection site.

## 2017-06-15 NOTE — Care Management Important Message (Signed)
Important Message  Patient Details  Name: Michael Small MRN: [id number] Date of Birth: [birth year]   Medicare Important Message Given:  Yes Signed IM notice given - did ot discharge after all on 1.16.2019   Katrina Stack, RN 06/15/2017, 11:36 AM

## 2017-06-15 NOTE — Progress Notes (Signed)
[facility] [place], [place] 06/15/17  Subjective:   Patient had bilateral nephrostomy tubes placed yesterday Urine output 2550 cc Serum creatinine slightly improved to 5.52 Potassium is low normal at 3.7 Patient denies any shortness of breath, nausea or vomiting He iIs able to eat  Objective:  Vital signs in last 24 hours:  Temp:  [97.5 F (36.4 C)-99.3 F (37.4 C)] 97.5 F (36.4 C) (01/17 0755) Pulse Rate:  [87-109] 87 (01/17 0755) Resp:  [14-23] 18 (01/17 0755) BP: (130-174)/(55-77) 138/69 (01/17 0755) SpO2:  [91 %-100 %] 98 % (01/17 0755)  Weight change:  Filed Weights   06/12/17 1733 06/12/17 2200  Weight: 70.3 kg (155 lb) 70.3 kg (154 lb 15.7 oz)    Intake/Output:    Intake/Output Summary (Last 24 hours) at 06/15/2017 1643 Last data filed at 06/15/2017 1210 Gross per 24 hour  Intake 118.17 ml  Output 3375 ml  Net -3256.83 ml     Physical Exam: General:  Sitting up in chair, no acute distress  HEENT  anicteric  Neck  supple  Pulm/lungs  normal breathing effort, clear to auscultation  CVS/Heart  regular rhythm, no rub or gallop  Abdomen:   Soft, nontender, nondistended  Extremities:  2+ edema  Neurologic:  Alert, oriented  Skin:  No acute rashes  Access:  Right femoral temporary dialysis catheter   B/l nephrostomy tubes    Basic Metabolic Panel:  Recent Labs  Lab 06/12/17 1208 06/12/17 1808 06/12/17 2343 06/13/17 0350 06/14/17 0534 06/15/17 0420  NA 139 137  --  142 141 137  K 6.3* 7.2* 4.7 4.5 4.0 3.7  CL 104 106  --  100* 95* 96*  CO2 16* 16*  --  23 29 27   GLUCOSE 107* 92  --  93 98 101*  BUN 108* 110*  --  69* 74* 80*  CREATININE 6.91* 7.23*  --  5.37* 5.82* 5.52*  CALCIUM 9.7 9.3  --  8.8* 8.6* 8.4*     CBC: Recent Labs  Lab 06/12/17 1208 06/13/17 0350  WBC 6.7 5.5  HGB 11.3* 9.2*  HCT 33.2* 26.6*  MCV 90.9 90.0  PLT 140* 133*      Lab Results  Component Value Date   HEPBSAG Negative 06/12/2017    HEPBSAB Non Reactive 06/12/2017      Microbiology:  Recent Results (from the past 240 hour(s))  MRSA PCR Screening     Status: None   Collection Time: 06/12/17  8:39 PM  Result Value Ref Range Status   MRSA by PCR NEGATIVE NEGATIVE Final    Comment:        The GeneXpert MRSA Assay (FDA approved for NASAL specimens only), is one component of a comprehensive MRSA colonization surveillance program. It is not intended to diagnose MRSA infection nor to guide or monitor treatment for MRSA infections. Performed at [facility], [address] [place], [place] [address]     Coagulation Studies: No results for input(s): LABPROT, INR in the last 72 hours.  Urinalysis: No results for input(s): COLORURINE, LABSPEC, PHURINE, GLUCOSEU, HGBUR, BILIRUBINUR, KETONESUR, PROTEINUR, UROBILINOGEN, NITRITE, LEUKOCYTESUR in the last 72 hours.  Invalid input(s): APPERANCEUR    Imaging: [place] Intraoperative  Result Date: 06/14/2017 CLINICAL DATA:  Ultrasound was provided for use by the ordering physician, and a technical charge was applied by the performing facility.  No radiologist interpretation/professional services rendered.   Ir Nephrostomy Placement Left  Result Date: 06/15/2017 CLINICAL DATA:  Bilateral hydronephrosis. Previous right ureteral  stent placement. Elevated creatinine. EXAM: BILATERAL PERCUTANEOUS NEPHROSTOMY CATHETER PLACEMENT UNDER ULTRASOUND AND FLUOROSCOPIC GUIDANCE FLUOROSCOPY TIME:  0.9 minutes; 32 mGy TECHNIQUE: The procedure, risks (including but not limited to bleeding, infection, organ damage ), benefits, and alternatives were explained to the patient. Questions regarding the procedure were encouraged and answered. The patient understands and consents to the procedure. Bilateral flank regions prepped with chlorhexidine, draped in usual sterile fashion, infiltrated locally with 1% lidocaine. As antibiotic prophylaxis, cefazolin 1 g was ordered pre-procedure and  administered intravenously within one hour of incision. Intravenous Fentanyl and Versed were administered as conscious sedation during continuous monitoring of the patient's level of consciousness and physiological / cardiorespiratory status by the radiology RN, with a total moderate sedation time of 17 minutes. Under real-time ultrasound guidance, a 21-gauge trocar needle was advanced into a right posterior lower pole calyx. Ultrasound image documentation was saved. Urine spontaneously returned through the needle. Needle was exchanged over a guidewire for transitional dilator. Contrast injection confirmed appropriate positioning. Catheter was exchanged over a guidewire for a 10 French pigtail catheter, formed centrally within the right renal collecting system. Contrast injection confirms appropriate positioning and patency. In similar fashion, under real-time ultrasound guidance, a 21-gauge trocar needle was advanced into a left posterior lower pole calyx. Ultrasound image documentation was saved. Urine spontaneously returned through the needle. Needle was exchanged over a guidewire for transitional dilator. Contrast injection confirmed appropriate positioning. Catheter was exchanged over a guidewire for a 10 French pigtail catheter, formed centrally within the left renal collecting system. Contrast injection confirms appropriate positioning and patency. Catheters secured externally with 0 Prolene suture and StatLocks and placed to external drain bags. The patient tolerated the procedure well. COMPLICATIONS: COMPLICATIONS none IMPRESSION: 1. Technically successful bilateral percutaneous nephrostomy catheter placement. Electronically Signed   By: Lucrezia Europe M.D.   On: 06/15/2017 08:26   Ir Nephrostomy Placement Right  Result Date: 06/15/2017 CLINICAL DATA:  Bilateral hydronephrosis. Previous right ureteral stent placement. Elevated creatinine. EXAM: BILATERAL PERCUTANEOUS NEPHROSTOMY CATHETER PLACEMENT UNDER  ULTRASOUND AND FLUOROSCOPIC GUIDANCE FLUOROSCOPY TIME:  0.9 minutes; 32 mGy TECHNIQUE: The procedure, risks (including but not limited to bleeding, infection, organ damage ), benefits, and alternatives were explained to the patient. Questions regarding the procedure were encouraged and answered. The patient understands and consents to the procedure. Bilateral flank regions prepped with chlorhexidine, draped in usual sterile fashion, infiltrated locally with 1% lidocaine. As antibiotic prophylaxis, cefazolin 1 g was ordered pre-procedure and administered intravenously within one hour of incision. Intravenous Fentanyl and Versed were administered as conscious sedation during continuous monitoring of the patient's level of consciousness and physiological / cardiorespiratory status by the radiology RN, with a total moderate sedation time of 17 minutes. Under real-time ultrasound guidance, a 21-gauge trocar needle was advanced into a right posterior lower pole calyx. Ultrasound image documentation was saved. Urine spontaneously returned through the needle. Needle was exchanged over a guidewire for transitional dilator. Contrast injection confirmed appropriate positioning. Catheter was exchanged over a guidewire for a 10 French pigtail catheter, formed centrally within the right renal collecting system. Contrast injection confirms appropriate positioning and patency. In similar fashion, under real-time ultrasound guidance, a 21-gauge trocar needle was advanced into a left posterior lower pole calyx. Ultrasound image documentation was saved. Urine spontaneously returned through the needle. Needle was exchanged over a guidewire for transitional dilator. Contrast injection confirmed appropriate positioning. Catheter was exchanged over a guidewire for a 10 French pigtail catheter, formed centrally within the left renal collecting system.  Contrast injection confirms appropriate positioning and patency. Catheters secured  externally with 0 Prolene suture and StatLocks and placed to external drain bags. The patient tolerated the procedure well. COMPLICATIONS: COMPLICATIONS none IMPRESSION: 1. Technically successful bilateral percutaneous nephrostomy catheter placement. Electronically Signed   By: Lucrezia Europe M.D.   On: 06/15/2017 08:26     Medications:   . sodium chloride 10 mL/hr at 06/14/17 1611   . amLODipine  5 mg Oral Daily  . bicalutamide  50 mg Oral Daily  . feeding supplement (ENSURE ENLIVE)  237 mL Oral BID BM  . heparin injection (subcutaneous)  5,000 Units Subcutaneous Q8H  . multivitamin  1 tablet Oral QHS  . multivitamin with minerals  1 tablet Oral Daily  . tamsulosin  0.4 mg Oral Daily   acetaminophen **OR** acetaminophen, ondansetron **OR** ondansetron (ZOFRAN) IV  Assessment/ Plan:  62 y.o. Caucasian male with hypertension, coronary atherosclerosis, emphysema, current smoker (1ppd) recently diagnosed metastatic prostate cancer, lymphadenopathy of the abdomen, bilateral hydronephrosis with right ureteral stent placement, was admitted on 06/12/2017 .  Patient had presented as outpatient for percutaneous left nephroureteral stent placement by vascular radiology but was found to have hyperkalemia and acute renal failure therefore he is admitted for further evaluation and management.  1.  Acute renal failure  2.  Chronic kidney disease st 3 with baseline creatinine 1.55/ GFR 48 from June 2018 3.  Metastatic prostate cancer with pelvic and abdominal adnopathy 4.  Bilateral hydronephrosis with right renal stent placement in December 2018 5.  Severe hyperkalemia  Patient underwent emergency dialysis for severe hyperkalemia.  His potassium level is now normal.  He has had bilateral nephrostomy tubes placed by VIR May discontinue dialysis catheter Follow urology recommendations F/u in office 2-3 weeks   LOS: 3 Laxmi Choung [facility] 1/17/20194:43 [facility] [place],  [place]

## 2017-06-15 NOTE — Progress Notes (Signed)
Pt discharged home this afternoon with wife, pt and wife educated on dressing change and nephrostomy tube care, VSS, no complaints at this time. Pt and wife aware of follow up appointments and medication changes.

## 2017-06-15 NOTE — Telephone Encounter (Signed)
[facility] called for hospital f/u.  Patient is being discharged on 06/15/2017 for hyperkalemia.  His HFU is on 06/19/2017 at 11.

## 2017-06-16 ENCOUNTER — Inpatient Hospital Stay: Payer: Medicare Other | Attending: Hematology and Oncology | Admitting: Hematology and Oncology

## 2017-06-16 ENCOUNTER — Ambulatory Visit: Payer: Medicare Other

## 2017-06-16 ENCOUNTER — Other Ambulatory Visit: Payer: Self-pay

## 2017-06-16 ENCOUNTER — Telehealth: Payer: Self-pay

## 2017-06-16 VITALS — BP 151/76 | HR 102 | Temp 95.9°F | Resp 20 | Ht 71.0 in | Wt 155.5 lb

## 2017-06-16 DIAGNOSIS — I10 Essential (primary) hypertension: Secondary | ICD-10-CM | POA: Diagnosis not present

## 2017-06-16 DIAGNOSIS — C7989 Secondary malignant neoplasm of other specified sites: Secondary | ICD-10-CM | POA: Diagnosis not present

## 2017-06-16 DIAGNOSIS — E875 Hyperkalemia: Secondary | ICD-10-CM | POA: Diagnosis not present

## 2017-06-16 DIAGNOSIS — I252 Old myocardial infarction: Secondary | ICD-10-CM | POA: Diagnosis not present

## 2017-06-16 DIAGNOSIS — C61 Malignant neoplasm of prostate: Secondary | ICD-10-CM | POA: Diagnosis not present

## 2017-06-16 DIAGNOSIS — F17290 Nicotine dependence, other tobacco product, uncomplicated: Secondary | ICD-10-CM | POA: Diagnosis not present

## 2017-06-16 DIAGNOSIS — R197 Diarrhea, unspecified: Secondary | ICD-10-CM

## 2017-06-16 DIAGNOSIS — C7951 Secondary malignant neoplasm of bone: Secondary | ICD-10-CM | POA: Diagnosis not present

## 2017-06-16 DIAGNOSIS — N179 Acute kidney failure, unspecified: Secondary | ICD-10-CM

## 2017-06-16 DIAGNOSIS — N289 Disorder of kidney and ureter, unspecified: Secondary | ICD-10-CM

## 2017-06-16 DIAGNOSIS — R634 Abnormal weight loss: Secondary | ICD-10-CM

## 2017-06-16 NOTE — Patient Instructions (Signed)
Docetaxel injection What is this medicine? DOCETAXEL (doe se TAX el) is a chemotherapy drug. It targets fast dividing cells, like cancer cells, and causes these cells to die. This medicine is used to treat many types of cancers like breast cancer, certain stomach cancers, head and neck cancer, lung cancer, and prostate cancer. This medicine may be used for other purposes; ask your health care provider or pharmacist if you have questions. COMMON BRAND NAME(S): Docefrez, Taxotere What should I tell my health care provider before I take this medicine? They need to know if you have any of these conditions: -infection (especially a virus infection such as chickenpox, cold sores, or herpes) -liver disease -low blood counts, like low white cell, platelet, or red cell counts -an unusual or allergic reaction to docetaxel, polysorbate 80, other chemotherapy agents, other medicines, foods, dyes, or preservatives -pregnant or trying to get pregnant -breast-feeding How should I use this medicine? This drug is given as an infusion into a vein. It is administered in a hospital or clinic by a specially trained health care professional. Talk to your pediatrician regarding the use of this medicine in children. Special care may be needed. Overdosage: If you think you have taken too much of this medicine contact a poison control center or emergency room at once. NOTE: This medicine is only for you. Do not share this medicine with others. What if I miss a dose? It is important not to miss your dose. Call your doctor or health care professional if you are unable to keep an appointment. What may interact with this medicine? -cyclosporine -erythromycin -ketoconazole -medicines to increase blood counts like filgrastim, pegfilgrastim, sargramostim -vaccines Talk to your doctor or health care professional before taking any of these medicines: -acetaminophen -aspirin -ibuprofen -ketoprofen -naproxen This list  may not describe all possible interactions. Give your health care provider a list of all the medicines, herbs, non-prescription drugs, or dietary supplements you use. Also tell them if you smoke, drink alcohol, or use illegal drugs. Some items may interact with your medicine. What should I watch for while using this medicine? Your condition will be monitored carefully while you are receiving this medicine. You will need important blood work done while you are taking this medicine. This drug may make you feel generally unwell. This is not uncommon, as chemotherapy can affect healthy cells as well as cancer cells. Report any side effects. Continue your course of treatment even though you feel ill unless your doctor tells you to stop. In some cases, you may be given additional medicines to help with side effects. Follow all directions for their use. Call your doctor or health care professional for advice if you get a fever, chills or sore throat, or other symptoms of a cold or flu. Do not treat yourself. This drug decreases your body's ability to fight infections. Try to avoid being around people who are sick. This medicine may increase your risk to bruise or bleed. Call your doctor or health care professional if you notice any unusual bleeding. This medicine may contain alcohol in the product. You may get drowsy or dizzy. Do not drive, use machinery, or do anything that needs mental alertness until you know how this medicine affects you. Do not stand or sit up quickly, especially if you are an older patient. This reduces the risk of dizzy or fainting spells. Avoid alcoholic drinks. Do not become pregnant while taking this medicine. Women should inform their doctor if they wish to become pregnant or   think they might be pregnant. There is a potential for serious side effects to an unborn child. Talk to your health care professional or pharmacist for more information. Do not breast-feed an infant while taking  this medicine. What side effects may I notice from receiving this medicine? Side effects that you should report to your doctor or health care professional as soon as possible: -allergic reactions like skin rash, itching or hives, swelling of the face, lips, or tongue -low blood counts - This drug may decrease the number of white blood cells, red blood cells and platelets. You may be at increased risk for infections and bleeding. -signs of infection - fever or chills, cough, sore throat, pain or difficulty passing urine -signs of decreased platelets or bleeding - bruising, pinpoint red spots on the skin, black, tarry stools, nosebleeds -signs of decreased red blood cells - unusually weak or tired, fainting spells, lightheadedness -breathing problems -fast or irregular heartbeat -low blood pressure -mouth sores -nausea and vomiting -pain, swelling, redness or irritation at the injection site -pain, tingling, numbness in the hands or feet -swelling of the ankle, feet, hands -weight gain Side effects that usually do not require medical attention (report to your doctor or health care professional if they continue or are bothersome): -bone pain -complete hair loss including hair on your head, underarms, pubic hair, eyebrows, and eyelashes -diarrhea -excessive tearing -changes in the color of fingernails -loosening of the fingernails -nausea -muscle pain -red flush to skin -sweating -weak or tired This list may not describe all possible side effects. Call your doctor for medical advice about side effects. You may report side effects to FDA at 1-800-FDA-1088. Where should I keep my medicine? This drug is given in a hospital or clinic and will not be stored at home. NOTE: This sheet is a summary. It may not cover all possible information. If you have questions about this medicine, talk to your doctor, pharmacist, or health care provider.  2018 Elsevier/Gold Standard (2015-06-18 12:32:56)

## 2017-06-16 NOTE — Progress Notes (Signed)
Patient here today as new evaluation regarding prostate cancer.  Referred by Dr. Pilar Jarvis.  Patient had bilateral nephrostomy tubes placed on Wednesday.  He had gone into renal failure and had to have dialysis while in hospital.

## 2017-06-16 NOTE — Progress Notes (Signed)
[facility] day:  06/16/2017  Chief Complaint: Michael Small is a 62 y.o. male with prostate cancer who is referred in consultation by Dr. Baruch Gouty for assessment and management.  HPI:   He notes a 6-8 month history of problem with his bowels.  His wife describes him having diarrhea constantly up to 12 bowel movement/day.  Stool was soft to watery.  Diarrhea was associated with cramps and weight loss.  He was seen by Dr. [name] on 04/17/2017.  Blood work and CT scans were ordered.  PSA was 2338.8 on 04/24/2017.  Abdomen and pelvic CT scan on 04/27/2017 revealed prominent pelvic adenopathy with pathologic retroperitoneal adenopathy, abnormal retroperitoneal stranding, and abnormal inflammatory type stranding along the pelvic sidewalls and perirectal space.  A representative right external iliac lymph node measured 3.2 cm.  A left external iliac lymph node measured 2.1 cm.  There was conglomerate left periaortic adenopathy 2.1 cm and a 1.4 cm aortocaval node.   There was abnormal adenopathy in the perirectal space with prominent wall thickening in the rectum and moderate wall thickening in the sigmoid colon. The findings along the pelvic sidewalls appeared to be causing distal ureteral obstruction resulting in moderate bilateral hydronephrosis and hydroureter. Lymphoma or prostate cancer was favored. A right external iliac node measured 3.2 cm.  There was bladder wall thickening suggesting cystitis.  There was prostatomegaly.  He was seen by Dr. Pilar Jarvis in consultation on 05/04/2017.  He was noted to have a 25 pound weight loss over the past year.  Creatinine had increased from 1.5 to 2.95.  Prostate biopsy and urinary diversion were discussed.  He underwent transrectal prostate biopsies on 05/24/2017 and cystoscopy with placement of a right ureteral stent on 05/24/2017 by Dr. John Giovanni.  The left ureteral orifice could not be identified at the  time of the procedure.  Prostate biopsies revealed acinar adenocarcinoma involving all 12 cores.  Adenocarcinoma involved colonic mucosa and skeletal muscle.  Gleason score was 9 (4+5), grade group 5.  He was seen in follow-up by Dr. Pilar Jarvis on 06/01/2017.  He had lost an additional 11 pounds.  He received Lupron 22.5 mg.  He was scheduled for a bone scan.  He was scheduled for left percutaneous nephrostomy tube in interventional radiology on 06/12/2017, but the procedure postponed secondary to hyperkalemia and renal failure.    He was admitted to [facility] from 06/12/2017 - 06/15/2017 with hyperkalemia and acute renal failure.  Potassium was 7.2.  BUN was 92 with a creatinine 7.23.  Renal ultrasound on 06/12/2017 revealed moderate bilateral hydronephrosis.  There was a thickened posterior bladder wall spanning 6.4 x 2.6.9 cm.  He was seen by nephrology and underwent emergent dialysis x1.  He was seen by Dr. John Giovanni on 06/13/2017.  Casodex 50 mg was started.  Testosterone level was 98 on 06/14/2017.  He received Degarelix 240 mg on 06/15/2017.  Casodex was discontinued on 06/15/2017.  He underwent bilateral nephrostomy tube placement on 06/14/2017.  BUN was 80 and creatinine 5.52 on 06/15/2017.  Bilateral lower extremity duplex on 06/12/2017 revealed no evidence of DVT.  CXR on 06/13/2017 revealed fullness of the right hilar region, underlying mass not excluded. CT chest could be obtained for further delineation.  There was biapical parenchymal changes similar to prior examination.  Chest CT on 11/29/2016 recommended six-month follow-up to confirm stability of nodular density within the right lung apex.  His diet is "not much".  He drinks  2 Ensure/day.  He has no appetite.  He is afraid to eat.  He denies any melena or hematochezia.  Symptomatically, he is not eating much as eating "makes me sick" and results in diarrhea.  He notes "not much energy".  He describes sitting around and watching TV and  sleeping.  He is able to perform his ADLs.  He denies any pain.   Past Medical History:  Diagnosis Date  . Depression   . Hyperlipidemia   . Hypertension   . Myocardial infarction [facility])    s/p stent  . Prostate cancer [facility])     Past Surgical History:  Procedure Laterality Date  . CORONARY ANGIOPLASTY WITH STENT PLACEMENT  12/2011   [facility]  . CYSTOSCOPY W/ RETROGRADES Bilateral 05/24/2017   Procedure: CYSTOSCOPY WITH RETROGRADE PYELOGRAM;  Surgeon: [name], MD;  Location: [facility] ORS;  Service: Urology;  Laterality: Bilateral;  . CYSTOSCOPY WITH STENT PLACEMENT Right 05/24/2017   Procedure: CYSTOSCOPY WITH STENT PLACEMENT;  Surgeon: [name], MD;  Location: [facility] ORS;  Service: Urology;  Laterality: Right;  . IR NEPHROSTOMY PLACEMENT LEFT  06/15/2017  . IR NEPHROSTOMY PLACEMENT RIGHT  06/14/2017  . PROSTATE BIOPSY N/A 05/24/2017   Procedure: PROSTATE BIOPSY;  Surgeon: [name], MD;  Location: [facility] ORS;  Service: Urology;  Laterality: N/A;  . US ECHOCARDIOGRAPHY  12/2011   Mild LV dysfunction, EF=45%    Family History  Problem Relation Age of Onset  . Cancer Mother   . Hypertension Mother   . Diabetes Father   . Heart disease Father   . Cancer Paternal Grandfather   . Prostate cancer Neg Hx   . Bladder Cancer Neg Hx   . Kidney cancer Neg Hx     Social History:  reports that he has been smoking cigars.  He has a 96.00 pack-year smoking history. he has never used smokeless tobacco. He reports that he does not drink alcohol or use drugs.  He has smoked 1 pack per day since age 38 (76 years).  He smokes small cigars.  He does not drink alcohol, but previously drank heavily.  He describes being exposed to Round Up and asbestos.  He has been disabled for several years secondary to "nerve damage and problems in his legs, arms, and bad back".  He fell off a ladder.  He previously worked for a [employer], [employer].  He lives in [place].  He has step  children.  The patient is accompanied by his wife today.  Allergies: No Known Allergies  Current Medications: Current Outpatient Medications  Medication Sig Dispense Refill  . amLODipine (NORVASC) 5 MG tablet TAKE 1 TABLET BY MOUTH EVERY DAY 30 tablet 5  . diphenoxylate-atropine (LOMOTIL) 2.5-0.025 MG tablet TAKE 1 TABLET BY MOUTH 2 (TWO) TIMES DAILY AS NEEDED FOR DIARRHEA OR LOOSE STOOLS. 30 tablet 1  . HYDROcodone-acetaminophen (NORCO/VICODIN) 5-325 MG tablet Take 1 tablet by mouth every 4 (four) hours as needed for moderate pain. 60 tablet 0  . tamsulosin (FLOMAX) 0.4 MG CAPS capsule Take 1 capsule (0.4 mg total) by mouth daily. 30 capsule 3   No current facility-administered medications for this visit.     Review of Systems:  GENERAL:  Fatigue.  Not much energy.  No fevers or sweats.  Baseline weight 190-200#.  Weight loss of 35-45# in 6 months. PERFORMANCE STATUS (ECOG):  2. HEENT:  Runny nose.  No visual changes, sore throat, mouth sores or tenderness. Lungs: No shortness of  breath or cough.  Little congestion.  No hemoptysis. Cardiac:  No chest pain, palpitations, orthopnea, or PND. h/o MI. GI:  "Eating makes me sick".  Diarrhea (see HPI).  Occasional nausea with water.  Novomiting, constipation, melena or hematochezia. GU:  No urgency, frequency, dysuria, or hematuria. Musculoskeletal:  No back pain.  Intermittent left shoulder discomfort.  Arthritis.  No muscle tenderness. Extremities:  No pain or swelling. Skin:  No rashes or skin changes. Neuro:  No headache, numbness or weakness, balance or coordination issues. Endocrine:  No diabetes, thyroid issues, or night sweats.  Hot flashes secondary to Lupron/Degarelix. Psych:  No mood changes, depression or anxiety. Pain:  Little soreness associated with stents. Review of systems:  All other systems reviewed and found to be negative.  Physical Exam: Blood pressure (!) 151/76, pulse (!) 102, temperature (!) 95.9 F (35.5 C),  temperature source Tympanic, resp. rate 20, height _0  (1.803 m), weight 155 lb 8 oz (70.5 kg). GENERAL:  Thin gentleman sitting comfortably in the exam room in no acute distress. MENTAL STATUS:  Alert and oriented to person, place and time. HEAD:   Alopecia.  Normocephalic, atraumatic, face symmetric, no Cushingoid features. EYES:  Blue eyes.  Light brown beard.  Pupils equal round and reactive to light and accomodation.  No conjunctivitis or scleral icterus. ENT:  Oropharynx clear without lesion.  Tongue normal.  No teeth.  Mucous membranes moist.  RESPIRATORY:  Clear to auscultation without rales, wheezes or rhonchi. CARDIOVASCULAR:  Regular rate and rhythm without murmur, rub or gallop. ABDOMEN:  Soft, slightly tender abdomen without guarding or rebound tenderness.  Active bowel sounds and no hepatosplenomegaly.  No masses. BACK:  Bilateral nephrostomy tubes. SKIN:  Two cysts posterior occiput.  No rashes, ulcers or lesions. EXTREMITIES: Right lower extremity edema.  No skin discoloration or tenderness.  No palpable cords. LYMPH NODES: No palpable cervical, supraclavicular, axillary or inguinal adenopathy  NEUROLOGICAL: Unremarkable. PSYCH:  Appropriate.   Admission on 06/12/2017, Discharged on 06/15/2017  Component Date Value Ref Range Status  . WBC 06/13/2017 5.5  3.8 - 10.6 K/uL Final  . RBC 06/13/2017 2.96* 4.40 - 5.90 MIL/uL Final  . Hemoglobin 06/13/2017 9.2* 13.0 - 18.0 g/dL Final  . HCT 06/13/2017 26.6* 40.0 - 52.0 % Final  . MCV 06/13/2017 90.0  80.0 - 100.0 fL Final  . MCH 06/13/2017 31.1  26.0 - 34.0 pg Final  . MCHC 06/13/2017 34.6  32.0 - 36.0 g/dL Final  . RDW 06/13/2017 12.8  11.5 - 14.5 % Final  . Platelets 06/13/2017 133* 150 - 440 K/uL Final   Performed at [facility], [address], [place], [place] [address]  . Sodium 06/13/2017 142  135 - 145 mmol/L Final  . Potassium 06/13/2017 4.5  3.5 - 5.1 mmol/L Final  . Chloride 06/13/2017 100* 101 - 111  mmol/L Final  . CO2 06/13/2017 23  22 - 32 mmol/L Final  . Glucose, Bld 06/13/2017 93  65 - 99 mg/dL Final  . BUN 06/13/2017 69* 6 - 20 mg/dL Final  . Creatinine, Ser 06/13/2017 5.37* 0.61 - 1.24 mg/dL Final  . Calcium 06/13/2017 8.8* 8.9 - 10.3 mg/dL Final  . Total Protein 06/13/2017 6.5  6.5 - 8.1 g/dL Final  . Albumin 06/13/2017 3.0* 3.5 - 5.0 g/dL Final  . AST 06/13/2017 16  15 - 41 U/L Final  . ALT 06/13/2017 10* 17 - 63 U/L Final  . Alkaline Phosphatase 06/13/2017 83  38 - 126 U/L  Final  . Total Bilirubin 06/13/2017 1.1  0.3 - 1.2 mg/dL Final  . GFR calc non Af Amer 06/13/2017 10* >60 mL/min Final  . GFR calc Af Amer 06/13/2017 12* >60 mL/min Final   Comment: (NOTE) The eGFR has been calculated using the CKD EPI equation. This calculation has not been validated in all clinical situations. eGFR's persistently <60 mL/min signify possible Chronic Kidney Disease.   Georgiann Hahn gap 06/13/2017 19* 5 - 15 Final   Performed at [facility], [address] [place], [place] [address]  . MRSA by PCR 06/12/2017 NEGATIVE  NEGATIVE Final   Comment:        The GeneXpert MRSA Assay (FDA approved for NASAL specimens only), is one component of a comprehensive MRSA colonization surveillance program. It is not intended to diagnose MRSA infection nor to guide or monitor treatment for MRSA infections. Performed at [facility], [address], [place], [place] [address]   . Glucose-Capillary 06/12/2017 75  65 - 99 mg/dL Final  . Hepatitis B Surface Ag 06/12/2017 Negative  Negative Final   Comment: (NOTE) Performed At: [facility] [place], [place] [id number] Rush Farmer MD EH:[id number] Performed at [facility], [address] [place], [place] [address]   . Hep B S Ab 06/12/2017 Non Reactive   Final   Comment: (NOTE)              Non Reactive: Inconsistent with immunity,                            less than 10 mIU/mL               Reactive:     Consistent with immunity,                            greater than 9.9 mIU/mL Performed At: [facility] [place], [place] [id number] Rush Farmer MD NO:[id number] Performed at [facility], [address] [place], [place] [address]   . Hep B Core Total Ab 06/12/2017 Negative  Negative Final   Comment: (NOTE) Performed At: [facility] [place], [place] [id number] Rush Farmer MD YT:[id number] Performed at [facility], [address] [place], [place] [address]   . Potassium 06/12/2017 4.7  3.5 - 5.1 mmol/L Final   Performed at [facility], [address] [place], [place] [address]  . Sodium 06/14/2017 141  135 - 145 mmol/L Final  . Potassium 06/14/2017 4.0  3.5 - 5.1 mmol/L Final  . Chloride 06/14/2017 95* 101 - 111 mmol/L Final  . CO2 06/14/2017 29  22 - 32 mmol/L Final  . Glucose, Bld 06/14/2017 98  65 - 99 mg/dL Final  . BUN 06/14/2017 74* 6 - 20 mg/dL Final  . Creatinine, Ser 06/14/2017 5.82* 0.61 - 1.24 mg/dL Final  . Calcium 06/14/2017 8.6* 8.9 - 10.3 mg/dL Final  . GFR calc non Af Amer 06/14/2017 9* >60 mL/min Final  . GFR calc Af Amer 06/14/2017 11* >60 mL/min Final   Comment: (NOTE) The eGFR has been calculated using the CKD EPI equation. This calculation has not been validated in all clinical situations. eGFR's persistently <60 mL/min signify possible Chronic Kidney Disease.   Georgiann Hahn gap 06/14/2017 17* 5 - 15 Final   Performed at [facility], [address] [place], [place] [address]  .  Testosterone 06/14/2017 98* 264 - 916 ng/dL Final   Comment: (NOTE) Adult male reference interval is based on a population of healthy nonobese males (BMI <30) between 76 and 32 years old. [place], [place] [phone]. PMID: [id number]. Performed At: [facility] [place], [place] [id number] Rush Farmer MD WE:[id number] Performed at  [facility], [address] [place], [place] [address]   . Prostatic Specific Antigen 06/14/2017 >3,000.00* 0.00 - 4.00 ng/mL Final   Comment: RESULTS CONFIRMED BY MANUAL DILUTION Performed at [facility], [address], [place], [place] [address]   . Sodium 06/15/2017 137  135 - 145 mmol/L Final  . Potassium 06/15/2017 3.7  3.5 - 5.1 mmol/L Final  . Chloride 06/15/2017 96* 101 - 111 mmol/L Final  . CO2 06/15/2017 27  22 - 32 mmol/L Final  . Glucose, Bld 06/15/2017 101* 65 - 99 mg/dL Final  . BUN 06/15/2017 80* 6 - 20 mg/dL Final  . Creatinine, Ser 06/15/2017 5.52* 0.61 - 1.24 mg/dL Final  . Calcium 06/15/2017 8.4* 8.9 - 10.3 mg/dL Final  . GFR calc non Af Amer 06/15/2017 10* >60 mL/min Final  . GFR calc Af Amer 06/15/2017 12* >60 mL/min Final   Comment: (NOTE) The eGFR has been calculated using the CKD EPI equation. This calculation has not been validated in all clinical situations. eGFR's persistently <60 mL/min signify possible Chronic Kidney Disease.   Georgiann Hahn gap 06/15/2017 14  5 - 15 Final   Performed at [facility], [address] [place], [place] [address]    Assessment:  Michael Small is a 62 y.o. male with imaging suggestive of metastatic prostate cancer s/p transrectal prostate biopsies and cystoscopy on 05/24/2017.  Prostate biopsies revealed acinar adenocarcinoma involving all 12 cores.  Adenocarcinoma involved colonic mucosa and skeletal muscle.  Gleason score was 9 (4+5), grade group 5.  PSA was 2338.8 on 04/24/2017.  Abdomen and pelvic CT on 04/27/2017 revealed prominent pelvic adenopathy with pathologic retroperitoneal adenopathy, abnormal retroperitoneal stranding, and abnormal inflammatory type stranding along the pelvic sidewalls and perirectal space.  A representative right external iliac lymph node measured 3.2 cm.  A left external iliac lymph node measured 2.1 cm.  There was conglomerate left periaortic adenopathy 2.1 cm and a 1.4  cm aortocaval node.   There was abnormal adenopathy in the perirectal space with prominent wall thickening in the rectum and moderate wall thickening in the sigmoid colon. The findings along the pelvic sidewalls appeared to be causing distal ureteral obstruction resulting in moderate bilateral hydronephrosis and hydroureter. Lymphoma or prostate cancer was favored. A right external iliac node measured 3.2 cm.  There was bladder wall thickening suggesting cystitis.  There was prostatomegaly.  He received Lupron 22.5 mg on 06/01/2017 and Degarelix 240 mg on 06/15/2017.  He received Casodex from 06/13/2017 - 06/15/2017.  Testosterone was 98 on 06/14/2017.  He was admitted to [facility] from 06/12/2017 - 06/15/2017 with hyperkalemia and acute renal failure.  Creatinine was 7.23.  He underwent emergent dialysis x1. He is s/p bilateral external nephrostomy tube placement on 06/14/2017.  Bilateral lower extremity duplex on 06/12/2017 revealed no evidence of DVT.   Symptomatically, he feels fatigued.  He has lost 35-45 pounds in the past 6 months.  He is afraid to eat.  He has chronic diarrhea.  Plan: 1.  Discuss diagnosis of advanced prostate cancer.  Discuss plans for additional studies (chest CT and bone scan).  Discuss treatment.  He has received  received hormonal therapy (Lupron and Degarelix).  Discuss benefit of added chemotherapy (Taxotere).  Potential side effects reviewed.  Discuss concern for general debilitated condition with inability to eat and ongoing issues with diarrhea.  Discuss referral to Nutrition and GI consult.  Etiology of diarrhea unclear.  Pathology did reveal evidence of colon involvement. 2.  Contact Dr. John Giovanni re: thickening of bladder noted on imaging and irregular appearing tissue on the left hemitrigone with hypervascularity noted on cystoscopy. 3.  GI consult- chronic diarrhea and weight loss 4.  Nutrition consult.  Discuss caloric intake. 5.  Chest CT no contrast. 6.   Anticipate bone scan. 7.  Follow-up with Dr Candiss Norse (nephrology) on 06/19/2017. 8.  RTC in 1 week for MD assessment.   Lequita Asal, MD  06/16/2017, 5:35 PM

## 2017-06-16 NOTE — Progress Notes (Signed)
START ON PATHWAY REGIMEN - Prostate   Docetaxel 75 mg/m2:   A cycle is every 21 days:     Docetaxel   **Always confirm dose/[facility] + Bicalutamide:   A cycle is every 12 weeks:     Leuprolide acetate depot    Daily:     Bicalutamide   **Always confirm dose/schedule in your pharmacy ordering system**    Patient Characteristics: Adenocarcinoma, Metastatic, Hormone Naive, High Volume Disease* Current radiographic evidence of distant metastasis<= Yes Histology: Adenocarcinoma AJCC T Category: cTX Gleason Primary: X AJCC N Category: N1 Gleason Secondary: X AJCC M Category: M1a Gleason Score: X AJCC 8 Stage Grouping: IVB PSA Values (ng/mL): X Would you be surprised if this patient died  in the next year<= I would NOT be surprised if this patient died in the next year Intent of Therapy: Non-Curative / Palliative Intent, Discussed with Patient

## 2017-06-17 ENCOUNTER — Encounter: Payer: Self-pay | Admitting: Hematology and Oncology

## 2017-06-17 DIAGNOSIS — N289 Disorder of kidney and ureter, unspecified: Secondary | ICD-10-CM | POA: Insufficient documentation

## 2017-06-17 DIAGNOSIS — R634 Abnormal weight loss: Secondary | ICD-10-CM | POA: Insufficient documentation

## 2017-06-19 ENCOUNTER — Ambulatory Visit (INDEPENDENT_AMBULATORY_CARE_PROVIDER_SITE_OTHER): Payer: Medicare Other | Admitting: Family Medicine

## 2017-06-19 VITALS — BP 110/60 | HR 93 | Temp 98.0°F | Resp 16 | Wt 155.0 lb

## 2017-06-19 DIAGNOSIS — C61 Malignant neoplasm of prostate: Secondary | ICD-10-CM

## 2017-06-19 DIAGNOSIS — N1330 Unspecified hydronephrosis: Secondary | ICD-10-CM | POA: Diagnosis not present

## 2017-06-19 DIAGNOSIS — N179 Acute kidney failure, unspecified: Secondary | ICD-10-CM | POA: Diagnosis not present

## 2017-06-19 DIAGNOSIS — R109 Unspecified abdominal pain: Secondary | ICD-10-CM | POA: Diagnosis not present

## 2017-06-19 DIAGNOSIS — K529 Noninfective gastroenteritis and colitis, unspecified: Secondary | ICD-10-CM

## 2017-06-19 MED ORDER — DIPHENOXYLATE-ATROPINE 2.5-0.025 MG PO TABS: 1.0000 | ORAL_TABLET | Freq: Two times a day (BID) | ORAL | 3 refills | Status: DC | PRN

## 2017-06-19 MED ORDER — HYDROCODONE-ACETAMINOPHEN 5-325 MG PO TABS: 1.0000 | ORAL_TABLET | ORAL | 0 refills | Status: DC | PRN

## 2017-06-19 NOTE — Progress Notes (Signed)
14      Patient: Michael Small Male    DOB: March 05, 1956   62 y.o.   MRN: [id number] Visit Date: 06/19/2017  Today's Provider: [name], MD   No chief complaint on file.  Subjective:    HPI   Follow up Hospitalization  Patient was admitted to [facility] on 06/12/2017 and discharged on 06/15/2017. He was treated for; swelling and acute renal failure                Treatment for this included; Patient got bilateral  nephrostomy tubes  He had follow up with Dr. Eual Fines 06/16/2017 and is consideration starting chemo for prostate cancer.  Was referred to nutritionist who he states is going to seem this week Referral to GI was initiated for chronic diarrhea. He was prescribed lomotil by me a few months ago which he states is working well, he usually take just one every day He has follow up with Dr. Candiss Norse scheduled this afternoon for follow up renal failure.   He continues to have constant abdominal pain. He states hydrocodone/apap has been effective, is taking 2 tablets twice a day most days.  Telephone follow up was done on 06/16/2017 He reports good compliance with treatment. He reports this condition is Improved.  ----------------------------------------------------------------   Patient states he feels much improved since discharge.   No Known Allergies   Current Outpatient Medications:  .  amLODipine (NORVASC) 5 MG tablet, TAKE 1 TABLET BY MOUTH EVERY DAY, Disp: 30 tablet, Rfl: 5 .  diphenoxylate-atropine (LOMOTIL) 2.5-0.025 MG tablet, TAKE 1 TABLET BY MOUTH 2 (TWO) TIMES DAILY AS NEEDED FOR DIARRHEA OR LOOSE STOOLS., Disp: 30 tablet, Rfl: 1 .  HYDROcodone-acetaminophen (NORCO/VICODIN) 5-325 MG tablet, Take 1 tablet by mouth every 4 (four) hours as needed for moderate pain., Disp: 60 tablet, Rfl: 0 .  tamsulosin (FLOMAX) 0.4 MG CAPS capsule, Take 1 capsule (0.4 mg total) by mouth daily., Disp: 30 capsule, Rfl: 3  Review of Systems  Constitutional: Negative for appetite change,  chills and fever.  Respiratory: Negative for chest tightness, shortness of breath and wheezing.   Cardiovascular: Negative for chest pain and palpitations.  Gastrointestinal: Negative for abdominal pain, nausea and vomiting.    Social History   Tobacco Use  . Smoking status: Current Every Day Smoker    Packs/day: 2.00    Years: 48.00    Pack years: 96.00    Types: Cigars  . Smokeless tobacco: Never Used  . Tobacco comment: used to amoke 2PPD- lately cut down to 1/2 PPD  Substance Use Topics  . 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  Eyes:    PERRL, conjunctiva/corneas clear, EOM's intact       Lungs:     Clear to auscultation bilaterally, respirations unlabored  Heart:    Regular rate and rhythm             Assessment & Plan:     1. Acute kidney injury ([place]) Improved by the time of discharge,  follow up nephrology this afternoon as scheduled.   2. Chronic diarrhea refill- diphenoxylate-atropine (LOMOTIL) 2.5-0.025 MG tablet; Take 1 tablet by mouth 2 (two) times daily as needed for diarrhea or loose stools.  Dispense: 30 tablet; Refill: 3  Referral to GI per oncology is in progress.   3. Prostate cancer ([place])   4. Abdominal pain, unspecified abdominal location Fairly well controlled current pain medications.  - HYDROcodone-acetaminophen (NORCO/VICODIN) 5-325 MG tablet; Take 1 tablet by mouth every 4 (four) hours as needed for moderate pain.  Dispense: 120 tablet; Refill: 0       [name], MD  [facility]

## 2017-06-20 ENCOUNTER — Encounter: Payer: Self-pay | Admitting: Family Medicine

## 2017-06-20 NOTE — Telephone Encounter (Signed)
NHA out of the the office yesterday, 06/19/17. Pt had his hospital f/u with PCP yesterday. TCM call no longer needed. Closing encounter.  -MM

## 2017-06-22 ENCOUNTER — Encounter
Admission: RE | Admit: 2017-06-22 | Discharge: 2017-06-22 | Disposition: A | Discharge status: Home / Self Care | Payer: Medicare Other | Source: Ambulatory Visit | Attending: Urology | Admitting: Urology

## 2017-06-22 DIAGNOSIS — C61 Malignant neoplasm of prostate: Secondary | ICD-10-CM | POA: Diagnosis not present

## 2017-06-22 MED ORDER — TECHNETIUM TC 99M MEDRONATE IV KIT
23.1200 | PACK | Freq: Once | INTRAVENOUS | Status: AC | PRN
  Administered 2017-06-22: 23.12 via INTRAVENOUS

## 2017-06-23 ENCOUNTER — Ambulatory Visit: Payer: Medicare Other

## 2017-06-23 ENCOUNTER — Encounter: Payer: Self-pay | Admitting: Urology

## 2017-06-23 ENCOUNTER — Ambulatory Visit (INDEPENDENT_AMBULATORY_CARE_PROVIDER_SITE_OTHER): Payer: Medicare Other | Admitting: Urology

## 2017-06-23 VITALS — BP 147/77 | HR 114 | Ht 71.0 in | Wt 150.6 lb

## 2017-06-23 DIAGNOSIS — C61 Malignant neoplasm of prostate: Secondary | ICD-10-CM | POA: Diagnosis not present

## 2017-06-23 DIAGNOSIS — C7951 Secondary malignant neoplasm of bone: Secondary | ICD-10-CM

## 2017-06-23 NOTE — Progress Notes (Signed)
06/23/2017 10:37 AM   Dan Maker 31-May-1955 [id number]  Referring provider: Birdie Sons, [address], [place] [address]  Chief Complaint  Patient presents with  . Follow-up    HPI: 62 year old male presents for prostate cancer follow-up.  His PSA was greater than 2000 and he had bilateral ureteral obstruction.  Biopsy showed invasion of the colonic mucosa.  He had placement of a right ureteral stent on 12/26.  The left ureteral orifice could not be identified.  He did receive Lupron and was scheduled for placement of a left percutaneous nephrostomy last week however was noted to have hyperkalemia and significant creatinine elevation.  He was hospitalized and underwent temporary hemodialysis.  He had bilateral percutaneous nephrostomies placed.  He did receive a dose of diuretics while hospitalized.  He states his nephrostomy tubes are draining well.  His bone scan was performed yesterday which showed diffuse metastatic disease.   PMH: Past Medical History:  Diagnosis Date  . Depression   . Hyperlipidemia   . Hypertension   . Myocardial infarction [facility])    s/p stent  . Prostate cancer [facility])     Surgical History: Past Surgical History:  Procedure Laterality Date  . CORONARY ANGIOPLASTY WITH STENT PLACEMENT  12/2011   DES LAD [facility] Cardiology  . CYSTOSCOPY W/ RETROGRADES Bilateral 05/24/2017   Procedure: CYSTOSCOPY WITH RETROGRADE PYELOGRAM;  Surgeon: [name], MD;  Location: [facility] ORS;  Service: Urology;  Laterality: Bilateral;  . CYSTOSCOPY WITH STENT PLACEMENT Right 05/24/2017   Procedure: CYSTOSCOPY WITH STENT PLACEMENT;  Surgeon: [name], MD;  Location: [facility] ORS;  Service: Urology;  Laterality: Right;  . IR NEPHROSTOMY PLACEMENT LEFT  06/15/2017  . IR NEPHROSTOMY PLACEMENT RIGHT  06/14/2017  . PROSTATE BIOPSY N/A 05/24/2017   Procedure: PROSTATE BIOPSY;  Surgeon: [name], MD;  Location: [facility] ORS;  Service: Urology;   Laterality: N/A;  . US ECHOCARDIOGRAPHY  12/2011   Mild LV dysfunction, EF=45%    Home Medications:  Allergies as of 06/23/2017   No Known Allergies     Medication List        Accurate as of 06/23/17 10:37 AM. Always use your most recent med list.          amLODipine 5 MG tablet Commonly known as:  NORVASC TAKE 1 TABLET BY MOUTH EVERY DAY   diphenoxylate-atropine 2.5-0.025 MG tablet Commonly known as:  LOMOTIL Take 1 tablet by mouth 2 (two) times daily as needed for diarrhea or loose stools.   HYDROcodone-acetaminophen 5-325 MG tablet Commonly known as:  NORCO/VICODIN Take 1 tablet by mouth every 4 (four) hours as needed for moderate pain.   tamsulosin 0.4 MG Caps capsule Commonly known as:  FLOMAX Take 1 capsule (0.4 mg total) by mouth daily.       Allergies: No Known Allergies  Family History: Family History  Problem Relation Age of Onset  . Cancer Mother   . Hypertension Mother   . Diabetes Father   . Heart disease Father   . Cancer Paternal Grandfather   . Prostate cancer Neg Hx   . Bladder Cancer Neg Hx   . Kidney cancer Neg Hx     Social History:  reports that he has been smoking cigars.  He has a 96.00 pack-year smoking history. he has never used smokeless tobacco. He reports that he does not drink alcohol or use drugs.  ROS: UROLOGY Frequent Urination?: No Hard to postpone urination?: No Burning/pain  with urination?: No Get up at night to urinate?: No Leakage of urine?: No Urine stream starts and stops?: No Trouble starting stream?: No Do you have to strain to urinate?: No Blood in urine?: No Urinary tract infection?: No Sexually transmitted disease?: No Injury to kidneys or bladder?: No Painful intercourse?: No Weak stream?: No Erection problems?: No Penile pain?: No  Gastrointestinal Nausea?: No Vomiting?: No Indigestion/heartburn?: No Diarrhea?: No Constipation?: No  Constitutional Fever: No Night sweats?: No Weight loss?:  Yes Fatigue?: No  Skin Skin rash/lesions?: No Itching?: No  Eyes Blurred vision?: No Double vision?: No  Ears/Nose/Throat Sore throat?: No Sinus problems?: Yes  Hematologic/Lymphatic Swollen glands?: No Easy bruising?: No  Cardiovascular Leg swelling?: Yes Chest pain?: No  Respiratory Cough?: No Shortness of breath?: No  Endocrine Excessive thirst?: No  Musculoskeletal Back pain?: No Joint pain?: No  Neurological Headaches?: No Dizziness?: No  Psychologic Depression?: No Anxiety?: No  Physical Exam: BP (!) 147/77 (BP Location: Left Arm, Patient Position: Sitting, Cuff Size: Normal)   Pulse (!) 114   Ht 5\' 11"  (1.803 m)   Wt 150 lb 9.6 oz (68.3 kg)   BMI 21.00 kg/m   Constitutional:  Alert and oriented, No acute distress. HEENT: [place] AT, moist mucus membranes.  Trachea midline, no masses. Cardiovascular: No clubbing, cyanosis, or edema. Respiratory: Normal respiratory effort, no increased work of breathing. GI: Abdomen is soft, nontender, nondistended, no abdominal masses GU: No CVA tenderness.  Skin: No rashes, bruises or suspicious lesions. Lymph: No cervical or inguinal adenopathy. Neurologic: Grossly intact, no focal deficits, moving all 4 extremities. Psychiatric: Normal mood and affect.  Laboratory Data: Lab Results  Component Value Date   WBC 5.5 06/13/2017   HGB 9.2 (L) 06/13/2017   HCT 26.6 (L) 06/13/2017   MCV 90.0 06/13/2017   PLT 133 (L) 06/13/2017    Lab Results  Component Value Date   CREATININE 5.52 (H) 06/15/2017      Lab Results  Component Value Date   TESTOSTERONE 98 (L) 06/14/2017    Lab Results  Component Value Date   HGBA1C 5.8 (H) 11/15/2016     Pertinent Imaging: Bone scan consistent with diffuse metastatic disease of the ribs and entire spine.  Assessment & Plan:    1. Prostate cancer [facility]) Widely metastatic prostate cancer with local invasion and bilateral ureteral obstruction.  PSA, testosterone and  creatinine drawn today.   1.  Schedule bilateral nephrostograms approximately 2 weeks.  Hopefully once castrate testosterone levels are achieved he will have disease progression that his nephrostomies may be removed or at least his left nephrostomy can be converted to an internal stent.  2.  Will go ahead and get a oncology referral  - PSA - Testosterone - BUN+Creat    [name], MD  [facility] [address] [place], [place] [address] [phone]

## 2017-06-24 LAB — BUN+CREAT
BUN/Creatinine Ratio: 16 (ref 10–24)
BUN: 35 mg/dL — AB (ref 8–27)
Creatinine, Ser: 2.18 mg/dL — ABNORMAL HIGH (ref 0.76–1.27)
GFR calc Af Amer: 36 mL/min/{1.73_m2} — ABNORMAL LOW (ref >59)
GFR calc non Af Amer: 31 mL/min/{1.73_m2} — ABNORMAL LOW (ref >59)

## 2017-06-24 LAB — PSA: Prostate Specific Ag, Serum: 3603 ng/mL — ABNORMAL HIGH (ref 0.0–4.0)

## 2017-06-24 LAB — TESTOSTERONE

## 2017-06-26 ENCOUNTER — Inpatient Hospital Stay: Payer: Medicare Other | Admitting: Hematology and Oncology

## 2017-06-26 ENCOUNTER — Telehealth: Payer: Self-pay | Admitting: *Deleted

## 2017-06-26 ENCOUNTER — Inpatient Hospital Stay: Payer: Medicare Other

## 2017-06-26 NOTE — Progress Notes (Signed)
Nutrition  Patient nutrition appointment rescheduled from today to 1/31 as seeing MD on that day as well.    Aybree Lanyon B. Zenia Resides, [place], [place] Registered Dietitian [phone] (pager)

## 2017-06-26 NOTE — Progress Notes (Deleted)
[facility] day:  06/26/2017   Chief Complaint: Michael Small is a 62 y.o. male with prostate cancer who is seen for review of interval evaluations.  HPI:   The patient was last seen in the medical oncology clinic on 06/16/2017.  At that time, he felt fatigued.  He had lost 35-45 pounds in the past 6 months.  He was afraid to eat.  He had chronic diarrhea.  Bone scan on 06/22/2017 revealed findings consistent with diffuse metastatic disease.  There was activity in right temporal area, sternum, anterior and posterior ribs bilaterally, and entire spine.  GI was consulted regarding his chronic diarrhea.  He was to follow-up with Dr. Candiss Norse, nephrology, on 06/19/2017.  He was seen by Dr. Bernardo Heater on 06/23/2017.  He is scheduled for bilateral nephrostograms in 2 weeks.  He has a nutrition consult today.  He is scheduled for non-contrast chest CT tomorrow.  During the interim,   Past Medical History:  Diagnosis Date  . Depression   . Hyperlipidemia   . Hypertension   . Myocardial infarction [facility])    s/p stent  . Prostate cancer [facility])     Past Surgical History:  Procedure Laterality Date  . CORONARY ANGIOPLASTY WITH STENT PLACEMENT  12/2011   DES LAD [facility] Cardiology  . CYSTOSCOPY W/ RETROGRADES Bilateral 05/24/2017   Procedure: CYSTOSCOPY WITH RETROGRADE PYELOGRAM;  Surgeon: [name], MD;  Location: [facility] ORS;  Service: Urology;  Laterality: Bilateral;  . CYSTOSCOPY WITH STENT PLACEMENT Right 05/24/2017   Procedure: CYSTOSCOPY WITH STENT PLACEMENT;  Surgeon: [name], MD;  Location: [facility] ORS;  Service: Urology;  Laterality: Right;  . IR NEPHROSTOMY PLACEMENT LEFT  06/15/2017  . IR NEPHROSTOMY PLACEMENT RIGHT  06/14/2017  . PROSTATE BIOPSY N/A 05/24/2017   Procedure: PROSTATE BIOPSY;  Surgeon: [name], MD;  Location: [facility] ORS;  Service: Urology;  Laterality: N/A;  . US ECHOCARDIOGRAPHY  12/2011   Mild LV dysfunction,  EF=45%    Family History  Problem Relation Age of Onset  . Cancer Mother   . Hypertension Mother   . Diabetes Father   . Heart disease Father   . Cancer Paternal Grandfather   . Prostate cancer Neg Hx   . Bladder Cancer Neg Hx   . Kidney cancer Neg Hx     Social History:  reports that he has been smoking cigars.  He has a 96.00 pack-year smoking history. he has never used smokeless tobacco. He reports that he does not drink alcohol or use drugs.  He has smoked 1 pack per day since age 75 (30 years).  He smokes small cigars.  He does not drink alcohol, but previously drank heavily.  He describes being exposed to Round Up and asbestos.  He has been disabled for several years secondary to "nerve damage and problems in his legs, arms, and bad back".  He fell off a ladder.  He previously worked for a [employer], [employer].  He lives in [place].  He has step children.  The patient is accompanied by his wife today.  Allergies: No Known Allergies  Current Medications: Current Outpatient Medications  Medication Sig Dispense Refill  . amLODipine (NORVASC) 5 MG tablet TAKE 1 TABLET BY MOUTH EVERY DAY 30 tablet 5  . diphenoxylate-atropine (LOMOTIL) 2.5-0.025 MG tablet Take 1 tablet by mouth 2 (two) times daily as needed for diarrhea or loose stools. 30 tablet 3  . HYDROcodone-acetaminophen (NORCO/VICODIN) 5-325 MG tablet  Take 1 tablet by mouth every 4 (four) hours as needed for moderate pain. 120 tablet 0  . tamsulosin (FLOMAX) 0.4 MG CAPS capsule Take 1 capsule (0.4 mg total) by mouth daily. 30 capsule 3   No current facility-administered medications for this visit.     Review of Systems:  GENERAL:  Fatigue.  Not much energy.  No fevers or sweats.  Baseline weight 190-200#.  Weight loss of 35-45# in 6 months. PERFORMANCE STATUS (ECOG):  2. HEENT:  Runny nose.  No visual changes, sore throat, mouth sores or tenderness. Lungs: No shortness of breath or cough.  Little congestion.  No  hemoptysis. Cardiac:  No chest pain, palpitations, orthopnea, or PND. h/o MI. GI:  "Eating makes me sick".  Diarrhea (see HPI).  Occasional nausea with water.  Novomiting, constipation, melena or hematochezia. GU:  No urgency, frequency, dysuria, or hematuria. Musculoskeletal:  No back pain.  Intermittent left shoulder discomfort.  Arthritis.  No muscle tenderness. Extremities:  No pain or swelling. Skin:  No rashes or skin changes. Neuro:  No headache, numbness or weakness, balance or coordination issues. Endocrine:  No diabetes, thyroid issues, or night sweats.  Hot flashes secondary to Lupron/Degarelix. Psych:  No mood changes, depression or anxiety. Pain:  Little soreness associated with stents. Review of systems:  All other systems reviewed and found to be negative.  Physical Exam: There were no vitals taken for this visit. GENERAL:  Thin gentleman sitting comfortably in the exam room in no acute distress. MENTAL STATUS:  Alert and oriented to person, place and time. HEAD:   Alopecia.  Normocephalic, atraumatic, face symmetric, no Cushingoid features. EYES:  Blue eyes.  Light brown beard.  Pupils equal round and reactive to light and accomodation.  No conjunctivitis or scleral icterus. ENT:  Oropharynx clear without lesion.  Tongue normal.  No teeth.  Mucous membranes moist.  RESPIRATORY:  Clear to auscultation without rales, wheezes or rhonchi. CARDIOVASCULAR:  Regular rate and rhythm without murmur, rub or gallop. ABDOMEN:  Soft, slightly tender abdomen without guarding or rebound tenderness.  Active bowel sounds and no hepatosplenomegaly.  No masses. BACK:  Bilateral nephrostomy tubes. SKIN:  Two cysts posterior occiput.  No rashes, ulcers or lesions. EXTREMITIES: Right lower extremity edema.  No skin discoloration or tenderness.  No palpable cords. LYMPH NODES: No palpable cervical, supraclavicular, axillary or inguinal adenopathy  NEUROLOGICAL: Unremarkable. PSYCH:   Appropriate.   No visits with results within 3 Day(s) from this visit.  Latest known visit with results is:  Office Visit on 06/23/2017  Component Date Value Ref Range Status  . Prostate Specific Ag, Serum 06/23/2017 3,603.0* 0.0 - 4.0 ng/mL Final   Comment: Results confirmed on dilution. Roche ECLIA methodology. According to the American Urological Association, Serum PSA should decrease and remain at undetectable levels after radical prostatectomy. The AUA defines biochemical recurrence as an initial PSA value 0.2 ng/mL or greater followed by a subsequent confirmatory PSA value 0.2 ng/mL or greater. Values obtained with different assay methods or kits cannot be used interchangeably. Results cannot be interpreted as absolute evidence of the presence or absence of malignant disease.   . Testosterone 06/23/2017 <3* 264 - 916 ng/dL Final   Comment: Adult male reference interval is based on a population of healthy nonobese males (BMI <30) between 59 and 11 years old. [place], [place] [phone]. PMID: [id number].   Marland Kitchen BUN 06/23/2017 35* 8 - 27 mg/dL Final   Specimen received hemolyzed. Clinical correlation indicated.  Marland Kitchen  Creatinine, Ser 06/23/2017 2.18* 0.76 - 1.27 mg/dL Final  . GFR calc non Af Amer 06/23/2017 31* >59 mL/min/1.73 Final  . GFR calc Af Amer 06/23/2017 36* >59 mL/min/1.73 Final  . BUN/Creatinine Ratio 06/23/2017 16  10 - 24 Final    Assessment:  Michael Small is a 62 y.o. male with imaging suggestive of metastatic prostate cancer s/p transrectal prostate biopsies and cystoscopy on 05/24/2017.  Prostate biopsies revealed acinar adenocarcinoma involving all 12 cores.  Adenocarcinoma involved colonic mucosa and skeletal muscle.  Gleason score was 9 (4+5), grade group 5.  PSA was 2338.8 on 04/24/2017.  Abdomen and pelvic CT on 04/27/2017 revealed prominent pelvic adenopathy with pathologic retroperitoneal adenopathy, abnormal retroperitoneal stranding, and abnormal  inflammatory type stranding along the pelvic sidewalls and perirectal space.  A representative right external iliac lymph node measured 3.2 cm.  A left external iliac lymph node measured 2.1 cm.  There was conglomerate left periaortic adenopathy 2.1 cm and a 1.4 cm aortocaval node.   There was abnormal adenopathy in the perirectal space with prominent wall thickening in the rectum and moderate wall thickening in the sigmoid colon. The findings along the pelvic sidewalls appeared to be causing distal ureteral obstruction resulting in moderate bilateral hydronephrosis and hydroureter. Lymphoma or prostate cancer was favored. A right external iliac node measured 3.2 cm.  There was bladder wall thickening suggesting cystitis.  There was prostatomegaly.  He received Lupron 22.5 mg on 06/01/2017 and Degarelix 240 mg on 06/15/2017.  He received Casodex from 06/13/2017 - 06/15/2017.  Testosterone was 98 on 06/14/2017.  He was admitted to [facility] from 06/12/2017 - 06/15/2017 with hyperkalemia and acute renal failure.  Creatinine was 7.23.  He underwent emergent dialysis x1. He is s/p bilateral external nephrostomy tube placement on 06/14/2017.  Bilateral lower extremity duplex on 06/12/2017 revealed no evidence of DVT.   Symptomatically, he feels fatigued.  He has lost 35-45 pounds in the past 6 months.  He is afraid to eat.  He has chronic diarrhea.  Plan: 1.  Discuss diagnosis of advanced prostate cancer.  Discuss plans for additional studies (chest CT and bone scan).  Discuss treatment.  He has received received hormonal therapy (Lupron and Degarelix).  Discuss benefit of added chemotherapy (Taxotere).  Potential side effects reviewed.  Discuss concern for general debilitated condition with inability to eat and ongoing issues with diarrhea.  Discuss referral to Nutrition and GI consult.  Etiology of diarrhea unclear.  Pathology did reveal evidence of colon involvement. 2.  Contact Dr. John Giovanni re: thickening  of bladder noted on imaging and irregular appearing tissue on the left hemitrigone with hypervascularity noted on cystoscopy. 3.  GI consult- chronic diarrhea and weight loss 4.  Nutrition consult.  Discuss caloric intake. 5.  Chest CT no contrast. 6.  Anticipate bone scan. 7.  Follow-up with Dr Candiss Norse (nephrology) on 06/19/2017. 8.  RTC in 1 week for MD assessment.   Lequita Asal, MD  06/26/2017, 5:41 AM  I saw and evaluated the patient, participating in the key portions of the service and reviewing pertinent diagnostic studies and records.  I reviewed the nurse practitioner's note and agree with the findings and the plan.  The assessment and plan were discussed with the patient.  Additional diagnostic studies of *** are needed to clarify *** and would change the clinical management.  A few ***multiple questions were asked by the patient and answered.   Nolon Stalls, MD 06/26/2017,5:41 AM

## 2017-06-26 NOTE — Telephone Encounter (Signed)
  Per MD request to see patient after his CT on 06/27/17. 06/26/17 MD appt was Rescheduled to 06/29/17 @11 :30 Patient is aware. Also a letter was mailed out.

## 2017-06-27 ENCOUNTER — Ambulatory Visit
Admission: RE | Admit: 2017-06-27 | Discharge: 2017-06-27 | Disposition: A | Discharge status: Home / Self Care | Payer: Medicare Other | Source: Ambulatory Visit | Attending: Hematology and Oncology | Admitting: Hematology and Oncology

## 2017-06-27 ENCOUNTER — Other Ambulatory Visit: Payer: Self-pay | Admitting: Urgent Care

## 2017-06-27 DIAGNOSIS — C61 Malignant neoplasm of prostate: Secondary | ICD-10-CM | POA: Insufficient documentation

## 2017-06-27 DIAGNOSIS — R590 Localized enlarged lymph nodes: Secondary | ICD-10-CM | POA: Diagnosis not present

## 2017-06-27 DIAGNOSIS — C7951 Secondary malignant neoplasm of bone: Secondary | ICD-10-CM | POA: Diagnosis not present

## 2017-06-27 DIAGNOSIS — D3502 Benign neoplasm of left adrenal gland: Secondary | ICD-10-CM | POA: Diagnosis not present

## 2017-06-27 DIAGNOSIS — J432 Centrilobular emphysema: Secondary | ICD-10-CM | POA: Diagnosis not present

## 2017-06-27 DIAGNOSIS — R911 Solitary pulmonary nodule: Secondary | ICD-10-CM | POA: Diagnosis not present

## 2017-06-27 DIAGNOSIS — I700 Atherosclerosis of aorta: Secondary | ICD-10-CM | POA: Insufficient documentation

## 2017-06-27 DIAGNOSIS — C3411 Malignant neoplasm of upper lobe, right bronchus or lung: Secondary | ICD-10-CM | POA: Diagnosis not present

## 2017-06-29 ENCOUNTER — Other Ambulatory Visit: Payer: Self-pay | Admitting: Hematology and Oncology

## 2017-06-29 ENCOUNTER — Inpatient Hospital Stay: Payer: Medicare Other

## 2017-06-29 ENCOUNTER — Other Ambulatory Visit: Payer: Self-pay

## 2017-06-29 ENCOUNTER — Inpatient Hospital Stay (HOSPITAL_BASED_OUTPATIENT_CLINIC_OR_DEPARTMENT_OTHER): Payer: Medicare Other | Admitting: Hematology and Oncology

## 2017-06-29 VITALS — BP 141/75 | HR 90 | Temp 95.7°F | Resp 18 | Wt 149.0 lb

## 2017-06-29 DIAGNOSIS — N179 Acute kidney failure, unspecified: Secondary | ICD-10-CM | POA: Diagnosis not present

## 2017-06-29 DIAGNOSIS — C61 Malignant neoplasm of prostate: Secondary | ICD-10-CM | POA: Diagnosis not present

## 2017-06-29 DIAGNOSIS — C7951 Secondary malignant neoplasm of bone: Secondary | ICD-10-CM | POA: Diagnosis not present

## 2017-06-29 DIAGNOSIS — R197 Diarrhea, unspecified: Secondary | ICD-10-CM

## 2017-06-29 DIAGNOSIS — I252 Old myocardial infarction: Secondary | ICD-10-CM | POA: Diagnosis not present

## 2017-06-29 DIAGNOSIS — E43 Unspecified severe protein-calorie malnutrition: Secondary | ICD-10-CM

## 2017-06-29 DIAGNOSIS — C7989 Secondary malignant neoplasm of other specified sites: Secondary | ICD-10-CM

## 2017-06-29 DIAGNOSIS — E875 Hyperkalemia: Secondary | ICD-10-CM

## 2017-06-29 DIAGNOSIS — Z7189 Other specified counseling: Secondary | ICD-10-CM | POA: Insufficient documentation

## 2017-06-29 NOTE — Progress Notes (Signed)
[facility] day:  06/29/2017   Chief Complaint: Michael Small is a 62 y.o. male with prostate cancer who is seen for review of interval imaging studies and discussion regarding direction of therapy.  HPI:   The patient was last seen in the medical oncology clinic on 06/16/2017 for initial consultation.  At that time, he felt fatigued.  He had lost 35-45 pounds in the past 6 months.  He was afraid to eat.  He had chronic diarrhea.  Bone scan on 06/22/2017 revealed findings consistent with diffuse metastatic disease.  There was activity in right temporal area, sternum, anterior and posterior ribs bilaterally, and entire spine.  GI was consulted regarding his chronic diarrhea.  He was to follow-up with Dr. Candiss Norse, nephrology, on 06/19/2017.  He was seen by Dr. Bernardo Heater on 06/23/2017.  He is scheduled to see Dr. Allen Norris on 08/15/2017. Patient has never had a colonoscopy.  He is scheduled for bilateral nephrostograms in 2 weeks.    Non-contrast chest CT on 06/22/2017 revealed right infrahilar prominence, progressed from prior CT, worrisome for a node/nodule, but poorly visualized given the lack of IV contrast.  There was mid thoracic lymphadenopathy, progressed, including a dominant 14 mm short axis node at the left thoracic inlet.  There was stable 19 x 9 mm irregular nodule at the right lung apex.  There was a sclerotic osseous metastases at T8-9.  During the interim, patient notes that things has "improved some". Patient is having stools that are more formed. He notes only 6 stools a day, as opposed to the previously reported 12 episodes of diarrhea. Patient is now on Lomotil. He has nausea. He has a very limited appetite. Patient states, "I ate 2 bologna and egg sandwiches yesterday. That was it. I eat one time a day usually".  He was seen by the dietician prior to clinic today. He was provided with meal plans and strategies to assist him employing food choices  that are calorie and protein dense. Patient was encouraged to add supplement shakes to his diet three times a day.   He remains weak and fatigued. He denies any fevers, sweats, and significant weight loss. He has not experienced any recent infections. Patient denies pain in the clinic today.    Past Medical History:  Diagnosis Date  . Depression   . Hyperlipidemia   . Hypertension   . Myocardial infarction [facility])    s/p stent  . Prostate cancer [facility])     Past Surgical History:  Procedure Laterality Date  . CORONARY ANGIOPLASTY WITH STENT PLACEMENT  12/2011   [facility]  . CYSTOSCOPY W/ RETROGRADES Bilateral 05/24/2017   Procedure: CYSTOSCOPY WITH RETROGRADE PYELOGRAM;  Surgeon: [name], MD;  Location: [facility] ORS;  Service: Urology;  Laterality: Bilateral;  . CYSTOSCOPY WITH STENT PLACEMENT Right 05/24/2017   Procedure: CYSTOSCOPY WITH STENT PLACEMENT;  Surgeon: [name], MD;  Location: [facility] ORS;  Service: Urology;  Laterality: Right;  . IR NEPHROSTOMY PLACEMENT LEFT  06/15/2017  . IR NEPHROSTOMY PLACEMENT RIGHT  06/14/2017  . PROSTATE BIOPSY N/A 05/24/2017   Procedure: PROSTATE BIOPSY;  Surgeon: [name], MD;  Location: [facility] ORS;  Service: Urology;  Laterality: N/A;  . US ECHOCARDIOGRAPHY  12/2011   Mild LV dysfunction, EF=45%    Family History  Problem Relation Age of Onset  . Cancer Mother   . Hypertension Mother   . Diabetes Father   . Heart disease  Father   . Cancer Paternal Grandfather   . Prostate cancer Neg Hx   . Bladder Cancer Neg Hx   . Kidney cancer Neg Hx     Social History:  reports that he has been smoking cigars.  He has a 96.00 pack-year smoking history. he has never used smokeless tobacco. He reports that he does not drink alcohol or use drugs.  He has smoked 1 pack per day since age 69 (15 years).  He smokes small cigars.  He does not drink alcohol, but previously drank heavily.  He describes being exposed to Round Up and  asbestos.  He has been disabled for several years secondary to "nerve damage and problems in his legs, arms, and bad back".  He fell off a ladder.  He previously worked for a [employer], [employer].  He lives in [place].  He has step children.  The patient is accompanied by his wife today.  Allergies: No Known Allergies  Current Medications: Current Outpatient Medications  Medication Sig Dispense Refill  . amLODipine (NORVASC) 5 MG tablet TAKE 1 TABLET BY MOUTH EVERY DAY 30 tablet 5  . diphenoxylate-atropine (LOMOTIL) 2.5-0.025 MG tablet Take 1 tablet by mouth 2 (two) times daily as needed for diarrhea or loose stools. 30 tablet 3  . HYDROcodone-acetaminophen (NORCO/VICODIN) 5-325 MG tablet Take 1 tablet by mouth every 4 (four) hours as needed for moderate pain. 120 tablet 0  . tamsulosin (FLOMAX) 0.4 MG CAPS capsule Take 1 capsule (0.4 mg total) by mouth daily. 30 capsule 3   No current facility-administered medications for this visit.     Review of Systems:  GENERAL:  Weak and fatigued.  Not much energy.  No fevers or sweats.  Baseline weight 190-200#.  Weight loss of 35-45# in 6 months. PERFORMANCE STATUS (ECOG):  2. HEENT:  Runny nose.  No visual changes, sore throat, mouth sores or tenderness. Lungs: No shortness of breath or cough.  Little congestion.  No hemoptysis. Cardiac:  No chest pain, palpitations, orthopnea, or PND. h/o MI. GI:  Diarrhea, improved with Lomotil.  Occasional nausea with water.  No vomiting, constipation, melena or hematochezia. GU:  No urgency, frequency, dysuria, or hematuria. Musculoskeletal:  No back pain.  Intermittent left shoulder discomfort.  Arthritis.  No muscle tenderness. Extremities:  No pain or swelling. Skin:  No rashes or skin changes. Neuro:  No headache, numbness or weakness, balance or coordination issues. Endocrine:  No diabetes, thyroid issues, or night sweats.  Hot flashes secondary to Lupron/Degarelix. Psych:  No mood changes, depression  or anxiety. Pain:  Little soreness associated with stents. Review of systems:  All other systems reviewed and found to be negative.  Physical Exam: Blood pressure (!) 141/75, pulse 90, temperature (!) 95.7 F (35.4 C), temperature source Tympanic, resp. rate 18, weight 149 lb (67.6 kg). GENERAL:  Thin gentleman sitting comfortably in the exam room in no acute distress. MENTAL STATUS:  Alert and oriented to person, place and time. HEAD:   Alopecia.  Normocephalic, atraumatic, face symmetric, no Cushingoid features. EYES:  Blue eyes.  Light brown beard.  No conjunctivitis or scleral icterus. BACK:  Bilateral nephrostomy tubes. EXTREMITIES: Right lower extremity edema.  No skin discoloration or tenderness.   NEUROLOGICAL: Unremarkable. PSYCH:  Appropriate.   No visits with results within 3 Day(s) from this visit.  Latest known visit with results is:  Office Visit on 06/23/2017  Component Date Value Ref Range Status  . Prostate Specific Ag, Serum 06/23/2017 3,603.0* 0.0 -  4.0 ng/mL Final   Comment: Results confirmed on dilution. Roche ECLIA methodology. According to the American Urological Association, Serum PSA should decrease and remain at undetectable levels after radical prostatectomy. The AUA defines biochemical recurrence as an initial PSA value 0.2 ng/mL or greater followed by a subsequent confirmatory PSA value 0.2 ng/mL or greater. Values obtained with different assay methods or kits cannot be used interchangeably. Results cannot be interpreted as absolute evidence of the presence or absence of malignant disease.   . Testosterone 06/23/2017 <3* 264 - 916 ng/dL Final   Comment: Adult male reference interval is based on a population of healthy nonobese males (BMI <30) between 66 and 66 years old. [place], [place] [phone]. PMID: [id number].   Marland Kitchen BUN 06/23/2017 35* 8 - 27 mg/dL Final   Specimen received hemolyzed. Clinical correlation indicated.  . Creatinine,  Ser 06/23/2017 2.18* 0.76 - 1.27 mg/dL Final  . GFR calc non Af Amer 06/23/2017 31* >59 mL/min/1.73 Final  . GFR calc Af Amer 06/23/2017 36* >59 mL/min/1.73 Final  . BUN/Creatinine Ratio 06/23/2017 16  10 - 24 Final    Assessment:  Michael Small is a 62 y.o. male with metastatic prostate cancer s/p transrectal prostate biopsies and cystoscopy on 05/24/2017.  Prostate biopsies revealed acinar adenocarcinoma involving all 12 cores.  Adenocarcinoma involved colonic mucosa and skeletal muscle.  Gleason score was 9 (4+5), grade group 5.  PSA was 2338.8 on 04/24/2017.  Abdomen and pelvic CT on 04/27/2017 revealed prominent pelvic adenopathy with pathologic retroperitoneal adenopathy, abnormal retroperitoneal stranding, and abnormal inflammatory type stranding along the pelvic sidewalls and perirectal space.  A representative right external iliac lymph node measured 3.2 cm.  A left external iliac lymph node measured 2.1 cm.  There was conglomerate left periaortic adenopathy 2.1 cm and a 1.4 cm aortocaval node.   There was abnormal adenopathy in the perirectal space with prominent wall thickening in the rectum and moderate wall thickening in the sigmoid colon. The findings along the pelvic sidewalls appeared to be causing distal ureteral obstruction resulting in moderate bilateral hydronephrosis and hydroureter.  Lymphoma or prostate cancer was favored. A right external iliac node measured 3.2 cm.  There was bladder wall thickening suggesting cystitis.  There was prostatomegaly.  Bone scan on 06/22/2017 revealed findings consistent with diffuse metastatic disease.  There was activity in right temporal area, sternum, anterior and posterior ribs bilaterally, and entire spine.  Non-contrast chest CT on 06/27/2017 revealed right infrahilar prominence, progressed from prior CT, worrisome for a node/nodule, but poorly visualized given the lack of IV contrast.  There was mid thoracic lymphadenopathy, progressed,  including a dominant 14 mm short axis node at the left thoracic inlet.  There was stable 19 x 9 mm irregular nodule at the right lung apex.  There was a sclerotic osseous metastases at T8-9.  He received Lupron 22.5 mg on 06/01/2017 and Degarelix 240 mg on 06/15/2017.  He received Casodex from 06/13/2017 - 06/15/2017.  Testosterone was 98 on 06/14/2017.  PSA has been followed:  2338.8 on 04/24/2017 and 3603.0 on 06/23/2017.  He was admitted to [facility] from 06/12/2017 - 06/15/2017 with hyperkalemia and acute renal failure.  Creatinine was 7.23.  He underwent emergent dialysis x1. He is s/p bilateral external nephrostomy tube placement on 06/14/2017.  Bilateral lower extremity duplex on 06/12/2017 revealed no evidence of DVT.   Code status is DNR/DNI.  Symptomatically, he feels fatigued.  He continues to have loose stools, but less often on Lomotil.  He  has mild nausea. He has lost 35-45 pounds in the past 6 months.  He is afraid to eat.    Plan: 1.  Discuss results of bone scan and chest CT.  Discuss widely metastatic disease. 2.  Discuss diagnosis of advanced prostate cancer. Patient to be discussed at multi-disciplinary tumor board later today. Contact patient following tumor board discussions this afternoon to discuss consensus regarding direction of therapy.  3.  Discuss potential treatment options.  He has received received hormonal therapy (Lupron then Degarelix).  He appears to have had a tumor flare.  Discuss benefit of added chemotherapy (Taxotere).  Potential side effects reviewed.  Discuss concern for general debilitated condition with inability to eat and ongoing issues with diarrhea.  Etiology of diarrhea unclear.  Pathology revealed evidence of colon involvement.  He has never had a colonoscopy.  Await GI consultation. 4.  Discuss prognosis. In the absence of treatment, patient's life expectancy is < 6 months. With treatment, life could be extended (2+ years) depending on his response to  treatment.  5.  Discuss code status. Code status confirmed as DNR/DNI today.  6.  Discuss diarrhea. Continue Lomotil as prescribed. Will try to expedite GI consult.  7.  Contact Dr. John Giovanni re: thickening of bladder noted on imaging and irregular appearing tissue on the left hemitrigone with hypervascularity noted on cystoscopy. 8.  Discuss nutrition. Patient encouraged to employ strategies that were discussed with nutritionist today. Encouraged increased protein and calorie intake.  9.  Schedule for chemotherapy class (Taxotere).  10.  Discuss port-a-cath placement. 11.  RTC in 1 week for MD assessment, labs (CBC with diff, CMP), and cycle #1 Taxotere (new).   Addendum:  Patient presented at tumor board.  The etiology of his diarrhea may be due to bowel obstruction.  Consider testing for carcinoid (24 hour urine for 5HIAA and serum chromagranin A).  Chest CT suggests a right perihilar mass.  Biopsy was not suggested.  Await GI evaluation.  Consider diverting colostomy if obstruction is imminent.  Taxotere discussed.    Honor Loh, NP  06/29/2017, 12:02 PM  I saw and evaluated the patient, participating in the key portions of the service and reviewing pertinent diagnostic studies and records.  I reviewed the nurse practitioner's note and agree with the findings and the plan.  The assessment and plan were discussed with the patient.  Multiple questions were asked by the patient and answered.   Nolon Stalls, MD 06/29/2017,12:02 PM

## 2017-06-29 NOTE — Patient Instructions (Signed)
Docetaxel injection What is this medicine? DOCETAXEL (doe se TAX el) is a chemotherapy drug. It targets fast dividing cells, like cancer cells, and causes these cells to die. This medicine is used to treat many types of cancers like breast cancer, certain stomach cancers, head and neck cancer, lung cancer, and prostate cancer. This medicine may be used for other purposes; ask your health care provider or pharmacist if you have questions. COMMON BRAND NAME(S): Docefrez, Taxotere What should I tell my health care provider before I take this medicine? They need to know if you have any of these conditions: -infection (especially a virus infection such as chickenpox, cold sores, or herpes) -liver disease -low blood counts, like low white cell, platelet, or red cell counts -an unusual or allergic reaction to docetaxel, polysorbate 80, other chemotherapy agents, other medicines, foods, dyes, or preservatives -pregnant or trying to get pregnant -breast-feeding How should I use this medicine? This drug is given as an infusion into a vein. It is administered in a hospital or clinic by a specially trained health care professional. Talk to your pediatrician regarding the use of this medicine in children. Special care may be needed. Overdosage: If you think you have taken too much of this medicine contact a poison control center or emergency room at once. NOTE: This medicine is only for you. Do not share this medicine with others. What if I miss a dose? It is important not to miss your dose. Call your doctor or health care professional if you are unable to keep an appointment. What may interact with this medicine? -cyclosporine -erythromycin -ketoconazole -medicines to increase blood counts like filgrastim, pegfilgrastim, sargramostim -vaccines Talk to your doctor or health care professional before taking any of these medicines: -acetaminophen -aspirin -ibuprofen -ketoprofen -naproxen This list  may not describe all possible interactions. Give your health care provider a list of all the medicines, herbs, non-prescription drugs, or dietary supplements you use. Also tell them if you smoke, drink alcohol, or use illegal drugs. Some items may interact with your medicine. What should I watch for while using this medicine? Your condition will be monitored carefully while you are receiving this medicine. You will need important blood work done while you are taking this medicine. This drug may make you feel generally unwell. This is not uncommon, as chemotherapy can affect healthy cells as well as cancer cells. Report any side effects. Continue your course of treatment even though you feel ill unless your doctor tells you to stop. In some cases, you may be given additional medicines to help with side effects. Follow all directions for their use. Call your doctor or health care professional for advice if you get a fever, chills or sore throat, or other symptoms of a cold or flu. Do not treat yourself. This drug decreases your body's ability to fight infections. Try to avoid being around people who are sick. This medicine may increase your risk to bruise or bleed. Call your doctor or health care professional if you notice any unusual bleeding. This medicine may contain alcohol in the product. You may get drowsy or dizzy. Do not drive, use machinery, or do anything that needs mental alertness until you know how this medicine affects you. Do not stand or sit up quickly, especially if you are an older patient. This reduces the risk of dizzy or fainting spells. Avoid alcoholic drinks. Do not become pregnant while taking this medicine. Women should inform their doctor if they wish to become pregnant or   think they might be pregnant. There is a potential for serious side effects to an unborn child. Talk to your health care professional or pharmacist for more information. Do not breast-feed an infant while taking  this medicine. What side effects may I notice from receiving this medicine? Side effects that you should report to your doctor or health care professional as soon as possible: -allergic reactions like skin rash, itching or hives, swelling of the face, lips, or tongue -low blood counts - This drug may decrease the number of white blood cells, red blood cells and platelets. You may be at increased risk for infections and bleeding. -signs of infection - fever or chills, cough, sore throat, pain or difficulty passing urine -signs of decreased platelets or bleeding - bruising, pinpoint red spots on the skin, black, tarry stools, nosebleeds -signs of decreased red blood cells - unusually weak or tired, fainting spells, lightheadedness -breathing problems -fast or irregular heartbeat -low blood pressure -mouth sores -nausea and vomiting -pain, swelling, redness or irritation at the injection site -pain, tingling, numbness in the hands or feet -swelling of the ankle, feet, hands -weight gain Side effects that usually do not require medical attention (report to your doctor or health care professional if they continue or are bothersome): -bone pain -complete hair loss including hair on your head, underarms, pubic hair, eyebrows, and eyelashes -diarrhea -excessive tearing -changes in the color of fingernails -loosening of the fingernails -nausea -muscle pain -red flush to skin -sweating -weak or tired This list may not describe all possible side effects. Call your doctor for medical advice about side effects. You may report side effects to FDA at 1-800-FDA-1088. Where should I keep my medicine? This drug is given in a hospital or clinic and will not be stored at home. NOTE: This sheet is a summary. It may not cover all possible information. If you have questions about this medicine, talk to your doctor, pharmacist, or health care provider.  2018 Elsevier/Gold Standard (2015-06-18 12:32:56)

## 2017-06-29 NOTE — Progress Notes (Signed)
ON PATHWAY REGIMEN - Prostate  No Change  Continue With Treatment as Ordered.   Docetaxel 75 mg/m2:   A cycle is every 21 days:     Docetaxel   **Always confirm dose/[facility] Bical[facility]:   A cycle is every 12 weeks:     Leuprolide acetate depot    Daily:     Bicalutamide   **Always confirm dose/schedule in your pharmacy ordering system**    Patient Characteristics: Adenocarcinoma, Metastatic, Hormone Naive, High Volume Disease* Current radiographic evidence of distant metastasis<= Yes Histology: Adenocarcinoma AJCC T Category: cTX Gleason Primary: X AJCC N Category: N1 Gleason Secondary: X AJCC M Category: M1a Gleason Score: X AJCC 8 Stage Grouping: IVB PSA Values (ng/mL): X Would you be surprised if this patient died  in the next year<= I would NOT be surprised if this patient died in the next year Intent of Therapy: Non-Curative / Palliative Intent, Discussed with Patient

## 2017-06-29 NOTE — Progress Notes (Signed)
Nutrition Assessment   Reason for Assessment:   Weight loss, diarrhea  ASSESSMENT:  62 year old male with prostate cancer.  Past medical history over the past 6-8 months of diarrhea up to 12/day, ureteral stent placement, recent admission with ARF, temporary dialysis, bilateral nephrostomy tube placement, depression, HLD, HTN, MI.    Met with patient and wife in clinic this am.  Patient reports that he drinks ensure high protein 2 daily and eats about 1 meal per day (biscuit with cheese, sandwich).  Reports most foods he eats he has to go to bathroom a few minutes after he eats or sometimes 1-2 hours after he eats.  Reports stools are loose not watery like before and has reduced in number to 2-3 times per day vs 12 per day due to taking lomotil. Reports he has large meal around 2-3 pm in the afternoon (has done for years). Reports ate snack cake with no diarrhea, sometimes drinks chocolate milk with no diarrhea. Has not associated any foods that increase diarrhea.    Patient reports does not want to eat due to having to go to bathroom   Nutrition Focused Physical Exam: deferred  Medications: lomotil  Labs: BUN 35, creatinine 2.18 on 1/25  Anthropometrics:   Height: 71 inches Weight: 150 lb on 1/25 UBW: 180 lb about 6 months ago  BMI: 21  17% weight loss in the last 6 months, significant   Estimated Energy Needs  Kcals: 2000-2300 calories/d Protein: 82-102 g /d Fluid: >2 L/d  NUTRITION DIAGNOSIS: Inadequate oral intake related to altered GI function, diarrhea as evidenced by 17% weight loss in the last 6 months and eating < 75% of energy needs for > or equal to 1 month   MALNUTRITION DIAGNOSIS: Patient meets criteria for severe malnutrition in context of chronic illness as evidenced by 17% weight loss in the last 6 month and eating < 75% of energy needs for > or equal to 1 month   INTERVENTION:  Encouraged patient to keep food diary over the next several weeks about foods  eaten and bowel movements (timing after eating, color, consistency, etc).  Discussed diarrhea nutrition therapy and provided handout from AND to patient and wife.  Encouraged patient to try small frequent meals Discussed importance of adequate calories and protein. Discussed oral nutrition supplements and provided samples and examples of supplements to try.      MONITORING, EVALUATION, GOAL: weight trends, intake, GI symptoms   NEXT VISIT: to be determined with future therapy and treatment plan  Camdon Saetern B. Zenia Resides, [place], [place] Registered Dietitian [phone] (pager)

## 2017-06-29 NOTE — Progress Notes (Signed)
Here for follow up . Per pt has lost one lb since last Friday per pt. Met w dietician this am -helpful per pt and wife.

## 2017-06-30 ENCOUNTER — Telehealth: Payer: Self-pay | Admitting: Radiology

## 2017-06-30 NOTE — Telephone Encounter (Signed)
Wife called requesting lab results from 06/23/2017. Also following up on your last office note: 1.  Schedule bilateral nephrostograms approximately 2 weeks.  Hopefully once castrate testosterone levels are achieved he will have disease progression that his nephrostomies may be removed or at least his left nephrostomy can be converted to an internal stent.

## 2017-07-02 ENCOUNTER — Encounter: Payer: Self-pay | Admitting: Hematology and Oncology

## 2017-07-02 NOTE — Telephone Encounter (Signed)
PSA level was > 3600.  Unlikely obstruction will be relieved in 2 weeks.  Recommend scheduling bilateral nephrostograms in 1 month.  His right nephrostomy tube can be removed if obstruction not present.  He has a stent on that side.  If left obstruction is resolved the nephrostomy tube can be removed and if still present would recommend converting to an antegrade stent.

## 2017-07-03 ENCOUNTER — Inpatient Hospital Stay: Payer: Medicare Other

## 2017-07-03 ENCOUNTER — Other Ambulatory Visit: Payer: Self-pay | Admitting: *Deleted

## 2017-07-03 ENCOUNTER — Other Ambulatory Visit: Payer: Self-pay | Admitting: Radiology

## 2017-07-03 ENCOUNTER — Telehealth: Payer: Self-pay | Admitting: Family Medicine

## 2017-07-03 DIAGNOSIS — N135 Crossing vessel and stricture of ureter without hydronephrosis: Secondary | ICD-10-CM

## 2017-07-03 DIAGNOSIS — C61 Malignant neoplasm of prostate: Secondary | ICD-10-CM

## 2017-07-03 NOTE — Telephone Encounter (Signed)
-----   Message from [name], MD sent at 07/02/2017 10:25 AM EST ----- Hi Wrenn Willcox, Will you schedule Mr. Haskew for bilateral nephrostograms in approximately 1 month.  His right side is stented and the right nephrostomy tube can be removed if no obstruction.  If left obstruction resolved the tube can be removed and if obstruction still present would recommend an antegrade stent.

## 2017-07-03 NOTE — Telephone Encounter (Signed)
Notified wife of Dr Dene Gentry message below. Will contact pt once nephrostograms have been scheduled. Wife voices understanding & has no questions at this time.

## 2017-07-03 NOTE — Patient Instructions (Signed)
Docetaxel injection What is this medicine? DOCETAXEL (doe se TAX el) is a chemotherapy drug. It targets fast dividing cells, like cancer cells, and causes these cells to die. This medicine is used to treat many types of cancers like breast cancer, certain stomach cancers, head and neck cancer, lung cancer, and prostate cancer. This medicine may be used for other purposes; ask your health care provider or pharmacist if you have questions. COMMON BRAND NAME(S): Docefrez, Taxotere What should I tell my health care provider before I take this medicine? They need to know if you have any of these conditions: -infection (especially a virus infection such as chickenpox, cold sores, or herpes) -liver disease -low blood counts, like low white cell, platelet, or red cell counts -an unusual or allergic reaction to docetaxel, polysorbate 80, other chemotherapy agents, other medicines, foods, dyes, or preservatives -pregnant or trying to get pregnant -breast-feeding How should I use this medicine? This drug is given as an infusion into a vein. It is administered in a hospital or clinic by a specially trained health care professional. Talk to your pediatrician regarding the use of this medicine in children. Special care may be needed. Overdosage: If you think you have taken too much of this medicine contact a poison control center or emergency room at once. NOTE: This medicine is only for you. Do not share this medicine with others. What if I miss a dose? It is important not to miss your dose. Call your doctor or health care professional if you are unable to keep an appointment. What may interact with this medicine? -cyclosporine -erythromycin -ketoconazole -medicines to increase blood counts like filgrastim, pegfilgrastim, sargramostim -vaccines Talk to your doctor or health care professional before taking any of these medicines: -acetaminophen -aspirin -ibuprofen -ketoprofen -naproxen This list  may not describe all possible interactions. Give your health care provider a list of all the medicines, herbs, non-prescription drugs, or dietary supplements you use. Also tell them if you smoke, drink alcohol, or use illegal drugs. Some items may interact with your medicine. What should I watch for while using this medicine? Your condition will be monitored carefully while you are receiving this medicine. You will need important blood work done while you are taking this medicine. This drug may make you feel generally unwell. This is not uncommon, as chemotherapy can affect healthy cells as well as cancer cells. Report any side effects. Continue your course of treatment even though you feel ill unless your doctor tells you to stop. In some cases, you may be given additional medicines to help with side effects. Follow all directions for their use. Call your doctor or health care professional for advice if you get a fever, chills or sore throat, or other symptoms of a cold or flu. Do not treat yourself. This drug decreases your body's ability to fight infections. Try to avoid being around people who are sick. This medicine may increase your risk to bruise or bleed. Call your doctor or health care professional if you notice any unusual bleeding. This medicine may contain alcohol in the product. You may get drowsy or dizzy. Do not drive, use machinery, or do anything that needs mental alertness until you know how this medicine affects you. Do not stand or sit up quickly, especially if you are an older patient. This reduces the risk of dizzy or fainting spells. Avoid alcoholic drinks. Do not become pregnant while taking this medicine. Women should inform their doctor if they wish to become pregnant or   think they might be pregnant. There is a potential for serious side effects to an unborn child. Talk to your health care professional or pharmacist for more information. Do not breast-feed an infant while taking  this medicine. What side effects may I notice from receiving this medicine? Side effects that you should report to your doctor or health care professional as soon as possible: -allergic reactions like skin rash, itching or hives, swelling of the face, lips, or tongue -low blood counts - This drug may decrease the number of white blood cells, red blood cells and platelets. You may be at increased risk for infections and bleeding. -signs of infection - fever or chills, cough, sore throat, pain or difficulty passing urine -signs of decreased platelets or bleeding - bruising, pinpoint red spots on the skin, black, tarry stools, nosebleeds -signs of decreased red blood cells - unusually weak or tired, fainting spells, lightheadedness -breathing problems -fast or irregular heartbeat -low blood pressure -mouth sores -nausea and vomiting -pain, swelling, redness or irritation at the injection site -pain, tingling, numbness in the hands or feet -swelling of the ankle, feet, hands -weight gain Side effects that usually do not require medical attention (report to your doctor or health care professional if they continue or are bothersome): -bone pain -complete hair loss including hair on your head, underarms, pubic hair, eyebrows, and eyelashes -diarrhea -excessive tearing -changes in the color of fingernails -loosening of the fingernails -nausea -muscle pain -red flush to skin -sweating -weak or tired This list may not describe all possible side effects. Call your doctor for medical advice about side effects. You may report side effects to FDA at 1-800-FDA-1088. Where should I keep my medicine? This drug is given in a hospital or clinic and will not be stored at home. NOTE: This sheet is a summary. It may not cover all possible information. If you have questions about this medicine, talk to your doctor, pharmacist, or health care provider.  2018 Elsevier/Gold Standard (2015-06-18 12:32:56)

## 2017-07-03 NOTE — Telephone Encounter (Signed)
-----   Message from [name], MD sent at 06/30/2017  1:13 PM EST ----- PSA level was 3603.  This should improve with recent injections.  He will be contacted to schedule bilateral nephrostograms.

## 2017-07-03 NOTE — Telephone Encounter (Signed)
LMOM for patient to return call for results

## 2017-07-04 ENCOUNTER — Inpatient Hospital Stay: Payer: Medicare Other | Attending: Hematology and Oncology

## 2017-07-05 ENCOUNTER — Other Ambulatory Visit: Payer: Self-pay | Admitting: Urology

## 2017-07-05 ENCOUNTER — Telehealth: Payer: Self-pay | Admitting: *Deleted

## 2017-07-05 ENCOUNTER — Other Ambulatory Visit: Payer: Self-pay | Admitting: Radiology

## 2017-07-05 DIAGNOSIS — N135 Crossing vessel and stricture of ureter without hydronephrosis: Secondary | ICD-10-CM

## 2017-07-05 NOTE — Telephone Encounter (Signed)
Wife notified of appt for nephrostograms. Question answered. Wife voices understanding with no further questions at this time.

## 2017-07-05 NOTE — Telephone Encounter (Signed)
Per Gaspar Bidding 07/04/17 staff to change patient 07/06/17 appt. Until after patient is seen by GI- Mr. Durwin Reges. on 07/10/17 Patient is Scheduled on 07/13/17 for Lab/MD/*NEW* Taxotere Spoke to [place] patients wife both are aware of date and time.

## 2017-07-06 ENCOUNTER — Inpatient Hospital Stay: Payer: Medicare Other

## 2017-07-06 ENCOUNTER — Ambulatory Visit: Payer: Medicare Other | Admitting: Gastroenterology

## 2017-07-06 ENCOUNTER — Inpatient Hospital Stay: Payer: Medicare Other | Admitting: Hematology and Oncology

## 2017-07-10 ENCOUNTER — Other Ambulatory Visit: Payer: Self-pay

## 2017-07-10 ENCOUNTER — Encounter: Payer: Self-pay | Admitting: Gastroenterology

## 2017-07-10 ENCOUNTER — Ambulatory Visit (INDEPENDENT_AMBULATORY_CARE_PROVIDER_SITE_OTHER): Payer: Medicare Other | Admitting: Gastroenterology

## 2017-07-10 VITALS — BP 130/68 | HR 98 | Ht 71.0 in | Wt 152.0 lb

## 2017-07-10 DIAGNOSIS — R197 Diarrhea, unspecified: Secondary | ICD-10-CM

## 2017-07-10 DIAGNOSIS — R933 Abnormal findings on diagnostic imaging of other parts of digestive tract: Secondary | ICD-10-CM

## 2017-07-10 NOTE — Progress Notes (Signed)
Gastroenterology Consultation  Referring Provider:     Lequita Asal, MD Primary Care Physician:  Birdie Sons, MD Primary Gastroenterologist:  Dr. Allen Norris     Reason for Consultation:     Diarrhea and abnormal CT scan        HPI:   Michael Small is a 62 y.o. y/o male referred for consultation & management of Diarrhea and abnormal CT scan by Dr. Caryn Section, [name], MD.  This patient comes in after being sent from oncology due to a possible rectal mass.  The patient also has been suffering from profuse diarrhea for some time.  The patient has also lost weight and has been diagnosed with metastatic prostate cancer.  He also was found to have significant adenopathy.  The patient has never had a colonoscopy but did have a cologuard test that was negative.  The patient and his wife state that his diarrhea has gotten better since he was put on Lomotil.  There is no report of any change in bowel habits black stools or bloody stools.  I'm now being asked to see the patient for possible rectal lesion and his diarrhea.  Past Medical History:  Diagnosis Date  . Depression   . Hyperlipidemia   . Hypertension   . Myocardial infarction [facility])    s/p stent  . Prostate cancer [facility])     Past Surgical History:  Procedure Laterality Date  . CORONARY ANGIOPLASTY WITH STENT PLACEMENT  12/2011   [facility]  . CYSTOSCOPY W/ RETROGRADES Bilateral 05/24/2017   Procedure: CYSTOSCOPY WITH RETROGRADE PYELOGRAM;  Surgeon: [name], MD;  Location: [facility] ORS;  Service: Urology;  Laterality: Bilateral;  . CYSTOSCOPY WITH STENT PLACEMENT Right 05/24/2017   Procedure: CYSTOSCOPY WITH STENT PLACEMENT;  Surgeon: [name], MD;  Location: [facility] ORS;  Service: Urology;  Laterality: Right;  . IR NEPHROSTOMY PLACEMENT LEFT  06/15/2017  . IR NEPHROSTOMY PLACEMENT RIGHT  06/14/2017  . PROSTATE BIOPSY N/A 05/24/2017   Procedure: PROSTATE BIOPSY;  Surgeon: [name], MD;  Location: [facility]  ORS;  Service: Urology;  Laterality: N/A;  . US ECHOCARDIOGRAPHY  12/2011   Mild LV dysfunction, EF=45%    Prior to Admission medications   Medication Sig Start Date End Date Taking? Authorizing Provider  amLODipine (NORVASC) 5 MG tablet TAKE 1 TABLET BY MOUTH EVERY DAY 05/18/17  Yes Birdie Sons, MD  diphenoxylate-atropine (LOMOTIL) 2.5-0.025 MG tablet Take 1 tablet by mouth 2 (two) times daily as needed for diarrhea or loose stools. 06/19/17  Yes Birdie Sons, MD  HYDROcodone-acetaminophen (NORCO/VICODIN) 5-325 MG tablet Take 1 tablet by mouth every 4 (four) hours as needed for moderate pain. 06/19/17  Yes Birdie Sons, MD  tamsulosin (FLOMAX) 0.4 MG CAPS capsule Take 1 capsule (0.4 mg total) by mouth daily. 04/28/17  Yes Birdie Sons, MD    Family History  Problem Relation Age of Onset  . Cancer Mother   . Hypertension Mother   . Diabetes Father   . Heart disease Father   . Cancer Paternal Grandfather   . Prostate cancer Neg Hx   . Bladder Cancer Neg Hx   . Kidney cancer Neg Hx      Social History   Tobacco Use  . Smoking status: Current Every Day Smoker    Packs/day: 2.00    Years: 48.00    Pack years: 96.00    Types: Cigars  . Smokeless tobacco: Never Used  .  Tobacco comment: used to amoke 2PPD- lately cut down to 1/2 PPD  Substance Use Topics  . 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 Drug use: No    Allergies as of 07/10/2017  . (No Known Allergies)    Review of Systems:    All systems reviewed and negative except where noted in HPI.   Physical Exam:  BP 130/68   Pulse 98   Ht 5\' 11"  (1.803 m)   Wt 152 lb (68.9 kg)   BMI 21.20 kg/m  No LMP for male patient. Psych:  Alert and cooperative. Normal mood and affect. 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 Eyes:  Sclera clear, no icterus.    Conjunctiva pink. Ears:  Normal auditory acuity. Nose:  No deformity, discharge, or lesions. Mouth:  No deformity or lesions,oropharynx pink & moist. Neck:  Supple; no masses or thyromegaly. Lungs:  Respirations even and unlabored.  Clear throughout to auscultation.   No wheezes, crackles, or rhonchi. No acute distress. Heart:  Regular rate and rhythm; no murmurs, clicks, rubs, or gallops. Abdomen:  Normal bowel sounds.  No bruits.  Soft, non-tender and non-distended without masses, hepatosplenomegaly or hernias noted.  No guarding or rebound tenderness.  Negative Carnett sign.   Rectal:  Deferred.  Msk:  Symmetrical without gross deformities.  Good, equal movement & strength bilaterally. Pulses:  Normal pulses noted. Extremities:  No clubbing or edema.  No cyanosis. Neurologic:  Alert and oriented x3;  grossly normal neurologically. Skin:  Intact without significant lesions or rashes.  No jaundice. Lymph Nodes:  No significant cervical adenopathy. Psych:  Alert and cooperative. Normal mood and affect.  Imaging Studies: Ct Chest Wo Contrast  Result Date: 06/27/2017 CLINICAL DATA:  Prostate cancer, diagnosed 6 months ago. Abnormal right hilum on chest radiograph. Right upper lobe nodule on prior lung cancer screening CT. EXAM: CT CHEST WITHOUT CONTRAST TECHNIQUE: Multidetector CT imaging of the chest was performed following the standard protocol without IV contrast. COMPARISON:  Whole-body bone scan dated 06/22/2017. Chest radiographs dated 06/13/2017. Low-dose lung cancer screening CT chest dated 11/29/2016. FINDINGS: Cardiovascular: The heart is normal in size. No pericardial effusion. No evidence of thoracic aortic aneurysm. Atherosclerotic calcifications of the aortic arch. Three vessel coronary atherosclerosis. Mediastinum/Nodes: Progression of mild thoracic lymphadenopathy, including: --10 mm short axis left supraclavicular node (series 2/image 8), new --14 mm short axis node at the left  thoracic inlet (series 2/image 19), previously 8 mm --8 mm short axis AP window node (series 2/image 52), new --9 mm short axis subcarinal node, new Prominence of the right infrahilar region is worrisome for a node/nodule (series 2/image 80), progressed from the prior CT, although poorly visualized given the lack of intravenous contrast administration. Lungs/Pleura: 19 x 9 mm irregular pulmonary nodule in the right lung apex (series 3/image 22), unchanged from prior low grossly lung cancer screening CT chest. 9 x 4 mm pleural-based nodule in the lateral right lower lobe (series 3/image 110), similar to the prior. Additional scattered pleural/subpleural nodularity in the lungs bilaterally, measuring up to 4-5 mm. Biapical pleural-parenchymal scarring. Mild centrilobular and paraseptal emphysematous changes, upper lobe predominant. No focal consolidation. No pleural effusion or pneumothorax. Upper Abdomen: Visualized upper abdomen is notable for 2.4 cm left adrenal adenoma. Musculoskeletal: Degenerative changes of the visualized thoracolumbar spine. Sclerotic metastases  at T8 and T9 (sagittal image 97). IMPRESSION: Right infrahilar prominence, progressed from prior CT, worrisome for a node/nodule, but poorly visualized given the lack of intravenous contrast administration. Mild thoracic lymphadenopathy, progressed, including a dominant 14 mm short axis node at the left thoracic inlet. Stable 19 x 9 mm irregular nodule at the right lung apex. Sclerotic osseous metastases at T8-9. CT chest with contrast could be performed to better characterized the right infrahilar region. Alternatively, given the additional findings and known metastatic prostate cancer, PET-CT could also be performed for evaluation/staging. These results will be called to the ordering clinician or representative by the Radiologist Assistant, and communication documented in the PACS or zVision Dashboard. Aortic Atherosclerosis (ICD10-I70.0) and  Emphysema (ICD10-J43.9). Electronically Signed   By: Julian Hy M.D.   On: 06/27/2017 13:37   Nm Bone Scan Whole Body  Result Date: 06/23/2017 CLINICAL DATA:  Prostate cancer. EXAM: NUCLEAR MEDICINE WHOLE BODY BONE SCAN TECHNIQUE: Whole body anterior and posterior images were obtained approximately 3 hours after intravenous injection of radiopharmaceutical. RADIOPHARMACEUTICALS:  23.1 mCi Technetium-4m MDP IV COMPARISON:  Ultrasound 06/14/2017. Chest x-ray 06/13/2016. CT 04/27/2017, 11/29/2016. FINDINGS: Decreased renal function cannot be excluded. Bilateral percutaneous nephrostomy tubes are noted with urine noted in the drainage catheters and drainage bags. Focal area of increased activity noted over the right temporal region. Areas of increased activity noted within the sternum. Areas of increased activity noted throughout the anterior and posterior ribs bilaterally. Areas of increased activity noted throughout the entire spine. These findings are consistent with diffuse metastatic disease. Increased activity noted about the shoulders bilaterally most likely degenerative. IMPRESSION: 1.  Findings consistent with diffuse metastatic disease. 2.  Bilateral nephrostomies. Electronically Signed   By: Marcello Moores  Register   On: 06/23/2017 06:05   [place] Intraoperative  Result Date: 06/14/2017 CLINICAL DATA:  Ultrasound was provided for use by the ordering physician, and a technical charge was applied by the performing facility.  No radiologist interpretation/professional services rendered.   US Renal  Result Date: 06/12/2017 CLINICAL DATA:  62 year old male with acute renal failure. Initial encounter. EXAM: RENAL / URINARY TRACT ULTRASOUND COMPLETE COMPARISON:  04/27/2017 CT. FINDINGS: Right Kidney: Length: 11 cm. Mild increased echogenicity renal parenchyma. Moderate right hydronephrosis. Stent in place. Left Kidney: Length: 12.7 cm.  Moderate hydronephrosis. Bladder: Thickening of the posterior bladder  spanning over 6.4 x 2.6.9 cm. Question tumor. IMPRESSION: Moderate bilateral hydronephrosis as noted on prior CT. Right-sided stent has been placed. Thickened posterior bladder wall. Although prostate gland is enlarged and impressing upon the bladder, primary bladder lesion is a consideration. These results will be called to the ordering clinician or representative by the Radiologist Assistant, and communication documented in the PACS or zVision Dashboard. Electronically Signed   By: Genia Del M.D.   On: 06/12/2017 17:25   US Venous Img Lower Bilateral  Result Date: 06/12/2017 CLINICAL DATA:  Bilateral lower extremity pain and edema for the past 6 months. History of dog bite injury involving the right leg approximately 6-7 months ago. History of malignancy. Evaluate for DVT. EXAM: BILATERAL LOWER EXTREMITY VENOUS DOPPLER ULTRASOUND TECHNIQUE: Gray-scale sonography with graded compression, as well as color Doppler and duplex ultrasound were performed to evaluate the lower extremity deep venous systems from the level of the common femoral vein and including the common femoral, femoral, profunda femoral, popliteal and calf veins including the posterior tibial, peroneal and gastrocnemius veins when visible. The superficial great saphenous vein was also interrogated. Spectral Doppler was utilized to evaluate  flow at rest and with distal augmentation maneuvers in the common femoral, femoral and popliteal veins. COMPARISON:  None. FINDINGS: RIGHT LOWER EXTREMITY Common Femoral Vein: No evidence of thrombus. Normal compressibility, respiratory phasicity and response to augmentation. Saphenofemoral Junction: No evidence of thrombus. Normal compressibility and flow on color Doppler imaging. Profunda Femoral Vein: No evidence of thrombus. Normal compressibility and flow on color Doppler imaging. Femoral Vein: No evidence of thrombus. Normal compressibility, respiratory phasicity and response to augmentation. Popliteal  Vein: No evidence of thrombus. Normal compressibility, respiratory phasicity and response to augmentation. Calf Veins: No evidence of thrombus. Normal compressibility and flow on color Doppler imaging. Superficial Great Saphenous Vein: No evidence of thrombus. Normal compressibility. Venous Reflux:  None. Other Findings: Note is made of several prominent though non pathologically enlarged right inguinal lymph nodes with dominant inguinal lymph node measuring approximately 0.9 cm in greatest short axis diameter and maintaining a benign fatty hilum. LEFT LOWER EXTREMITY Common Femoral Vein: No evidence of thrombus. Normal compressibility, respiratory phasicity and response to augmentation. Saphenofemoral Junction: No evidence of thrombus. Normal compressibility and flow on color Doppler imaging. Profunda Femoral Vein: No evidence of thrombus. Normal compressibility and flow on color Doppler imaging. Femoral Vein: No evidence of thrombus. Normal compressibility, respiratory phasicity and response to augmentation. Popliteal Vein: No evidence of thrombus. Normal compressibility, respiratory phasicity and response to augmentation. Calf Veins: No evidence of thrombus. Normal compressibility and flow on color Doppler imaging. Superficial Great Saphenous Vein: Note is made of several prominent though non pathologically enlarged left inguinal lymph nodes with dominant left inguinal lymph node measuring 0.7 cm in greatest short axis diameter. Venous Reflux:  None. Other Findings:  None. IMPRESSION: No evidence of DVT within either lower extremity. Electronically Signed   By: Sandi Mariscal M.D.   On: 06/12/2017 16:42   Dg Chest Port 1 View  Result Date: 06/13/2017 CLINICAL DATA:  62 year old. Failed attempt at left IJ line placement. Initial encounter. EXAM: PORTABLE CHEST 1 VIEW COMPARISON:  11/29/2016 chest CT.  11/11/2013 chest x-ray. FINDINGS: Suspect prominent skin fold rather than pneumothorax. This can be confirmed on  follow-up. Fullness of the right hilar region, underlying mass not excluded. CT chest can be obtained for further delineation Biapical parenchymal changes similar to prior examination. 11/29/2016 chest CT recommended six-month follow-up to confirm stability of nodular density within the right lung apex. No infiltrate or congestive heart failure. Heart size within normal limits. Calcified aorta. Bilateral carotid bifurcation calcifications. No radiopaque foreign body. IMPRESSION: Suspect prominent skin fold rather than pneumothorax. This can be confirmed on follow-up. Fullness of the right hilar region, underlying mass not excluded. CT chest can be obtained for further delineation Biapical parenchymal changes similar to prior examination. 11/29/2016 chest CT recommended six-month follow-up to confirm stability of nodular density within the right lung apex. Electronically Signed   By: Genia Del M.D.   On: 06/13/2017 06:43   Ir Nephrostomy Placement Left  Result Date: 06/15/2017 CLINICAL DATA:  Bilateral hydronephrosis. Previous right ureteral stent placement. Elevated creatinine. EXAM: BILATERAL PERCUTANEOUS NEPHROSTOMY CATHETER PLACEMENT UNDER ULTRASOUND AND FLUOROSCOPIC GUIDANCE FLUOROSCOPY TIME:  0.9 minutes; 32 mGy TECHNIQUE: The procedure, risks (including but not limited to bleeding, infection, organ damage ), benefits, and alternatives were explained to the patient. Questions regarding the procedure were encouraged and answered. The patient understands and consents to the procedure. Bilateral flank regions prepped with chlorhexidine, draped in usual sterile fashion, infiltrated locally with 1% lidocaine. As antibiotic prophylaxis, cefazolin 1 g was ordered  pre-procedure and administered intravenously within one hour of incision. Intravenous Fentanyl and Versed were administered as conscious sedation during continuous monitoring of the patient's level of consciousness and physiological /  cardiorespiratory status by the radiology RN, with a total moderate sedation time of 17 minutes. Under real-time ultrasound guidance, a 21-gauge trocar needle was advanced into a right posterior lower pole calyx. Ultrasound image documentation was saved. Urine spontaneously returned through the needle. Needle was exchanged over a guidewire for transitional dilator. Contrast injection confirmed appropriate positioning. Catheter was exchanged over a guidewire for a 10 French pigtail catheter, formed centrally within the right renal collecting system. Contrast injection confirms appropriate positioning and patency. In similar fashion, under real-time ultrasound guidance, a 21-gauge trocar needle was advanced into a left posterior lower pole calyx. Ultrasound image documentation was saved. Urine spontaneously returned through the needle. Needle was exchanged over a guidewire for transitional dilator. Contrast injection confirmed appropriate positioning. Catheter was exchanged over a guidewire for a 10 French pigtail catheter, formed centrally within the left renal collecting system. Contrast injection confirms appropriate positioning and patency. Catheters secured externally with 0 Prolene suture and StatLocks and placed to external drain bags. The patient tolerated the procedure well. COMPLICATIONS: COMPLICATIONS none IMPRESSION: 1. Technically successful bilateral percutaneous nephrostomy catheter placement. Electronically Signed   By: Lucrezia Europe M.D.   On: 06/15/2017 08:26   Ir Nephrostomy Placement Right  Result Date: 06/15/2017 CLINICAL DATA:  Bilateral hydronephrosis. Previous right ureteral stent placement. Elevated creatinine. EXAM: BILATERAL PERCUTANEOUS NEPHROSTOMY CATHETER PLACEMENT UNDER ULTRASOUND AND FLUOROSCOPIC GUIDANCE FLUOROSCOPY TIME:  0.9 minutes; 32 mGy TECHNIQUE: The procedure, risks (including but not limited to bleeding, infection, organ damage ), benefits, and alternatives were explained to  the patient. Questions regarding the procedure were encouraged and answered. The patient understands and consents to the procedure. Bilateral flank regions prepped with chlorhexidine, draped in usual sterile fashion, infiltrated locally with 1% lidocaine. As antibiotic prophylaxis, cefazolin 1 g was ordered pre-procedure and administered intravenously within one hour of incision. Intravenous Fentanyl and Versed were administered as conscious sedation during continuous monitoring of the patient's level of consciousness and physiological / cardiorespiratory status by the radiology RN, with a total moderate sedation time of 17 minutes. Under real-time ultrasound guidance, a 21-gauge trocar needle was advanced into a right posterior lower pole calyx. Ultrasound image documentation was saved. Urine spontaneously returned through the needle. Needle was exchanged over a guidewire for transitional dilator. Contrast injection confirmed appropriate positioning. Catheter was exchanged over a guidewire for a 10 French pigtail catheter, formed centrally within the right renal collecting system. Contrast injection confirms appropriate positioning and patency. In similar fashion, under real-time ultrasound guidance, a 21-gauge trocar needle was advanced into a left posterior lower pole calyx. Ultrasound image documentation was saved. Urine spontaneously returned through the needle. Needle was exchanged over a guidewire for transitional dilator. Contrast injection confirmed appropriate positioning. Catheter was exchanged over a guidewire for a 10 French pigtail catheter, formed centrally within the left renal collecting system. Contrast injection confirms appropriate positioning and patency. Catheters secured externally with 0 Prolene suture and StatLocks and placed to external drain bags. The patient tolerated the procedure well. COMPLICATIONS: COMPLICATIONS none IMPRESSION: 1. Technically successful bilateral percutaneous  nephrostomy catheter placement. Electronically Signed   By: Lucrezia Europe M.D.   On: 06/15/2017 08:26    Assessment and Plan:   PIERS BAADE is a 62 y.o. y/o male who has been found to have prominent pelvic adenopathy with a possible  wall thickening of the rectum and what appears to be metastatic prostate cancer.  The patient was having significant diarrhea that has gotten better with Lomotil.  The patient will be set up for a colonoscopy to look for a cause of his diarrhea and to make sure there is no rectal mass present. The patient has never had a colonoscopy in the past. I have discussed risks & benefits which include, but are not limited to, bleeding, infection, perforation & drug reaction.  The patient agrees with this plan & written consent will be obtained.     Lucilla Lame, MD. Marval Regal   Note: This dictation was prepared with Dragon dictation along with smaller phrase technology. Any transcriptional errors that result from this process are unintentional.

## 2017-07-11 ENCOUNTER — Telehealth: Payer: Self-pay | Admitting: *Deleted

## 2017-07-11 ENCOUNTER — Other Ambulatory Visit: Payer: Self-pay

## 2017-07-11 DIAGNOSIS — R197 Diarrhea, unspecified: Secondary | ICD-10-CM

## 2017-07-11 NOTE — Telephone Encounter (Signed)
Called patient's wife and spoke to her regarding the phone call from her this morning stating the patient wants to decline chemo treatment at this time.  She states he has an appointment with GI on 07-27-16 and wants to wait and see what they find. She said according to GI they are not sure what they are seeing.   I informed her that Dr. Kem Parkinson concern is that the patient will develop a bowel obstruction which can turn into an emergency situation and can even be fatal.  She states the patient is adamant about waiting until after his GI appointment.  I told her to call us in the meantime if they need [place].

## 2017-07-13 ENCOUNTER — Ambulatory Visit: Payer: Medicare Other

## 2017-07-13 ENCOUNTER — Other Ambulatory Visit: Payer: Medicare Other

## 2017-07-13 ENCOUNTER — Ambulatory Visit: Payer: Medicare Other | Admitting: Hematology and Oncology

## 2017-07-14 DIAGNOSIS — N179 Acute kidney failure, unspecified: Secondary | ICD-10-CM | POA: Diagnosis not present

## 2017-07-14 DIAGNOSIS — N1330 Unspecified hydronephrosis: Secondary | ICD-10-CM | POA: Diagnosis not present

## 2017-07-17 ENCOUNTER — Other Ambulatory Visit: Payer: Self-pay | Admitting: Family Medicine

## 2017-07-17 DIAGNOSIS — R109 Unspecified abdominal pain: Secondary | ICD-10-CM

## 2017-07-17 MED ORDER — HYDROCODONE-ACETAMINOPHEN 5-325 MG PO TABS: 1.0000 | ORAL_TABLET | ORAL | 0 refills | Status: DC | PRN

## 2017-07-17 NOTE — Telephone Encounter (Signed)
Patient is requesting the following medication  HYDROcodone-acetaminophen (NORCO/VICODIN) 5-325 MG tablet   He uses [facility].

## 2017-07-20 ENCOUNTER — Other Ambulatory Visit: Payer: Self-pay

## 2017-07-20 ENCOUNTER — Encounter: Payer: Self-pay | Admitting: Anesthesiology

## 2017-07-21 ENCOUNTER — Ambulatory Visit: Admission: RE | Admit: 2017-07-21 | Payer: Medicare Other | Source: Ambulatory Visit

## 2017-07-21 ENCOUNTER — Ambulatory Visit
Admission: RE | Admit: 2017-07-21 | Discharge: 2017-07-21 | Disposition: A | Discharge status: Home / Self Care | Payer: Medicare Other | Source: Ambulatory Visit | Attending: Urology | Admitting: Urology

## 2017-07-21 ENCOUNTER — Other Ambulatory Visit: Payer: Self-pay | Admitting: Radiology

## 2017-07-21 DIAGNOSIS — I252 Old myocardial infarction: Secondary | ICD-10-CM | POA: Diagnosis not present

## 2017-07-21 DIAGNOSIS — Z8546 Personal history of malignant neoplasm of prostate: Secondary | ICD-10-CM | POA: Diagnosis not present

## 2017-07-21 DIAGNOSIS — I129 Hypertensive chronic kidney disease with stage 1 through stage 4 chronic kidney disease, or unspecified chronic kidney disease: Secondary | ICD-10-CM | POA: Insufficient documentation

## 2017-07-21 DIAGNOSIS — K219 Gastro-esophageal reflux disease without esophagitis: Secondary | ICD-10-CM | POA: Diagnosis not present

## 2017-07-21 DIAGNOSIS — E785 Hyperlipidemia, unspecified: Secondary | ICD-10-CM | POA: Diagnosis not present

## 2017-07-21 DIAGNOSIS — F329 Major depressive disorder, single episode, unspecified: Secondary | ICD-10-CM | POA: Insufficient documentation

## 2017-07-21 DIAGNOSIS — N131 Hydronephrosis with ureteral stricture, not elsewhere classified: Secondary | ICD-10-CM | POA: Diagnosis not present

## 2017-07-21 DIAGNOSIS — N1330 Unspecified hydronephrosis: Secondary | ICD-10-CM

## 2017-07-21 DIAGNOSIS — Z955 Presence of coronary angioplasty implant and graft: Secondary | ICD-10-CM | POA: Insufficient documentation

## 2017-07-21 DIAGNOSIS — N135 Crossing vessel and stricture of ureter without hydronephrosis: Secondary | ICD-10-CM

## 2017-07-21 DIAGNOSIS — F17210 Nicotine dependence, cigarettes, uncomplicated: Secondary | ICD-10-CM | POA: Diagnosis not present

## 2017-07-21 DIAGNOSIS — Z436 Encounter for attention to other artificial openings of urinary tract: Secondary | ICD-10-CM | POA: Diagnosis not present

## 2017-07-21 DIAGNOSIS — N189 Chronic kidney disease, unspecified: Secondary | ICD-10-CM | POA: Diagnosis not present

## 2017-07-21 HISTORY — DX: Chronic kidney disease, unspecified: N18.9

## 2017-07-21 HISTORY — PX: IR NEPHROSTOGRAM LEFT THRU EXISTING ACCESS: IMG6061

## 2017-07-21 HISTORY — PX: IR URETERAL STENT PLACEMENT EXISTING ACCESS LEFT: IMG6073

## 2017-07-21 LAB — BASIC METABOLIC PANEL
Anion gap: 12 (ref 5–15)
BUN: 34 mg/dL — AB (ref 6–20)
CHLORIDE: 106 mmol/L (ref 101–111)
CO2: 20 mmol/L — ABNORMAL LOW (ref 22–32)
CREATININE: 1.7 mg/dL — AB (ref 0.61–1.24)
Calcium: 8.9 mg/dL (ref 8.9–10.3)
GFR calc Af Amer: 48 mL/min — ABNORMAL LOW (ref >60)
GFR calc non Af Amer: 41 mL/min — ABNORMAL LOW (ref >60)
Glucose, Bld: 116 mg/dL — ABNORMAL HIGH (ref 65–99)
Potassium: 4.1 mmol/L (ref 3.5–5.1)
SODIUM: 138 mmol/L (ref 135–145)

## 2017-07-21 MED ORDER — FENTANYL CITRATE (PF) 100 MCG/2ML IJ SOLN
INTRAMUSCULAR | Status: AC | PRN
  Administered 2017-07-21: 50 ug via INTRAVENOUS

## 2017-07-21 MED ORDER — IOPAMIDOL (ISOVUE-300) INJECTION 61%: 25.0000 mL | Freq: Once | INTRAVENOUS | Status: DC | PRN

## 2017-07-21 MED ORDER — SODIUM CHLORIDE 0.9 % IV SOLN
INTRAVENOUS | Status: DC
  Administered 2017-07-21: 08:00:00 via INTRAVENOUS

## 2017-07-21 MED ORDER — MIDAZOLAM HCL 5 MG/5ML IJ SOLN
INTRAMUSCULAR | Status: AC
Start: 2017-07-21 — End: 2017-07-21
  Filled 2017-07-21: qty 5

## 2017-07-21 MED ORDER — FENTANYL CITRATE (PF) 100 MCG/2ML IJ SOLN
INTRAMUSCULAR | Status: AC
  Filled 2017-07-21: qty 4

## 2017-07-21 MED ORDER — LIDOCAINE HCL (PF) 1 % IJ SOLN
INTRAMUSCULAR | Status: AC | PRN
  Administered 2017-07-21: 5 mL

## 2017-07-21 MED ORDER — MIDAZOLAM HCL 5 MG/5ML IJ SOLN
INTRAMUSCULAR | Status: AC | PRN
  Administered 2017-07-21: 1 mg via INTRAVENOUS

## 2017-07-21 NOTE — H&P (Signed)
Chief Complaint: Patient was seen in consultation today for Nephrostograms and possible ureteral stent placement at the request of Stoioff,Scott C  Referring Physician(s): Stoioff,Scott C  Supervising Physician: Sandi Mariscal  Patient Status: [place] - Out-pt  History of Present Illness: Michael Small is a 62 y.o. male with hx of prostate cancer and ureteral obstructions. Had (R)ureteral stent but this failed. Had to have bilateral PCN placed on 1/17. He is now having good UOP from both PCNs. He is here today for nephrostograms and possible further intervention. PMHx, meds, labs, imaging reviewed. Has been NPO this am. Wife at bedside  Past Medical History:  Diagnosis Date  . Chronic kidney disease   . Depression   . GERD (gastroesophageal reflux disease)   . Hyperlipidemia   . Hypertension   . Myocardial infarction [facility])    s/p stent  . Neuromuscular disorder (HCC)    numbness in arms and legs  . Prostate cancer [facility])     Past Surgical History:  Procedure Laterality Date  . CORONARY ANGIOPLASTY WITH STENT PLACEMENT  12/2011   DES LAD [facility] Cardiology  . CYSTOSCOPY W/ RETROGRADES Bilateral 05/24/2017   Procedure: CYSTOSCOPY WITH RETROGRADE PYELOGRAM;  Surgeon: [name], MD;  Location: [facility] ORS;  Service: Urology;  Laterality: Bilateral;  . CYSTOSCOPY WITH STENT PLACEMENT Right 05/24/2017   Procedure: CYSTOSCOPY WITH STENT PLACEMENT;  Surgeon: [name], MD;  Location: [facility] ORS;  Service: Urology;  Laterality: Right;  . IR NEPHROSTOMY PLACEMENT LEFT  06/15/2017  . IR NEPHROSTOMY PLACEMENT RIGHT  06/14/2017  . PROSTATE BIOPSY N/A 05/24/2017   Procedure: PROSTATE BIOPSY;  Surgeon: [name], MD;  Location: [facility] ORS;  Service: Urology;  Laterality: N/A;  . US ECHOCARDIOGRAPHY  12/2011   Mild LV dysfunction, EF=45%    Allergies: Patient has no known allergies.  Medications: Prior to Admission medications   Medication Sig Start Date End Date Taking?  Authorizing Provider  amLODipine (NORVASC) 5 MG tablet TAKE 1 TABLET BY MOUTH EVERY DAY 05/18/17  Yes Birdie Sons, MD  diphenoxylate-atropine (LOMOTIL) 2.5-0.025 MG tablet Take 1 tablet by mouth 2 (two) times daily as needed for diarrhea or loose stools. 06/19/17  Yes Birdie Sons, MD  HYDROcodone-acetaminophen (NORCO/VICODIN) 5-325 MG tablet Take 1 tablet by mouth every 4 (four) hours as needed for moderate pain. 07/17/17  Yes Birdie Sons, MD  tamsulosin (FLOMAX) 0.4 MG CAPS capsule Take 1 capsule (0.4 mg total) by mouth daily. 04/28/17  Yes Birdie Sons, MD     Family History  Problem Relation Age of Onset  . Cancer Mother   . Hypertension Mother   . Diabetes Father   . Heart disease Father   . Cancer Paternal Grandfather   . Prostate cancer Neg Hx   . Bladder Cancer Neg Hx   . Kidney cancer Neg Hx     Social History   Socioeconomic History  . Marital status: Married    Spouse name: None  . Number of children: None  . Years of education: 22  . Highest education level: None  Social Needs  . Financial resource strain: None  . Food insecurity - worry: None  . Food insecurity - inability: None  . Transportation needs - medical: None  . Transportation needs - non-medical: None  Occupational History  . Occupation: Disabled   Tobacco Use  . Smoking status: Current Every Day Smoker    Packs/day: 1.00    Years: 48.00  Pack years: 48.00    Types: Cigars  . Smokeless tobacco: Never Used  . Tobacco comment: used to amoke 2PPD- lately cut down to 1/2 PPD  Substance and Sexual Activity  . 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 Drug use: No  . Sexual activity: None  Other Topics Concern  . None  Social History Narrative   Used to be in Architect, very independent but lately over the past few months- due to weakness, limited activity only.     Review of Systems: A 12 point ROS discussed  and pertinent positives are indicated in the HPI above.  All other systems are negative.  Review of Systems  Vital Signs: BP (!) 172/77   Pulse (!) 106   Temp 98.3 F (36.8 C) (Oral)   Resp 15   SpO2 (!) 30%   Physical Exam  Constitutional: He is oriented to person, place, and time. He appears well-developed. No distress.  HENT:  Head: Normocephalic.  Mouth/Throat: Oropharynx is clear and moist.  Neck: Normal range of motion. No JVD present. No tracheal deviation present.  Cardiovascular: Normal rate, regular rhythm and normal heart sounds.  Pulmonary/Chest: Effort normal. No respiratory distress.  Abdominal: Soft.  (B)PCN intact, sites clean, no leakage. Clear UOP from both  Neurological: He is alert and oriented to person, place, and time.  Skin: Skin is warm and dry.  Psychiatric: He has a normal mood and affect.     Imaging: Ct Chest Wo Contrast  Result Date: 06/27/2017 CLINICAL DATA:  Prostate cancer, diagnosed 6 months ago. Abnormal right hilum on chest radiograph. Right upper lobe nodule on prior lung cancer screening CT. EXAM: CT CHEST WITHOUT CONTRAST TECHNIQUE: Multidetector CT imaging of the chest was performed following the standard protocol without IV contrast. COMPARISON:  Whole-body bone scan dated 06/22/2017. Chest radiographs dated 06/13/2017. Low-dose lung cancer screening CT chest dated 11/29/2016. FINDINGS: Cardiovascular: The heart is normal in size. No pericardial effusion. No evidence of thoracic aortic aneurysm. Atherosclerotic calcifications of the aortic arch. Three vessel coronary atherosclerosis. Mediastinum/Nodes: Progression of mild thoracic lymphadenopathy, including: --10 mm short axis left supraclavicular node (series 2/image 8), new --14 mm short axis node at the left thoracic inlet (series 2/image 19), previously 8 mm --8 mm short axis AP window node (series 2/image 52), new --9 mm short axis subcarinal node, new Prominence of the right infrahilar  region is worrisome for a node/nodule (series 2/image 80), progressed from the prior CT, although poorly visualized given the lack of intravenous contrast administration. Lungs/Pleura: 19 x 9 mm irregular pulmonary nodule in the right lung apex (series 3/image 22), unchanged from prior low grossly lung cancer screening CT chest. 9 x 4 mm pleural-based nodule in the lateral right lower lobe (series 3/image 110), similar to the prior. Additional scattered pleural/subpleural nodularity in the lungs bilaterally, measuring up to 4-5 mm. Biapical pleural-parenchymal scarring. Mild centrilobular and paraseptal emphysematous changes, upper lobe predominant. No focal consolidation. No pleural effusion or pneumothorax. Upper Abdomen: Visualized upper abdomen is notable for 2.4 cm left adrenal adenoma. Musculoskeletal: Degenerative changes of the visualized thoracolumbar spine. Sclerotic metastases at T8 and T9 (sagittal image 97). IMPRESSION: Right infrahilar prominence, progressed from prior CT, worrisome for a node/nodule, but poorly visualized given the lack of intravenous contrast administration. Mild thoracic lymphadenopathy, progressed, including a dominant 14 mm short axis node at the left thoracic inlet. Stable  19 x 9 mm irregular nodule at the right lung apex. Sclerotic osseous metastases at T8-9. CT chest with contrast could be performed to better characterized the right infrahilar region. Alternatively, given the additional findings and known metastatic prostate cancer, PET-CT could also be performed for evaluation/staging. These results will be called to the ordering clinician or representative by the Radiologist Assistant, and communication documented in the PACS or zVision Dashboard. Aortic Atherosclerosis (ICD10-I70.0) and Emphysema (ICD10-J43.9). Electronically Signed   By: Julian Hy M.D.   On: 06/27/2017 13:37   Nm Bone Scan Whole Body  Result Date: 06/23/2017 CLINICAL DATA:  Prostate cancer.  EXAM: NUCLEAR MEDICINE WHOLE BODY BONE SCAN TECHNIQUE: Whole body anterior and posterior images were obtained approximately 3 hours after intravenous injection of radiopharmaceutical. RADIOPHARMACEUTICALS:  23.1 mCi Technetium-21m MDP IV COMPARISON:  Ultrasound 06/14/2017. Chest x-ray 06/13/2016. CT 04/27/2017, 11/29/2016. FINDINGS: Decreased renal function cannot be excluded. Bilateral percutaneous nephrostomy tubes are noted with urine noted in the drainage catheters and drainage bags. Focal area of increased activity noted over the right temporal region. Areas of increased activity noted within the sternum. Areas of increased activity noted throughout the anterior and posterior ribs bilaterally. Areas of increased activity noted throughout the entire spine. These findings are consistent with diffuse metastatic disease. Increased activity noted about the shoulders bilaterally most likely degenerative. IMPRESSION: 1.  Findings consistent with diffuse metastatic disease. 2.  Bilateral nephrostomies. Electronically Signed   By: Marcello Moores  Register   On: 06/23/2017 06:05    Labs:  CBC: Recent Labs    04/17/17 1638 06/12/17 1208 06/13/17 0350  WBC 8.9 6.7 5.5  HGB 13.1* 11.3* 9.2*  HCT 37.8* 33.2* 26.6*  PLT 209 140* 133*    COAGS: Recent Labs    06/12/17 1208  INR 1.22    BMP: Recent Labs    06/12/17 1808 06/12/17 2343 06/13/17 0350 06/14/17 0534 06/15/17 0420 06/23/17 1037  NA 137  --  142 141 137  --   K 7.2* 4.7 4.5 4.0 3.7  --   CL 106  --  100* 95* 96*  --   CO2 16*  --  23 29 27   --   GLUCOSE 92  --  93 98 101*  --   BUN 110*  --  69* 74* 80* 35*  CALCIUM 9.3  --  8.8* 8.6* 8.4*  --   CREATININE 7.23*  --  5.37* 5.82* 5.52* 2.18*  GFRNONAA 7*  --  10* 9* 10* 31*  GFRAA 8*  --  12* 11* 12* 36*    LIVER FUNCTION TESTS: Recent Labs    11/15/16 0858 04/17/17 1638 04/24/17 1107 06/13/17 0350  BILITOT 0.3 0.3  --  1.1  AST 12 11  --  16  ALT 16 8*  --  10*  ALKPHOS  71  --   --  83  PROT 7.0 7.0 6.8 6.5  ALBUMIN 4.5  --   --  3.0*    TUMOR MARKERS: No results for input(s): AFPTM, CEA, CA199, CHROMGRNA in the last 8760 hours.  Assessment and Plan: Prostate Cancer Bilateral ureteral obstruction/stenosis For (B)nephrostograms. If (R)stent patent, will cap (R)PCN, otherwise, leave to gravity bag If (L)ureter patent, will try to place (L)ueretal stent and cap PCN. If successful and able to cap both PCNs, will bring pt back next week for repeat nephrostograms and hopeful PCN removal.  Thank you for this interesting consult.  I greatly enjoyed meeting Michael Small and look forward to  participating in their care.  A copy of this report was sent to the requesting provider on this date.  Electronically Signed: Ascencion Dike, PA-C 07/21/2017, 8:33 AM   I spent a total of 20 minutes in face to face in clinical consultation, greater than 50% of which was counseling/coordinating care for nephrostograms with intervention

## 2017-07-21 NOTE — Progress Notes (Signed)
Patient post procedure with family present. Vitals stable. Dr Pascal Lux out to speak with patient and family. Denies complaints. Discharge instructions given with questions answered.

## 2017-07-21 NOTE — Procedures (Signed)
Pre Procedure Dx: Hydronephrosis Post Procedural Dx: Same  Successful conversion of left sided PCN to a ureteral stent. Right sided antegrade nephrostogram demonstrates patency of the right ureteral stent.  Both PCNs capped.  EBL: None  Complications: None immediate.  Ronny Bacon, MD Pager #: [phone]

## 2017-07-25 ENCOUNTER — Encounter: Payer: Self-pay | Admitting: Hematology and Oncology

## 2017-07-25 NOTE — Discharge Instructions (Signed)
General Anesthesia, Adult, Care After °These instructions provide you with information about caring for yourself after your procedure. Your health care provider may also give you more specific instructions. Your treatment has been planned according to current medical practices, but problems sometimes occur. Call your health care provider if you have any problems or questions after your procedure. °What can I expect after the procedure? °After the procedure, it is common to have: °· Vomiting. °· A sore throat. °· Mental slowness. ° °It is common to feel: °· Nauseous. °· Cold or shivery. °· Sleepy. °· Tired. °· Sore or achy, even in parts of your body where you did not have surgery. ° °Follow these instructions at home: °For at least 24 hours after the procedure: °· Do not: °? Participate in activities where you could fall or become injured. °? Drive. °? Use heavy machinery. °? Drink alcohol. °? Take sleeping pills or medicines that cause drowsiness. °? Make important decisions or sign legal documents. °? Take care of children on your own. °· Rest. °Eating and drinking °· If you vomit, drink water, juice, or soup when you can drink without vomiting. °· Drink enough fluid to keep your urine clear or pale yellow. °· Make sure you have little or no nausea before eating solid foods. °· Follow the diet recommended by your health care provider. °General instructions °· Have a responsible adult stay with you until you are awake and alert. °· Return to your normal activities as told by your health care provider. Ask your health care provider what activities are safe for you. °· Take over-the-counter and prescription medicines only as told by your health care provider. °· If you smoke, do not smoke without supervision. °· Keep all follow-up visits as told by your health care provider. This is important. °Contact a health care provider if: °· You continue to have nausea or vomiting at home, and medicines are not helpful. °· You  cannot drink fluids or start eating again. °· You cannot urinate after 8-12 hours. °· You develop a skin rash. °· You have fever. °· You have increasing redness at the site of your procedure. °Get help right away if: °· You have difficulty breathing. °· You have chest pain. °· You have unexpected bleeding. °· You feel that you are having a life-threatening or urgent problem. °This information is not intended to replace advice given to you by your health care provider. Make sure you discuss any questions you have with your health care provider. °Document Released: 08/22/2000 Document Revised: 10/19/2015 Document Reviewed: 04/30/2015 °Elsevier Interactive Patient Education © 2018 [employer] Inc[employer] ° °

## 2017-07-26 ENCOUNTER — Encounter: Payer: Self-pay | Admitting: Urology

## 2017-07-26 ENCOUNTER — Ambulatory Visit (INDEPENDENT_AMBULATORY_CARE_PROVIDER_SITE_OTHER): Payer: Medicare Other | Admitting: Urology

## 2017-07-26 ENCOUNTER — Other Ambulatory Visit
Admission: AD | Admit: 2017-07-26 | Discharge: 2017-07-26 | Disposition: A | Discharge status: Home / Self Care | Payer: Medicare Other | Source: Ambulatory Visit | Attending: Urology | Admitting: Urology

## 2017-07-26 ENCOUNTER — Telehealth: Payer: Self-pay | Admitting: Urology

## 2017-07-26 ENCOUNTER — Telehealth: Payer: Self-pay | Admitting: *Deleted

## 2017-07-26 VITALS — BP 138/73 | HR 117 | Temp 97.7°F | Ht 70.0 in | Wt 144.6 lb

## 2017-07-26 DIAGNOSIS — B958 Unspecified staphylococcus as the cause of diseases classified elsewhere: Secondary | ICD-10-CM | POA: Diagnosis not present

## 2017-07-26 DIAGNOSIS — C7800 Secondary malignant neoplasm of unspecified lung: Secondary | ICD-10-CM | POA: Diagnosis not present

## 2017-07-26 DIAGNOSIS — N178 Other acute kidney failure: Secondary | ICD-10-CM | POA: Diagnosis not present

## 2017-07-26 DIAGNOSIS — R531 Weakness: Secondary | ICD-10-CM | POA: Diagnosis not present

## 2017-07-26 DIAGNOSIS — C61 Malignant neoplasm of prostate: Secondary | ICD-10-CM

## 2017-07-26 DIAGNOSIS — N390 Urinary tract infection, site not specified: Secondary | ICD-10-CM | POA: Diagnosis not present

## 2017-07-26 DIAGNOSIS — R0602 Shortness of breath: Secondary | ICD-10-CM | POA: Diagnosis not present

## 2017-07-26 DIAGNOSIS — Z936 Other artificial openings of urinary tract status: Secondary | ICD-10-CM | POA: Diagnosis not present

## 2017-07-26 DIAGNOSIS — K449 Diaphragmatic hernia without obstruction or gangrene: Secondary | ICD-10-CM | POA: Diagnosis not present

## 2017-07-26 DIAGNOSIS — N12 Tubulo-interstitial nephritis, not specified as acute or chronic: Secondary | ICD-10-CM | POA: Diagnosis not present

## 2017-07-26 DIAGNOSIS — A4101 Sepsis due to Methicillin susceptible Staphylococcus aureus: Secondary | ICD-10-CM | POA: Diagnosis not present

## 2017-07-26 DIAGNOSIS — N184 Chronic kidney disease, stage 4 (severe): Secondary | ICD-10-CM | POA: Diagnosis not present

## 2017-07-26 DIAGNOSIS — E43 Unspecified severe protein-calorie malnutrition: Secondary | ICD-10-CM | POA: Diagnosis not present

## 2017-07-26 DIAGNOSIS — R609 Edema, unspecified: Secondary | ICD-10-CM | POA: Diagnosis not present

## 2017-07-26 DIAGNOSIS — N136 Pyonephrosis: Secondary | ICD-10-CM | POA: Diagnosis not present

## 2017-07-26 DIAGNOSIS — R509 Fever, unspecified: Secondary | ICD-10-CM | POA: Diagnosis not present

## 2017-07-26 LAB — COMPREHENSIVE METABOLIC PANEL
ALBUMIN: 2.9 g/dL — AB (ref 3.5–5.0)
ALK PHOS: 310 U/L — AB (ref 38–126)
ALT: 30 U/L (ref 17–63)
AST: 22 U/L (ref 15–41)
Anion gap: 14 (ref 5–15)
BILIRUBIN TOTAL: 0.5 mg/dL (ref 0.3–1.2)
BUN: 66 mg/dL — AB (ref 6–20)
CO2: 19 mmol/L — ABNORMAL LOW (ref 22–32)
CREATININE: 2.48 mg/dL — AB (ref 0.61–1.24)
Calcium: 8.2 mg/dL — ABNORMAL LOW (ref 8.9–10.3)
Chloride: 97 mmol/L — ABNORMAL LOW (ref 101–111)
GFR calc Af Amer: 30 mL/min — ABNORMAL LOW (ref >60)
GFR, EST NON AFRICAN AMERICAN: 26 mL/min — AB (ref >60)
Glucose, Bld: 106 mg/dL — ABNORMAL HIGH (ref 65–99)
POTASSIUM: 4 mmol/L (ref 3.5–5.1)
Sodium: 130 mmol/L — ABNORMAL LOW (ref 135–145)
TOTAL PROTEIN: 7.4 g/dL (ref 6.5–8.1)

## 2017-07-26 LAB — CBC
HEMATOCRIT: 27 % — AB (ref 40.0–52.0)
HEMOGLOBIN: 9.2 g/dL — AB (ref 13.0–18.0)
MCH: 30.6 pg (ref 26.0–34.0)
MCHC: 34.1 g/dL (ref 32.0–36.0)
MCV: 89.8 fL (ref 80.0–100.0)
PLATELETS: 253 10*3/uL (ref 150–440)
RBC: 3 MIL/uL — AB (ref 4.40–5.90)
RDW: 15 % — ABNORMAL HIGH (ref 11.5–14.5)
WBC: 7.6 10*3/uL (ref 3.8–10.6)

## 2017-07-26 LAB — PSA: Prostatic Specific Antigen: 1035 ng/mL — ABNORMAL HIGH (ref 0.00–4.00)

## 2017-07-26 MED ORDER — CEFTRIAXONE SODIUM 1 G IJ SOLR: 1.0000 g | Freq: Once | INTRAMUSCULAR | 0 refills | Status: DC

## 2017-07-26 MED ORDER — CEFTRIAXONE SODIUM 500 MG IJ SOLR
1.0000 g | Freq: Once | INTRAMUSCULAR | Status: AC
  Administered 2017-07-26: 1 g via INTRAMUSCULAR

## 2017-07-26 MED ORDER — CEPHALEXIN 500 MG PO CAPS: 500.0000 mg | ORAL_CAPSULE | Freq: Four times a day (QID) | ORAL | 0 refills | Status: DC

## 2017-07-26 NOTE — Telephone Encounter (Addendum)
Patient's wife called to inform us that patient is having excruciating  rib pain.  Hard to take a deep breath.  Per MD patient should go to ED.  Wife is concerned about him having to sit for a long period of time.  I told her we would call ahead to let them know he is coming.

## 2017-07-26 NOTE — Telephone Encounter (Signed)
Pt wife called office stating that nephrostomy bulbs were removed last Friday, Pt started experiencing very severe pain so they took "plugs" out and hooked the tubes back up, states that one bulb is filling up daily, one bulb is barely getting any fluid.  Also states pt ran high fever several days. Please advise. [phone]. Thanks.

## 2017-07-26 NOTE — Progress Notes (Signed)
07/26/2017 5:05 PM   Michael Small Jul 12, 1955 [id number]  Referring provider: Birdie Sons, [place], [place] [address]  Chief Complaint  Patient presents with  . Prostate Cancer    HPI: 62 year old with advanced metastatic prostate cancer, bilateral ureteral obstruction who returns to the office today sooner than expected complaining of low output from his right nephrostomy tube as well as fevers to greater than 102 over the past week.  He also was complaining of severe right sided rib pain.  He underwent internalization of a double-J stent last week on the left as well as exchange of bilateral percutaneous nephrostomy tubes.  These were capped off at the time of the appointment with plans for repeat nephrostogram next week.  On the same day as the procedure, he developed severe flank pain bilaterally and uncapped the tubes on 07/21/2017.  His pain resolved after uncapping of the tubes.  Since that time, he has had significantly more output on the left than the right.  He is voiding only small amounts.  He is also having fevers, chills, decreased appetite, malaise.  He reports having fevers up to 102.5 yesterday evening which occur on a nightly basis starting since his nephrostomy tubes were exchanged.  He was advised to go to the emergency room both by our clinic staff earlier today as well as medical oncology staff but he has refused.  He refuses to go to the emergency room and he refuses to be admitted.   PMH: Past Medical History:  Diagnosis Date  . Chronic kidney disease   . Depression   . GERD (gastroesophageal reflux disease)   . Hyperlipidemia   . Hypertension   . Myocardial infarction [facility])    s/p stent  . Neuromuscular disorder (HCC)    numbness in arms and legs  . Prostate cancer [facility])     Surgical History: Past Surgical History:  Procedure Laterality Date  . CORONARY ANGIOPLASTY WITH STENT PLACEMENT  12/2011   [facility] LAD [facility]  Cardiology  . CYSTOSCOPY W/ RETROGRADES Bilateral 05/24/2017   Procedure: CYSTOSCOPY WITH RETROGRADE PYELOGRAM;  Surgeon: [name], MD;  Location: [facility] ORS;  Service: Urology;  Laterality: Bilateral;  . CYSTOSCOPY WITH STENT PLACEMENT Right 05/24/2017   Procedure: CYSTOSCOPY WITH STENT PLACEMENT;  Surgeon: [name], MD;  Location: [facility] ORS;  Service: Urology;  Laterality: Right;  . IR NEPHROSTOGRAM LEFT THRU EXISTING ACCESS  07/21/2017  . IR NEPHROSTOMY PLACEMENT LEFT  06/15/2017  . IR NEPHROSTOMY PLACEMENT RIGHT  06/14/2017  . IR URETERAL STENT PLACEMENT EXISTING ACCESS LEFT  07/21/2017  . PROSTATE BIOPSY N/A 05/24/2017   Procedure: PROSTATE BIOPSY;  Surgeon: [name], MD;  Location: [facility] ORS;  Service: Urology;  Laterality: N/A;  . US ECHOCARDIOGRAPHY  12/2011   Mild LV dysfunction, EF=45%    Home Medications:  Allergies as of 07/26/2017   No Known Allergies     Medication List        Accurate as of 07/26/17  5:05 PM. Always use your most recent med list.          amLODipine 5 MG tablet Commonly known as:  NORVASC TAKE 1 TABLET BY MOUTH EVERY DAY   diphenoxylate-atropine 2.5-0.025 MG tablet Commonly known as:  LOMOTIL Take 1 tablet by mouth 2 (two) times daily as needed for diarrhea or loose stools.   HYDROcodone-acetaminophen 5-325 MG tablet Commonly known as:  NORCO/VICODIN Take 1 tablet by mouth every 4 (four) hours  as needed for moderate pain.   tamsulosin 0.4 MG Caps capsule Commonly known as:  FLOMAX Take 1 capsule (0.4 mg total) by mouth daily.       Allergies: No Known Allergies  Family History: Family History  Problem Relation Age of Onset  . Cancer Mother   . Hypertension Mother   . Diabetes Father   . Heart disease Father   . Cancer Paternal Grandfather   . Prostate cancer Neg Hx   . Bladder Cancer Neg Hx   . Kidney cancer Neg Hx     Social History:  reports that he has been smoking cigars.  He has a 48.00 pack-year smoking  history. he has never used smokeless tobacco. He reports that he does not drink alcohol or use drugs.  ROS: UROLOGY Frequent Urination?: No Hard to postpone urination?: No Burning/pain with urination?: No Get up at night to urinate?: No Leakage of urine?: No Urine stream starts and stops?: No Trouble starting stream?: No Do you have to strain to urinate?: No Blood in urine?: No Urinary tract infection?: No Sexually transmitted disease?: No Injury to kidneys or bladder?: No Painful intercourse?: No Weak stream?: No Erection problems?: No Penile pain?: No  Gastrointestinal Nausea?: Yes Vomiting?: Yes Indigestion/heartburn?: Yes Diarrhea?: No Constipation?: No  Constitutional Fever: Yes Night sweats?: Yes Weight loss?: Yes Fatigue?: Yes  Skin Skin rash/lesions?: No Itching?: No  Eyes Blurred vision?: No Double vision?: No  Ears/Nose/Throat Sore throat?: No Sinus problems?: No  Hematologic/Lymphatic Swollen glands?: No Easy bruising?: No  Cardiovascular Leg swelling?: Yes Chest pain?: No  Respiratory Cough?: Yes Shortness of breath?: Yes  Endocrine Excessive thirst?: Yes  Musculoskeletal Back pain?: Yes Joint pain?: Yes  Neurological Headaches?: Yes Dizziness?: Yes  Psychologic Depression?: Yes Anxiety?: Yes  Physical Exam: BP 138/73 (BP Location: Left Arm, Patient Position: Sitting, Cuff Size: Normal)   Pulse (!) 117   Temp 97.7 F (36.5 C) (Oral)   Ht 5\' 10"  (1.778 m)   Wt 144 lb 9.6 oz (65.6 kg)   SpO2 (!) 89%   BMI 20.75 kg/m   Constitutional: Frail appearing, unwell.  Accompanied by wife today.Marland Kitchen HEENT: [place] AT, moist mucus membranes.  Trachea midline, no masses. Cardiovascular: No clubbing, cyanosis, or edema. Respiratory: Normal respiratory effort, no increased work of breathing. GI: Abdomen is soft, nontender, nondistended, no abdominal masses GU: Bilateral nephrostomy tube sites inspected, there is purulent/necrotic drainage  around the left nephrostomy site but when wiped away, no significant erythema or drainage.  Right nephrostomy tube site clean dry and intact.  Bilateral tubes draining cloudy urine. Skin: No rashes, bruises or suspicious lesions. Neurologic: Grossly intact, no focal deficits, moving all 4 extremities. Psychiatric: Normal mood and affect.  Laboratory Data: Lab Results  Component Value Date   WBC 7.6 07/26/2017   HGB 9.2 (L) 07/26/2017   HCT 27.0 (L) 07/26/2017   MCV 89.8 07/26/2017   PLT 253 07/26/2017    Lab Results  Component Value Date   CREATININE 2.48 (H) 07/26/2017    Lab Results  Component Value Date   PSA1 3,603.0 (H) 06/23/2017    Lab Results  Component Value Date   TESTOSTERONE <3 (L) 06/23/2017    Lab Results  Component Value Date   HGBA1C 5.8 (H) 11/15/2016    Urinalysis UA today personally reviewed, shows greater than 30 red blood cells as well as 30 white blood cells per high-powered field, nitrite negative.  Many bacteria.  No yeast.  Pertinent Imaging: Bilateral nephrostogram  imaging reviewed from 07/21/2017.  Assessment & Plan:   62 year old male with advanced metastatic prostate cancer with bilateral ureteral obstruction status post recent internalization of JJ stents who presents acutely to the office today complaining of malaise, fevers to 102.  Clinically, he appears unwell.  He was initially tachycardic but his heart rate improved to the 80s with rest.  He is also hypoxic compared to his baseline sats (underlying COPD).  Stat labs showed no evidence of leukocytosis but he does have worsening renal function, mild hyponatremia, and concern for underlying pyelonephritis/sepsis in the setting of fevers to 102.  Stat labs were obtained today including CMP, CBC, urine and blood cultures, PSA.  Throughout the visit today, I continued to urge he and his wife to go to the emergency room.  I offered to call in an expect to expedite his care.  Alternatively, I  offered to arrange for direct admission.  Despite my efforts, he continues to decline further care.  He was quite adamant about this.  He understands that untreated sepsis and other medical conditions as outline about could result in death without appropriate treatment.  Despite this, he refused admission.  He was administered IM ceftriaxone in the office today.  I will call in oral antibiotic for him.  He is a follow-up with Dr. Bernardo Heater later this week.  F/u urine and blood cultures.    Michael Espy, MD  [facility] [address], [place], [place] [address] [phone]  I spent 40 min with this patient of which greater than 50% was spent in counseling and coordination of care with the patient.

## 2017-07-26 NOTE — Telephone Encounter (Signed)
Spoke with patient and Dr. Erlene Quan. Patient is coming in to be seen.

## 2017-07-27 ENCOUNTER — Ambulatory Visit: Admission: RE | Admit: 2017-07-27 | Payer: Medicare Other | Source: Ambulatory Visit | Admitting: Gastroenterology

## 2017-07-27 HISTORY — DX: Myoneural disorder, unspecified: G70.9

## 2017-07-27 LAB — BLOOD CULTURE ID PANEL (REFLEXED)
Acinetobacter baumannii: NOT DETECTED
CANDIDA ALBICANS: NOT DETECTED
CANDIDA GLABRATA: NOT DETECTED
CANDIDA PARAPSILOSIS: NOT DETECTED
CANDIDA TROPICALIS: NOT DETECTED
Candida krusei: NOT DETECTED
ENTEROBACTER CLOACAE COMPLEX: NOT DETECTED
ENTEROBACTERIACEAE SPECIES: NOT DETECTED
ESCHERICHIA COLI: NOT DETECTED
Enterococcus species: NOT DETECTED
Haemophilus influenzae: NOT DETECTED
KLEBSIELLA PNEUMONIAE: NOT DETECTED
Klebsiella oxytoca: NOT DETECTED
Listeria monocytogenes: NOT DETECTED
Methicillin resistance: NOT DETECTED
Neisseria meningitidis: NOT DETECTED
PROTEUS SPECIES: NOT DETECTED
Pseudomonas aeruginosa: NOT DETECTED
STAPHYLOCOCCUS SPECIES: DETECTED — AB
STREPTOCOCCUS PYOGENES: NOT DETECTED
STREPTOCOCCUS SPECIES: NOT DETECTED
Serratia marcescens: NOT DETECTED
Staphylococcus aureus (BCID): DETECTED — AB
Streptococcus agalactiae: NOT DETECTED
Streptococcus pneumoniae: NOT DETECTED

## 2017-07-27 LAB — URINALYSIS, COMPLETE
BILIRUBIN UA: NEGATIVE
GLUCOSE, UA: NEGATIVE
Ketones, UA: NEGATIVE
NITRITE UA: NEGATIVE
Specific Gravity, UA: 1.015 (ref 1.005–1.030)
UUROB: 0.2 mg/dL (ref 0.2–1.0)
pH, UA: 7 (ref 5.0–7.5)

## 2017-07-27 LAB — MICROSCOPIC EXAMINATION

## 2017-07-27 LAB — TESTOSTERONE: Testosterone: 23 ng/dL — ABNORMAL LOW (ref 264–916)

## 2017-07-27 SURGERY — COLONOSCOPY WITH PROPOFOL
Anesthesia: Choice

## 2017-07-28 ENCOUNTER — Ambulatory Visit
Admission: RE | Admit: 2017-07-28 | Discharge: 2017-07-28 | Disposition: A | Discharge status: Home / Self Care | Payer: Medicare Other | Source: Ambulatory Visit | Attending: Urology | Admitting: Urology

## 2017-07-28 ENCOUNTER — Emergency Department: Payer: Medicare Other

## 2017-07-28 ENCOUNTER — Inpatient Hospital Stay: Payer: Medicare Other

## 2017-07-28 ENCOUNTER — Inpatient Hospital Stay
Admission: EM | Admit: 2017-07-28 | Discharge: 2017-07-31 | DRG: 871 | Disposition: A | Discharge status: Home Health | Payer: Medicare Other | Attending: Internal Medicine | Admitting: Internal Medicine

## 2017-07-28 ENCOUNTER — Encounter: Payer: Self-pay | Admitting: Medical Oncology

## 2017-07-28 ENCOUNTER — Other Ambulatory Visit: Payer: Self-pay

## 2017-07-28 DIAGNOSIS — R109 Unspecified abdominal pain: Secondary | ICD-10-CM

## 2017-07-28 DIAGNOSIS — A4101 Sepsis due to Methicillin susceptible Staphylococcus aureus: Secondary | ICD-10-CM | POA: Diagnosis present

## 2017-07-28 DIAGNOSIS — F17210 Nicotine dependence, cigarettes, uncomplicated: Secondary | ICD-10-CM | POA: Diagnosis present

## 2017-07-28 DIAGNOSIS — G709 Myoneural disorder, unspecified: Secondary | ICD-10-CM | POA: Diagnosis present

## 2017-07-28 DIAGNOSIS — Z955 Presence of coronary angioplasty implant and graft: Secondary | ICD-10-CM

## 2017-07-28 DIAGNOSIS — C61 Malignant neoplasm of prostate: Secondary | ICD-10-CM | POA: Diagnosis not present

## 2017-07-28 DIAGNOSIS — Z79899 Other long term (current) drug therapy: Secondary | ICD-10-CM

## 2017-07-28 DIAGNOSIS — D631 Anemia in chronic kidney disease: Secondary | ICD-10-CM | POA: Diagnosis present

## 2017-07-28 DIAGNOSIS — R0602 Shortness of breath: Secondary | ICD-10-CM | POA: Diagnosis not present

## 2017-07-28 DIAGNOSIS — I339 Acute and subacute endocarditis, unspecified: Secondary | ICD-10-CM | POA: Diagnosis not present

## 2017-07-28 DIAGNOSIS — I700 Atherosclerosis of aorta: Secondary | ICD-10-CM | POA: Diagnosis not present

## 2017-07-28 DIAGNOSIS — N189 Chronic kidney disease, unspecified: Secondary | ICD-10-CM | POA: Diagnosis not present

## 2017-07-28 DIAGNOSIS — Z936 Other artificial openings of urinary tract status: Secondary | ICD-10-CM | POA: Diagnosis not present

## 2017-07-28 DIAGNOSIS — R509 Fever, unspecified: Secondary | ICD-10-CM | POA: Diagnosis not present

## 2017-07-28 DIAGNOSIS — R531 Weakness: Secondary | ICD-10-CM

## 2017-07-28 DIAGNOSIS — C7800 Secondary malignant neoplasm of unspecified lung: Secondary | ICD-10-CM | POA: Diagnosis present

## 2017-07-28 DIAGNOSIS — N136 Pyonephrosis: Secondary | ICD-10-CM | POA: Diagnosis present

## 2017-07-28 DIAGNOSIS — I252 Old myocardial infarction: Secondary | ICD-10-CM

## 2017-07-28 DIAGNOSIS — R609 Edema, unspecified: Secondary | ICD-10-CM | POA: Diagnosis not present

## 2017-07-28 DIAGNOSIS — E43 Unspecified severe protein-calorie malnutrition: Secondary | ICD-10-CM | POA: Diagnosis present

## 2017-07-28 DIAGNOSIS — Z538 Procedure and treatment not carried out for other reasons: Secondary | ICD-10-CM | POA: Diagnosis not present

## 2017-07-28 DIAGNOSIS — R7989 Other specified abnormal findings of blood chemistry: Secondary | ICD-10-CM

## 2017-07-28 DIAGNOSIS — D630 Anemia in neoplastic disease: Secondary | ICD-10-CM | POA: Diagnosis present

## 2017-07-28 DIAGNOSIS — N390 Urinary tract infection, site not specified: Secondary | ICD-10-CM | POA: Diagnosis not present

## 2017-07-28 DIAGNOSIS — N184 Chronic kidney disease, stage 4 (severe): Secondary | ICD-10-CM | POA: Diagnosis present

## 2017-07-28 DIAGNOSIS — I2510 Atherosclerotic heart disease of native coronary artery without angina pectoris: Secondary | ICD-10-CM | POA: Diagnosis not present

## 2017-07-28 DIAGNOSIS — R7881 Bacteremia: Secondary | ICD-10-CM | POA: Diagnosis not present

## 2017-07-28 DIAGNOSIS — A419 Sepsis, unspecified organism: Secondary | ICD-10-CM | POA: Diagnosis not present

## 2017-07-28 DIAGNOSIS — K449 Diaphragmatic hernia without obstruction or gangrene: Secondary | ICD-10-CM | POA: Diagnosis not present

## 2017-07-28 DIAGNOSIS — T83512A Infection and inflammatory reaction due to nephrostomy catheter, initial encounter: Secondary | ICD-10-CM | POA: Diagnosis not present

## 2017-07-28 DIAGNOSIS — I129 Hypertensive chronic kidney disease with stage 1 through stage 4 chronic kidney disease, or unspecified chronic kidney disease: Secondary | ICD-10-CM | POA: Diagnosis present

## 2017-07-28 DIAGNOSIS — Z6820 Body mass index (BMI) 20.0-20.9, adult: Secondary | ICD-10-CM

## 2017-07-28 DIAGNOSIS — I10 Essential (primary) hypertension: Secondary | ICD-10-CM | POA: Diagnosis not present

## 2017-07-28 DIAGNOSIS — Z66 Do not resuscitate: Secondary | ICD-10-CM | POA: Diagnosis present

## 2017-07-28 DIAGNOSIS — Z960 Presence of urogenital implants: Secondary | ICD-10-CM | POA: Diagnosis present

## 2017-07-28 DIAGNOSIS — Z515 Encounter for palliative care: Secondary | ICD-10-CM | POA: Diagnosis present

## 2017-07-28 DIAGNOSIS — B958 Unspecified staphylococcus as the cause of diseases classified elsewhere: Secondary | ICD-10-CM | POA: Diagnosis not present

## 2017-07-28 DIAGNOSIS — N1330 Unspecified hydronephrosis: Secondary | ICD-10-CM

## 2017-07-28 DIAGNOSIS — M7989 Other specified soft tissue disorders: Secondary | ICD-10-CM | POA: Diagnosis not present

## 2017-07-28 LAB — CBC WITH DIFFERENTIAL/PLATELET
BAND NEUTROPHILS: 2 %
BASOS ABS: 0.1 10*3/uL (ref 0–0.1)
BASOS PCT: 1 %
Blasts: 0 %
EOS ABS: 0.3 10*3/uL (ref 0–0.7)
EOS PCT: 3 %
HCT: 26.2 % — ABNORMAL LOW (ref 40.0–52.0)
Hemoglobin: 9.1 g/dL — ABNORMAL LOW (ref 13.0–18.0)
LYMPHS ABS: 2.3 10*3/uL (ref 1.0–3.6)
Lymphocytes Relative: 22 %
MCH: 31.5 pg (ref 26.0–34.0)
MCHC: 34.9 g/dL (ref 32.0–36.0)
MCV: 90.1 fL (ref 80.0–100.0)
METAMYELOCYTES PCT: 1 %
MONO ABS: 0.7 10*3/uL (ref 0.2–1.0)
MONOS PCT: 7 %
MYELOCYTES: 2 %
NEUTROS ABS: 7.1 10*3/uL — AB (ref 1.4–6.5)
Neutrophils Relative %: 62 %
Other: 0 %
PLATELETS: 292 10*3/uL (ref 150–440)
Promyelocytes Absolute: 0 %
RBC: 2.91 MIL/uL — AB (ref 4.40–5.90)
RDW: 15.3 % — ABNORMAL HIGH (ref 11.5–14.5)
WBC: 10.5 10*3/uL (ref 3.8–10.6)
nRBC: 0 /100 WBC

## 2017-07-28 LAB — COMPREHENSIVE METABOLIC PANEL
ALBUMIN: 2.5 g/dL — AB (ref 3.5–5.0)
ALT: 38 U/L (ref 17–63)
AST: 24 U/L (ref 15–41)
Alkaline Phosphatase: 332 U/L — ABNORMAL HIGH (ref 38–126)
Anion gap: 13 (ref 5–15)
BUN: 72 mg/dL — AB (ref 6–20)
CHLORIDE: 102 mmol/L (ref 101–111)
CO2: 20 mmol/L — AB (ref 22–32)
CREATININE: 2.52 mg/dL — AB (ref 0.61–1.24)
Calcium: 8.5 mg/dL — ABNORMAL LOW (ref 8.9–10.3)
GFR calc Af Amer: 30 mL/min — ABNORMAL LOW (ref >60)
GFR, EST NON AFRICAN AMERICAN: 26 mL/min — AB (ref >60)
GLUCOSE: 112 mg/dL — AB (ref 65–99)
POTASSIUM: 4.5 mmol/L (ref 3.5–5.1)
Sodium: 135 mmol/L (ref 135–145)
Total Bilirubin: 0.5 mg/dL (ref 0.3–1.2)
Total Protein: 7.2 g/dL (ref 6.5–8.1)

## 2017-07-28 LAB — URINALYSIS, ROUTINE W REFLEX MICROSCOPIC
BILIRUBIN URINE: NEGATIVE
Bacteria, UA: NONE SEEN
Glucose, UA: NEGATIVE mg/dL
Ketones, ur: NEGATIVE mg/dL
Nitrite: NEGATIVE
PH: 5 (ref 5.0–8.0)
Protein, ur: 100 mg/dL — AB
SQUAMOUS EPITHELIAL / LPF: NONE SEEN
Specific Gravity, Urine: 1.012 (ref 1.005–1.030)

## 2017-07-28 LAB — CULTURE, URINE COMPREHENSIVE

## 2017-07-28 LAB — LACTIC ACID, PLASMA: Lactic Acid, Venous: 1.3 mmol/L (ref 0.5–1.9)

## 2017-07-28 MED ORDER — SODIUM CHLORIDE 0.9 % IV SOLN
INTRAVENOUS | Status: DC
  Administered 2017-07-28 – 2017-07-30 (×5): via INTRAVENOUS

## 2017-07-28 MED ORDER — PIPERACILLIN-TAZOBACTAM 3.375 G IVPB: 3.3750 g | Freq: Three times a day (TID) | INTRAVENOUS | Status: DC

## 2017-07-28 MED ORDER — TAMSULOSIN HCL 0.4 MG PO CAPS
0.4000 mg | ORAL_CAPSULE | Freq: Every day | ORAL | Status: DC
  Administered 2017-07-29 – 2017-07-31 (×3): 0.4 mg via ORAL
  Filled 2017-07-28 (×3): qty 1

## 2017-07-28 MED ORDER — VANCOMYCIN HCL IN DEXTROSE 750-5 MG/150ML-% IV SOLN
750.0000 mg | INTRAVENOUS | Status: DC
  Filled 2017-07-28: qty 150

## 2017-07-28 MED ORDER — NICOTINE 21 MG/24HR TD PT24: 21.0000 mg | MEDICATED_PATCH | Freq: Every day | TRANSDERMAL | Status: DC

## 2017-07-28 MED ORDER — ACETAMINOPHEN 325 MG PO TABS: 650.0000 mg | ORAL_TABLET | Freq: Four times a day (QID) | ORAL | Status: DC | PRN

## 2017-07-28 MED ORDER — CEFAZOLIN SODIUM-DEXTROSE 1-4 GM/50ML-% IV SOLN
1.0000 g | Freq: Two times a day (BID) | INTRAVENOUS | Status: DC
  Administered 2017-07-28 – 2017-07-31 (×7): 1 g via INTRAVENOUS
  Filled 2017-07-28 (×8): qty 50

## 2017-07-28 MED ORDER — ACETAMINOPHEN 650 MG RE SUPP: 650.0000 mg | Freq: Four times a day (QID) | RECTAL | Status: DC | PRN

## 2017-07-28 MED ORDER — NICOTINE 21 MG/24HR TD PT24
21.0000 mg | MEDICATED_PATCH | Freq: Every day | TRANSDERMAL | Status: DC
  Filled 2017-07-28 (×3): qty 1

## 2017-07-28 MED ORDER — AMLODIPINE BESYLATE 5 MG PO TABS
5.0000 mg | ORAL_TABLET | Freq: Every day | ORAL | Status: DC
  Administered 2017-07-29 – 2017-07-31 (×3): 5 mg via ORAL
  Filled 2017-07-28 (×3): qty 1

## 2017-07-28 MED ORDER — ONDANSETRON HCL 4 MG PO TABS: 4.0000 mg | ORAL_TABLET | Freq: Four times a day (QID) | ORAL | Status: DC | PRN

## 2017-07-28 MED ORDER — PIPERACILLIN-TAZOBACTAM 3.375 G IVPB 30 MIN
3.3750 g | Freq: Once | INTRAVENOUS | Status: AC
  Administered 2017-07-28: 3.375 g via INTRAVENOUS
  Filled 2017-07-28: qty 50

## 2017-07-28 MED ORDER — HYDROCODONE-ACETAMINOPHEN 5-325 MG PO TABS
1.0000 | ORAL_TABLET | ORAL | Status: DC | PRN
Start: 2017-07-28 — End: 2017-08-01
  Administered 2017-07-28 – 2017-07-31 (×7): 1 via ORAL
  Filled 2017-07-28 (×7): qty 1

## 2017-07-28 MED ORDER — ONDANSETRON HCL 4 MG/2ML IJ SOLN: 4.0000 mg | Freq: Four times a day (QID) | INTRAMUSCULAR | Status: DC | PRN

## 2017-07-28 MED ORDER — VANCOMYCIN HCL IN DEXTROSE 1-5 GM/200ML-% IV SOLN
1000.0000 mg | Freq: Once | INTRAVENOUS | Status: AC
  Administered 2017-07-28: 1000 mg via INTRAVENOUS
  Filled 2017-07-28: qty 200

## 2017-07-28 MED ORDER — HEPARIN SODIUM (PORCINE) 5000 UNIT/ML IJ SOLN
5000.0000 [IU] | Freq: Three times a day (TID) | INTRAMUSCULAR | Status: DC
  Administered 2017-07-28 – 2017-07-30 (×5): 5000 [IU] via SUBCUTANEOUS
  Filled 2017-07-28 (×6): qty 1

## 2017-07-28 MED ORDER — SODIUM CHLORIDE 0.9 % IV BOLUS (SEPSIS)
1000.0000 mL | Freq: Once | INTRAVENOUS | Status: AC
  Administered 2017-07-28: 1000 mL via INTRAVENOUS

## 2017-07-28 NOTE — H&P (Signed)
[facility] [place] NAME: Michael Small    MR#:  [id number]  DATE OF BIRTH:  Jan 23, 1956  DATE OF ADMISSION:  07/28/2017  PRIMARY CARE PHYSICIAN: Birdie Sons, MD   REQUESTING/REFERRING PHYSICIAN: Wyman Songster MD  CHIEF COMPLAINT:   Chief Complaint  Patient presents with  . Fever    HISTORY OF PRESENT ILLNESS: Michael Small  is a 62 y.o. male with a known history of kidney disease, depression, GERD, hypertension hyperlipidemia and metastatic prostate cancer who has bilateral nephrostomy tubes.  Who is been having fevers at home.  He was seen by urology a few days ago in the office and was noted to have UTI and also was noted to have positive blood cultures however patient refused to be admitted he was treated with oral antibiotics.  He went to radiology department today to have nephrostomy tubes removed however there he was very weak therefore he was referred to the ER.  In the ER he was noted to have a temperature of 102.  He also has drainage around the left nephrostomy tube site greenish in color.  I discussed the case with the interventional radiologist who stated that he discussed the case with Dr. Erlene Quan who states not to do anything with the nephrostomy tubes at this point.  Patient himself denies any complaints denies abdominal pain nausea vomiting or diarrhea  PAST MEDICAL HISTORY:   Past Medical History:  Diagnosis Date  . Chronic kidney disease   . Depression   . GERD (gastroesophageal reflux disease)   . Hyperlipidemia   . Hypertension   . Myocardial infarction [facility])    s/p stent  . Neuromuscular disorder (HCC)    numbness in arms and legs  . Prostate cancer ([place])     PAST SURGICAL HISTORY:  Past Surgical History:  Procedure Laterality Date  . CORONARY ANGIOPLASTY WITH STENT PLACEMENT  12/2011   [facility]  . CYSTOSCOPY W/ RETROGRADES Bilateral 05/24/2017   Procedure: CYSTOSCOPY WITH RETROGRADE PYELOGRAM;   Surgeon: [name], MD;  Location: [facility] ORS;  Service: Urology;  Laterality: Bilateral;  . CYSTOSCOPY WITH STENT PLACEMENT Right 05/24/2017   Procedure: CYSTOSCOPY WITH STENT PLACEMENT;  Surgeon: [name], MD;  Location: [facility] ORS;  Service: Urology;  Laterality: Right;  . IR NEPHROSTOGRAM LEFT THRU EXISTING ACCESS  07/21/2017  . IR NEPHROSTOMY PLACEMENT LEFT  06/15/2017  . IR NEPHROSTOMY PLACEMENT RIGHT  06/14/2017  . IR URETERAL STENT PLACEMENT EXISTING ACCESS LEFT  07/21/2017  . PROSTATE BIOPSY N/A 05/24/2017   Procedure: PROSTATE BIOPSY;  Surgeon: [name], MD;  Location: [facility] ORS;  Service: Urology;  Laterality: N/A;  . US ECHOCARDIOGRAPHY  12/2011   Mild LV dysfunction, EF=45%    SOCIAL HISTORY:  Social History   Tobacco Use  . Smoking status: Current Every Day Smoker    Packs/day: 1.00    Years: 48.00    Pack years: 48.00    Types: Cigars  . Smokeless tobacco: Never Used  . Tobacco comment: used to amoke 2PPD- lately cut down to 1/2 PPD  Substance Use Topics  . 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 Cancer Mother   . Hypertension Mother   . Diabetes Father   . Heart disease Father   .  Cancer Paternal Grandfather   . Prostate cancer Neg Hx   . Bladder Cancer Neg Hx   . Kidney cancer Neg Hx     DRUG ALLERGIES: No Known Allergies  REVIEW OF SYSTEMS:   CONSTITUTIONAL: Positive fever, positive fatigue or weakness.  EYES: No blurred or double vision.  EARS, NOSE, AND THROAT: No tinnitus or ear pain.  RESPIRATORY: No cough, shortness of breath, wheezing or hemoptysis.  CARDIOVASCULAR: No chest pain, orthopnea, edema.  GASTROINTESTINAL: No nausea, vomiting, diarrhea or abdominal pain.  GENITOURINARY: No dysuria, hematuria.  ENDOCRINE: No polyuria, nocturia,  HEMATOLOGY: No anemia, easy bruising or bleeding SKIN:  No rash or lesion. MUSCULOSKELETAL: No joint pain or arthritis.   NEUROLOGIC: No tingling, numbness, weakness.  PSYCHIATRY: No anxiety or depression.   MEDICATIONS AT HOME:  Prior to Admission medications   Medication Sig Start Date End Date Taking? Authorizing Provider  amLODipine (NORVASC) 5 MG tablet TAKE 1 TABLET BY MOUTH EVERY DAY 05/18/17  Yes Birdie Sons, MD  cephALEXin (KEFLEX) 500 MG capsule Take 1 capsule (500 mg total) by mouth 4 (four) times daily. 07/26/17  Yes Hollice Espy, MD  HYDROcodone-acetaminophen (NORCO/VICODIN) 5-325 MG tablet Take 1 tablet by mouth every 4 (four) hours as needed for moderate pain. 07/17/17  Yes Birdie Sons, MD  tamsulosin (FLOMAX) 0.4 MG CAPS capsule Take 1 capsule (0.4 mg total) by mouth daily. 04/28/17  Yes Birdie Sons, MD  diphenoxylate-atropine (LOMOTIL) 2.5-0.025 MG tablet Take 1 tablet by mouth 2 (two) times daily as needed for diarrhea or loose stools. Patient not taking: Reported on 07/28/2017 06/19/17   Birdie Sons, MD      PHYSICAL EXAMINATION:   VITAL SIGNS: Blood pressure 118/68, pulse 85, temperature 98 F (36.7 C), temperature source Oral, resp. rate 18, height 5\' 10"  (1.778 m), weight 144 lb (65.3 kg), SpO2 100 %.  GENERAL:  62 y.o.-year-old patient lying in the bed with no acute distress.  EYES: Pupils equal, round, reactive to light and accommodation. No scleral icterus. Extraocular muscles intact.  HEENT: Head atraumatic, normocephalic. Oropharynx and nasopharynx clear.  NECK:  Supple, no jugular venous distention. No thyroid enlargement, no tenderness.  LUNGS: Normal breath sounds bilaterally, no wheezing, rales,rhonchi or crepitation. No use of accessory muscles of respiration.  CARDIOVASCULAR: S1, S2 normal. No murmurs, rubs, or gallops.  ABDOMEN: Soft, nontender, nondistended. Bowel sounds present. No organomegaly or mass.  EXTREMITIES: No pedal edema, cyanosis, or clubbing.  NEUROLOGIC: Cranial nerves II  through XII are intact. Muscle strength 5/5 in all extremities. Sensation intact. Gait not checked.  PSYCHIATRIC: The patient is alert and oriented x 3.  SKIN: No obvious rash, lesion, or ulcer.   LABORATORY PANEL:   CBC Recent Labs  Lab 07/26/17 1409 07/28/17 1046  WBC 7.6 10.5  HGB 9.2* 9.1*  HCT 27.0* 26.2*  PLT 253 292  MCV 89.8 90.1  MCH 30.6 31.5  MCHC 34.1 34.9  RDW 15.0* 15.3*  LYMPHSABS  --  2.3  MONOABS  --  0.7  EOSABS  --  0.3  BASOSABS  --  0.1   ------------------------------------------------------------------------------------------------------------------  Chemistries  Recent Labs  Lab 07/26/17 1409 07/28/17 1046  NA 130* 135  K 4.0 4.5  CL 97* 102  CO2 19* 20*  GLUCOSE 106* 112*  BUN 66* 72*  CREATININE 2.48* 2.52*  CALCIUM 8.2* 8.5*  AST 22 24  ALT 30 38  ALKPHOS 310* 332*  BILITOT 0.5 0.5   ------------------------------------------------------------------------------------------------------------------ estimated creatinine clearance  is 28.1 mL/min (A) (by C-G formula based on SCr of 2.52 mg/dL (H)). ------------------------------------------------------------------------------------------------------------------ No results for input(s): TSH, T4TOTAL, T3FREE, THYROIDAB in the last 72 hours.  Invalid input(s): FREET3   Coagulation profile No results for input(s): INR, PROTIME in the last 168 hours. ------------------------------------------------------------------------------------------------------------------- No results for input(s): DDIMER in the last 72 hours. -------------------------------------------------------------------------------------------------------------------  Cardiac Enzymes No results for input(s): CKMB, TROPONINI, MYOGLOBIN in the last 168 hours.  Invalid input(s): CK ------------------------------------------------------------------------------------------------------------------ Invalid input(s):  POCBNP  ---------------------------------------------------------------------------------------------------------------  Urinalysis    Component Value Date/Time   COLORURINE YELLOW (A) 07/28/2017 1123   APPEARANCEUR CLOUDY (A) 07/28/2017 1123   APPEARANCEUR Cloudy (A) 07/26/2017 1608   LABSPEC 1.012 07/28/2017 1123   PHURINE 5.0 07/28/2017 1123   GLUCOSEU NEGATIVE 07/28/2017 1123   HGBUR MODERATE (A) 07/28/2017 1123   BILIRUBINUR NEGATIVE 07/28/2017 1123   BILIRUBINUR Negative 07/26/2017 1608   KETONESUR NEGATIVE 07/28/2017 1123   PROTEINUR 100 (A) 07/28/2017 1123   UROBILINOGEN 0.2 04/21/2017 1223   NITRITE NEGATIVE 07/28/2017 1123   LEUKOCYTESUR LARGE (A) 07/28/2017 1123   LEUKOCYTESUR 3+ (A) 07/26/2017 1608     RADIOLOGY: Ct Abdomen Pelvis Wo Contrast  Result Date: 07/28/2017 CLINICAL DATA:  Fever. Redness and drainage at nephrostomy tube sites. History of prostate cancer. EXAM: CT ABDOMEN AND PELVIS WITHOUT CONTRAST TECHNIQUE: Multidetector CT imaging of the abdomen and pelvis was performed following the standard protocol without IV contrast. COMPARISON:  CT abdomen pelvis dated April 27, 2017. FINDINGS: Lower chest: No acute abnormality. Hepatobiliary: No focal liver abnormality is seen. No gallstones, gallbladder wall thickening, or biliary dilatation. Pancreas: Unremarkable. No pancreatic ductal dilatation or surrounding inflammatory changes. Spleen: Normal in size without focal abnormality. Adrenals/Urinary Tract: Unchanged left adrenal adenoma. Bilateral nephrostomy tubes in appropriate position. Bilateral double-J ureteral stents in appropriate position. No hydronephrosis. The bladder is decompressed. Stomach/Bowel: Small hiatal hernia. The stomach is otherwise within normal limits. Appendix is normal. Mild colonic stool burden. Unchanged wall thickening of the rectum. No bowel obstruction. Vascular/Lymphatic: Aortic atherosclerosis. Retroperitoneal, common iliac, and external  iliac lymphadenopathy appears stable to slightly decreased in size. For example, the right external iliac lymph node now measures 2.0 cm in short axis, previously 3.2 cm. Reproductive: Prostatomegaly, unchanged. Other: Abnormal soft tissue stranding in the retroperitoneum, pelvic sidewalls, and perirectal and presacral spaces is similar to prior study. No pneumoperitoneum or free fluid. Musculoskeletal: New diffuse bony heterogeneity of the visualized thoracolumbar spine and pelvis, concerning for osseous metastatic disease. New Schmorl's node versus small superior endplate compression deformity along the right aspect of the L4 vertebral body. IMPRESSION: 1. Appropriately positioned bilateral nephrostomy tubes and double-J ureteral stents. No hydronephrosis. No soft tissue inflammation or fluid collection surrounding the nephrostomy tubes at the skin surface. 2. New diffuse bony heterogeneity of the visualized thoracolumbar spine and pelvis, concerning for osseous metastatic disease. 3. New Schmorl's node versus small superior endplate compression deformity along the right aspect of the L4 vertebral body. 4. Stable to interval decrease in size of retroperitoneal, common iliac, and external iliac lymphadenopathy. 5. Relatively unchanged abnormal soft tissue stranding in the retroperitoneum, pelvic sidewalls, and perirectal and presacral spaces. 6.  Aortic atherosclerosis (ICD10-I70.0). Electronically Signed   By: Titus Dubin M.D.   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 Normal heart size and  vascularity. Chronic interstitial prominence diffusely. No focal collapse, consolidation, pneumonia, edema, effusion, or pneumothorax. Stable right hilar fullness suspicious for  right hilar adenopathy. 1.9 cm right upper lobe irregular nodule by CT is grossly stable. IMPRESSION: Stable COPD/emphysema pattern and other chronic findings as above. No superimposed acute process or significant interval change by plain radiography. Electronically Signed   By: Jerilynn Mages.  Shick M.D.   On: 07/28/2017 11:34    EKG: Orders placed or performed during the hospital encounter of 07/28/17  . ED EKG 12-Lead  . ED EKG 12-Lead    IMPRESSION AND PLAN: Patient is a 62 year old male with metastatic prostate cancer presenting with sepsis  1.  Sepsis related to UTI He was noted to recently have staph aureus in his blood sensitivities are currently pending now with UTI and sepsis Will treat with IV vancomycin and Zosyn until further cultures are available  2.  Prostate cancer with metastases with bilateral nephrostomy tubes Urology consult to advise regarding further regarding nephrostomy tubes especially with drainage   3.  Essential hypertension continue Norvasc   4.  Nicotine addiction smoking cessation provided 4 minutes spent strongly recommend he stop smoking nicotine patch will be started    All the records are reviewed and case discussed with ED provider. Management plans discussed with the patient, family and they are in agreement.  CODE STATUS: Code Status History    Date Active Date Inactive Code Status Order ID Comments User Context   06/12/2017 17:57 06/15/2017 18:15 Full Code [id number]  [name], MD Inpatient   06/12/2017 16:54 06/12/2017 16:56 Full Code [id number]  [name], MD HOV       TOTAL TIME TAKING CARE OF THIS PATIENT: 55minutes.    Dustin Flock M.D on 07/28/2017 at 2:05 PM  Between 7am to 6pm - Pager - [phone]  After 6pm go to [email or web address] - password [email or web address] [facility]  Office  [phone]  CC: Primary care physician; Birdie Sons, MD

## 2017-07-28 NOTE — ED Provider Notes (Signed)
[facility] Emergency Department Provider Note  Time seen: 10:46 AM  I have reviewed the triage vital signs and the nursing notes.   HISTORY  Chief Complaint Fever    HPI Michael Small is a 62 y.o. male with a past medical history of CKD, depression, gastric reflux, hypertension, hyperlipidemia, prostate cancer metastatic to the lungs per patient who presents to the emergency department for generalized fatigue and weakness.  According to the wife the patient has bilateral nephrostomy tubes, approximately 1 week ago patient began running fevers.  They placed ureteral stents in addition to the nephrostomy tubes.  Patient followed up on Wednesday in the office was noted to have green discharge from the insertion site of the left nephrostomy tube along with a fever to 102.  At that time the patient refused admission to the hospital per wife.  They sent the patient home on Keflex after giving him a shot of antibiotics in the office.  Patient returned to interventional radiology today for a procedure however patient was noted to be extremely weak, and was sent to the ER for further evaluation, no interventional radiology procedure performed today.  Patient denies any fever today has not taken Tylenol or ibuprofen.  States very mild left back discomfort but denies any significant pain.  He has noted intermittent shortness of breath over the past 1 week.   Past Medical History:  Diagnosis Date  . Chronic kidney disease   . Depression   . GERD (gastroesophageal reflux disease)   . Hyperlipidemia   . Hypertension   . Myocardial infarction [facility])    s/p stent  . Neuromuscular disorder (HCC)    numbness in arms and legs  . Prostate cancer [facility])     Patient Active Problem List   Diagnosis Date Noted  . Goals of care, counseling/discussion 06/29/2017  . Metastasis to bone ([place]) 06/23/2017  . Weight loss 06/17/2017  . Renal insufficiency 06/17/2017  . Prostate cancer  ([place]) 06/16/2017  . Protein-calorie malnutrition, severe 06/13/2017  . Hyperkalemia 06/12/2017  . Acute kidney injury ([place]) 04/21/2017  . Lymphedema of right lower extremity 04/17/2017  . Diarrhea 04/17/2017  . Emphysema lung ([place]) 12/02/2016  . Bursitis, shoulder 08/13/2015  . Arthritis 05/08/2015  . Atherosclerosis of coronary artery 05/08/2015  . Depression 05/08/2015  . Heartburn 05/08/2015  . Blood glucose elevated 05/08/2015  . HLD (hyperlipidemia) 05/08/2015  . BP (high blood pressure) 05/08/2015  . Athlete's foot 05/08/2015  . Smoking greater than 30 pack years 05/08/2015    Past Surgical History:  Procedure Laterality Date  . CORONARY ANGIOPLASTY WITH STENT PLACEMENT  12/2011   [facility]  . CYSTOSCOPY W/ RETROGRADES Bilateral 05/24/2017   Procedure: CYSTOSCOPY WITH RETROGRADE PYELOGRAM;  Surgeon: [name], MD;  Location: [facility] ORS;  Service: Urology;  Laterality: Bilateral;  . CYSTOSCOPY WITH STENT PLACEMENT Right 05/24/2017   Procedure: CYSTOSCOPY WITH STENT PLACEMENT;  Surgeon: [name], MD;  Location: [facility] ORS;  Service: Urology;  Laterality: Right;  . IR NEPHROSTOGRAM LEFT THRU EXISTING ACCESS  07/21/2017  . IR NEPHROSTOMY PLACEMENT LEFT  06/15/2017  . IR NEPHROSTOMY PLACEMENT RIGHT  06/14/2017  . IR URETERAL STENT PLACEMENT EXISTING ACCESS LEFT  07/21/2017  . PROSTATE BIOPSY N/A 05/24/2017   Procedure: PROSTATE BIOPSY;  Surgeon: [name], MD;  Location: [facility] ORS;  Service: Urology;  Laterality: N/A;  . US ECHOCARDIOGRAPHY  12/2011   Mild LV dysfunction, EF=45%    Prior to Admission  medications   Medication Sig Start Date End Date Taking? Authorizing Provider  amLODipine (NORVASC) 5 MG tablet TAKE 1 TABLET BY MOUTH EVERY DAY 05/18/17   Birdie Sons, MD  cephALEXin (KEFLEX) 500 MG capsule Take 1 capsule (500 mg total) by mouth 4 (four) times daily. 07/26/17   Hollice Espy, MD  diphenoxylate-atropine (LOMOTIL) 2.5-0.025 MG tablet  Take 1 tablet by mouth 2 (two) times daily as needed for diarrhea or loose stools. 06/19/17   Birdie Sons, MD  HYDROcodone-acetaminophen (NORCO/VICODIN) 5-325 MG tablet Take 1 tablet by mouth every 4 (four) hours as needed for moderate pain. 07/17/17   Birdie Sons, MD  tamsulosin (FLOMAX) 0.4 MG CAPS capsule Take 1 capsule (0.4 mg total) by mouth daily. 04/28/17   Birdie Sons, MD    No Known Allergies  Family History  Problem Relation Age of Onset  . Cancer Mother   . Hypertension Mother   . Diabetes Father   . Heart disease Father   . Cancer Paternal Grandfather   . Prostate cancer Neg Hx   . Bladder Cancer Neg Hx   . Kidney cancer Neg Hx     Social History Social History   Tobacco Use  . Smoking status: Current Every Day Smoker    Packs/day: 1.00    Years: 48.00    Pack years: 48.00    Types: Cigars  . Smokeless tobacco: Never Used  . Tobacco comment: used to amoke 2PPD- lately cut down to 1/2 PPD  Substance Use Topics  . 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 Drug use: No    Review of Systems Constitutional: Fevers high as 102 this past week, none currently.  Positive for generalized weakness and fatigue. Eyes: Negative for visual complaints ENT: Negative for recent illness/congestion Cardiovascular: Negative for chest pain. Respiratory: Admits shortness of breath.  None currently. Gastrointestinal: Negative for abdominal pain, vomiting and diarrhea. Genitourinary: Foul-smelling urine.  Urine continues to drain to bilateral nephrostomy tubes. Musculoskeletal: Negative for musculoskeletal complaints Skin: Negative for skin complaints  Neurological: Negative for headache All other ROS negative  ____________________________________________   PHYSICAL EXAM:  VITAL SIGNS: ED Triage Vitals  Enc Vitals Group     BP 07/28/17 1043 (!) 121/58     Pulse Rate 07/28/17 1043 93     Resp  07/28/17 1043 18     Temp 07/28/17 1043 98 F (36.7 C)     Temp Source 07/28/17 1043 Oral     SpO2 07/28/17 1043 100 %     Weight 07/28/17 1045 144 lb (65.3 kg)     Height 07/28/17 1045 5\' 10"  (1.778 m)     Head Circumference --      Peak Flow --      Pain Score 07/28/17 1044 8     Pain Loc --      Pain Edu? --      Excl. in [place]? --    Constitutional: Alert and oriented.  Thin/cachectic. Eyes: Normal exam ENT   Head: Normocephalic and atraumatic.   Mouth/Throat: Mucous membranes are moist. Cardiovascular: Normal rate, regular rhythm. Respiratory: Normal respiratory effort without tachypnea nor retractions. Breath sounds are clear  Gastrointestinal: Soft and nontender. No distention.  No significant CVA tenderness.  Patient has nephrostomy tubes bilaterally.  Very minimal drainage from the left nephrostomy tube, well-appearing right nephrostomy tube.  Mild erythema surrounding  the left nephrostomy tube insertion site. Musculoskeletal: Nontender with normal range of motion in all extremities. No lower extremity tenderness or edema. Neurologic:  Normal speech and language. No gross focal neurologic deficits Skin:  Skin is warm, dry and intact.  Psychiatric: Mood and affect are normal. Speech and behavior are normal.   ____________________________________________    EKG  EKG reviewed and interpreted by myself shows normal sinus rhythm at 94 bpm with a narrow QRS, normal axis, normal intervals, nonspecific ST changes  ____________________________________________    RADIOLOGY  Chest x-ray shows stable findings. CT scan of the abdomen shows no acute abnormality besides likely new metastatic disease.  No signs of abscess or hydronephrosis.  No sign of fluid collection/cellulitis  ____________________________________________   INITIAL IMPRESSION / ASSESSMENT AND PLAN / ED COURSE  Pertinent labs & imaging results that were available during my care of the patient were  reviewed by me and considered in my medical decision making (see chart for details).  Patient presents to the emergency department for generalized fatigue weakness and intermittent fever with drainage to the left nephrostomy tube.  Differential would include sepsis, infection, renal abscess, urinary tract infection, electrolyte/metabolic abnormality.  We will check labs, blood cultures, lactic acid, will obtain a chest x-ray as well as CT imaging of the abdomen without contrast.  We will continue to closely monitor the patient in the emergency department.  Given the fevers high as 102 in the past 48 hours we will cover with IV antibiotics while awaiting lab results.  His labs are resulted showing a significant urinary tract infection, but otherwise largely at baseline with chronic kidney disease.  I reviewed the patient's records however he had Staphylococcus growing in his blood culture from 4 days ago.  I discussed with urology, they stated at that time they told the patient he would need to be admitted to the hospital for IV antibiotics and likely PICC line, however with the patient's advanced prostate cancer he refused admission at that time.  Now he has become very weak having difficulty getting up, states he felt like he was going to pass out when he stood up to urinate.  We will continue with IV antibiotics and admit to the hospitalist service.  Patient agreeable to this plan of care.  ____________________________________________   FINAL CLINICAL IMPRESSION(S) / ED DIAGNOSES  Generalized weakness Fever Staphylococcus blood culture positive    Harvest Dark, MD 07/28/17 1319

## 2017-07-28 NOTE — Progress Notes (Signed)
Pharmacy Antibiotic Note  Michael Small is a 62 y.o. male admitted on 07/28/2017 with sepsis. Blood cultures from 2/27 show Staph aureus and new BCx have been collected. Pharmacy has been consulted for cefazolin dosing.  Pt received Zosyn 3.375 g IV x1 and vancomycin 1000 mg IV x1  Plan: Will start Cefazolin 1g Q12H. Pharmacy will continue to follow.   Height: 5\' 10"  (177.8 cm) Weight: 144 lb (65.3 kg) IBW/kg (Calculated) : 73  Temp (24hrs), Avg:98 F (36.7 C), Min:98 F (36.7 C), Max:98 F (36.7 C)  Recent Labs  Lab 07/26/17 1409 07/28/17 1046  WBC 7.6 10.5  CREATININE 2.48* 2.52*  LATICACIDVEN  --  1.3    Estimated Creatinine Clearance: 28.1 mL/min (A) (by C-G formula based on SCr of 2.52 mg/dL (H)).    No Known Allergies  Antimicrobials this admission: Vancomycin/Zosyn 3/1 >> 3/1  Dose adjustments this admission:   Microbiology results: 3/1 BCx: sent 3/1 UCx: sent    Thank you for allowing pharmacy to be a part of this patient's care.  Lendon Ka, PharmD Pharmacy Resident 07/28/2017 4:11 PM

## 2017-07-28 NOTE — Progress Notes (Signed)
Pharmacy Antibiotic Note  Michael Small is a 62 y.o. male admitted on 07/28/2017 with sepsis.  Pharmacy has been consulted for vancomycin/Zosyn dosing.  Pt received Zosyn 3.375 g IV x1 and vancomycin 1000 mg IV x1  Plan: Zosyn 3.375g IV q8h (4 hour infusion).   Vancomycin 750 mg IV q24h  Ke 0.028, half life 24.7 h, Vd 45.7 L  Height: 5\' 10"  (177.8 cm) Weight: 144 lb (65.3 kg) IBW/kg (Calculated) : 73  Temp (24hrs), Avg:98 F (36.7 C), Min:98 F (36.7 C), Max:98 F (36.7 C)  Recent Labs  Lab 07/26/17 1409 07/28/17 1046  WBC 7.6 10.5  CREATININE 2.48* 2.52*  LATICACIDVEN  --  1.3    Estimated Creatinine Clearance: 28.1 mL/min (A) (by C-G formula based on SCr of 2.52 mg/dL (H)).    No Known Allergies  Antimicrobials this admission: Vancomycin/Zosyn 3/1 >>  Dose adjustments this admission:   Microbiology results: 3/1 BCx: sent 3/1 UCx: sent    Thank you for allowing pharmacy to be a part of this patient's care.  Rocky Morel 07/28/2017 3:46 PM

## 2017-07-28 NOTE — ED Triage Notes (Signed)
Pt sent over from special procedures with reports of fever and redness drainage from nephrostomy sites since Wednesday. Pt has had nephrostomy since January. Current diagnosis of prostate CA also, pt refusing treatment of CA.

## 2017-07-28 NOTE — Progress Notes (Signed)
Patient ID: Michael Small, male   DOB: 1955-11-09, 62 y.o.   MRN: [id number] Patient presented to IR for evaluation of nephrostomy tubes.  Discussed with Dr. Erlene Quan prior to patient arrival and she asked for [place] to cancel the procedure because she doesn't want the tubes manipulated.  Patient has positive blood culture and she recommended that patient go to ED or be admitted to hospital when she saw him 2 days ago.  Patient refused admission at that time.  Today, patient continues to feel bad with fevers and some drainage around left nephrostomy tube.  Both nephrostomy tubes are draining, left side more than right.  We talked about his infection and possible sepsis.  After a lengthy discussion, patient was agreeable to going to the ED.  I spoke with ED physician and we will take patient to ED.

## 2017-07-28 NOTE — Plan of Care (Signed)
  Health Behavior/Discharge Planning: Ability to manage health-related needs will improve 07/28/2017 1633 - Progressing by Darrelyn Hillock, RN   Health Behavior/Discharge Planning: Ability to manage health-related needs will improve 07/28/2017 1633 - Progressing by Darrelyn Hillock, [place] Behavior/Discharge Planning: Ability to manage health-related needs will improve 07/28/2017 1633 - Progressing by Darrelyn Hillock, [place] Behavior/Discharge Planning: Ability to manage health-related needs will improve 07/28/2017 1633 - Progressing by Darrelyn Hillock, [place] Behavior/Discharge Planning: Ability to manage health-related needs will improve 07/28/2017 1633 - Progressing by Darrelyn Hillock, RN   Health Behavior/Discharge Planning: Ability to manage health-related needs will improve 07/28/2017 1633 - Progressing by Darrelyn Hillock, RN

## 2017-07-28 NOTE — ED Notes (Signed)
Attempted to call report, RN stated the room is going to be changed to a new room that is dirty at this time. Floor will call as soon as room is clean.

## 2017-07-28 NOTE — Progress Notes (Signed)
Advanced care plan.  Purpose of the Encounter: CODE STATUS  Parties in Attendance: Patient and his wife  Patient's Decision Capacity: Intact  Subjective/Patient's story: Patient with metastatic prostate cancer being admitted with sepsis   Objective/Medical story I discussed with the patient efforts CPR intubation patient does not want this   Goals of care determination:  DNR   CODE STATUS: DNR   Time spent discussing advanced care planning: 16 minutes

## 2017-07-28 NOTE — Progress Notes (Signed)
Patient here for procedure. Dr. Anselm Pancoast discussed case with Dr. Erlene Quan who recommended that patient be admitted and treated for possible infection/sepsis. After discussing with patient he and wife agreed to plan. Patient wheeled to ER room 24 for further treatment.

## 2017-07-28 NOTE — Consult Note (Signed)
[facility] Infectious Disease     Reason for Consult:MSSA bacteremia, metastatic prostate cancer    Referring Physician:  Dustin Flock Date of Admission:  07/28/2017   Active Problems:   UTI (urinary tract infection)   HPI: Michael Small is a 62 y.o. male with metastatic prostate cancer admitted from IR due to positive [place] with MSSA. He had nephrostomy tubes placed due to bil hydronephrosis and ARF. Has been following as otpt and had some increased drainage from L tube site. Also have fevers and chills. Started keflex as otpt.   On admit wbc 10, no fevers. CT shows nml position of tubes, no abscess.  Past Medical History:  Diagnosis Date  . Chronic kidney disease   . Depression   . GERD (gastroesophageal reflux disease)   . Hyperlipidemia   . Hypertension   . Myocardial infarction [facility])    s/p stent  . Neuromuscular disorder (HCC)    numbness in arms and legs  . Prostate cancer [facility])    Past Surgical History:  Procedure Laterality Date  . CORONARY ANGIOPLASTY WITH STENT PLACEMENT  12/2011   [facility]  . CYSTOSCOPY W/ RETROGRADES Bilateral 05/24/2017   Procedure: CYSTOSCOPY WITH RETROGRADE PYELOGRAM;  Surgeon: [name], MD;  Location: [facility] ORS;  Service: Urology;  Laterality: Bilateral;  . CYSTOSCOPY WITH STENT PLACEMENT Right 05/24/2017   Procedure: CYSTOSCOPY WITH STENT PLACEMENT;  Surgeon: [name], MD;  Location: [facility] ORS;  Service: Urology;  Laterality: Right;  . IR NEPHROSTOGRAM LEFT THRU EXISTING ACCESS  07/21/2017  . IR NEPHROSTOMY PLACEMENT LEFT  06/15/2017  . IR NEPHROSTOMY PLACEMENT RIGHT  06/14/2017  . IR URETERAL STENT PLACEMENT EXISTING ACCESS LEFT  07/21/2017  . PROSTATE BIOPSY N/A 05/24/2017   Procedure: PROSTATE BIOPSY;  Surgeon: [name], MD;  Location: [facility] ORS;  Service: Urology;  Laterality: N/A;  . US ECHOCARDIOGRAPHY  12/2011   Mild LV dysfunction, EF=45%   Social History   Tobacco Use  . Smoking status: Current  Every Day Smoker    Packs/day: 1.00    Years: 48.00    Pack years: 48.00    Types: Cigars  . Smokeless tobacco: Never Used  . Tobacco comment: used to amoke 2PPD- lately cut down to 1/2 PPD  Substance Use Topics  . 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 Drug use: No   Family History  Problem Relation Age of Onset  . Cancer Mother   . Hypertension Mother   . Diabetes Father   . Heart disease Father   . Cancer Paternal Grandfather   . Prostate cancer Neg Hx   . Bladder Cancer Neg Hx   . Kidney cancer Neg Hx     Allergies: No Known Allergies  Current antibiotics: Antibiotics Given (last 72 hours)    Date/Time Action Medication Dose Rate   07/28/17 1131 New Bag/Given   piperacillin-tazobactam (ZOSYN) IVPB 3.375 g 3.375 g 100 mL/hr   07/28/17 1200 New Bag/Given   vancomycin (VANCOCIN) IVPB 1000 mg/200 mL premix 1,000 mg 200 mL/hr      MEDICATIONS: . [START ON 07/29/2017] amLODipine  5 mg Oral Daily  . heparin  5,000 Units Subcutaneous Q8H  . nicotine  21 mg Transdermal Daily  . nicotine  21 mg Transdermal Daily  . [START ON 07/29/2017] tamsulosin  0.4 mg Oral Daily    Review of  Systems - 11 systems reviewed and negative per HPI   OBJECTIVE: Temp:  [98 F (36.7 C)] 98 F (36.7 C) (03/01 1511) Pulse Rate:  [85-93] 89 (03/01 1511) Resp:  [16-20] 20 (03/01 1511) BP: (109-124)/(56-68) 124/56 (03/01 1511) SpO2:  [96 %-100 %] 100 % (03/01 1511) Weight:  [65.3 kg (144 lb)] 65.3 kg (144 lb) (03/01 1045) Physical Exam  Constitutional: He is oriented to person, place, and time. thinHENT:  Mouth/Throat: Oropharynx is clear and moist. No oropharyngeal exudate.  Cardiovascular: Normal rate, regular rhythm and normal heart sounds.  Pulmonary/Chest: Effort normal and breath sounds normal. No respiratory distress. He has no wheezes.  Abdominal: Soft. Bowel sounds are normal. He exhibits no distension. There is no  tenderness.  Lymphadenopathy: He has no cervical adenopathy.  Neurological: He is alert and oriented to person, place, and time.  Skin: Skin is warm and dry. L nephrstomy tube site with small amt purulence able to be expressed Psychiatric: He has a normal mood and affect. His behavior is normal.   LABS: Results for orders placed or performed during the hospital encounter of 07/28/17 (from the past 48 hour(s))  Comprehensive metabolic panel     Status: Abnormal   Collection Time: 07/28/17 10:46 AM  Result Value Ref Range   Sodium 135 135 - 145 mmol/L   Potassium 4.5 3.5 - 5.1 mmol/L   Chloride 102 101 - 111 mmol/L   CO2 20 (L) 22 - 32 mmol/L   Glucose, Bld 112 (H) 65 - 99 mg/dL   BUN 72 (H) 6 - 20 mg/dL   Creatinine, Ser 2.52 (H) 0.61 - 1.24 mg/dL   Calcium 8.5 (L) 8.9 - 10.3 mg/dL   Total Protein 7.2 6.5 - 8.1 g/dL   Albumin 2.5 (L) 3.5 - 5.0 g/dL   AST 24 15 - 41 U/L   ALT 38 17 - 63 U/L   Alkaline Phosphatase 332 (H) 38 - 126 U/L   Total Bilirubin 0.5 0.3 - 1.2 mg/dL   GFR calc non Af Amer 26 (L) >60 mL/min   GFR calc Af Amer 30 (L) >60 mL/min    Comment: (NOTE) The eGFR has been calculated using the CKD EPI equation. This calculation has not been validated in all clinical situations. eGFR's persistently <60 mL/min signify possible Chronic Kidney Disease.    Anion gap 13 5 - 15    Comment: Performed at [facility], [address] [place], [place] [address]  CBC WITH DIFFERENTIAL     Status: Abnormal   Collection Time: 07/28/17 10:46 AM  Result Value Ref Range   WBC 10.5 3.8 - 10.6 K/uL   RBC 2.91 (L) 4.40 - 5.90 MIL/uL   Hemoglobin 9.1 (L) 13.0 - 18.0 g/dL   HCT 26.2 (L) 40.0 - 52.0 %   MCV 90.1 80.0 - 100.0 fL   MCH 31.5 26.0 - 34.0 pg   MCHC 34.9 32.0 - 36.0 g/dL   RDW 15.3 (H) 11.5 - 14.5 %   Platelets 292 150 - 440 K/uL   Neutrophils Relative % 62 %   Lymphocytes Relative 22 %   Monocytes Relative 7 %   Eosinophils Relative 3 %   Basophils Relative  1 %   Band Neutrophils 2 %   Metamyelocytes Relative 1 %   Myelocytes 2 %   Promyelocytes Absolute 0 %   Blasts 0 %   nRBC 0 0 /100 WBC   Other 0 %   Neutro Abs 7.1 (H) 1.4 -  6.5 K/uL   Lymphs Abs 2.3 1.0 - 3.6 K/uL   Monocytes Absolute 0.7 0.2 - 1.0 K/uL   Eosinophils Absolute 0.3 0 - 0.7 K/uL   Basophils Absolute 0.1 0 - 0.1 K/uL   RBC Morphology MIXED RBC POPULATION     Comment: Performed at [facility], [address] [place], [place] [address]  Lactic acid, plasma     Status: None   Collection Time: 07/28/17 10:46 AM  Result Value Ref Range   Lactic Acid, Venous 1.3 0.5 - 1.9 mmol/L    Comment: Performed at [facility], [address] [place], [place] [address]  Urinalysis, Routine w reflex microscopic     Status: Abnormal   Collection Time: 07/28/17 11:23 AM  Result Value Ref Range   Color, Urine YELLOW (A) YELLOW   APPearance CLOUDY (A) CLEAR   Specific Gravity, Urine 1.012 1.005 - 1.030   pH 5.0 5.0 - 8.0   Glucose, UA NEGATIVE NEGATIVE mg/dL   Hgb urine dipstick MODERATE (A) NEGATIVE   Bilirubin Urine NEGATIVE NEGATIVE   Ketones, ur NEGATIVE NEGATIVE mg/dL   Protein, ur 100 (A) NEGATIVE mg/dL   Nitrite NEGATIVE NEGATIVE   Leukocytes, UA LARGE (A) NEGATIVE   RBC / HPF TOO NUMEROUS TO COUNT 0 - 5 RBC/hpf   WBC, UA TOO NUMEROUS TO COUNT 0 - 5 WBC/hpf   Bacteria, UA NONE SEEN NONE SEEN   Squamous Epithelial / LPF NONE SEEN NONE SEEN   WBC Clumps PRESENT     Comment: Performed at [facility], [address] [place], [place] [address]   No components found for: ESR, C REACTIVE PROTEIN MICRO: Recent Results (from the past 720 hour(s))  CULTURE, BLOOD (ROUTINE X 2) w Reflex to ID Panel     Status: Abnormal (Preliminary result)   Collection Time: 07/26/17  2:09 PM  Result Value Ref Range Status   Specimen Description   Final    BLOOD LEFT ANTECUBITAL Performed at [facility], [address], [place], [place] [address]     Special Requests   Final    BOTTLES DRAWN AEROBIC AND ANAEROBIC Blood Culture adequate volume Performed at [facility], [address] [place], [place] [address]    Culture  Setup Time   Final    GRAM POSITIVE COCCI IN BOTH AEROBIC AND ANAEROBIC BOTTLES CRITICAL RESULT CALLED TO, READ BACK BY AND VERIFIED WITH: MICHELLE BELL AT 0945, 07/27/17 SDR    Culture (A)  Final    STAPHYLOCOCCUS AUREUS SUSCEPTIBILITIES TO FOLLOW Performed at [place] [address] [place], [place] [address]    Report Status PENDING  Incomplete  CULTURE, BLOOD (ROUTINE X 2) w Reflex to ID Panel     Status: Abnormal (Preliminary result)   Collection Time: 07/26/17  2:09 PM  Result Value Ref Range Status   Specimen Description   Final    BLOOD RIGHT ANTECUBITAL Performed at [facility], [address], [place], [place] [address]    Special Requests   Final    BOTTLES DRAWN AEROBIC AND ANAEROBIC Blood Culture adequate volume Performed at [facility], [address], [place], [place] [address]    Culture  Setup Time   Final    GRAM POSITIVE COCCI IN BOTH AEROBIC AND ANAEROBIC BOTTLES CRITICAL VALUE NOTED.  VALUE IS CONSISTENT WITH PREVIOUSLY REPORTED AND CALLED VALUE. Performed at [facility], [address] [place], [place] [address]    Culture  STAPHYLOCOCCUS AUREUS (A)  Final   Report Status PENDING  Incomplete  Blood Culture ID Panel (Reflexed)     Status: Abnormal   Collection Time: 07/26/17  2:09 PM  Result Value Ref Range Status   Enterococcus species NOT DETECTED NOT DETECTED Final   Listeria monocytogenes NOT DETECTED NOT DETECTED Final   Staphylococcus species DETECTED (A) NOT DETECTED Final    Comment: CRITICAL RESULT CALLED TO, READ BACK BY AND VERIFIED WITH: MICHELLE BELL AT 0945 07/27/17 SDR    Staphylococcus aureus DETECTED (A) NOT DETECTED Final    Comment: Methicillin (oxacillin) susceptible Staphylococcus aureus (MSSA).  Preferred therapy is anti staphylococcal beta lactam antibiotic (Cefazolin or Nafcillin), unless clinically contraindicated. CRITICAL RESULT CALLED TO, READ BACK BY AND VERIFIED WITH: MICHELLE BELL AT 0945 07/27/17 SDR    Methicillin resistance NOT DETECTED NOT DETECTED Final   Streptococcus species NOT DETECTED NOT DETECTED Final   Streptococcus agalactiae NOT DETECTED NOT DETECTED Final   Streptococcus pneumoniae NOT DETECTED NOT DETECTED Final   Streptococcus pyogenes NOT DETECTED NOT DETECTED Final   Acinetobacter baumannii NOT DETECTED NOT DETECTED Final   Enterobacteriaceae species NOT DETECTED NOT DETECTED Final   Enterobacter cloacae complex NOT DETECTED NOT DETECTED Final   Escherichia coli NOT DETECTED NOT DETECTED Final   Klebsiella oxytoca NOT DETECTED NOT DETECTED Final   Klebsiella pneumoniae NOT DETECTED NOT DETECTED Final   Proteus species NOT DETECTED NOT DETECTED Final   Serratia marcescens NOT DETECTED NOT DETECTED Final   Haemophilus influenzae NOT DETECTED NOT DETECTED Final   Neisseria meningitidis NOT DETECTED NOT DETECTED Final   Pseudomonas aeruginosa NOT DETECTED NOT DETECTED Final   Candida albicans NOT DETECTED NOT DETECTED Final   Candida glabrata NOT DETECTED NOT DETECTED Final   Candida krusei NOT DETECTED NOT DETECTED Final   Candida parapsilosis NOT DETECTED NOT DETECTED Final   Candida tropicalis NOT DETECTED NOT DETECTED Final    Comment: Performed at [facility], [address] [place], [place] [address]  Microscopic Examination     Status: Abnormal   Collection Time: 07/26/17  4:08 PM  Result Value Ref Range Status   WBC, UA >30 (H) 0 - 5 /hpf Final   RBC, UA >30 (H) 0 - 2 /hpf Final   Epithelial Cells (non renal) 0-10 0 - 10 /hpf Final   Mucus, UA Present (A) Not Estab. Final   Bacteria, UA Many (A) None seen/Few Final  CULTURE, URINE COMPREHENSIVE     Status: None (Preliminary result)   Collection Time: 07/26/17  4:32 PM  Result  Value Ref Range Status   Urine Culture, Comprehensive Preliminary report  Preliminary   Organism ID, Bacteria Comment  Preliminary    Comment: Microbiological testing to rule out the presence of possible pathogens is in progress. Greater than 100,000 colony forming units per mL     IMAGING: Ct Abdomen Pelvis Wo Contrast  Result Date: 07/28/2017 CLINICAL DATA:  Fever. Redness and drainage at nephrostomy tube sites. History of prostate cancer. EXAM: CT ABDOMEN AND PELVIS WITHOUT CONTRAST TECHNIQUE: Multidetector CT imaging of the abdomen and pelvis was performed following the standard protocol without IV contrast. COMPARISON:  CT abdomen pelvis dated April 27, 2017. FINDINGS: Lower chest: No acute abnormality. Hepatobiliary: No focal liver abnormality is seen. No gallstones, gallbladder wall thickening, or biliary dilatation. Pancreas: Unremarkable. No pancreatic ductal dilatation or surrounding inflammatory changes. Spleen: Normal in size without focal abnormality. Adrenals/Urinary Tract: Unchanged left adrenal adenoma. Bilateral nephrostomy tubes in appropriate position.  Bilateral double-J ureteral stents in appropriate position. No hydronephrosis. The bladder is decompressed. Stomach/Bowel: Small hiatal hernia. The stomach is otherwise within normal limits. Appendix is normal. Mild colonic stool burden. Unchanged wall thickening of the rectum. No bowel obstruction. Vascular/Lymphatic: Aortic atherosclerosis. Retroperitoneal, common iliac, and external iliac lymphadenopathy appears stable to slightly decreased in size. For example, the right external iliac lymph node now measures 2.0 cm in short axis, previously 3.2 cm. Reproductive: Prostatomegaly, unchanged. Other: Abnormal soft tissue stranding in the retroperitoneum, pelvic sidewalls, and perirectal and presacral spaces is similar to prior study. No pneumoperitoneum or free fluid. Musculoskeletal: New diffuse bony heterogeneity of the visualized  thoracolumbar spine and pelvis, concerning for osseous metastatic disease. New Schmorl's node versus small superior endplate compression deformity along the right aspect of the L4 vertebral body. IMPRESSION: 1. Appropriately positioned bilateral nephrostomy tubes and double-J ureteral stents. No hydronephrosis. No soft tissue inflammation or fluid collection surrounding the nephrostomy tubes at the skin surface. 2. New diffuse bony heterogeneity of the visualized thoracolumbar spine and pelvis, concerning for osseous metastatic disease. 3. New Schmorl's node versus small superior endplate compression deformity along the right aspect of the L4 vertebral body. 4. Stable to interval decrease in size of retroperitoneal, common iliac, and external iliac lymphadenopathy. 5. Relatively unchanged abnormal soft tissue stranding in the retroperitoneum, pelvic sidewalls, and perirectal and presacral spaces. 6.  Aortic atherosclerosis (ICD10-I70.0). Electronically Signed   By: Titus Dubin M.D.   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 Normal heart size and vascularity. Chronic interstitial prominence diffusely. No focal collapse, consolidation, pneumonia, edema, effusion, or pneumothorax. Stable right hilar fullness suspicious for right hilar adenopathy. 1.9 cm right upper lobe irregular nodule by CT is grossly stable. IMPRESSION: Stable COPD/emphysema pattern and other chronic findings as above. No superimposed acute process or significant interval change by plain radiography. Electronically Signed   By: Jerilynn Mages.  Shick M.D.   On: 07/28/2017 11:34   Ir Nephrostogram Left Thru Existing Access  Result Date: 07/21/2017 CLINICAL DATA:  History metastatic prostate cancer with  obstructive bilateral uropathy post failed right-sided double-J ureteral stent placement. Post successful ultrasound and fluoroscopic guided placement of bilateral percutaneous nephrostomy catheters on 06/14/2017. Patient presents today for bilateral antegrade nephrostograms with potential placement of left-sided ureteral stent. EXAM: 1. BILATERAL NEPHROSTOGRAM. 2. LEFT-SIDED URETERAL STENT PLACEMENT 3. BILATERAL PERCUTANEOUS NEPHROSTOMY CATHETER EXCHANGE COMPARISON:  Bilateral ultrasound fluoroscopic guided nephrostomy catheter placement - 06/14/2017 MEDICATIONS: Ciprofloxacin 400 mg IV; The antibiotics were administered within 1 hour of the procedure. ANESTHESIA/SEDATION: Moderate (conscious) sedation was employed during this procedure. A total of Versed 1 mg and Fentanyl 50 mcg was administered intravenously. Moderate Sedation Time: 22 minutes. The patient's level of consciousness and vital signs were monitored continuously by radiology nursing throughout the procedure under my direct supervision. CONTRAST:  A total of 50 cc Isovue-300 was administered into the bilateral collecting systems. FLUOROSCOPY TIME:  4 minutes (40 mGy) COMPLICATIONS: None immediate PROCEDURE: The procedure, risks, benefits, and alternatives were explained to the patient. Questions regarding the procedure were encouraged and answered. The patient understands and consents to the procedure. The nephrostomy tubes and surrounding skin were prepped with Betadine in a sterile fashion, and a sterile drape was applied covering the operative field. A sterile gown and sterile gloves were used  for the procedure. Local anesthesia was provided with 1% Lidocaine. Antegrade nephrostogram was performed via the existing right-sided percutaneous nephrostomy catheter demonstrating patency of the right-sided ureteral stent with passage of contrast through and around the stent to the level of the urinary bladder. The right-sided nephrostomy catheter was  capped Tension was now paid towards the left-sided nephrostomy Initially, a left-sided antegrade nephrostogram was performed via the existing left-sided nephrostomy demonstrated patency of the left ureter to the level of the urinary bladder however no was made of a long segment narrowing/stricture of the distal aspect the left ureter. External portion of the left-sided nephrostomy catheter was cut and the catheter was cannulated with a short Amplatz wire which was coiled within the left renal pelvis. Under intermittent fluoroscopic guidance, the existing nephrostomy catheter was exchanged for a 9 French radiopaque tip vascular sheath was advanced to the level of the left renal pelvis. With the use of a regular glidewire, a Kumpe the catheter was advanced through the left ureter to the level of the urinary bladder. Contrast injection confirmed appropriate positioning. Next, a Amplatz wire was coiled within the urinary bladder and the Kumpe the catheter was utilized for measurement purposes. Under intermittent fluoroscopic guided exchange, the vascular sheath was exchanged for an 8-French, 22 cm ureteral stent was then advanced over an Amplatz superstiff guidewire. The distal portion was formed at the level of the bladder. Proximal portion was then formed at the level of the renal collecting system. At this point, the safety wire was utilized to replace an existing 10.2 [place] percutaneous nephrostomy catheter within the renal collecting system. Postprocedural images were obtained in various obliquities. Left-sided nephrostomy catheter was capped and dressings were applied. The patient tolerated the above procedure well without immediate postprocedural complication. FINDINGS: Nephrostogram demonstrates appropriate position existing bilateral percutaneous nephrostomy catheters. Bilateral nephrostograms demonstrate mild residual hydronephrosis and ureterectasis. There is marked tortuosity of the mid and distal aspect of  the bilateral ureters. No discrete intraluminal filling defects are seen within either collecting system or ureter. A Foley catheter is within the urinary bladder. Bilateral ureteral stent was able to be placed spanning from the collecting system to the bladder. The left ureteral stent is 8 Fr, 28 cm, and the right is 8 Fr, 24 cm. The bilateral 10.2 French nephrostomy catheter were replaced with coils formed and locked within the renal pelvis. IMPRESSION: 1. Successful placement of left-sided 22 cm ureteral stent. 2. Successful fluoroscopic guided exchange of left-sided percutaneous nephrostomy catheter. The left-sided nephrostomy catheter was capped for a trial of internalization. 3. Wide patency of the existing right-sided ureteral stent. The right-sided nephrostomy catheter was capped for a trial of internalization. PLAN: - The bilateral nephrostomy catheters were capped for a trial of internalization. The patient was given 2 gravity bags and instructed to reconnect either one or both of the nephrostomy catheters if he were to develop recurrent flank pain and/or pericatheter leaking as this could herald a sign of stent failure. The patient and the patient's wife demonstrated excellent understanding of this conversation. - The patient will return to the interventional radiology department next week. Assuming he passes this trial of internalization, repeat bilateral antegrade nephrostogram will be performed followed by fluoroscopic guided bilateral nephrostomy catheter removals. Electronically Signed   By: Sandi Mariscal M.D.   On: 07/21/2017 16:22   Ir Ureteral Stent Placement Existing Access Left  Result Date: 07/21/2017 CLINICAL DATA:  History metastatic prostate cancer with obstructive bilateral uropathy post failed right-sided double-J ureteral stent placement.  Post successful ultrasound and fluoroscopic guided placement of bilateral percutaneous nephrostomy catheters on 06/14/2017. Patient presents today for  bilateral antegrade nephrostograms with potential placement of left-sided ureteral stent. EXAM: 1. BILATERAL NEPHROSTOGRAM. 2. LEFT-SIDED URETERAL STENT PLACEMENT 3. BILATERAL PERCUTANEOUS NEPHROSTOMY CATHETER EXCHANGE COMPARISON:  Bilateral ultrasound fluoroscopic guided nephrostomy catheter placement - 06/14/2017 MEDICATIONS: Ciprofloxacin 400 mg IV; The antibiotics were administered within 1 hour of the procedure. ANESTHESIA/SEDATION: Moderate (conscious) sedation was employed during this procedure. A total of Versed 1 mg and Fentanyl 50 mcg was administered intravenously. Moderate Sedation Time: 22 minutes. The patient's level of consciousness and vital signs were monitored continuously by radiology nursing throughout the procedure under my direct supervision. CONTRAST:  A total of 50 cc Isovue-300 was administered into the bilateral collecting systems. FLUOROSCOPY TIME:  4 minutes (40 mGy) COMPLICATIONS: None immediate PROCEDURE: The procedure, risks, benefits, and alternatives were explained to the patient. Questions regarding the procedure were encouraged and answered. The patient understands and consents to the procedure. The nephrostomy tubes and surrounding skin were prepped with Betadine in a sterile fashion, and a sterile drape was applied covering the operative field. A sterile gown and sterile gloves were used for the procedure. Local anesthesia was provided with 1% Lidocaine. Antegrade nephrostogram was performed via the existing right-sided percutaneous nephrostomy catheter demonstrating patency of the right-sided ureteral stent with passage of contrast through and around the stent to the level of the urinary bladder. The right-sided nephrostomy catheter was capped Tension was now paid towards the left-sided nephrostomy Initially, a left-sided antegrade nephrostogram was performed via the existing left-sided nephrostomy demonstrated patency of the left ureter to the level of the urinary bladder  however no was made of a long segment narrowing/stricture of the distal aspect the left ureter. External portion of the left-sided nephrostomy catheter was cut and the catheter was cannulated with a short Amplatz wire which was coiled within the left renal pelvis. Under intermittent fluoroscopic guidance, the existing nephrostomy catheter was exchanged for a 9 French radiopaque tip vascular sheath was advanced to the level of the left renal pelvis. With the use of a regular glidewire, a Kumpe the catheter was advanced through the left ureter to the level of the urinary bladder. Contrast injection confirmed appropriate positioning. Next, a Amplatz wire was coiled within the urinary bladder and the Kumpe the catheter was utilized for measurement purposes. Under intermittent fluoroscopic guided exchange, the vascular sheath was exchanged for an 8-French, 22 cm ureteral stent was then advanced over an Amplatz superstiff guidewire. The distal portion was formed at the level of the bladder. Proximal portion was then formed at the level of the renal collecting system. At this point, the safety wire was utilized to replace an existing 10.2 [place] percutaneous nephrostomy catheter within the renal collecting system. Postprocedural images were obtained in various obliquities. Left-sided nephrostomy catheter was capped and dressings were applied. The patient tolerated the above procedure well without immediate postprocedural complication. FINDINGS: Nephrostogram demonstrates appropriate position existing bilateral percutaneous nephrostomy catheters. Bilateral nephrostograms demonstrate mild residual hydronephrosis and ureterectasis. There is marked tortuosity of the mid and distal aspect of the bilateral ureters. No discrete intraluminal filling defects are seen within either collecting system or ureter. A Foley catheter is within the urinary bladder. Bilateral ureteral stent was able to be placed spanning from the collecting  system to the bladder. The left ureteral stent is 8 Fr, 28 cm, and the right is 8 Fr, 24 cm. The bilateral 10.2 French nephrostomy catheter were replaced  with coils formed and locked within the renal pelvis. IMPRESSION: 1. Successful placement of left-sided 22 cm ureteral stent. 2. Successful fluoroscopic guided exchange of left-sided percutaneous nephrostomy catheter. The left-sided nephrostomy catheter was capped for a trial of internalization. 3. Wide patency of the existing right-sided ureteral stent. The right-sided nephrostomy catheter was capped for a trial of internalization. PLAN: - The bilateral nephrostomy catheters were capped for a trial of internalization. The patient was given 2 gravity bags and instructed to reconnect either one or both of the nephrostomy catheters if he were to develop recurrent flank pain and/or pericatheter leaking as this could herald a sign of stent failure. The patient and the patient's wife demonstrated excellent understanding of this conversation. - The patient will return to the interventional radiology department next week. Assuming he passes this trial of internalization, repeat bilateral antegrade nephrostogram will be performed followed by fluoroscopic guided bilateral nephrostomy catheter removals. Electronically Signed   By: Sandi Mariscal M.D.   On: 07/21/2017 16:22    Assessment:   Michael Small is a 62 y.o. male with MSSA bacteremia with likely source his L nephrostomy tube. He has no evidence of metastatic infection at this time. He has repeat bcx pending and has started ancef. There is a small amt of purulence at his L tube site so this will have to be changed at some point.  Discussed treatment of SAB - will need echo, TEE if TTE neg, repeat bcx (pending) then PICC and min 2 week IV ancef. Discussed he will likely be here through Monday as that would be earliest we can get a TEE.  Recommendations Change to ancef. I have cxed site of drainage on L tube.  Will eventually need this exchanged but defer timing to IR and urology. Echo ordered - if neg will need TEE.  If TTE negative then please inform cardiology [facility]) on Sunday so he can have a TEE scheduled for Monday morning. If [place] neg 3/3 then place a PICC please Thank you very much for allowing me to participate in the care of this patient. Please call with questions.   Cheral Marker. Ola Spurr, MD

## 2017-07-29 ENCOUNTER — Inpatient Hospital Stay
Admit: 2017-07-29 | Discharge: 2017-07-29 | Disposition: A | Discharge status: Home / Self Care | Payer: Medicare Other | Attending: Infectious Diseases | Admitting: Infectious Diseases

## 2017-07-29 LAB — ECHOCARDIOGRAM COMPLETE
HEIGHTINCHES: 70 in
WEIGHTICAEL: 2304 [oz_av]

## 2017-07-29 LAB — BASIC METABOLIC PANEL
ANION GAP: 9 (ref 5–15)
BUN: 58 mg/dL — ABNORMAL HIGH (ref 6–20)
CHLORIDE: 111 mmol/L (ref 101–111)
CO2: 18 mmol/L — ABNORMAL LOW (ref 22–32)
Calcium: 8 mg/dL — ABNORMAL LOW (ref 8.9–10.3)
Creatinine, Ser: 2.27 mg/dL — ABNORMAL HIGH (ref 0.61–1.24)
GFR calc Af Amer: 34 mL/min — ABNORMAL LOW (ref >60)
GFR, EST NON AFRICAN AMERICAN: 29 mL/min — AB (ref >60)
Glucose, Bld: 105 mg/dL — ABNORMAL HIGH (ref 65–99)
POTASSIUM: 4.7 mmol/L (ref 3.5–5.1)
SODIUM: 138 mmol/L (ref 135–145)

## 2017-07-29 LAB — CULTURE, BLOOD (ROUTINE X 2)
Special Requests: ADEQUATE
Special Requests: ADEQUATE

## 2017-07-29 LAB — CBC
HCT: 23.2 % — ABNORMAL LOW (ref 40.0–52.0)
HEMOGLOBIN: 7.7 g/dL — AB (ref 13.0–18.0)
MCH: 29.8 pg (ref 26.0–34.0)
MCHC: 33.2 g/dL (ref 32.0–36.0)
MCV: 90 fL (ref 80.0–100.0)
PLATELETS: 229 10*3/uL (ref 150–440)
RBC: 2.57 MIL/uL — AB (ref 4.40–5.90)
RDW: 15.4 % — ABNORMAL HIGH (ref 11.5–14.5)
WBC: 7.5 10*3/uL (ref 3.8–10.6)

## 2017-07-29 LAB — URINE CULTURE: Culture: <10000 — AB

## 2017-07-29 MED ORDER — FENTANYL 25 MCG/HR TD PT72
25.0000 ug | MEDICATED_PATCH | TRANSDERMAL | Status: DC
  Administered 2017-07-29: 25 ug via TRANSDERMAL
  Filled 2017-07-29: qty 1

## 2017-07-29 MED ORDER — DIPHENOXYLATE-ATROPINE 2.5-0.025 MG PO TABS
1.0000 | ORAL_TABLET | Freq: Every day | ORAL | Status: DC
  Administered 2017-07-29 – 2017-07-30 (×2): 1 via ORAL
  Filled 2017-07-29 (×2): qty 1

## 2017-07-29 NOTE — Progress Notes (Signed)
*  PRELIMINARY RESULTS* Echocardiogram 2D Echocardiogram has been performed.  Michael Small Shailene Demonbreun 07/29/2017, 12:01 PM

## 2017-07-29 NOTE — Consult Note (Signed)
[facility] Cardiology  CARDIOLOGY CONSULT NOTE  Patient ID: Michael Small MRN: [id number] DOB/AGE: 1955/10/11 62 y.o.  Admit date: 07/28/2017 Referring Physician Marthann Schiller Primary Physician [name] Primary Cardiologist  Reason for Consultation bacteremia  HPI: 62 year old gentleman referred for evaluation of bacteremia.  Patient has known metastatic prostate cancer, status post bilateral nephrostomy tubes, with recent fever and chills, blood cultures positive for methicillin sensitive staph aureus bacteremia.  2D echocardiogram revealed normal left ventricular function, with mild mitral regurgitation, without evidence for vegetation.  Patient evaluated by Dr. Ola Spurr, who recommends transesophageal echocardiogram to rule out SBE.  Review of systems complete and found to be negative unless listed above     Past Medical History:  Diagnosis Date  . Chronic kidney disease   . Depression   . GERD (gastroesophageal reflux disease)   . Hyperlipidemia   . Hypertension   . Myocardial infarction [facility])    s/p stent  . Neuromuscular disorder (HCC)    numbness in arms and legs  . Prostate cancer [facility])     Past Surgical History:  Procedure Laterality Date  . CORONARY ANGIOPLASTY WITH STENT PLACEMENT  12/2011   [facility] Cardiology  . CYSTOSCOPY W/ RETROGRADES Bilateral 05/24/2017   Procedure: CYSTOSCOPY WITH RETROGRADE PYELOGRAM;  Surgeon: [name], MD;  Location: [facility] ORS;  Service: Urology;  Laterality: Bilateral;  . CYSTOSCOPY WITH STENT PLACEMENT Right 05/24/2017   Procedure: CYSTOSCOPY WITH STENT PLACEMENT;  Surgeon: [name], MD;  Location: [facility] ORS;  Service: Urology;  Laterality: Right;  . IR NEPHROSTOGRAM LEFT THRU EXISTING ACCESS  07/21/2017  . IR NEPHROSTOMY PLACEMENT LEFT  06/15/2017  . IR NEPHROSTOMY PLACEMENT RIGHT  06/14/2017  . IR URETERAL STENT PLACEMENT EXISTING ACCESS LEFT  07/21/2017  . PROSTATE BIOPSY N/A 05/24/2017   Procedure: PROSTATE BIOPSY;  Surgeon:  [name], MD;  Location: [facility] ORS;  Service: Urology;  Laterality: N/A;  . US ECHOCARDIOGRAPHY  12/2011   Mild LV dysfunction, EF=45%    Medications Prior to Admission  Medication Sig Dispense Refill Last Dose  . amLODipine (NORVASC) 5 MG tablet TAKE 1 TABLET BY MOUTH EVERY DAY 30 tablet 5 07/28/2017 at Unknown time  . cephALEXin (KEFLEX) 500 MG capsule Take 1 capsule (500 mg total) by mouth 4 (four) times daily. 28 capsule 0 07/28/2017 at Unknown time  . HYDROcodone-acetaminophen (NORCO/VICODIN) 5-325 MG tablet Take 1 tablet by mouth every 4 (four) hours as needed for moderate pain. 120 tablet 0 07/28/2017 at Unknown time  . tamsulosin (FLOMAX) 0.4 MG CAPS capsule Take 1 capsule (0.4 mg total) by mouth daily. 30 capsule 3 07/28/2017 at Unknown time  . diphenoxylate-atropine (LOMOTIL) 2.5-0.025 MG tablet Take 1 tablet by mouth 2 (two) times daily as needed for diarrhea or loose stools. (Patient not taking: Reported on 07/28/2017) 30 tablet 3 Not Taking at Unknown time   Social History   Socioeconomic History  . Marital status: Married    Spouse name: Not on file  . Number of children: Not on file  . Years of education: 32  . Highest education level: Not on file  Social Needs  . Financial resource strain: Not on file  . Food insecurity - worry: Not on file  . Food insecurity - inability: Not on file  . Transportation needs - medical: Not on file  . Transportation needs - non-medical: Not on file  Occupational History  . Occupation: Disabled   Tobacco Use  . Smoking status: Current Every Day Smoker  Packs/day: 1.00    Years: 48.00    Pack years: 48.00    Types: Cigars  . Smokeless tobacco: Never Used  . Tobacco comment: used to amoke 2PPD- lately cut down to 1/2 PPD  Substance and Sexual Activity  . 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 Drug use: No  . Sexual activity: Not on file  Other Topics Concern  . Not  on file  Social History Narrative   Used to be in Architect, very independent but lately over the past few months- due to weakness, limited activity only.    Family History  Problem Relation Age of Onset  . Cancer Mother   . Hypertension Mother   . Diabetes Father   . Heart disease Father   . Cancer Paternal Grandfather   . Prostate cancer Neg Hx   . Bladder Cancer Neg Hx   . Kidney cancer Neg Hx       Review of systems complete and found to be negative unless listed above      PHYSICAL EXAM  General: Well developed, well nourished, in no acute distress HEENT:  Normocephalic and atramatic Neck:  No JVD.  Lungs: Clear bilaterally to auscultation and percussion. Heart: HRRR . Normal S1 and S2 without gallops or murmurs.  Abdomen: Bowel sounds are positive, abdomen soft and non-tender  Msk:  Back normal, normal gait. Normal strength and tone for age. Extremities: No clubbing, cyanosis or edema.   Neuro: Alert and oriented X 3. Psych:  Good affect, responds appropriately  Labs:   Lab Results  Component Value Date   WBC 7.5 07/29/2017   HGB 7.7 (L) 07/29/2017   HCT 23.2 (L) 07/29/2017   MCV 90.0 07/29/2017   PLT 229 07/29/2017    Recent Labs  Lab 07/28/17 1046 07/29/17 0347  NA 135 138  K 4.5 4.7  CL 102 111  CO2 20* 18*  BUN 72* 58*  CREATININE 2.52* 2.27*  CALCIUM 8.5* 8.0*  PROT 7.2  --   BILITOT 0.5  --   ALKPHOS 332*  --   ALT 38  --   AST 24  --   GLUCOSE 112* 105*   Lab Results  Component Value Date   CKTOTAL 70 01/20/2012   CKMB 1.7 01/20/2012    Lab Results  Component Value Date   CHOL 201 (H) 11/15/2016   CHOL 236 (H) 08/13/2015   Lab Results  Component Value Date   HDL 46 11/15/2016   HDL 38 (L) 08/13/2015   Lab Results  Component Value Date   LDLCALC 133 (H) 11/15/2016   LDLCALC 159 (H) 08/13/2015   Lab Results  Component Value Date   TRIG 109 11/15/2016   TRIG 196 (H) 08/13/2015   Lab Results  Component Value Date    CHOLHDL 4.4 11/15/2016   CHOLHDL 6.2 (H) 08/13/2015   No results found for: LDLDIRECT    Radiology: Ct Abdomen Pelvis Wo Contrast  Result Date: 07/28/2017 CLINICAL DATA:  Fever. Redness and drainage at nephrostomy tube sites. History of prostate cancer. EXAM: CT ABDOMEN AND PELVIS WITHOUT CONTRAST TECHNIQUE: Multidetector CT imaging of the abdomen and pelvis was performed following the standard protocol without IV contrast. COMPARISON:  CT abdomen pelvis dated April 27, 2017. FINDINGS: Lower chest: No acute abnormality. Hepatobiliary: No focal liver abnormality is seen. No gallstones, gallbladder wall thickening, or biliary dilatation. Pancreas: Unremarkable.  No pancreatic ductal dilatation or surrounding inflammatory changes. Spleen: Normal in size without focal abnormality. Adrenals/Urinary Tract: Unchanged left adrenal adenoma. Bilateral nephrostomy tubes in appropriate position. Bilateral double-J ureteral stents in appropriate position. No hydronephrosis. The bladder is decompressed. Stomach/Bowel: Small hiatal hernia. The stomach is otherwise within normal limits. Appendix is normal. Mild colonic stool burden. Unchanged wall thickening of the rectum. No bowel obstruction. Vascular/Lymphatic: Aortic atherosclerosis. Retroperitoneal, common iliac, and external iliac lymphadenopathy appears stable to slightly decreased in size. For example, the right external iliac lymph node now measures 2.0 cm in short axis, previously 3.2 cm. Reproductive: Prostatomegaly, unchanged. Other: Abnormal soft tissue stranding in the retroperitoneum, pelvic sidewalls, and perirectal and presacral spaces is similar to prior study. No pneumoperitoneum or free fluid. Musculoskeletal: New diffuse bony heterogeneity of the visualized thoracolumbar spine and pelvis, concerning for osseous metastatic disease. New Schmorl's node versus small superior endplate compression deformity along the right aspect of the L4 vertebral body.  IMPRESSION: 1. Appropriately positioned bilateral nephrostomy tubes and double-J ureteral stents. No hydronephrosis. No soft tissue inflammation or fluid collection surrounding the nephrostomy tubes at the skin surface. 2. New diffuse bony heterogeneity of the visualized thoracolumbar spine and pelvis, concerning for osseous metastatic disease. 3. New Schmorl's node versus small superior endplate compression deformity along the right aspect of the L4 vertebral body. 4. Stable to interval decrease in size of retroperitoneal, common iliac, and external iliac lymphadenopathy. 5. Relatively unchanged abnormal soft tissue stranding in the retroperitoneum, pelvic sidewalls, and perirectal and presacral spaces. 6.  Aortic atherosclerosis (ICD10-I70.0). Electronically Signed   By: Titus Dubin M.D.   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 Normal heart size and vascularity. Chronic interstitial prominence diffusely. No focal collapse, consolidation, pneumonia, edema, effusion, or pneumothorax. Stable right hilar fullness suspicious for right hilar adenopathy. 1.9 cm right upper lobe irregular nodule by CT is grossly stable. IMPRESSION: Stable COPD/emphysema pattern and other chronic findings as above. No superimposed acute process or significant interval change by plain radiography. Electronically Signed   By: Jerilynn Mages.  Shick M.D.   On: 07/28/2017 11:34   US Venous Img Lower Bilateral  Result Date: 07/29/2017 CLINICAL DATA:  History of metastatic prostate cancer, now with bilateral lower extremity swelling. EXAM: BILATERAL LOWER EXTREMITY VENOUS DOPPLER ULTRASOUND TECHNIQUE: Gray-scale sonography with graded compression, as well as color Doppler and duplex ultrasound were  performed to evaluate the lower extremity deep venous systems from the level of the common femoral vein and including the common femoral, femoral, profunda femoral, popliteal and calf veins including the posterior tibial, peroneal and gastrocnemius veins when visible. The superficial great saphenous vein was also interrogated. Spectral Doppler was utilized to evaluate flow at rest and with distal augmentation maneuvers in the common femoral, femoral and popliteal veins. COMPARISON:  Bilateral lower extremity venous Doppler ultrasound - 06/12/2017 FINDINGS: RIGHT LOWER EXTREMITY Common Femoral Vein: No evidence of thrombus. Normal compressibility, respiratory phasicity and response to augmentation. Saphenofemoral Junction: No evidence of thrombus. Normal compressibility and flow on color Doppler imaging. Profunda Femoral Vein: No evidence of thrombus. Normal compressibility and flow on color Doppler imaging. Femoral Vein: There is echogenic nonocclusive wall thickening/mural calcifications within mid aspect of the right femoral vein (image 16, 18 and 19), likely the sequela prior DVT. No evidence of acute  DVT within the right femoral vein. Popliteal Vein: No evidence of thrombus. Normal compressibility, respiratory phasicity and response to augmentation. Calf Veins: No evidence of thrombus. Normal compressibility and flow on color Doppler imaging. Superficial Great Saphenous Vein: No evidence of thrombus. Normal compressibility. Venous Reflux:  None. Other Findings:  None. LEFT LOWER EXTREMITY Common Femoral Vein: No evidence of thrombus. Normal compressibility, respiratory phasicity and response to augmentation. Saphenofemoral Junction: No evidence of thrombus. Normal compressibility and flow on color Doppler imaging. Profunda Femoral Vein: No evidence of thrombus. Normal compressibility and flow on color Doppler imaging. Femoral Vein: No evidence of thrombus. Normal compressibility, respiratory phasicity and  response to augmentation. Popliteal Vein: No evidence of thrombus. Normal compressibility, respiratory phasicity and response to augmentation. Calf Veins: No evidence of thrombus. Normal compressibility and flow on color Doppler imaging. Superficial Great Saphenous Vein: No evidence of thrombus. Normal compressibility. Venous Reflux:  None. Other Findings:  None. IMPRESSION: 1. No evidence of acute DVT within either lower extremity. 2. Nonocclusive mural thickening/calcifications within the mid aspect of the right femoral vein, likely the sequela of prior DVT. Electronically Signed   By: Sandi Mariscal M.D.   On: 07/29/2017 08:34   Ir Nephrostogram Left Thru Existing Access  Result Date: 07/21/2017 CLINICAL DATA:  History metastatic prostate cancer with obstructive bilateral uropathy post failed right-sided double-J ureteral stent placement. Post successful ultrasound and fluoroscopic guided placement of bilateral percutaneous nephrostomy catheters on 06/14/2017. Patient presents today for bilateral antegrade nephrostograms with potential placement of left-sided ureteral stent. EXAM: 1. BILATERAL NEPHROSTOGRAM. 2. LEFT-SIDED URETERAL STENT PLACEMENT 3. BILATERAL PERCUTANEOUS NEPHROSTOMY CATHETER EXCHANGE COMPARISON:  Bilateral ultrasound fluoroscopic guided nephrostomy catheter placement - 06/14/2017 MEDICATIONS: Ciprofloxacin 400 mg IV; The antibiotics were administered within 1 hour of the procedure. ANESTHESIA/SEDATION: Moderate (conscious) sedation was employed during this procedure. A total of Versed 1 mg and Fentanyl 50 mcg was administered intravenously. Moderate Sedation Time: 22 minutes. The patient's level of consciousness and vital signs were monitored continuously by radiology nursing throughout the procedure under my direct supervision. CONTRAST:  A total of 50 cc Isovue-300 was administered into the bilateral collecting systems. FLUOROSCOPY TIME:  4 minutes (40 mGy) COMPLICATIONS: None immediate  PROCEDURE: The procedure, risks, benefits, and alternatives were explained to the patient. Questions regarding the procedure were encouraged and answered. The patient understands and consents to the procedure. The nephrostomy tubes and surrounding skin were prepped with Betadine in a sterile fashion, and a sterile drape was applied covering the operative field. A sterile gown and sterile gloves were used for the procedure. Local anesthesia was provided with 1% Lidocaine. Antegrade nephrostogram was performed via the existing right-sided percutaneous nephrostomy catheter demonstrating patency of the right-sided ureteral stent with passage of contrast through and around the stent to the level of the urinary bladder. The right-sided nephrostomy catheter was capped Tension was now paid towards the left-sided nephrostomy Initially, a left-sided antegrade nephrostogram was performed via the existing left-sided nephrostomy demonstrated patency of the left ureter to the level of the urinary bladder however no was made of a long segment narrowing/stricture of the distal aspect the left ureter. External portion of the left-sided nephrostomy catheter was cut and the catheter was cannulated with a short Amplatz wire which was coiled within the left renal pelvis. Under intermittent fluoroscopic guidance, the existing nephrostomy catheter was exchanged for a 9 French radiopaque tip vascular sheath was advanced to the level of the left renal pelvis. With the use of a regular glidewire, a Kumpe  the catheter was advanced through the left ureter to the level of the urinary bladder. Contrast injection confirmed appropriate positioning. Next, a Amplatz wire was coiled within the urinary bladder and the Kumpe the catheter was utilized for measurement purposes. Under intermittent fluoroscopic guided exchange, the vascular sheath was exchanged for an 8-French, 22 cm ureteral stent was then advanced over an Amplatz superstiff guidewire.  The distal portion was formed at the level of the bladder. Proximal portion was then formed at the level of the renal collecting system. At this point, the safety wire was utilized to replace an existing 10.2 [place] percutaneous nephrostomy catheter within the renal collecting system. Postprocedural images were obtained in various obliquities. Left-sided nephrostomy catheter was capped and dressings were applied. The patient tolerated the above procedure well without immediate postprocedural complication. FINDINGS: Nephrostogram demonstrates appropriate position existing bilateral percutaneous nephrostomy catheters. Bilateral nephrostograms demonstrate mild residual hydronephrosis and ureterectasis. There is marked tortuosity of the mid and distal aspect of the bilateral ureters. No discrete intraluminal filling defects are seen within either collecting system or ureter. A Foley catheter is within the urinary bladder. Bilateral ureteral stent was able to be placed spanning from the collecting system to the bladder. The left ureteral stent is 8 Fr, 28 cm, and the right is 8 Fr, 24 cm. The bilateral 10.2 French nephrostomy catheter were replaced with coils formed and locked within the renal pelvis. IMPRESSION: 1. Successful placement of left-sided 22 cm ureteral stent. 2. Successful fluoroscopic guided exchange of left-sided percutaneous nephrostomy catheter. The left-sided nephrostomy catheter was capped for a trial of internalization. 3. Wide patency of the existing right-sided ureteral stent. The right-sided nephrostomy catheter was capped for a trial of internalization. PLAN: - The bilateral nephrostomy catheters were capped for a trial of internalization. The patient was given 2 gravity bags and instructed to reconnect either one or both of the nephrostomy catheters if he were to develop recurrent flank pain and/or pericatheter leaking as this could herald a sign of stent failure. The patient and the patient's  wife demonstrated excellent understanding of this conversation. - The patient will return to the interventional radiology department next week. Assuming he passes this trial of internalization, repeat bilateral antegrade nephrostogram will be performed followed by fluoroscopic guided bilateral nephrostomy catheter removals. Electronically Signed   By: Sandi Mariscal M.D.   On: 07/21/2017 16:22   Ir Ureteral Stent Placement Existing Access Left  Result Date: 07/21/2017 CLINICAL DATA:  History metastatic prostate cancer with obstructive bilateral uropathy post failed right-sided double-J ureteral stent placement. Post successful ultrasound and fluoroscopic guided placement of bilateral percutaneous nephrostomy catheters on 06/14/2017. Patient presents today for bilateral antegrade nephrostograms with potential placement of left-sided ureteral stent. EXAM: 1. BILATERAL NEPHROSTOGRAM. 2. LEFT-SIDED URETERAL STENT PLACEMENT 3. BILATERAL PERCUTANEOUS NEPHROSTOMY CATHETER EXCHANGE COMPARISON:  Bilateral ultrasound fluoroscopic guided nephrostomy catheter placement - 06/14/2017 MEDICATIONS: Ciprofloxacin 400 mg IV; The antibiotics were administered within 1 hour of the procedure. ANESTHESIA/SEDATION: Moderate (conscious) sedation was employed during this procedure. A total of Versed 1 mg and Fentanyl 50 mcg was administered intravenously. Moderate Sedation Time: 22 minutes. The patient's level of consciousness and vital signs were monitored continuously by radiology nursing throughout the procedure under my direct supervision. CONTRAST:  A total of 50 cc Isovue-300 was administered into the bilateral collecting systems. FLUOROSCOPY TIME:  4 minutes (40 mGy) COMPLICATIONS: None immediate PROCEDURE: The procedure, risks, benefits, and alternatives were explained to the patient. Questions regarding the procedure were encouraged and answered. The  patient understands and consents to the procedure. The nephrostomy tubes and  surrounding skin were prepped with Betadine in a sterile fashion, and a sterile drape was applied covering the operative field. A sterile gown and sterile gloves were used for the procedure. Local anesthesia was provided with 1% Lidocaine. Antegrade nephrostogram was performed via the existing right-sided percutaneous nephrostomy catheter demonstrating patency of the right-sided ureteral stent with passage of contrast through and around the stent to the level of the urinary bladder. The right-sided nephrostomy catheter was capped Tension was now paid towards the left-sided nephrostomy Initially, a left-sided antegrade nephrostogram was performed via the existing left-sided nephrostomy demonstrated patency of the left ureter to the level of the urinary bladder however no was made of a long segment narrowing/stricture of the distal aspect the left ureter. External portion of the left-sided nephrostomy catheter was cut and the catheter was cannulated with a short Amplatz wire which was coiled within the left renal pelvis. Under intermittent fluoroscopic guidance, the existing nephrostomy catheter was exchanged for a 9 French radiopaque tip vascular sheath was advanced to the level of the left renal pelvis. With the use of a regular glidewire, a Kumpe the catheter was advanced through the left ureter to the level of the urinary bladder. Contrast injection confirmed appropriate positioning. Next, a Amplatz wire was coiled within the urinary bladder and the Kumpe the catheter was utilized for measurement purposes. Under intermittent fluoroscopic guided exchange, the vascular sheath was exchanged for an 8-French, 22 cm ureteral stent was then advanced over an Amplatz superstiff guidewire. The distal portion was formed at the level of the bladder. Proximal portion was then formed at the level of the renal collecting system. At this point, the safety wire was utilized to replace an existing 10.2 [place] percutaneous  nephrostomy catheter within the renal collecting system. Postprocedural images were obtained in various obliquities. Left-sided nephrostomy catheter was capped and dressings were applied. The patient tolerated the above procedure well without immediate postprocedural complication. FINDINGS: Nephrostogram demonstrates appropriate position existing bilateral percutaneous nephrostomy catheters. Bilateral nephrostograms demonstrate mild residual hydronephrosis and ureterectasis. There is marked tortuosity of the mid and distal aspect of the bilateral ureters. No discrete intraluminal filling defects are seen within either collecting system or ureter. A Foley catheter is within the urinary bladder. Bilateral ureteral stent was able to be placed spanning from the collecting system to the bladder. The left ureteral stent is 8 Fr, 28 cm, and the right is 8 Fr, 24 cm. The bilateral 10.2 French nephrostomy catheter were replaced with coils formed and locked within the renal pelvis. IMPRESSION: 1. Successful placement of left-sided 22 cm ureteral stent. 2. Successful fluoroscopic guided exchange of left-sided percutaneous nephrostomy catheter. The left-sided nephrostomy catheter was capped for a trial of internalization. 3. Wide patency of the existing right-sided ureteral stent. The right-sided nephrostomy catheter was capped for a trial of internalization. PLAN: - The bilateral nephrostomy catheters were capped for a trial of internalization. The patient was given 2 gravity bags and instructed to reconnect either one or both of the nephrostomy catheters if he were to develop recurrent flank pain and/or pericatheter leaking as this could herald a sign of stent failure. The patient and the patient's wife demonstrated excellent understanding of this conversation. - The patient will return to the interventional radiology department next week. Assuming he passes this trial of internalization, repeat bilateral antegrade  nephrostogram will be performed followed by fluoroscopic guided bilateral nephrostomy catheter removals. Electronically Signed   By:  Sandi Mariscal M.D.   On: 07/21/2017 16:22    EKG: Sinus tachycardia without acute ischemic ST-T wave changes  ASSESSMENT AND PLAN:   1.  Urosepsis with bacteremia, unremarkable surface 2D echocardiogram 2.  History of myocardial infarction, status post coronary stent, currently without chest pain or ischemic ECG changes  Recommendations  1.  Agree with current therapy 2.  Proceed with transesophageal echocardiogram, to be scheduled for 07/31/2017.  The risks, benefits alternatives were explained to the patient and informed consent was obtained.  Signed: Isaias Cowman MD,PhD, [facility] 07/29/2017, 2:32 PM

## 2017-07-29 NOTE — Progress Notes (Signed)
Family Meeting Note  Advance Directive:yes  Today a meeting took place with the Patient and spouse.  The following clinical team members were present during this meeting:MD  The following were discussed:Patient's diagnosis: Prostate cancer, mets, does not want chemotherapy. , Patient's progosis: Unable to determine and Goals for treatment: DNR  As pt have chosen no chemo and to treat his symptoms from his cancer, I have discussed options of hospice care at home. He currently wants to have Abx for his infection, so we may arrange for palliative care to follow on d./c and convert to hospice care in future when appropriate. He had also verbelized his wishes to stay home in his last days and not to come hospital, his wife is aware.  Additional follow-up to be provided: Palliative care.  Time spent during discussion:20 minutes  Vaughan Basta, MD

## 2017-07-29 NOTE — Consult Note (Signed)
Mr. Hoeg is well-known to urology.  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  He underwent bilateral nephrostomy tube placement several weeks ago.  He had successful antegrade stent placement last week and his nephrostomy tubes were capped.  He saw Dr. Erlene Quan earlier this week with fever of 102 degrees and refused inpatient management.  Blood cultures were positive for staph aureus and he agreed to hospital admission. He was scheduled for repeat nephrostograms yesterday and possible nephrostomy tube removal however this was postponed due to his current infection.  Recommendation: He has bilateral ureteral stents.  Once his acute infection is improving would recommend bilateral nephrostograms and hopefully nephrostomy tube removal.

## 2017-07-30 DIAGNOSIS — R109 Unspecified abdominal pain: Secondary | ICD-10-CM

## 2017-07-30 DIAGNOSIS — R7989 Other specified abnormal findings of blood chemistry: Secondary | ICD-10-CM

## 2017-07-30 DIAGNOSIS — R0602 Shortness of breath: Secondary | ICD-10-CM

## 2017-07-30 DIAGNOSIS — N390 Urinary tract infection, site not specified: Secondary | ICD-10-CM

## 2017-07-30 DIAGNOSIS — R531 Weakness: Secondary | ICD-10-CM

## 2017-07-30 DIAGNOSIS — R609 Edema, unspecified: Secondary | ICD-10-CM

## 2017-07-30 MED ORDER — ENSURE ENLIVE PO LIQD
237.0000 mL | Freq: Three times a day (TID) | ORAL | Status: DC
  Administered 2017-07-30: 237 mL via ORAL

## 2017-07-30 MED ORDER — FENTANYL 50 MCG/HR TD PT72: 50.0000 ug | MEDICATED_PATCH | TRANSDERMAL | Status: DC

## 2017-07-30 NOTE — Progress Notes (Signed)
Initial Nutrition Assessment  DOCUMENTATION CODES:   Severe malnutrition in context of chronic illness  INTERVENTION:  Recommend liberalizing diet to regular.  Provide Ensure Enlive po TID, each supplement provides 350 kcal and 20 grams of protein. Patient prefers chocolate.  NUTRITION DIAGNOSIS:   Severe Malnutrition related to chronic illness(metastatic prostate cancer, CKD) as evidenced by severe fat depletion, moderate muscle depletion, severe muscle depletion, 20.5 percent weight loss over 9 months.  GOAL:   Patient will meet greater than or equal to 90% of their needs  MONITOR:   PO intake, Supplement acceptance, Labs, Weight trends, I & O's  REASON FOR ASSESSMENT:   Malnutrition Screening Tool    ASSESSMENT:   62 year old male with PMHx of depression, HTN, HLD, hx MI s/p stent placement 12/2011, GERD, neuromuscular disorder (numbnes in arms and legs), metastatic prostate cancer (pt does not want chemotherapy), CKD, bilateral ureteral obstruction with bilateral nephrostomy tubes in place who is now admitted with sepsis due to UTI.   -Per chart plan is for patient to discharge home with palliative services and then transition to hospice when appropriate.  Met with patient, his wife, and other family members at bedside. Patient reports he has had a poor appetite for a while now (unsure of exact timeframe). He reports he has metastatic prostate cancer for which he does not want treatment. He has been getting full very quickly. At home he only eats 1 small meal per day prepared by wife. She reports preparing him a variety of different foods and occasionally he will eat and occasionally he will now. He drinks chocolate Ensure at home and also drinks a protein shake made by wife each day. He is not able to eat here because he does not like the taste of food on the heart healthy diet.  UBW was 200 lbs. He reports he has lost down to about 144 lbs now. Unable to verify weight as  patient was sitting on chair instead of bed. Per chart he was last 202 lbs in 2017. He was 181.2 lbs on 11/24/2016 and has lost about 37 lbs (20.5% body weight) over the past 9 months, which is significant for time frame.  Medications reviewed and include: Flomax, NS @ 75 mL/hr, cefazolin.  Labs reviewed: CO2 18, BUN 58, Creatinine 2.27, Glucose 105.  NUTRITION - FOCUSED PHYSICAL EXAM:    Most Recent Value  Orbital Region  Severe depletion  Upper Arm Region  Severe depletion  Thoracic and Lumbar Region  Moderate depletion  Buccal Region  Severe depletion  Temple Region  Severe depletion  Clavicle Bone Region  Severe depletion  Clavicle and Acromion Bone Region  Severe depletion  Scapular Bone Region  Moderate depletion  Dorsal Hand  Severe depletion  Patellar Region  Moderate depletion  Anterior Thigh Region  Moderate depletion  Posterior Calf Region  Moderate depletion  Edema (RD Assessment)  Mild [bilateral lower extremities]  Hair  Reviewed  Eyes  Reviewed  Mouth  Reviewed  Skin  Reviewed  Nails  Reviewed     Diet Order:  Diet Heart Room service appropriate? Yes; Fluid consistency: Thin  EDUCATION NEEDS:   Not appropriate for education at this time  Skin:  Skin Assessment: Reviewed RN Assessment  Last BM:  07/29/2017  Height:   Ht Readings from Last 1 Encounters:  07/28/17 5' 10"  (1.778 m)    Weight:   Wt Readings from Last 1 Encounters:  07/28/17 144 lb (65.3 kg)    Ideal  Body Weight:  75.5 kg  BMI:  Body mass index is 20.66 kg/m.  Estimated Nutritional Needs:   Kcal:  1900-2200 (MSJ x 1.3-1.5)  Protein:  85-100 grams (1.3-1.5 grams/kg)  Fluid:  2-2.3 L/day (30-35 mL/kg)  Willey Blade, MS, RD, LDN Office: [phone] Pager: [phone] After Hours/Weekend Pager: [phone]

## 2017-07-30 NOTE — Progress Notes (Addendum)
[place] at [place] NAME: Michael Small    MR#:  [id number]  DATE OF BIRTH:  08/24/1955  SUBJECTIVE:  CHIEF COMPLAINT:   Chief Complaint  Patient presents with  . Fever    Prostatic cancer with bilateral obstruction, and nephrostomy tube placements, had fever and bacteremia noted and came with hypotension and symptoms of infection. Seen by infectious disease, suggested to do antibiotics and repeat blood culture and TEE to rule out endocarditis. Patient said he feels much better today.  Hemoglobin dropped, he had 3 loose and dark colored bowel movement last night.  REVIEW OF SYSTEMS:  CONSTITUTIONAL: No fever,positive for fatigue or weakness.  EYES: No blurred or double vision.  EARS, NOSE, AND THROAT: No tinnitus or ear pain.  RESPIRATORY: No cough, shortness of breath, wheezing or hemoptysis.  CARDIOVASCULAR: No chest pain, orthopnea, edema.  GASTROINTESTINAL: No nausea, vomiting, diarrhea or abdominal pain.  GENITOURINARY: No dysuria, hematuria.  ENDOCRINE: No polyuria, nocturia,  HEMATOLOGY: No anemia, easy bruising or bleeding SKIN: No rash or lesion. MUSCULOSKELETAL: No joint pain or arthritis.   NEUROLOGIC: No tingling, numbness, weakness.  PSYCHIATRY: No anxiety or depression.   ROS  DRUG ALLERGIES:  No Known Allergies  VITALS:  Blood pressure 115/60, pulse 85, temperature 98 F (36.7 C), temperature source Oral, resp. rate (!) 22, height 5\' 10"  (1.778 m), weight 65.3 kg (144 lb), SpO2 100 %.  PHYSICAL EXAMINATION:  GENERAL:  62 y.o.-year-old patient lying in the bed with no acute distress.  EYES: Pupils equal, round, reactive to light and accommodation. No scleral icterus. Extraocular muscles intact.  HEENT: Head atraumatic, normocephalic. Oropharynx and nasopharynx clear.  NECK:  Supple, no jugular venous distention. No thyroid enlargement, no tenderness.  LUNGS: Normal breath sounds bilaterally, no wheezing,  rales,rhonchi or crepitation. No use of accessory muscles of respiration.  CARDIOVASCULAR: S1, S2 normal. No murmurs, rubs, or gallops.  ABDOMEN: Soft, nontender, nondistended. Bowel sounds present. No organomegaly or mass. B/l nephrostomy tubes. EXTREMITIES: No pedal edema, cyanosis, or clubbing.  NEUROLOGIC: Cranial nerves II through XII are intact. Muscle strength 4/5 in all extremities. Sensation intact. Gait not checked.  PSYCHIATRIC: The patient is alert and oriented x 3.  SKIN: No obvious rash, lesion, or ulcer.   Physical Exam LABORATORY PANEL:   CBC Recent Labs  Lab 07/29/17 0347  WBC 7.5  HGB 7.7*  HCT 23.2*  PLT 229   ------------------------------------------------------------------------------------------------------------------  Chemistries  Recent Labs  Lab 07/28/17 1046 07/29/17 0347  NA 135 138  K 4.5 4.7  CL 102 111  CO2 20* 18*  GLUCOSE 112* 105*  BUN 72* 58*  CREATININE 2.52* 2.27*  CALCIUM 8.5* 8.0*  AST 24  --   ALT 38  --   ALKPHOS 332*  --   BILITOT 0.5  --    ------------------------------------------------------------------------------------------------------------------  Cardiac Enzymes No results for input(s): TROPONINI in the last 168 hours. ------------------------------------------------------------------------------------------------------------------  RADIOLOGY:  US Venous Img Lower Bilateral  Result Date: 07/29/2017 CLINICAL DATA:  History of metastatic prostate cancer, now with bilateral lower extremity swelling. EXAM: BILATERAL LOWER EXTREMITY VENOUS DOPPLER ULTRASOUND TECHNIQUE: Gray-scale sonography with graded compression, as well as color Doppler and duplex ultrasound were performed to evaluate the lower extremity deep venous systems from the level of the common femoral vein and including the common femoral, femoral, profunda femoral, popliteal and calf veins including the posterior tibial, peroneal and gastrocnemius veins when  visible. The superficial great saphenous vein was also interrogated.  Spectral Doppler was utilized to evaluate flow at rest and with distal augmentation maneuvers in the common femoral, femoral and popliteal veins. COMPARISON:  Bilateral lower extremity venous Doppler ultrasound - 06/12/2017 FINDINGS: RIGHT LOWER EXTREMITY Common Femoral Vein: No evidence of thrombus. Normal compressibility, respiratory phasicity and response to augmentation. Saphenofemoral Junction: No evidence of thrombus. Normal compressibility and flow on color Doppler imaging. Profunda Femoral Vein: No evidence of thrombus. Normal compressibility and flow on color Doppler imaging. Femoral Vein: There is echogenic nonocclusive wall thickening/mural calcifications within mid aspect of the right femoral vein (image 16, 18 and 19), likely the sequela prior DVT. No evidence of acute DVT within the right femoral vein. Popliteal Vein: No evidence of thrombus. Normal compressibility, respiratory phasicity and response to augmentation. Calf Veins: No evidence of thrombus. Normal compressibility and flow on color Doppler imaging. Superficial Great Saphenous Vein: No evidence of thrombus. Normal compressibility. Venous Reflux:  None. Other Findings:  None. LEFT LOWER EXTREMITY Common Femoral Vein: No evidence of thrombus. Normal compressibility, respiratory phasicity and response to augmentation. Saphenofemoral Junction: No evidence of thrombus. Normal compressibility and flow on color Doppler imaging. Profunda Femoral Vein: No evidence of thrombus. Normal compressibility and flow on color Doppler imaging. Femoral Vein: No evidence of thrombus. Normal compressibility, respiratory phasicity and response to augmentation. Popliteal Vein: No evidence of thrombus. Normal compressibility, respiratory phasicity and response to augmentation. Calf Veins: No evidence of thrombus. Normal compressibility and flow on color Doppler imaging. Superficial Great Saphenous  Vein: No evidence of thrombus. Normal compressibility. Venous Reflux:  None. Other Findings:  None. IMPRESSION: 1. No evidence of acute DVT within either lower extremity. 2. Nonocclusive mural thickening/calcifications within the mid aspect of the right femoral vein, likely the sequela of prior DVT. Electronically Signed   By: Sandi Mariscal M.D.   On: 07/29/2017 08:34    ASSESSMENT AND PLAN:   Active Problems:   UTI (urinary tract infection)   1.  Sepsis related to UTI- MSSA bacteremia Repeat bl cx sent and on cefazoline per ID.   Need TEE to r/o endocarditis. Called cardio consult.  2.  Prostate cancer with metastases with bilateral nephrostomy tubes Urology consult to advise regarding further regarding nephrostomy tubes especially with drainage  fentanyl to help with pain control.  Urology suggested to follow up after infection is controlled for possible removal of bilateral nephrostomy tube.  3.  Essential hypertension continue Norvasc  4.  Nicotine addiction smoking cessation provided 4 minutes spent strongly recommend he stop smoking nicotine patch will be started  5. Anemia    Likely due to chronic disease, cancer, CKD stage 63monitor.   He has dark colored bowel movement, we will check for stool guaiac and hold heparin subcutaneous, GI consult.   Patient denies use of over-the-counter pain medication or history of peptic ulcer disease. He did not had a colonoscopy done in the recent past.  6. CKD stage 4- monitor.  Patient want to have hospice arrangement at home at the time of discharge, as he does not want to have aggressive management for his cancer and will not like to return to hospital for any future complications. I will wait for that arrangement on his discharge.  All the records are reviewed and case discussed with Care Management/Social Workerr. Management plans discussed with the patient, family and they are in agreement.  CODE STATUS: DNR  TOTAL TIME TAKING  CARE OF THIS PATIENT: 35 minutes.    POSSIBLE D/C IN 2-3 DAYS, DEPENDING ON CLINICAL  CONDITION. Discussed with his wife in room.  Vaughan Basta M.D on 07/30/2017   Between 7am to 6pm - Pager - [phone]  After 6pm go to [email or web address] - password [email or web address] [facility]  Office  [phone]  CC: Primary care physician; Birdie Sons, MD  Note: This dictation was prepared with Dragon dictation along with smaller phrase technology. Any transcriptional errors that result from this process are unintentional.

## 2017-07-30 NOTE — Progress Notes (Signed)
Infectious Disease Long Term IV Antibiotic Orders AYHAM WORD 09-Feb-1956  Diagnosis: MSSA bacteremia Infected Nephrostomy tube  Culture results Susceptibility    Staphylococcus aureus    MIC    CIPROFLOXACIN <=0.5 SENSI... Sensitive    CLINDAMYCIN <=0.25 SENS... Sensitive    ERYTHROMYCIN <=0.25 SENS... Sensitive    GENTAMICIN <=0.5 SENSI... Sensitive    Inducible Clindamycin NEGATIVE  Sensitive    OXACILLIN 0.5 SENSITIVE  Sensitive    RIFAMPIN <=0.5 SENSI... Sensitive    TETRACYCLINE <=1 SENSITIVE  Sensitive    TRIMETH/SULFA <=10 SENSIT... Sensitive    VANCOMYCIN 1 SENSITIVE  Sensitive         Susceptibility Comments   Staphylococcus aureus  STAPHYLOCOCCUS AUREUS    LABS Lab Results  Component Value Date   CREATININE 2.27 (H) 07/29/2017   Lab Results  Component Value Date   WBC 7.5 07/29/2017   HGB 7.7 (L) 07/29/2017   HCT 23.2 (L) 07/29/2017   MCV 90.0 07/29/2017   PLT 229 07/29/2017   Lab Results  Component Value Date   ESRSEDRATE 38 (H) 04/24/2017   No results found for: CRP  Allergies: No Known Allergies  Discharge antibiotics Cefazolin 1 gm q 12   PICC Care per protocol Labs weekly while on IV antibiotics -FAX weekly labs to [phone] CBC w diff   Cr  Planned duration of antibiotics- 2 weeks   Stop date 3/15   Follow up clinic date 10 days    Leonel Ramsay, MD

## 2017-07-30 NOTE — Progress Notes (Signed)
I had a detailed discussion with Michael Small and his wife today regarding his prostate cancer diagnosis.  Although he does have widely metastatic prostate cancer he had a significant PSA bump secondary to tumor flare from Lupron.  His PSA has decreased from 3000 to 1000 in a brief period of time.  His testosterone level is now castrate.  It was stressed that the Lupron injection he received will continue most likely continue to be effective over the next several months and a Lupron injection every 6 months will be recommended.  I feel it is too early to give a prognosis until his PSA nadir is achieved.  He was informed that 97% of patients with prostate cancer respond to hormonal therapy however at some point some of the prostate cancer cells will escape hormonal control.  All of their questions were answered.

## 2017-07-30 NOTE — Progress Notes (Signed)
[place] at [place] NAME: Michael Small    MR#:  [id number]  DATE OF BIRTH:  Sep 21, 1955  SUBJECTIVE:  CHIEF COMPLAINT:   Chief Complaint  Patient presents with  . Fever    Prostatic cancer with bilateral obstruction, and nephrostomy tube placements, had fever and bacteremia noted and came with hypotension and symptoms of infection. Seen by infectious disease, suggested to do antibiotics and repeat blood culture and TEE to rule out endocarditis. Patient said he feels much better today.  REVIEW OF SYSTEMS:  CONSTITUTIONAL: No fever,positive for fatigue or weakness.  EYES: No blurred or double vision.  EARS, NOSE, AND THROAT: No tinnitus or ear pain.  RESPIRATORY: No cough, shortness of breath, wheezing or hemoptysis.  CARDIOVASCULAR: No chest pain, orthopnea, edema.  GASTROINTESTINAL: No nausea, vomiting, diarrhea or abdominal pain.  GENITOURINARY: No dysuria, hematuria.  ENDOCRINE: No polyuria, nocturia,  HEMATOLOGY: No anemia, easy bruising or bleeding SKIN: No rash or lesion. MUSCULOSKELETAL: No joint pain or arthritis.   NEUROLOGIC: No tingling, numbness, weakness.  PSYCHIATRY: No anxiety or depression.   ROS  DRUG ALLERGIES:  No Known Allergies  VITALS:  Blood pressure 115/60, pulse 85, temperature 98 F (36.7 C), temperature source Oral, resp. rate (!) 22, height 5\' 10"  (1.778 m), weight 65.3 kg (144 lb), SpO2 100 %.  PHYSICAL EXAMINATION:  GENERAL:  62 y.o.-year-old patient lying in the bed with no acute distress.  EYES: Pupils equal, round, reactive to light and accommodation. No scleral icterus. Extraocular muscles intact.  HEENT: Head atraumatic, normocephalic. Oropharynx and nasopharynx clear.  NECK:  Supple, no jugular venous distention. No thyroid enlargement, no tenderness.  LUNGS: Normal breath sounds bilaterally, no wheezing, rales,rhonchi or crepitation. No use of accessory muscles of respiration.  CARDIOVASCULAR:  S1, S2 normal. No murmurs, rubs, or gallops.  ABDOMEN: Soft, nontender, nondistended. Bowel sounds present. No organomegaly or mass. B/l nephrostomy tubes. EXTREMITIES: No pedal edema, cyanosis, or clubbing.  NEUROLOGIC: Cranial nerves II through XII are intact. Muscle strength 4/5 in all extremities. Sensation intact. Gait not checked.  PSYCHIATRIC: The patient is alert and oriented x 3.  SKIN: No obvious rash, lesion, or ulcer.   Physical Exam LABORATORY PANEL:   CBC Recent Labs  Lab 07/29/17 0347  WBC 7.5  HGB 7.7*  HCT 23.2*  PLT 229   ------------------------------------------------------------------------------------------------------------------  Chemistries  Recent Labs  Lab 07/28/17 1046 07/29/17 0347  NA 135 138  K 4.5 4.7  CL 102 111  CO2 20* 18*  GLUCOSE 112* 105*  BUN 72* 58*  CREATININE 2.52* 2.27*  CALCIUM 8.5* 8.0*  AST 24  --   ALT 38  --   ALKPHOS 332*  --   BILITOT 0.5  --    ------------------------------------------------------------------------------------------------------------------  Cardiac Enzymes No results for input(s): TROPONINI in the last 168 hours. ------------------------------------------------------------------------------------------------------------------  RADIOLOGY:  US Venous Img Lower Bilateral  Result Date: 07/29/2017 CLINICAL DATA:  History of metastatic prostate cancer, now with bilateral lower extremity swelling. EXAM: BILATERAL LOWER EXTREMITY VENOUS DOPPLER ULTRASOUND TECHNIQUE: Gray-scale sonography with graded compression, as well as color Doppler and duplex ultrasound were performed to evaluate the lower extremity deep venous systems from the level of the common femoral vein and including the common femoral, femoral, profunda femoral, popliteal and calf veins including the posterior tibial, peroneal and gastrocnemius veins when visible. The superficial great saphenous vein was also interrogated. Spectral Doppler was  utilized to evaluate flow at rest and with distal augmentation maneuvers  in the common femoral, femoral and popliteal veins. COMPARISON:  Bilateral lower extremity venous Doppler ultrasound - 06/12/2017 FINDINGS: RIGHT LOWER EXTREMITY Common Femoral Vein: No evidence of thrombus. Normal compressibility, respiratory phasicity and response to augmentation. Saphenofemoral Junction: No evidence of thrombus. Normal compressibility and flow on color Doppler imaging. Profunda Femoral Vein: No evidence of thrombus. Normal compressibility and flow on color Doppler imaging. Femoral Vein: There is echogenic nonocclusive wall thickening/mural calcifications within mid aspect of the right femoral vein (image 16, 18 and 19), likely the sequela prior DVT. No evidence of acute DVT within the right femoral vein. Popliteal Vein: No evidence of thrombus. Normal compressibility, respiratory phasicity and response to augmentation. Calf Veins: No evidence of thrombus. Normal compressibility and flow on color Doppler imaging. Superficial Great Saphenous Vein: No evidence of thrombus. Normal compressibility. Venous Reflux:  None. Other Findings:  None. LEFT LOWER EXTREMITY Common Femoral Vein: No evidence of thrombus. Normal compressibility, respiratory phasicity and response to augmentation. Saphenofemoral Junction: No evidence of thrombus. Normal compressibility and flow on color Doppler imaging. Profunda Femoral Vein: No evidence of thrombus. Normal compressibility and flow on color Doppler imaging. Femoral Vein: No evidence of thrombus. Normal compressibility, respiratory phasicity and response to augmentation. Popliteal Vein: No evidence of thrombus. Normal compressibility, respiratory phasicity and response to augmentation. Calf Veins: No evidence of thrombus. Normal compressibility and flow on color Doppler imaging. Superficial Great Saphenous Vein: No evidence of thrombus. Normal compressibility. Venous Reflux:  None. Other  Findings:  None. IMPRESSION: 1. No evidence of acute DVT within either lower extremity. 2. Nonocclusive mural thickening/calcifications within the mid aspect of the right femoral vein, likely the sequela of prior DVT. Electronically Signed   By: Sandi Mariscal M.D.   On: 07/29/2017 08:34    ASSESSMENT AND PLAN:   Active Problems:   UTI (urinary tract infection)   1.  Sepsis related to UTI- MSSA bacteremia Repeat bl cx sent and on cefazoline per ID.   Need TEE to r/o endocarditis. Called cardio consult.  2.  Prostate cancer with metastases with bilateral nephrostomy tubes Urology consult to advise regarding further regarding nephrostomy tubes especially with drainage  3.  Essential hypertension continue Norvasc  4.  Nicotine addiction smoking cessation provided 4 minutes spent strongly recommend he stop smoking nicotine patch will be started  5. Anemia    Likely due to chronic disease, cancer, CKD stage 21monitor.  6. CKD stage 4- monitor.    All the records are reviewed and case discussed with Care Management/Social Workerr. Management plans discussed with the patient, family and they are in agreement.  CODE STATUS: DNR  TOTAL TIME TAKING CARE OF THIS PATIENT: 35 minutes.    POSSIBLE D/C IN 2-3 DAYS, DEPENDING ON CLINICAL CONDITION. Discussed with his wife in room.  Vaughan Basta M.D on 07/30/2017   Between 7am to 6pm - Pager - [phone]  After 6pm go to [email or web address] - password [email or web address] [facility]  Office  [phone]  CC: Primary care physician; Birdie Sons, MD  Note: This dictation was prepared with Dragon dictation along with smaller phrase technology. Any transcriptional errors that result from this process are unintentional.

## 2017-07-30 NOTE — Progress Notes (Signed)
Dr. Anselm Jungling is aware of 7.7 hemoglobin. Per md he ordered GI consult, collect occult stool and no blood transfusion at this time per patients request. Will continue to monitor closely

## 2017-07-31 ENCOUNTER — Inpatient Hospital Stay
Admit: 2017-07-31 | Discharge: 2017-07-31 | Disposition: A | Discharge status: Home / Self Care | Payer: Medicare Other | Attending: Cardiology | Admitting: Cardiology

## 2017-07-31 ENCOUNTER — Encounter
Admission: EM | Disposition: A | Discharge status: Hospice | Payer: Self-pay | Source: Home / Self Care | Attending: Internal Medicine

## 2017-07-31 HISTORY — PX: TEE WITHOUT CARDIOVERSION: SHX5443

## 2017-07-31 LAB — BASIC METABOLIC PANEL
ANION GAP: 10 (ref 5–15)
BUN: 42 mg/dL — ABNORMAL HIGH (ref 6–20)
CHLORIDE: 110 mmol/L (ref 101–111)
CO2: 19 mmol/L — AB (ref 22–32)
CREATININE: 2.05 mg/dL — AB (ref 0.61–1.24)
Calcium: 8.6 mg/dL — ABNORMAL LOW (ref 8.9–10.3)
GFR calc non Af Amer: 33 mL/min — ABNORMAL LOW (ref >60)
GFR, EST AFRICAN AMERICAN: 38 mL/min — AB (ref >60)
Glucose, Bld: 96 mg/dL (ref 65–99)
Potassium: 4.7 mmol/L (ref 3.5–5.1)
SODIUM: 139 mmol/L (ref 135–145)

## 2017-07-31 LAB — CBC
HEMATOCRIT: 24.6 % — AB (ref 40.0–52.0)
HEMOGLOBIN: 8.6 g/dL — AB (ref 13.0–18.0)
MCH: 31.5 pg (ref 26.0–34.0)
MCHC: 35.2 g/dL (ref 32.0–36.0)
MCV: 89.5 fL (ref 80.0–100.0)
Platelets: 262 10*3/uL (ref 150–440)
RBC: 2.74 MIL/uL — AB (ref 4.40–5.90)
RDW: 15.1 % — ABNORMAL HIGH (ref 11.5–14.5)
WBC: 8.6 10*3/uL (ref 3.8–10.6)

## 2017-07-31 LAB — AEROBIC CULTURE  (SUPERFICIAL SPECIMEN): SPECIAL REQUESTS: NORMAL

## 2017-07-31 LAB — AEROBIC CULTURE W GRAM STAIN (SUPERFICIAL SPECIMEN)

## 2017-07-31 SURGERY — ECHOCARDIOGRAM, TRANSESOPHAGEAL
Anesthesia: Moderate Sedation

## 2017-07-31 MED ORDER — LIDOCAINE VISCOUS 2 % MT SOLN
OROMUCOSAL | Status: AC
  Filled 2017-07-31: qty 15

## 2017-07-31 MED ORDER — SODIUM CHLORIDE 0.9 % IV SOLN: INTRAVENOUS | Status: DC

## 2017-07-31 MED ORDER — FENTANYL CITRATE (PF) 100 MCG/2ML IJ SOLN
INTRAMUSCULAR | Status: AC | PRN
  Administered 2017-07-31: 50 ug via INTRAVENOUS
  Administered 2017-07-31: 25 ug via INTRAVENOUS

## 2017-07-31 MED ORDER — HYDROCODONE-ACETAMINOPHEN 5-325 MG PO TABS: 1.0000 | ORAL_TABLET | ORAL | 0 refills | Status: DC | PRN

## 2017-07-31 MED ORDER — FENTANYL CITRATE (PF) 100 MCG/2ML IJ SOLN
INTRAMUSCULAR | Status: AC
  Filled 2017-07-31: qty 2

## 2017-07-31 MED ORDER — CEFAZOLIN SODIUM-DEXTROSE 1-4 GM/50ML-% IV SOLN: 1.0000 g | Freq: Two times a day (BID) | INTRAVENOUS | 0 refills | Status: AC

## 2017-07-31 MED ORDER — FERROUS SULFATE 325 (65 FE) MG PO TBEC: 325.0000 mg | DELAYED_RELEASE_TABLET | Freq: Two times a day (BID) | ORAL | 3 refills | Status: DC

## 2017-07-31 MED ORDER — ENSURE ENLIVE PO LIQD: 237.0000 mL | Freq: Three times a day (TID) | ORAL | 12 refills | Status: DC

## 2017-07-31 MED ORDER — BUTAMBEN-TETRACAINE-BENZOCAINE 2-2-14 % EX AERO
INHALATION_SPRAY | CUTANEOUS | Status: AC
  Filled 2017-07-31: qty 5

## 2017-07-31 MED ORDER — MIDAZOLAM HCL 5 MG/5ML IJ SOLN
INTRAMUSCULAR | Status: AC
  Filled 2017-07-31: qty 5

## 2017-07-31 MED ORDER — FENTANYL 50 MCG/HR TD PT72: 50.0000 ug | MEDICATED_PATCH | TRANSDERMAL | 0 refills | Status: DC

## 2017-07-31 MED ORDER — MIDAZOLAM HCL 2 MG/2ML IJ SOLN
INTRAMUSCULAR | Status: AC | PRN
  Administered 2017-07-31: 2 mg via INTRAVENOUS
  Administered 2017-07-31: 1 mg via INTRAVENOUS

## 2017-07-31 NOTE — Care Management Important Message (Signed)
Important Message  Patient Details  Name: Michael Small MRN: [id number] Date of Birth: July 24, 1955   Medicare Important Message Given:  Yes    Shelbie Ammons, RN 07/31/2017, 6:32 AM

## 2017-07-31 NOTE — CV Procedure (Signed)
   TRANSESOPHAGEAL ECHOCARDIOGRAM   NAME:  Michael Small   MRN: [id number] DOB:  1955/06/02   ADMIT DATE: 07/28/2017  INDICATIONS:   PROCEDURE:   Informed consent was obtained prior to the procedure. The risks, benefits and alternatives for the procedure were discussed and the patient comprehended these risks.  Risks include, but are not limited to, cough, sore throat, vomiting, nausea, somnolence, esophageal and stomach trauma or perforation, bleeding, low blood pressure, aspiration, pneumonia, infection, trauma to the teeth and death.    After a procedural time-out, the patient was given 3 mg versed and 75 mcg fentanyl for moderate sedation.  The oropharynx was anesthetized 1 cc of topical 1% viscous lidocaine.  The transesophageal probe was inserted in the esophagus and stomach without difficulty and multiple views were obtained.    COMPLICATIONS:    There were no immediate complications.  FINDINGS:  LEFT VENTRICLE: EF = 55%. No regional wall motion abnormalities.  RIGHT VENTRICLE: Normal size and function.   LEFT ATRIUM: Normal with no thrombus  LEFT ATRIAL APPENDAGE: No thrombus. Good flow  RIGHT ATRIUM: Normal  AORTIC VALVE:  Trileaflet. No vegetations  MITRAL VALVE:    Normal. No vegetations  TRICUSPID VALVE: Normal. No vegetations   PULMONIC VALVE: Grossly normal.  INTERATRIAL SEPTUM: No PFO or ASD. Intra-atrial septum intact with no right to left shunt with agitated saline contrast. Intra atrial septum moderately aneurysmal  PERICARDIUM: No effusion  DESCENDING AORTA:  Normal CONCLUSION: No vegetations noted.  Cannot rule out endocarditis.

## 2017-07-31 NOTE — Care Management Note (Addendum)
Case Management Note  Patient Details  Name: FREDDERICK SWANGER MRN: [id number] Date of Birth: Oct 07, 1955  Subjective/Objective: Admitted to [facility] with the diagnosis of UTI. Lives with wife, Parke Simmers [phone]). Seen Dr, Caryn Section about a month ago. [facility] [address], No home Health. No skilled nursing. No home oxygen. Rolling walker, cane, handicap ramps, and raised toilet seat in the home. Takes care of all basic activities of daily living himself, can drive, if needed. No falls. Fair appetite.                    Action/Plan: Dr. Ola Spurr has ordered Cefazolin 1 gram every 12 hours per PICC. Discussed what agency could provide the antibiotics. Agreed on [place] Will be followed by Palliative in the home.     Expected Discharge Date:                  Expected Discharge Plan:     In-House Referral:   yes  Discharge planning Services   yes  Post Acute Care Choice:   yes Choice offered to:   Mr. Wolters  DME Arranged:    DME Agency:     HH Arranged:    [place] Agency:     Status of Service:     If discussed at [place] of Stay Meetings, dates discussed:    Additional Comments:  Shelbie Ammons, RN MSN CCM Care Management [phone] 07/31/2017, 10:09 AM

## 2017-07-31 NOTE — Discharge Instructions (Signed)
Palliative care nurse to follow after d/c at home.

## 2017-07-31 NOTE — Progress Notes (Signed)
[place] INFECTIOUS DISEASE PROGRESS NOTE Date of Admission:  07/28/2017     ID: Michael Small is a 62 y.o. male with MSSA bacteremia, nephrostomy tube infection  Active Problems:   UTI (urinary tract infection)   Subjective: No fevers, feels a little stronger.   ROS  Eleven systems are reviewed and negative except per hpi  Medications:  Antibiotics Given (last 72 hours)    Date/Time Action Medication Dose Rate   07/28/17 2030 New Bag/Given   ceFAZolin (ANCEF) IVPB 1 g/50 mL premix 1 g 100 mL/hr   07/29/17 0953 New Bag/Given   ceFAZolin (ANCEF) IVPB 1 g/50 mL premix 1 g 100 mL/hr   07/29/17 2157 New Bag/Given   ceFAZolin (ANCEF) IVPB 1 g/50 mL premix 1 g 100 mL/hr   07/30/17 1122 New Bag/Given   ceFAZolin (ANCEF) IVPB 1 g/50 mL premix 1 g 100 mL/hr   07/30/17 2042 New Bag/Given  [pt request]   ceFAZolin (ANCEF) IVPB 1 g/50 mL premix 1 g 100 mL/hr   07/31/17 1014 New Bag/Given   ceFAZolin (ANCEF) IVPB 1 g/50 mL premix 1 g 100 mL/hr     . amLODipine  5 mg Oral Daily  . diphenoxylate-atropine  1 tablet Oral QHS  . feeding supplement (ENSURE ENLIVE)  237 mL Oral TID BM  . [START ON 08/01/2017] fentaNYL  50 mcg Transdermal Q72H  . nicotine  21 mg Transdermal Daily  . tamsulosin  0.4 mg Oral Daily    Objective: Vital signs in last 24 hours: Temp:  [97.5 F (36.4 C)-98.7 F (37.1 C)] 97.5 F (36.4 C) (03/04 0503) Pulse Rate:  [77-90] 77 (03/04 0503) Resp:  [18] 18 (03/04 0503) BP: (117-119)/(54-65) 117/54 (03/04 0503) SpO2:  [100 %] 100 % (03/04 0503) Constitutional: He is oriented to person, place, and time. thinHENT:  Mouth/Throat: Oropharynx is clear and moist. No oropharyngeal exudate.  Cardiovascular: Normal rate, regular rhythm and normal heart sounds.  Pulmonary/Chest: Effort normal and breath sounds normal. No respiratory distress. He has no wheezes.  Abdominal: Soft. Bowel sounds are normal. He exhibits no distension. There is no tenderness.   Lymphadenopathy: He has no cervical adenopathy.  Neurological: He is alert and oriented to person, place, and time.  Skin: Skin is warm and dry. L nephrstomy tube site with small amt purulence able to be expressed Psychiatric: He has a normal mood and affect. His behavior is normal.     Lab Results Recent Labs    07/29/17 0347 07/31/17 0537  WBC 7.5 8.6  HGB 7.7* 8.6*  HCT 23.2* 24.6*  NA 138 139  K 4.7 4.7  CL 111 110  CO2 18* 19*  BUN 58* 42*  CREATININE 2.27* 2.05*    Microbiology: Results for orders placed or performed during the hospital encounter of 07/28/17  Blood Culture (routine x 2)     Status: None (Preliminary result)   Collection Time: 07/28/17 10:46 AM  Result Value Ref Range Status   Specimen Description BLOOD FOREARM  Final   Special Requests   Final    BOTTLES DRAWN AEROBIC AND ANAEROBIC Blood Culture adequate volume   Culture   Final    NO GROWTH 3 DAYS Performed at [facility], [address] [place], [place] [address]    Report Status PENDING  Incomplete  Blood Culture (routine x 2)     Status: None (Preliminary result)   Collection Time: 07/28/17 10:51 AM  Result Value Ref Range Status   Specimen Description BLOOD  BLOOD LEFT FOREARM  Final   Special Requests   Final    BOTTLES DRAWN AEROBIC AND ANAEROBIC Blood Culture adequate volume   Culture   Final    NO GROWTH 3 DAYS Performed at [facility], [address], [place], [place] [address]    Report Status PENDING  Incomplete  Urine culture     Status: Abnormal   Collection Time: 07/28/17 11:23 AM  Result Value Ref Range Status   Specimen Description   Final    URINE, RANDOM Performed at [facility], [address], [place], [place] [address]    Special Requests   Final    NONE Performed at [facility], [address] [place], [place] [address]    Culture (A)  Final    <10,000 COLONIES/mL INSIGNIFICANT GROWTH Performed at [facility], [place] [address], [place], [place] [address]    Report Status 07/29/2017 FINAL  Final  Aerobic Culture (superficial specimen)     Status: None   Collection Time: 07/28/17  4:28 PM  Result Value Ref Range Status   Specimen Description   Final    WOUND Performed at [facility], [address] [place], [place] [address]    Special Requests   Final    Normal Performed at [facility], [address] [place],  [address]    Gram Stain   Final    MODERATE WBC PRESENT, PREDOMINANTLY PMN RARE GRAM POSITIVE COCCI Performed at [facility], [place] [address], [place],  [address]    Culture RARE STAPHYLOCOCCUS AUREUS  Final   Report Status 07/31/2017 FINAL  Final   Organism ID, Bacteria STAPHYLOCOCCUS AUREUS  Final      Susceptibility   Staphylococcus aureus - MIC*    CIPROFLOXACIN <=0.5 SENSITIVE Sensitive     ERYTHROMYCIN <=0.25 SENSITIVE Sensitive     GENTAMICIN <=0.5 SENSITIVE Sensitive     OXACILLIN <=0.25 SENSITIVE Sensitive     TETRACYCLINE <=1 SENSITIVE Sensitive     VANCOMYCIN <=0.5 SENSITIVE Sensitive     TRIMETH/SULFA <=10 SENSITIVE Sensitive     CLINDAMYCIN <=0.25 SENSITIVE Sensitive     RIFAMPIN <=0.5 SENSITIVE Sensitive     Inducible Clindamycin NEGATIVE Sensitive     * RARE STAPHYLOCOCCUS AUREUS    Studies/Results: No results found.  Assessment/Plan: Michael Small is a 62 y.o. male with MSSA bacteremia with likely source his L nephrostomy tube. He has no evidence of metastatic infection at this time. He has repeat bcx pending and has started ancef. There is a small amt of purulence at his L tube site so this will have to be changed at some point.  Discussed treatment of SAB. 3/4 - TTE neg, TEE pending. Fu bcx neg 3/1. Nephro tube cx MSSA.  Recommendations Cont ancef - see IV abx order sheet Will eventually need this exchanged but defer timing to IR and urology Since TEE neg will give 2 weeks IV followed by 2 weeks  oral keflex  Thank you very much for the consult. Will follow with you.  SHADI SESSLER   07/31/2017, 1:37 PM

## 2017-07-31 NOTE — Progress Notes (Signed)
New referral for out patient Palliative to follow at home received from [facility]. Patient information faxed to referral.  Flo Shanks RN, BSN, [facility] [place], hospital liaison [phone]

## 2017-07-31 NOTE — Discharge Summary (Signed)
[place] [facility] [place] NAME: Michael Small    MR#:  [id number]  DATE OF BIRTH:  1955/06/05  DATE OF ADMISSION:  07/28/2017 ADMITTING PHYSICIAN: Dustin Flock, MD  DATE OF DISCHARGE: 07/31/2017  PRIMARY CARE PHYSICIAN: Birdie Sons, MD    ADMISSION DIAGNOSIS:  Swelling [R60.9] Weakness [R53.1] Urinary tract infection without hematuria, site unspecified [N39.0] Blood culture positive for microorganism [R79.89]  DISCHARGE DIAGNOSIS:  Active Problems:   UTI (urinary tract infection)   SECONDARY DIAGNOSIS:   Past Medical History:  Diagnosis Date  . Chronic kidney disease   . Depression   . GERD (gastroesophageal reflux disease)   . Hyperlipidemia   . Hypertension   . Myocardial infarction [facility])    s/p stent  . Neuromuscular disorder (HCC)    numbness in arms and legs  . Prostate cancer [facility])     HOSPITAL COURSE:    1.Sepsis related to UTI- MSSA bacteremia Repeat bl cx sent and on cefazoline per ID.   Need TEE to r/o endocarditis. Called cardio consult.   TEE done, no vegetations on valve.   ID suggest total 2 weeks IV cefazoline, Picc line placed.  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  Urology suggested to follow up after infection is controlled for possible removal of bilateral nephrostomy tube.  pt does nto want any further management of the cancer and want to ave hospice in future, when it is worse.  3.Essential hypertension continue Norvasc  4.Nicotine addiction smoking cessation provided 4 minutes spent strongly recommend he stop smoking nicotine patch will be started  5. Anemia    Likely due to chronic disease, cancer, CKD stage 58monitor.   He has dark colored bowel movement, we will check for stool guaiac and hold heparin  subcutaneous, GI consult.   Patient denies use of over-the-counter pain medication or history of peptic ulcer disease. He did not had a colonoscopy done in the recent past.   No more BM, hb stable.  6. CKD stage 4- monitor.  Patient want to have hospice arrangement at home at the time of discharge, as he does not want to have aggressive management for his cancer and will not like to return to hospital for any future complications.   Currently d/c with [facility] and nurse for IV Abx, and palliative care nurse to follow at home after d./c.  DISCHARGE CONDITIONS:   Stable.  CONSULTS OBTAINED:  Treatment Team:  Leonel Ramsay, MD [name], MD Isaias Cowman, MD Teodoro Spray, MD Lucilla Lame, MD  DRUG ALLERGIES:  No Known Allergies  DISCHARGE MEDICATIONS:   Allergies as of 07/31/2017   No Known Allergies     Medication List    STOP taking these medications   cephALEXin 500 MG capsule Commonly known as:  KEFLEX     TAKE these medications   amLODipine 5 MG tablet Commonly known as:  NORVASC TAKE 1 TABLET BY MOUTH EVERY DAY   ceFAZolin 1-4 GM/50ML-% Soln Commonly known as:  ANCEF Inject 50 mLs (1 g total) into the vein every 12 (twelve) hours for 11 days.   diphenoxylate-atropine 2.5-0.025 MG tablet Commonly known as:  LOMOTIL Take 1 tablet by mouth 2 (two) times daily as needed for diarrhea or loose stools.   feeding supplement (ENSURE ENLIVE) Liqd Take 237 mLs by mouth 3 (three)  times daily between meals.   fentaNYL 50 MCG/HR Commonly known as:  DURAGESIC - dosed mcg/hr Place 1 patch (50 mcg total) onto the skin every 3 (three) days. Start taking on:  08/01/2017   ferrous sulfate 325 (65 FE) MG EC tablet Take 1 tablet (325 mg total) by mouth 2 (two) times daily.   HYDROcodone-acetaminophen 5-325 MG tablet Commonly known as:  NORCO/VICODIN Take 1 tablet by mouth every 4 (four) hours as needed for moderate pain.   tamsulosin 0.4 MG Caps  capsule Commonly known as:  FLOMAX Take 1 capsule (0.4 mg total) by mouth daily.        DISCHARGE INSTRUCTIONS:    Follow with ID in 10 days.  If you experience worsening of your admission symptoms, develop shortness of breath, life threatening emergency, suicidal or homicidal thoughts you must seek medical attention immediately by calling 911 or calling your MD immediately  if symptoms less severe.  You Must read complete instructions/literature along with all the possible adverse reactions/side effects for all the Medicines you take and that have been prescribed to you. Take any new Medicines after you have completely understood and accept all the possible adverse reactions/side effects.   Please note  You were cared for by a hospitalist during your hospital stay. If you have any questions about your discharge medications or the care you received while you were in the hospital after you are discharged, you can call the unit and asked to speak with the hospitalist on call if the hospitalist that took care of you is not available. Once you are discharged, your primary care physician will handle any further medical issues. Please note that NO REFILLS for any discharge medications will be authorized once you are discharged, as it is imperative that you return to your primary care physician (or establish a relationship with a primary care physician if you do not have one) for your aftercare needs so that they can reassess your need for medications and monitor your lab values.    Today   CHIEF COMPLAINT:   Chief Complaint  Patient presents with  . Fever    HISTORY OF PRESENT ILLNESS:  Michael Small  is a 62 y.o. male with a known history of kidney disease, depression, GERD, hypertension hyperlipidemia and metastatic prostate cancer who has bilateral nephrostomy tubes.  Who is been having fevers at home.  He was seen by urology a few days ago in the office and was noted to have UTI and  also was noted to have positive blood cultures however patient refused to be admitted he was treated with oral antibiotics.  He went to radiology department today to have nephrostomy tubes removed however there he was very weak therefore he was referred to the ER.  In the ER he was noted to have a temperature of 102.  He also has drainage around the left nephrostomy tube site greenish in color.  I discussed the case with the interventional radiologist who stated that he discussed the case with Dr. Erlene Quan who states not to do anything with the nephrostomy tubes at this point.  Patient himself denies any complaints denies abdominal pain nausea vomiting or diarrhea   VITAL SIGNS:  Blood pressure (!) 113/56, pulse 87, temperature 98.1 F (36.7 C), temperature source Oral, resp. rate 18, height 5\' 10"  (1.778 m), weight 65.3 kg (144 lb), SpO2 100 %.  I/O:    Intake/Output Summary (Last 24 hours) at 07/31/2017 1622 Last data filed at 07/31/2017 1110  Gross per 24 hour  Intake 340 ml  Output 1075 ml  Net -735 ml    PHYSICAL EXAMINATION:   GENERAL:  62 y.o.-year-old patient lying in the bed with no acute distress.  EYES: Pupils equal, round, reactive to light and accommodation. No scleral icterus. Extraocular muscles intact.  HEENT: Head atraumatic, normocephalic. Oropharynx and nasopharynx clear.  NECK:  Supple, no jugular venous distention. No thyroid enlargement, no tenderness.  LUNGS: Normal breath sounds bilaterally, no wheezing, rales,rhonchi or crepitation. No use of accessory muscles of respiration.  CARDIOVASCULAR: S1, S2 normal. No murmurs, rubs, or gallops.  ABDOMEN: Soft, nontender, nondistended. Bowel sounds present. No organomegaly or mass. B/l nephrostomy tubes. EXTREMITIES: No pedal edema, cyanosis, or clubbing.  NEUROLOGIC: Cranial nerves II through XII are intact. Muscle strength 4/5 in all extremities. Sensation intact. Gait not checked.  PSYCHIATRIC: The patient is alert and  oriented x 3.  SKIN: No obvious rash, lesion, or ulcer.     DATA REVIEW:   CBC Recent Labs  Lab 07/31/17 0537  WBC 8.6  HGB 8.6*  HCT 24.6*  PLT 262    Chemistries  Recent Labs  Lab 07/28/17 1046  07/31/17 0537  NA 135   < > 139  K 4.5   < > 4.7  CL 102   < > 110  CO2 20*   < > 19*  GLUCOSE 112*   < > 96  BUN 72*   < > 42*  CREATININE 2.52*   < > 2.05*  CALCIUM 8.5*   < > 8.6*  AST 24  --   --   ALT 38  --   --   ALKPHOS 332*  --   --   BILITOT 0.5  --   --    < > = values in this interval not displayed.    Cardiac Enzymes No results for input(s): TROPONINI in the last 168 hours.  Microbiology Results  Results for orders placed or performed during the hospital encounter of 07/28/17  Blood Culture (routine x 2)     Status: None (Preliminary result)   Collection Time: 07/28/17 10:46 AM  Result Value Ref Range Status   Specimen Description BLOOD FOREARM  Final   Special Requests   Final    BOTTLES DRAWN AEROBIC AND ANAEROBIC Blood Culture adequate volume   Culture   Final    NO GROWTH 3 DAYS Performed at [facility], [address], [place], [place] [address]    Report Status PENDING  Incomplete  Blood Culture (routine x 2)     Status: None (Preliminary result)   Collection Time: 07/28/17 10:51 AM  Result Value Ref Range Status   Specimen Description BLOOD BLOOD LEFT FOREARM  Final   Special Requests   Final    BOTTLES DRAWN AEROBIC AND ANAEROBIC Blood Culture adequate volume   Culture   Final    NO GROWTH 3 DAYS Performed at [facility], [address] [place], [place] [address]    Report Status PENDING  Incomplete  Urine culture     Status: Abnormal   Collection Time: 07/28/17 11:23 AM  Result Value Ref Range Status   Specimen Description   Final    URINE, RANDOM Performed at [facility], [address], [place], [place] [address]    Special Requests   Final    NONE Performed at [facility] [address], [place], [place] [address]    Culture (A)  Final    <  10,000 COLONIES/mL INSIGNIFICANT GROWTH Performed at [facility], [place] [address] [place], [place] [address]    Report Status 07/29/2017 FINAL  Final  Aerobic Culture (superficial specimen)     Status: None   Collection Time: 07/28/17  4:28 PM  Result Value Ref Range Status   Specimen Description   Final    WOUND Performed at [facility], [address] [place], [place] [address]    Special Requests   Final    Normal Performed at [facility], [address] [place], [place] [address]    Gram Stain   Final    MODERATE WBC PRESENT, PREDOMINANTLY PMN RARE GRAM POSITIVE COCCI Performed at [facility], [place] [address] [place], [place] [address]    Culture RARE STAPHYLOCOCCUS AUREUS  Final   Report Status 07/31/2017 FINAL  Final   Organism ID, Bacteria STAPHYLOCOCCUS AUREUS  Final      Susceptibility   Staphylococcus aureus - MIC*    CIPROFLOXACIN <=0.5 SENSITIVE Sensitive     ERYTHROMYCIN <=0.25 SENSITIVE Sensitive     GENTAMICIN <=0.5 SENSITIVE Sensitive     OXACILLIN <=0.25 SENSITIVE Sensitive     TETRACYCLINE <=1 SENSITIVE Sensitive     VANCOMYCIN <=0.5 SENSITIVE Sensitive     TRIMETH/SULFA <=10 SENSITIVE Sensitive     CLINDAMYCIN <=0.25 SENSITIVE Sensitive     RIFAMPIN <=0.5 SENSITIVE Sensitive     Inducible Clindamycin NEGATIVE Sensitive     * RARE STAPHYLOCOCCUS AUREUS    RADIOLOGY:  No results found.  EKG:   Orders placed or performed during the hospital encounter of 07/28/17  . ED EKG 12-Lead  . ED EKG 12-Lead      Management plans discussed with the patient, family and they are in agreement.  CODE STATUS:     Code Status Orders  (From admission, onward)        Start     Ordered   07/28/17 1525  Do not attempt resuscitation (DNR)  Continuous    Question Answer Comment  In the event of cardiac or respiratory ARREST Do not call a "code blue"   In  the event of cardiac or respiratory ARREST Do not perform Intubation, CPR, defibrillation or ACLS   In the event of cardiac or respiratory ARREST Use medication by any route, position, wound care, and other measures to relive pain and suffering. May use oxygen, suction and manual treatment of airway obstruction as needed for comfort.      07/28/17 1524    Code Status History    Date Active Date Inactive Code Status Order ID Comments User Context   06/12/2017 17:57 06/15/2017 18:15 Full Code [id number]  [name], MD Inpatient   06/12/2017 16:54 06/12/2017 16:56 Full Code [id number]  [name], MD HOV      TOTAL TIME TAKING CARE OF THIS PATIENT: 35 minutes.    Vaughan Basta M.D on 07/31/2017 at 4:22 PM  Between 7am to 6pm - Pager - [phone]  After 6pm go to [email or web address] - password [email or web address] [facility]  Office  [phone]  CC: Primary care physician; Birdie Sons, MD   Note: This dictation was prepared with Dragon dictation along with smaller phrase technology. Any transcriptional errors that result from this process are unintentional.

## 2017-07-31 NOTE — Progress Notes (Addendum)
PICC line unable to be placed.  Pt demands to go.  Removed IV and wheel chaired pt to car with wife.  Instructed pt to call his doctor in the morning and they will set up for pt to come in for radiology to place picc line. Dorna Bloom RN Spoke with Dr Earleen [place] and he gave me the number for Interventional Radiology [phone]).  Spoke with staff there and they do not have anyone available tonight.  I was given the number for scheduling ([phone]) and left a detailed message for them to contact the patient at home to schedule an PICC line/ mid line.  Will leave message for Assistant Director Butch Penny to follow up in AM with pt and IR for picc line. Dorna Bloom RN

## 2017-07-31 NOTE — Progress Notes (Signed)
IV RN attempted to place PICC X2 attempts. R basilic and R cephalic visualized and attempted insertion of PICC.  Unable to thread and patient RN nottifed.

## 2017-07-31 NOTE — Progress Notes (Signed)
Patient discharged home per MD order. Prescriptions given to patient and wife. All discharge instructions given to wife and all questions answered.

## 2017-07-31 NOTE — Progress Notes (Signed)
*  PRELIMINARY RESULTS* Echocardiogram Echocardiogram Transesophageal has been performed.  Sherrie Sport 07/31/2017, 3:31 PM

## 2017-08-01 ENCOUNTER — Encounter: Payer: Self-pay | Admitting: Cardiology

## 2017-08-02 ENCOUNTER — Encounter: Payer: Self-pay | Admitting: Vascular Surgery

## 2017-08-02 ENCOUNTER — Ambulatory Visit
Admission: RE | Admit: 2017-08-02 | Discharge: 2017-08-02 | Disposition: A | Discharge status: Home / Self Care | Payer: Medicare Other | Source: Ambulatory Visit | Attending: Vascular Surgery | Admitting: Vascular Surgery

## 2017-08-02 ENCOUNTER — Encounter
Admission: RE | Disposition: A | Discharge status: Home / Self Care | Payer: Self-pay | Source: Ambulatory Visit | Attending: Vascular Surgery

## 2017-08-02 DIAGNOSIS — A4101 Sepsis due to Methicillin susceptible Staphylococcus aureus: Secondary | ICD-10-CM | POA: Diagnosis not present

## 2017-08-02 DIAGNOSIS — I252 Old myocardial infarction: Secondary | ICD-10-CM | POA: Diagnosis not present

## 2017-08-02 DIAGNOSIS — Z792 Long term (current) use of antibiotics: Secondary | ICD-10-CM | POA: Diagnosis not present

## 2017-08-02 DIAGNOSIS — N400 Enlarged prostate without lower urinary tract symptoms: Secondary | ICD-10-CM | POA: Insufficient documentation

## 2017-08-02 DIAGNOSIS — Z955 Presence of coronary angioplasty implant and graft: Secondary | ICD-10-CM | POA: Insufficient documentation

## 2017-08-02 DIAGNOSIS — F329 Major depressive disorder, single episode, unspecified: Secondary | ICD-10-CM | POA: Diagnosis not present

## 2017-08-02 DIAGNOSIS — F17210 Nicotine dependence, cigarettes, uncomplicated: Secondary | ICD-10-CM | POA: Insufficient documentation

## 2017-08-02 DIAGNOSIS — Z936 Other artificial openings of urinary tract status: Secondary | ICD-10-CM | POA: Insufficient documentation

## 2017-08-02 DIAGNOSIS — N184 Chronic kidney disease, stage 4 (severe): Secondary | ICD-10-CM | POA: Insufficient documentation

## 2017-08-02 DIAGNOSIS — N136 Pyonephrosis: Secondary | ICD-10-CM | POA: Diagnosis not present

## 2017-08-02 DIAGNOSIS — Z8249 Family history of ischemic heart disease and other diseases of the circulatory system: Secondary | ICD-10-CM | POA: Diagnosis not present

## 2017-08-02 DIAGNOSIS — Z9889 Other specified postprocedural states: Secondary | ICD-10-CM | POA: Insufficient documentation

## 2017-08-02 DIAGNOSIS — Z809 Family history of malignant neoplasm, unspecified: Secondary | ICD-10-CM | POA: Diagnosis not present

## 2017-08-02 DIAGNOSIS — E785 Hyperlipidemia, unspecified: Secondary | ICD-10-CM | POA: Insufficient documentation

## 2017-08-02 DIAGNOSIS — Z960 Presence of urogenital implants: Secondary | ICD-10-CM | POA: Diagnosis not present

## 2017-08-02 DIAGNOSIS — J449 Chronic obstructive pulmonary disease, unspecified: Secondary | ICD-10-CM | POA: Insufficient documentation

## 2017-08-02 DIAGNOSIS — N390 Urinary tract infection, site not specified: Secondary | ICD-10-CM | POA: Diagnosis not present

## 2017-08-02 DIAGNOSIS — C7800 Secondary malignant neoplasm of unspecified lung: Secondary | ICD-10-CM | POA: Diagnosis not present

## 2017-08-02 DIAGNOSIS — Z833 Family history of diabetes mellitus: Secondary | ICD-10-CM | POA: Diagnosis not present

## 2017-08-02 DIAGNOSIS — Z452 Encounter for adjustment and management of vascular access device: Secondary | ICD-10-CM | POA: Diagnosis not present

## 2017-08-02 DIAGNOSIS — C61 Malignant neoplasm of prostate: Secondary | ICD-10-CM | POA: Diagnosis not present

## 2017-08-02 DIAGNOSIS — I129 Hypertensive chronic kidney disease with stage 1 through stage 4 chronic kidney disease, or unspecified chronic kidney disease: Secondary | ICD-10-CM | POA: Diagnosis not present

## 2017-08-02 DIAGNOSIS — D631 Anemia in chronic kidney disease: Secondary | ICD-10-CM | POA: Diagnosis not present

## 2017-08-02 DIAGNOSIS — Z79891 Long term (current) use of opiate analgesic: Secondary | ICD-10-CM | POA: Diagnosis not present

## 2017-08-02 DIAGNOSIS — K219 Gastro-esophageal reflux disease without esophagitis: Secondary | ICD-10-CM | POA: Diagnosis not present

## 2017-08-02 HISTORY — PX: PICC LINE INSERTION: CATH118290

## 2017-08-02 LAB — CULTURE, BLOOD (ROUTINE X 2)
CULTURE: NO GROWTH
Culture: NO GROWTH
SPECIAL REQUESTS: ADEQUATE
Special Requests: ADEQUATE

## 2017-08-02 SURGERY — PICC LINE INSERTION
Anesthesia: Moderate Sedation

## 2017-08-02 SURGICAL SUPPLY — 5 items
CATH POWERPICC DL 5F 135CM GW (CATHETERS) IMPLANT
NEEDLE ENTRY 21GA 7CM ECHOTIP (NEEDLE) ×3 IMPLANT
TRAY PICC SGL PWR INJ 4FR 55CM (TRAY / TRAY PROCEDURE) ×3 IMPLANT
WIRE FLEX T 018X145 (WIRE) IMPLANT
WIRE NITINOL .018 (WIRE) ×3 IMPLANT

## 2017-08-02 NOTE — Op Note (Signed)
OPERATIVE NOTE   PROCEDURE: 1. Insertion of a PICC line with ultrasound and fluoroscopic guidance  PRE-OPERATIVE DIAGNOSIS: Chronic urinary tract infection; metastatic prostate cancer  POST-OPERATIVE DIAGNOSIS: Same  SURGEON: Katha Cabal M.D.  ANESTHESIA: Local 1% lidocaine  ESTIMATED BLOOD LOSS: Minimal  INDICATIONS:   Michael Small is a 62 y.o. male who presents with chronic urinary tract infection.  He was in the hospital last week secondary to his urinary tract infection.  He has bilateral nephrostomy tubes secondary to his metastatic prostate cancer.  He will require a 2-week course of parenteral antibiotics and therefore requires appropriate intravenous access.  Risks and benefits for PICC line insertion were reviewed all questions answered patient has agreed to proceed..  DESCRIPTION: After obtaining full informed written consent, the patient was brought back to  to special procedures.  The right arm was sterilely prepped and draped, a sterile surgical field was created. The right basilic vein was accessed under direct ultrasound guidance without difficulty utilizing a micropuncture needle and a 0.018 wire was then advanced into the superior vena cava. A permanent image was recorded. Peel-away sheath was then placed over the wire. A single lumen peripherally inserted central venous catheter was then advanced over the wire and the wire and peel-away sheath were removed. The catheter tip was then verified to be in the superior vena cava and the catheter was secured to the skin at 34 cm with a sterile dressing including a Biopatch.  The catheter aspirates easily and flushes without difficulty. It is packed with heparinized saline.  Patient tolerated procedure well there were no complications.  COMPLICATIONS: There were no complications  CONDITION: Unchanged  Katha Cabal, M.D. [facility] [phone]   08/02/2017, 1:22 PM

## 2017-08-02 NOTE — H&P (Signed)
[facility] Admission History & Physical  MRN : [id number]  Michael Small is a 62 y.o. (03-04-1956) male who presents with chief complaint of I need an IV.  History of Present Illness: I am asked to place a PICC line by Dr. Ola Spurr.  Patient is a 62 year old gentleman with nephrostomy tubes associated with metastatic prostate cancer who has chronic urinary tract infection.  He was admitted to the hospital last week and found to have MSSA bacteremia.  Dr. Ola Spurr saw the patient felt it was secondary to his nephrostomy.  His TEE is negative and therefore the plan is for 2 weeks of IV robotics followed by 2 weeks of oral.  Because of the IV portion of his treatment he needs appropriate IV access.  Patient denies fever chills.  He denies pain at this time.  No current facility-administered medications for this encounter.     Past Medical History:  Diagnosis Date  . Chronic kidney disease   . Depression   . GERD (gastroesophageal reflux disease)   . Hyperlipidemia   . Hypertension   . Myocardial infarction [facility])    s/p stent  . Neuromuscular disorder (HCC)    numbness in arms and legs  . Prostate cancer [facility])     Past Surgical History:  Procedure Laterality Date  . CORONARY ANGIOPLASTY WITH STENT PLACEMENT  12/2011   [facility]  . CYSTOSCOPY W/ RETROGRADES Bilateral 05/24/2017   Procedure: CYSTOSCOPY WITH RETROGRADE PYELOGRAM;  Surgeon: [name], MD;  Location: [facility] ORS;  Service: Urology;  Laterality: Bilateral;  . CYSTOSCOPY WITH STENT PLACEMENT Right 05/24/2017   Procedure: CYSTOSCOPY WITH STENT PLACEMENT;  Surgeon: [name], MD;  Location: [facility] ORS;  Service: Urology;  Laterality: Right;  . IR NEPHROSTOGRAM LEFT THRU EXISTING ACCESS  07/21/2017  . IR NEPHROSTOMY PLACEMENT LEFT  06/15/2017  . IR NEPHROSTOMY PLACEMENT RIGHT  06/14/2017  . IR URETERAL STENT PLACEMENT EXISTING ACCESS LEFT  07/21/2017  . PROSTATE BIOPSY N/A  05/24/2017   Procedure: PROSTATE BIOPSY;  Surgeon: [name], MD;  Location: [facility] ORS;  Service: Urology;  Laterality: N/A;  . TEE WITHOUT CARDIOVERSION N/A 07/31/2017   Procedure: TRANSESOPHAGEAL ECHOCARDIOGRAM (TEE);  Surgeon: Teodoro Spray, MD;  Location: [facility] ORS;  Service: Cardiovascular;  Laterality: N/A;  . US ECHOCARDIOGRAPHY  12/2011   Mild LV dysfunction, EF=45%    Social History Social History   Tobacco Use  . Smoking status: Current Every Day Smoker    Packs/day: 1.00    Years: 48.00    Pack years: 48.00    Types: Cigars  . Smokeless tobacco: Never Used  . Tobacco comment: used to amoke 2PPD- lately cut down to 1/2 PPD  Substance Use Topics  . 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 Drug use: No    Family History Family History  Problem Relation Age of Onset  . Cancer Mother   . Hypertension Mother   . Diabetes Father   . Heart disease Father   . Cancer Paternal Grandfather   . Prostate cancer Neg Hx   . Bladder Cancer Neg Hx   . Kidney cancer Neg Hx   No family history of bleeding/clotting disorders, porphyria or autoimmune disease   No Known Allergies   REVIEW OF SYSTEMS (Negative unless checked)  Constitutional: [] Weight loss  [] Fever  [] Chills Cardiac: [] Chest pain   []   Chest pressure   [] Palpitations   [] Shortness of breath when laying flat   [] Shortness of breath at rest   [] Shortness of breath with exertion. Vascular:  [] Pain in legs with walking   [] Pain in legs at rest   [] Pain in legs when laying flat   [] Claudication   [] Pain in feet when walking  [] Pain in feet at rest  [] Pain in feet when laying flat   [] History of DVT   [] Phlebitis   [] Swelling in legs   [] Varicose veins   [] Non-healing ulcers Pulmonary:   [] Uses home oxygen   [] Productive cough   [] Hemoptysis   [] Wheeze  [] COPD   [] Asthma Neurologic:  [] Dizziness  [] Blackouts   [] Seizures   [] History of stroke    [] History of TIA  [] Aphasia   [] Temporary blindness   [] Dysphagia   [] Weakness or numbness in arms   [] Weakness or numbness in legs Musculoskeletal:  [] Arthritis   [] Joint swelling   [] Joint pain   [] Low back pain Hematologic:  [] Easy bruising  [] Easy bleeding   [] Hypercoagulable state   [] Anemic  [] Hepatitis Gastrointestinal:  [] Blood in stool   [] Vomiting blood  [] Gastroesophageal reflux/heartburn   [] Difficulty swallowing. Genitourinary:  [] Chronic kidney disease   [] Difficult urination  [] Frequent urination  [] Burning with urination   [] Blood in urine Skin:  [] Rashes   [] Ulcers   [] Wounds Psychological:  [] History of anxiety   []  History of major depression.  Physical Examination  There were no vitals filed for this visit. There is no height or weight on file to calculate BMI. Gen: WD/WN, NAD Head: [place]/AT, No temporalis wasting. Prominent temp pulse not noted. Ear/Nose/Throat: Hearing grossly intact, nares w/o erythema or drainage, oropharynx w/o Erythema/Exudate,  Eyes: Conjunctiva clear, sclera non-icteric Neck: Trachea midline.  No JVD.  Pulmonary:  Good air movement, respirations not labored, no use of accessory muscles.  Cardiac: RRR, normal S1, S2. Vascular:  Vessel Right Left  Radial Palpable Palpable  Gastrointestinal: soft, non-tender/non-distended. No guarding/reflex.  Musculoskeletal: M/S 5/5 throughout.  Extremities without ischemic changes.  No deformity or atrophy.  Neurologic: Sensation grossly intact in extremities.  Symmetrical.  Speech is fluent. Motor exam as listed above. Psychiatric: Judgment intact, Mood & affect appropriate for pt's clinical situation. Dermatologic: No rashes or ulcers noted.  No cellulitis or open wounds. Lymph : No Cervical, Axillary, or Inguinal lymphadenopathy.     CBC Lab Results  Component Value Date   WBC 8.6 07/31/2017   HGB 8.6 (L) 07/31/2017   HCT 24.6 (L) 07/31/2017   MCV 89.5 07/31/2017   PLT 262 07/31/2017    BMET     Component Value Date/Time   NA 139 07/31/2017 0537   NA 139 11/15/2016 0858   NA 137 01/20/2012 0615   K 4.7 07/31/2017 0537   K 4.1 01/20/2012 0615   CL 110 07/31/2017 0537   CL 103 01/20/2012 0615   CO2 19 (L) 07/31/2017 0537   CO2 24 01/20/2012 0615   GLUCOSE 96 07/31/2017 0537   GLUCOSE 101 (H) 01/20/2012 0615   BUN 42 (H) 07/31/2017 0537   BUN 35 (H) 06/23/2017 1037   BUN 15 01/20/2012 0615   CREATININE 2.05 (H) 07/31/2017 0537   CREATININE 2.95 (H) 04/24/2017 1107   CALCIUM 8.6 (L) 07/31/2017 0537   CALCIUM 8.9 01/20/2012 0615   GFRNONAA 33 (L) 07/31/2017 0537   GFRNONAA 20 (L) 04/17/2017 1638   GFRAA 38 (L) 07/31/2017 0537   GFRAA 23 (L) 04/17/2017 1638   Estimated  Creatinine Clearance: 34.5 mL/min (A) (by C-G formula based on SCr of 2.05 mg/dL (H)).  COAG Lab Results  Component Value Date   INR 1.22 06/12/2017    Radiology Ct Abdomen Pelvis Wo Contrast  Result Date: 07/28/2017 CLINICAL DATA:  Fever. Redness and drainage at nephrostomy tube sites. History of prostate cancer. EXAM: CT ABDOMEN AND PELVIS WITHOUT CONTRAST TECHNIQUE: Multidetector CT imaging of the abdomen and pelvis was performed following the standard protocol without IV contrast. COMPARISON:  CT abdomen pelvis dated April 27, 2017. FINDINGS: Lower chest: No acute abnormality. Hepatobiliary: No focal liver abnormality is seen. No gallstones, gallbladder wall thickening, or biliary dilatation. Pancreas: Unremarkable. No pancreatic ductal dilatation or surrounding inflammatory changes. Spleen: Normal in size without focal abnormality. Adrenals/Urinary Tract: Unchanged left adrenal adenoma. Bilateral nephrostomy tubes in appropriate position. Bilateral double-J ureteral stents in appropriate position. No hydronephrosis. The bladder is decompressed. Stomach/Bowel: Small hiatal hernia. The stomach is otherwise within normal limits. Appendix is normal. Mild colonic stool burden. Unchanged wall thickening of the  rectum. No bowel obstruction. Vascular/Lymphatic: Aortic atherosclerosis. Retroperitoneal, common iliac, and external iliac lymphadenopathy appears stable to slightly decreased in size. For example, the right external iliac lymph node now measures 2.0 cm in short axis, previously 3.2 cm. Reproductive: Prostatomegaly, unchanged. Other: Abnormal soft tissue stranding in the retroperitoneum, pelvic sidewalls, and perirectal and presacral spaces is similar to prior study. No pneumoperitoneum or free fluid. Musculoskeletal: New diffuse bony heterogeneity of the visualized thoracolumbar spine and pelvis, concerning for osseous metastatic disease. New Schmorl's node versus small superior endplate compression deformity along the right aspect of the L4 vertebral body. IMPRESSION: 1. Appropriately positioned bilateral nephrostomy tubes and double-J ureteral stents. No hydronephrosis. No soft tissue inflammation or fluid collection surrounding the nephrostomy tubes at the skin surface. 2. New diffuse bony heterogeneity of the visualized thoracolumbar spine and pelvis, concerning for osseous metastatic disease. 3. New Schmorl's node versus small superior endplate compression deformity along the right aspect of the L4 vertebral body. 4. Stable to interval decrease in size of retroperitoneal, common iliac, and external iliac lymphadenopathy. 5. Relatively unchanged abnormal soft tissue stranding in the retroperitoneum, pelvic sidewalls, and perirectal and presacral spaces. 6.  Aortic atherosclerosis (ICD10-I70.0). Electronically Signed   By: Titus Dubin M.D.   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 Normal heart size and vascularity. Chronic interstitial prominence diffusely. No  focal collapse, consolidation, pneumonia, edema, effusion, or pneumothorax. Stable right hilar fullness suspicious for right hilar adenopathy. 1.9 cm right upper lobe irregular nodule by CT is grossly stable. IMPRESSION: Stable COPD/emphysema pattern and other chronic findings as above. No superimposed acute process or significant interval change by plain radiography. Electronically Signed   By: Jerilynn Mages.  Shick M.D.   On: 07/28/2017 11:34   US Venous Img Lower Bilateral  Result Date: 07/29/2017 CLINICAL DATA:  History of metastatic prostate cancer, now with bilateral lower extremity swelling. EXAM: BILATERAL LOWER EXTREMITY VENOUS DOPPLER ULTRASOUND TECHNIQUE: Gray-scale sonography with graded compression, as well as color Doppler and duplex ultrasound were performed to evaluate the lower extremity deep venous systems from the level of the common femoral vein and including the common femoral, femoral, profunda femoral, popliteal and calf veins including the posterior tibial, peroneal and gastrocnemius veins when visible. The superficial great saphenous vein was  also interrogated. Spectral Doppler was utilized to evaluate flow at rest and with distal augmentation maneuvers in the common femoral, femoral and popliteal veins. COMPARISON:  Bilateral lower extremity venous Doppler ultrasound - 06/12/2017 FINDINGS: RIGHT LOWER EXTREMITY Common Femoral Vein: No evidence of thrombus. Normal compressibility, respiratory phasicity and response to augmentation. Saphenofemoral Junction: No evidence of thrombus. Normal compressibility and flow on color Doppler imaging. Profunda Femoral Vein: No evidence of thrombus. Normal compressibility and flow on color Doppler imaging. Femoral Vein: There is echogenic nonocclusive wall thickening/mural calcifications within mid aspect of the right femoral vein (image 16, 18 and 19), likely the sequela prior DVT. No evidence of acute DVT within the right femoral vein. Popliteal Vein: No evidence  of thrombus. Normal compressibility, respiratory phasicity and response to augmentation. Calf Veins: No evidence of thrombus. Normal compressibility and flow on color Doppler imaging. Superficial Great Saphenous Vein: No evidence of thrombus. Normal compressibility. Venous Reflux:  None. Other Findings:  None. LEFT LOWER EXTREMITY Common Femoral Vein: No evidence of thrombus. Normal compressibility, respiratory phasicity and response to augmentation. Saphenofemoral Junction: No evidence of thrombus. Normal compressibility and flow on color Doppler imaging. Profunda Femoral Vein: No evidence of thrombus. Normal compressibility and flow on color Doppler imaging. Femoral Vein: No evidence of thrombus. Normal compressibility, respiratory phasicity and response to augmentation. Popliteal Vein: No evidence of thrombus. Normal compressibility, respiratory phasicity and response to augmentation. Calf Veins: No evidence of thrombus. Normal compressibility and flow on color Doppler imaging. Superficial Great Saphenous Vein: No evidence of thrombus. Normal compressibility. Venous Reflux:  None. Other Findings:  None. IMPRESSION: 1. No evidence of acute DVT within either lower extremity. 2. Nonocclusive mural thickening/calcifications within the mid aspect of the right femoral vein, likely the sequela of prior DVT. Electronically Signed   By: Sandi Mariscal M.D.   On: 07/29/2017 08:34   Ir Nephrostogram Left Thru Existing Access  Result Date: 07/21/2017 CLINICAL DATA:  History metastatic prostate cancer with obstructive bilateral uropathy post failed right-sided double-J ureteral stent placement. Post successful ultrasound and fluoroscopic guided placement of bilateral percutaneous nephrostomy catheters on 06/14/2017. Patient presents today for bilateral antegrade nephrostograms with potential placement of left-sided ureteral stent. EXAM: 1. BILATERAL NEPHROSTOGRAM. 2. LEFT-SIDED URETERAL STENT PLACEMENT 3. BILATERAL  PERCUTANEOUS NEPHROSTOMY CATHETER EXCHANGE COMPARISON:  Bilateral ultrasound fluoroscopic guided nephrostomy catheter placement - 06/14/2017 MEDICATIONS: Ciprofloxacin 400 mg IV; The antibiotics were administered within 1 hour of the procedure. ANESTHESIA/SEDATION: Moderate (conscious) sedation was employed during this procedure. A total of Versed 1 mg and Fentanyl 50 mcg was administered intravenously. Moderate Sedation Time: 22 minutes. The patient's level of consciousness and vital signs were monitored continuously by radiology nursing throughout the procedure under my direct supervision. CONTRAST:  A total of 50 cc Isovue-300 was administered into the bilateral collecting systems. FLUOROSCOPY TIME:  4 minutes (40 mGy) COMPLICATIONS: None immediate PROCEDURE: The procedure, risks, benefits, and alternatives were explained to the patient. Questions regarding the procedure were encouraged and answered. The patient understands and consents to the procedure. The nephrostomy tubes and surrounding skin were prepped with Betadine in a sterile fashion, and a sterile drape was applied covering the operative field. A sterile gown and sterile gloves were used for the procedure. Local anesthesia was provided with 1% Lidocaine. Antegrade nephrostogram was performed via the existing right-sided percutaneous nephrostomy catheter demonstrating patency of the right-sided ureteral stent with passage of contrast through and around the stent to the level of the urinary bladder. The right-sided nephrostomy catheter was capped  Tension was now paid towards the left-sided nephrostomy Initially, a left-sided antegrade nephrostogram was performed via the existing left-sided nephrostomy demonstrated patency of the left ureter to the level of the urinary bladder however no was made of a long segment narrowing/stricture of the distal aspect the left ureter. External portion of the left-sided nephrostomy catheter was cut and the catheter was  cannulated with a short Amplatz wire which was coiled within the left renal pelvis. Under intermittent fluoroscopic guidance, the existing nephrostomy catheter was exchanged for a 9 French radiopaque tip vascular sheath was advanced to the level of the left renal pelvis. With the use of a regular glidewire, a Kumpe the catheter was advanced through the left ureter to the level of the urinary bladder. Contrast injection confirmed appropriate positioning. Next, a Amplatz wire was coiled within the urinary bladder and the Kumpe the catheter was utilized for measurement purposes. Under intermittent fluoroscopic guided exchange, the vascular sheath was exchanged for an 8-French, 22 cm ureteral stent was then advanced over an Amplatz superstiff guidewire. The distal portion was formed at the level of the bladder. Proximal portion was then formed at the level of the renal collecting system. At this point, the safety wire was utilized to replace an existing 10.2 [place] percutaneous nephrostomy catheter within the renal collecting system. Postprocedural images were obtained in various obliquities. Left-sided nephrostomy catheter was capped and dressings were applied. The patient tolerated the above procedure well without immediate postprocedural complication. FINDINGS: Nephrostogram demonstrates appropriate position existing bilateral percutaneous nephrostomy catheters. Bilateral nephrostograms demonstrate mild residual hydronephrosis and ureterectasis. There is marked tortuosity of the mid and distal aspect of the bilateral ureters. No discrete intraluminal filling defects are seen within either collecting system or ureter. A Foley catheter is within the urinary bladder. Bilateral ureteral stent was able to be placed spanning from the collecting system to the bladder. The left ureteral stent is 8 Fr, 28 cm, and the right is 8 Fr, 24 cm. The bilateral 10.2 French nephrostomy catheter were replaced with coils formed and  locked within the renal pelvis. IMPRESSION: 1. Successful placement of left-sided 22 cm ureteral stent. 2. Successful fluoroscopic guided exchange of left-sided percutaneous nephrostomy catheter. The left-sided nephrostomy catheter was capped for a trial of internalization. 3. Wide patency of the existing right-sided ureteral stent. The right-sided nephrostomy catheter was capped for a trial of internalization. PLAN: - The bilateral nephrostomy catheters were capped for a trial of internalization. The patient was given 2 gravity bags and instructed to reconnect either one or both of the nephrostomy catheters if he were to develop recurrent flank pain and/or pericatheter leaking as this could herald a sign of stent failure. The patient and the patient's wife demonstrated excellent understanding of this conversation. - The patient will return to the interventional radiology department next week. Assuming he passes this trial of internalization, repeat bilateral antegrade nephrostogram will be performed followed by fluoroscopic guided bilateral nephrostomy catheter removals. Electronically Signed   By: Sandi Mariscal M.D.   On: 07/21/2017 16:22   Ir Ureteral Stent Placement Existing Access Left  Result Date: 07/21/2017 CLINICAL DATA:  History metastatic prostate cancer with obstructive bilateral uropathy post failed right-sided double-J ureteral stent placement. Post successful ultrasound and fluoroscopic guided placement of bilateral percutaneous nephrostomy catheters on 06/14/2017. Patient presents today for bilateral antegrade nephrostograms with potential placement of left-sided ureteral stent. EXAM: 1. BILATERAL NEPHROSTOGRAM. 2. LEFT-SIDED URETERAL STENT PLACEMENT 3. BILATERAL PERCUTANEOUS NEPHROSTOMY CATHETER EXCHANGE COMPARISON:  Bilateral ultrasound fluoroscopic guided nephrostomy  catheter placement - 06/14/2017 MEDICATIONS: Ciprofloxacin 400 mg IV; The antibiotics were administered within 1 hour of the  procedure. ANESTHESIA/SEDATION: Moderate (conscious) sedation was employed during this procedure. A total of Versed 1 mg and Fentanyl 50 mcg was administered intravenously. Moderate Sedation Time: 22 minutes. The patient's level of consciousness and vital signs were monitored continuously by radiology nursing throughout the procedure under my direct supervision. CONTRAST:  A total of 50 cc Isovue-300 was administered into the bilateral collecting systems. FLUOROSCOPY TIME:  4 minutes (40 mGy) COMPLICATIONS: None immediate PROCEDURE: The procedure, risks, benefits, and alternatives were explained to the patient. Questions regarding the procedure were encouraged and answered. The patient understands and consents to the procedure. The nephrostomy tubes and surrounding skin were prepped with Betadine in a sterile fashion, and a sterile drape was applied covering the operative field. A sterile gown and sterile gloves were used for the procedure. Local anesthesia was provided with 1% Lidocaine. Antegrade nephrostogram was performed via the existing right-sided percutaneous nephrostomy catheter demonstrating patency of the right-sided ureteral stent with passage of contrast through and around the stent to the level of the urinary bladder. The right-sided nephrostomy catheter was capped Tension was now paid towards the left-sided nephrostomy Initially, a left-sided antegrade nephrostogram was performed via the existing left-sided nephrostomy demonstrated patency of the left ureter to the level of the urinary bladder however no was made of a long segment narrowing/stricture of the distal aspect the left ureter. External portion of the left-sided nephrostomy catheter was cut and the catheter was cannulated with a short Amplatz wire which was coiled within the left renal pelvis. Under intermittent fluoroscopic guidance, the existing nephrostomy catheter was exchanged for a 9 French radiopaque tip vascular sheath was advanced  to the level of the left renal pelvis. With the use of a regular glidewire, a Kumpe the catheter was advanced through the left ureter to the level of the urinary bladder. Contrast injection confirmed appropriate positioning. Next, a Amplatz wire was coiled within the urinary bladder and the Kumpe the catheter was utilized for measurement purposes. Under intermittent fluoroscopic guided exchange, the vascular sheath was exchanged for an 8-French, 22 cm ureteral stent was then advanced over an Amplatz superstiff guidewire. The distal portion was formed at the level of the bladder. Proximal portion was then formed at the level of the renal collecting system. At this point, the safety wire was utilized to replace an existing 10.2 [place] percutaneous nephrostomy catheter within the renal collecting system. Postprocedural images were obtained in various obliquities. Left-sided nephrostomy catheter was capped and dressings were applied. The patient tolerated the above procedure well without immediate postprocedural complication. FINDINGS: Nephrostogram demonstrates appropriate position existing bilateral percutaneous nephrostomy catheters. Bilateral nephrostograms demonstrate mild residual hydronephrosis and ureterectasis. There is marked tortuosity of the mid and distal aspect of the bilateral ureters. No discrete intraluminal filling defects are seen within either collecting system or ureter. A Foley catheter is within the urinary bladder. Bilateral ureteral stent was able to be placed spanning from the collecting system to the bladder. The left ureteral stent is 8 Fr, 28 cm, and the right is 8 Fr, 24 cm. The bilateral 10.2 French nephrostomy catheter were replaced with coils formed and locked within the renal pelvis. IMPRESSION: 1. Successful placement of left-sided 22 cm ureteral stent. 2. Successful fluoroscopic guided exchange of left-sided percutaneous nephrostomy catheter. The left-sided nephrostomy catheter was  capped for a trial of internalization. 3. Wide patency of the existing right-sided ureteral stent. The  right-sided nephrostomy catheter was capped for a trial of internalization. PLAN: - The bilateral nephrostomy catheters were capped for a trial of internalization. The patient was given 2 gravity bags and instructed to reconnect either one or both of the nephrostomy catheters if he were to develop recurrent flank pain and/or pericatheter leaking as this could herald a sign of stent failure. The patient and the patient's wife demonstrated excellent understanding of this conversation. - The patient will return to the interventional radiology department next week. Assuming he passes this trial of internalization, repeat bilateral antegrade nephrostogram will be performed followed by fluoroscopic guided bilateral nephrostomy catheter removals. Electronically Signed   By: Sandi Mariscal M.D.   On: 07/21/2017 16:22    Assessment/Plan 1.UTI- MSSA bacteremia  ID suggest total 2 weeks IV cefazoline, Picc line to be placed today.  Risks and benefits were reviewed patient has agreed to proceed.  2.Prostate cancer with metastases with bilateral nephrostomy tubes Patient involved with hospice.  He is to follow with his oncologist as well as urology for palliative care.  3.Essential hypertension continue Norvasc  4.CKD stage 4- monitor.  Avoid nephrotoxic drugs.  No contrast will be used for PICC line placement  5. Anemia.   hb has been stable.  Continue iron supplementation       Hortencia Pilar, MD  08/02/2017 12:59 PM

## 2017-08-03 ENCOUNTER — Telehealth: Payer: Self-pay | Admitting: Family Medicine

## 2017-08-03 NOTE — Telephone Encounter (Signed)
The referral is for Palative care in patient's home instead of Hospice care.

## 2017-08-03 NOTE — Telephone Encounter (Signed)
[place] for Hospice referral.

## 2017-08-03 NOTE — Telephone Encounter (Signed)
[name] with Hospice stated they received a referral  from [facility] for Hospice services and Langley Gauss wanted to make sure Dr. Caryn Section is in agreement. Please advise. Thanks TNP

## 2017-08-05 ENCOUNTER — Encounter: Payer: Self-pay | Admitting: Infectious Diseases

## 2017-08-05 DIAGNOSIS — T83512A Infection and inflammatory reaction due to nephrostomy catheter, initial encounter: Secondary | ICD-10-CM | POA: Insufficient documentation

## 2017-08-08 ENCOUNTER — Telehealth: Payer: Self-pay | Admitting: Family Medicine

## 2017-08-08 ENCOUNTER — Ambulatory Visit: Payer: Medicare Other | Admitting: Gastroenterology

## 2017-08-08 DIAGNOSIS — C61 Malignant neoplasm of prostate: Secondary | ICD-10-CM | POA: Diagnosis not present

## 2017-08-08 NOTE — Telephone Encounter (Signed)
Debra with Hospice stated that pt was evaluated for palliative care today and they recommended Hospice care instead. Hilda Blades stated that she has faxed over a Hospice referral form and if Dr. Caryn Section agrees Hilda Blades request the form be completed and faxed back. Please advise. Thanks TNP

## 2017-08-09 DIAGNOSIS — C61 Malignant neoplasm of prostate: Secondary | ICD-10-CM | POA: Diagnosis not present

## 2017-08-09 DIAGNOSIS — T83512A Infection and inflammatory reaction due to nephrostomy catheter, initial encounter: Secondary | ICD-10-CM | POA: Diagnosis not present

## 2017-08-09 DIAGNOSIS — B9561 Methicillin susceptible Staphylococcus aureus infection as the cause of diseases classified elsewhere: Secondary | ICD-10-CM | POA: Insufficient documentation

## 2017-08-09 DIAGNOSIS — R7881 Bacteremia: Secondary | ICD-10-CM | POA: Diagnosis not present

## 2017-08-10 ENCOUNTER — Telehealth: Payer: Self-pay | Admitting: *Deleted

## 2017-08-10 NOTE — Telephone Encounter (Signed)
In review of the notes, it appears as if Dr. Caryn Section (PCP) has fielded this request. If we find that this is not the case, we can certainly proceed with ordering hospice services for this patient.

## 2017-08-10 NOTE — Telephone Encounter (Signed)
Debra from hospice called to report that patient is declining and would be better suited for Hospice Care at this point if Dr. Mike Gip agrees. If yes please fax orders to Hospice.

## 2017-08-11 ENCOUNTER — Telehealth: Payer: Self-pay | Admitting: *Deleted

## 2017-08-11 ENCOUNTER — Telehealth: Payer: Self-pay | Admitting: Radiology

## 2017-08-11 DIAGNOSIS — R109 Unspecified abdominal pain: Secondary | ICD-10-CM

## 2017-08-11 MED ORDER — HYDROCODONE-ACETAMINOPHEN 10-325 MG PO TABS: 1.0000 | ORAL_TABLET | ORAL | 0 refills | Status: DC | PRN

## 2017-08-11 NOTE — Telephone Encounter (Signed)
Divina from Hospice called stating patient is being admitted today. Divina stated patient is in a lot of pain and is requesting an rx for a different pain mediation or a stronger dose of Norco. Patient has stopped taking fentanyl patch due to it not working. Divina would like a call back to let her know when rx has been sent to pt's pharmacy. Please advise?

## 2017-08-11 NOTE — Telephone Encounter (Signed)
Discussed repeating nephrostograms with possible nephrostomy tube removal with pt's wife per Dr Bernardo Heater. Pt would like to proceed with procedure. This will be scheduled with IR.

## 2017-08-11 NOTE — Telephone Encounter (Signed)
Have sent prescription for 10mg  hydrocod[facility] to [facility]

## 2017-08-14 ENCOUNTER — Other Ambulatory Visit: Payer: Self-pay | Admitting: Internal Medicine

## 2017-08-14 NOTE — Telephone Encounter (Signed)
I assume you followed up.

## 2017-08-15 ENCOUNTER — Ambulatory Visit: Payer: Medicare Other | Admitting: Gastroenterology

## 2017-08-18 ENCOUNTER — Ambulatory Visit
Admission: RE | Admit: 2017-08-18 | Discharge: 2017-08-18 | Disposition: A | Discharge status: Home / Self Care | Payer: Medicare Other | Source: Ambulatory Visit | Attending: Urology | Admitting: Urology

## 2017-08-18 ENCOUNTER — Encounter: Payer: Self-pay | Admitting: Interventional Radiology

## 2017-08-18 DIAGNOSIS — Z436 Encounter for attention to other artificial openings of urinary tract: Secondary | ICD-10-CM | POA: Diagnosis not present

## 2017-08-18 DIAGNOSIS — N135 Crossing vessel and stricture of ureter without hydronephrosis: Secondary | ICD-10-CM

## 2017-08-18 HISTORY — PX: IR NEPHROSTOGRAM LEFT THRU EXISTING ACCESS: IMG6061

## 2017-08-18 HISTORY — PX: IR NEPHROSTOGRAM RIGHT THRU EXISTING ACCESS: IMG6062

## 2017-08-18 MED ORDER — SODIUM CHLORIDE 0.9 % IJ SOLN
INTRAMUSCULAR | Status: AC
  Filled 2017-08-18: qty 50

## 2017-08-18 MED ORDER — IOPAMIDOL (ISOVUE-300) INJECTION 61%
30.0000 mL | Freq: Once | INTRAVENOUS | Status: AC | PRN
  Administered 2017-08-18: 11:00:00 10 mL via INTRAVENOUS

## 2017-08-18 MED ORDER — LIDOCAINE HCL (PF) 1 % IJ SOLN
INTRAMUSCULAR | Status: AC
  Filled 2017-08-18: qty 30

## 2017-08-18 NOTE — Discharge Instructions (Signed)
Incision Care, Adult An incision is a surgical cut that is made through your skin. Most incisions are closed after surgery. Your incision may be closed with stitches (sutures), staples, skin glue, or adhesive strips. You may need to return to your health care provider to have sutures or staples removed. This may occur several days to several weeks after your surgery. The incision needs to be cared for properly to prevent infection. How to care for your incision Incision care   Follow instructions from your health care provider about how to take care of your incision. Make sure you: ? Wash your hands with soap and water before you change the bandage (dressing). If soap and water are not available, use hand sanitizer. ? Change your dressing as told by your health care provider. ? Leave sutures, skin glue, or adhesive strips in place. These skin closures may need to stay in place for 2 weeks or longer. If adhesive strip edges start to loosen and curl up, you may trim the loose edges. Do not remove adhesive strips completely unless your health care provider tells you to do that.  Check your incision area every day for signs of infection. Check for: ? More redness, swelling, or pain. ? More fluid or blood. ? Warmth. ? Pus or a bad smell.  Ask your health care provider how to clean the incision. This may include: ? Using mild soap and water. ? Using a clean towel to pat the incision dry after cleaning it. ? Applying a cream or ointment. Do this only as told by your health care provider. ? Covering the incision with a clean dressing.  Ask your health care provider when you can leave the incision uncovered.  Do not take baths, swim, or use a hot tub until your health care provider approves. Ask your health care provider if you can take showers. You may only be allowed to take sponge baths for bathing. Medicines  If you were prescribed an antibiotic medicine, cream, or ointment, take or apply the  antibiotic as told by your health care provider. Do not stop taking or applying the antibiotic even if your condition improves.  Take over-the-counter and prescription medicines only as told by your health care provider. General instructions  Limit movement around your incision to improve healing. ? Avoid straining, lifting, or exercise for the first month, or for as long as told by your health care provider. ? Follow instructions from your health care provider about returning to your normal activities. ? Ask your health care provider what activities are safe.  Protect your incision from the sun when you are outside for the first 6 months, or for as long as told by your health care provider. Apply sunscreen around the scar or cover it up.  Keep all follow-up visits as told by your health care provider. This is important. Contact a health care provider if:  Your have more redness, swelling, or pain around the incision.  You have more fluid or blood coming from the incision.  Your incision feels warm to the touch.  You have pus or a bad smell coming from the incision.  You have a fever or shaking chills.  You are nauseous or you vomit.  You are dizzy.  Your sutures or staples come undone. Get help right away if:  You have a red streak coming from your incision.  Your incision bleeds through the dressing and the bleeding does not stop with gentle pressure.  The edges of   your incision open up and separate.  You have severe pain.  You have a rash.  You are confused.  You faint.  You have trouble breathing and a fast heartbeat. This information is not intended to replace advice given to you by your health care provider. Make sure you discuss any questions you have with your health care provider. Document Released: 12/03/2004 Document Revised: 01/22/2016 Document Reviewed: 12/02/2015 Elsevier Interactive Patient Education  2018 Elsevier Inc.  

## 2017-08-25 ENCOUNTER — Encounter (INDEPENDENT_AMBULATORY_CARE_PROVIDER_SITE_OTHER): Payer: Medicare Other | Admitting: Family Medicine

## 2017-08-25 DIAGNOSIS — C785 Secondary malignant neoplasm of large intestine and rectum: Secondary | ICD-10-CM

## 2017-08-25 DIAGNOSIS — C7951 Secondary malignant neoplasm of bone: Secondary | ICD-10-CM | POA: Diagnosis not present

## 2017-08-25 DIAGNOSIS — C7800 Secondary malignant neoplasm of unspecified lung: Secondary | ICD-10-CM | POA: Diagnosis not present

## 2017-08-25 DIAGNOSIS — C61 Malignant neoplasm of prostate: Secondary | ICD-10-CM | POA: Diagnosis not present

## 2017-08-28 DIAGNOSIS — Z936 Other artificial openings of urinary tract status: Secondary | ICD-10-CM | POA: Diagnosis not present

## 2017-08-28 DIAGNOSIS — I129 Hypertensive chronic kidney disease with stage 1 through stage 4 chronic kidney disease, or unspecified chronic kidney disease: Secondary | ICD-10-CM | POA: Diagnosis not present

## 2017-08-28 DIAGNOSIS — I452 Bifascicular block: Secondary | ICD-10-CM | POA: Diagnosis not present

## 2017-08-28 DIAGNOSIS — D631 Anemia in chronic kidney disease: Secondary | ICD-10-CM | POA: Diagnosis not present

## 2017-08-28 DIAGNOSIS — C61 Malignant neoplasm of prostate: Secondary | ICD-10-CM | POA: Diagnosis not present

## 2017-08-28 DIAGNOSIS — F329 Major depressive disorder, single episode, unspecified: Secondary | ICD-10-CM | POA: Diagnosis not present

## 2017-08-28 DIAGNOSIS — K219 Gastro-esophageal reflux disease without esophagitis: Secondary | ICD-10-CM | POA: Diagnosis not present

## 2017-08-28 DIAGNOSIS — A4101 Sepsis due to Methicillin susceptible Staphylococcus aureus: Secondary | ICD-10-CM | POA: Diagnosis not present

## 2017-08-28 DIAGNOSIS — N390 Urinary tract infection, site not specified: Secondary | ICD-10-CM | POA: Diagnosis not present

## 2017-08-28 DIAGNOSIS — F17210 Nicotine dependence, cigarettes, uncomplicated: Secondary | ICD-10-CM | POA: Diagnosis not present

## 2017-08-28 DIAGNOSIS — I252 Old myocardial infarction: Secondary | ICD-10-CM | POA: Diagnosis not present

## 2017-08-28 DIAGNOSIS — N184 Chronic kidney disease, stage 4 (severe): Secondary | ICD-10-CM | POA: Diagnosis not present

## 2017-08-29 ENCOUNTER — Other Ambulatory Visit: Payer: Self-pay | Admitting: Family Medicine

## 2017-08-30 ENCOUNTER — Telehealth: Payer: Self-pay | Admitting: *Deleted

## 2017-08-30 ENCOUNTER — Telehealth: Payer: Self-pay | Admitting: Family Medicine

## 2017-08-30 MED ORDER — MORPHINE SULFATE 15 MG PO TABS: 15.0000 mg | ORAL_TABLET | ORAL | 0 refills | Status: DC | PRN

## 2017-08-30 NOTE — Telephone Encounter (Signed)
Michael from [facility] states that patient is in a lot of pain and is wanting to know if you could send in something else for him.  She suggested maybe something long acting along with the Hydrocodone for break through if you think that is appropriate.  [facility]

## 2017-08-30 NOTE — Telephone Encounter (Signed)
Please advise have sent prescription for morphine sulfate tablets.

## 2017-08-30 NOTE — Telephone Encounter (Signed)
Try calling [name] back to advise rx was sent. No answer.

## 2017-08-30 NOTE — Telephone Encounter (Signed)
Please advise 

## 2017-08-31 NOTE — Telephone Encounter (Signed)
[facility] was advised.

## 2017-09-05 ENCOUNTER — Other Ambulatory Visit: Payer: Self-pay

## 2017-09-05 NOTE — Telephone Encounter (Signed)
Error

## 2017-09-07 ENCOUNTER — Other Ambulatory Visit

## 2017-09-07 ENCOUNTER — Telehealth: Payer: Self-pay | Admitting: Family Medicine

## 2017-09-07 DIAGNOSIS — C61 Malignant neoplasm of prostate: Secondary | ICD-10-CM

## 2017-09-07 MED ORDER — ONDANSETRON HCL 4 MG PO TABS: 4.0000 mg | ORAL_TABLET | Freq: Three times a day (TID) | ORAL | 0 refills | Status: DC | PRN

## 2017-09-07 NOTE — Telephone Encounter (Signed)
Rx was sent to pharmacy. Patient's wife Parke Simmers was advised.

## 2017-09-07 NOTE — Telephone Encounter (Signed)
Please advise 

## 2017-09-07 NOTE — Telephone Encounter (Signed)
Patient is having bouts of nausea.  Would like to have something prn to give him.  [facility] [address]

## 2017-09-07 NOTE — Telephone Encounter (Signed)
Ondansetron 4mg  q 8hrs prn,#25,no RF

## 2017-09-08 LAB — TESTOSTERONE: Testosterone: <3 ng/dL — ABNORMAL LOW (ref 264–916)

## 2017-09-08 LAB — PSA: Prostate Specific Ag, Serum: 241 ng/mL — ABNORMAL HIGH (ref 0.0–4.0)

## 2017-09-14 ENCOUNTER — Ambulatory Visit: Payer: Medicare Other

## 2017-09-18 ENCOUNTER — Ambulatory Visit (INDEPENDENT_AMBULATORY_CARE_PROVIDER_SITE_OTHER): Admitting: Urology

## 2017-09-18 ENCOUNTER — Encounter: Payer: Self-pay | Admitting: Urology

## 2017-09-18 ENCOUNTER — Other Ambulatory Visit: Payer: Self-pay | Admitting: Family Medicine

## 2017-09-18 VITALS — BP 148/66 | HR 87 | Ht 71.0 in | Wt 158.0 lb

## 2017-09-18 DIAGNOSIS — C61 Malignant neoplasm of prostate: Secondary | ICD-10-CM

## 2017-09-18 NOTE — Telephone Encounter (Signed)
Pt states he will be out of medication tomorrow (has none for tomorrow).  States the Hospice nurse was supposed to call medication in last week.    [facility] [address]

## 2017-09-18 NOTE — Progress Notes (Signed)
09/18/2017 3:41 PM   Michael [facility] Oct 21, 1955 [id number]  Referring provider: Birdie Sons, [address] [place], [place] [address]  Chief Complaint  Patient presents with  . Prostate Cancer    85month    HPI: 62 year old male with metastatic prostate cancer and bilateral ureteral obstruction.  His PSA was greater than 3000.  He had a right ureteral stent placed in the December 2018.  He subsequently developed bilateral ureteral obstruction requiring percutaneous nephrostomy tubes.  His nephrostomy tubes have since been removed.  He is under hospice care.  A PSA in late February 2019 was 1035 which was decreased from greater than 3000. His most recent PSA on 09/07/2017 was 241 with a castrate testosterone level.  He has no voiding complaints.  PMH: Past Medical History:  Diagnosis Date  . Chronic kidney disease   . Depression   . GERD (gastroesophageal reflux disease)   . Hyperlipidemia   . Hypertension   . Myocardial infarction [facility])    s/p stent  . Neuromuscular disorder (HCC)    numbness in arms and legs  . Prostate cancer [facility])     Surgical History: Past Surgical History:  Procedure Laterality Date  . CORONARY ANGIOPLASTY WITH STENT PLACEMENT  12/2011   [facility]  . CYSTOSCOPY W/ RETROGRADES Bilateral 05/24/2017   Procedure: CYSTOSCOPY WITH RETROGRADE PYELOGRAM;  Surgeon: [name], MD;  Location: [facility] ORS;  Service: Urology;  Laterality: Bilateral;  . CYSTOSCOPY WITH STENT PLACEMENT Right 05/24/2017   Procedure: CYSTOSCOPY WITH STENT PLACEMENT;  Surgeon: [name], MD;  Location: [facility] ORS;  Service: Urology;  Laterality: Right;  . IR NEPHROSTOGRAM LEFT THRU EXISTING ACCESS  07/21/2017  . IR NEPHROSTOGRAM LEFT THRU EXISTING ACCESS  08/18/2017  . IR NEPHROSTOGRAM RIGHT THRU EXISTING ACCESS  08/18/2017  . IR NEPHROSTOMY PLACEMENT LEFT  06/15/2017  . IR NEPHROSTOMY PLACEMENT RIGHT  06/14/2017  . IR URETERAL STENT PLACEMENT  EXISTING ACCESS LEFT  07/21/2017  . PICC LINE INSERTION N/A 08/02/2017   Procedure: PICC LINE INSERTION;  Surgeon: Katha Cabal, MD;  Location: [facility];  Service: Cardiovascular;  Laterality: N/A;  . PROSTATE BIOPSY N/A 05/24/2017   Procedure: PROSTATE BIOPSY;  Surgeon: [name], MD;  Location: [facility] ORS;  Service: Urology;  Laterality: N/A;  . TEE WITHOUT CARDIOVERSION N/A 07/31/2017   Procedure: TRANSESOPHAGEAL ECHOCARDIOGRAM (TEE);  Surgeon: Teodoro Spray, MD;  Location: [facility] ORS;  Service: Cardiovascular;  Laterality: N/A;  . US ECHOCARDIOGRAPHY  12/2011   Mild LV dysfunction, EF=45%    Home Medications:  Allergies as of 09/18/2017   No Known Allergies     Medication List        Accurate as of 09/18/17  3:41 PM. Always use your most recent med list.          amLODipine 5 MG tablet Commonly known as:  NORVASC TAKE 1 TABLET BY MOUTH EVERY DAY   buPROPion 150 MG 12 hr tablet Commonly known as:  WELLBUTRIN SR TAKE 1 TABLET BY MOUTH 3 TIMES A DAY**NEEDS OFFICE VISIT**   diphenoxylate-atropine 2.5-0.025 MG tablet Commonly known as:  LOMOTIL Take 1 tablet by mouth 2 (two) times daily as needed for diarrhea or loose stools.   feeding supplement (ENSURE ENLIVE) Liqd Take 237 mLs by mouth 3 (three) times daily between meals.   ferrous sulfate 325 (65 FE) MG EC tablet Take 1 tablet (325 mg total) by mouth 2 (two) times daily.  HYDROcodone-acetaminophen 10-325 MG tablet Commonly known as:  NORCO Take 1 tablet by mouth every 3 (three) hours as needed. For cancer related pain Dx: C61   morphine 15 MG tablet Commonly known as:  MSIR Take 1 tablet (15 mg total) by mouth every 4 (four) hours as needed for severe pain.   ondansetron 4 MG tablet Commonly known as:  ZOFRAN Take 1 tablet (4 mg total) by mouth every 8 (eight) hours as needed for nausea or vomiting.   tamsulosin 0.4 MG Caps capsule Commonly known as:  FLOMAX TAKE 1 CAPSULE BY MOUTH EVERY  DAY       Allergies: No Known Allergies  Family History: Family History  Problem Relation Age of Onset  . Cancer Mother   . Hypertension Mother   . Diabetes Father   . Heart disease Father   . Cancer Paternal Grandfather   . Prostate cancer Neg Hx   . Bladder Cancer Neg Hx   . Kidney cancer Neg Hx     Social History:  reports that he has been smoking cigars.  He has a 48.00 pack-year smoking history. He has never used smokeless tobacco. He reports that he does not drink alcohol or use drugs.  ROS: UROLOGY Frequent Urination?: No Hard to postpone urination?: No Burning/pain with urination?: No Get up at night to urinate?: Yes Leakage of urine?: No Urine stream starts and stops?: No Trouble starting stream?: No Do you have to strain to urinate?: No Blood in urine?: No Urinary tract infection?: No Sexually transmitted disease?: No Injury to kidneys or bladder?: No Painful intercourse?: No Weak stream?: No Erection problems?: No Penile pain?: No  Gastrointestinal Nausea?: Yes Vomiting?: Yes Indigestion/heartburn?: Yes Diarrhea?: No Constipation?: No  Constitutional Fever: Yes Night sweats?: Yes Weight loss?: No Fatigue?: Yes  Skin Skin rash/lesions?: No Itching?: No  Eyes Blurred vision?: No Double vision?: No  Ears/Nose/Throat Sore throat?: No Sinus problems?: No  Hematologic/Lymphatic Swollen glands?: No Easy bruising?: No  Cardiovascular Leg swelling?: Yes Chest pain?: No  Respiratory Cough?: Yes Shortness of breath?: No  Endocrine Excessive thirst?: Yes  Musculoskeletal Back pain?: Yes Joint pain?: Yes  Neurological Headaches?: No Dizziness?: Yes  Psychologic Depression?: Yes Anxiety?: Yes  Physical Exam: BP (!) 148/66   Pulse 87   Ht 5\' 11"  (1.803 m)   Wt 158 lb (71.7 kg)   BMI 22.04 kg/m   Constitutional:  Alert and oriented, No acute distress. HEENT: [place] AT, moist mucus membranes.  Trachea midline, no  masses. Cardiovascular: No clubbing, cyanosis, or edema. Respiratory: Normal respiratory effort, no increased work of breathing. GI: Abdomen is soft, nontender, nondistended, no abdominal masses GU: No CVA tenderness Skin: No rashes, bruises or suspicious lesions. Neurologic: Grossly intact, no focal deficits, moving all 4 extremities. Psychiatric: Normal mood and affect.  Laboratory Data: Lab Results  Component Value Date   WBC 8.6 07/31/2017   HGB 8.6 (L) 07/31/2017   HCT 24.6 (L) 07/31/2017   MCV 89.5 07/31/2017   PLT 262 07/31/2017    Lab Results  Component Value Date   CREATININE 2.05 (H) 07/31/2017    Lab Results  Component Value Date   PSA 2,338.8 (H) 04/24/2017    Lab Results  Component Value Date   TESTOSTERONE <3 (L) 09/07/2017    Lab Results  Component Value Date   HGBA1C 5.8 (H) 11/15/2016     Assessment & Plan:   Metastatic prostate cancer on hormonal therapy.  He has not yet reached his PSA  nadir.  He has bilateral ureteral stents and will need to schedule cystoscopy with retrograde pyelograms and either stent removal versus exchange.  A KUB was ordered to assess for stent encrustation.  He is due for Lupron however states he needs to check with hospice before this can be given.   Return in about 3 months (around 12/18/2017) for Recheck.  [name], [facility] [address], [place], [place] [address] [phone]

## 2017-09-19 MED ORDER — HYDROCODONE-ACETAMINOPHEN 10-325 MG PO TABS: 1.0000 | ORAL_TABLET | ORAL | 0 refills | Status: DC | PRN

## 2017-09-28 ENCOUNTER — Telehealth: Payer: Self-pay | Admitting: Family Medicine

## 2017-09-28 MED ORDER — HYDROCODONE-ACETAMINOPHEN 10-325 MG PO TABS: 1.0000 | ORAL_TABLET | ORAL | 0 refills | Status: DC | PRN

## 2017-09-28 NOTE — Telephone Encounter (Signed)
[name] with Hospice called saying that the patient is on hydrocodone 10 mg 4 times a day.  She said he was concerned because he only was getting 100 tablets per prescription.  He should be getting 120.  [facility] [address]  thanks [employer]

## 2017-09-28 NOTE — Telephone Encounter (Signed)
Please advise 

## 2017-10-02 ENCOUNTER — Other Ambulatory Visit: Payer: Self-pay | Admitting: Radiology

## 2017-10-02 ENCOUNTER — Ambulatory Visit (INDEPENDENT_AMBULATORY_CARE_PROVIDER_SITE_OTHER)

## 2017-10-02 ENCOUNTER — Ambulatory Visit
Admission: RE | Admit: 2017-10-02 | Discharge: 2017-10-02 | Disposition: A | Discharge status: Home / Self Care | Source: Ambulatory Visit | Attending: Urology | Admitting: Urology

## 2017-10-02 ENCOUNTER — Encounter: Payer: Self-pay | Admitting: Urology

## 2017-10-02 DIAGNOSIS — C61 Malignant neoplasm of prostate: Secondary | ICD-10-CM

## 2017-10-02 DIAGNOSIS — C7951 Secondary malignant neoplasm of bone: Secondary | ICD-10-CM | POA: Diagnosis not present

## 2017-10-02 MED ORDER — LEUPROLIDE ACETATE (3 MONTH) 22.5 MG IM KIT
22.5000 mg | PACK | Freq: Once | INTRAMUSCULAR | Status: AC
  Administered 2017-10-02: 22.5 mg via INTRAMUSCULAR

## 2017-10-02 NOTE — Progress Notes (Signed)
Lupron IM Injection   Due to Prostate Cancer patient is present today for a Lupron Injection.  Medication: Lupron 3 month Dose: 22.5 mg  Location: right upper outer buttocks Lot: [id number] Exp: 02/19/2020  Patient tolerated well, no complications were noted  Performed by: Gordy Clement, CMA  Follow up: As scheduled w/ provider in July

## 2017-10-05 ENCOUNTER — Encounter: Payer: Self-pay | Admitting: Urology

## 2017-10-11 ENCOUNTER — Other Ambulatory Visit: Payer: Self-pay | Admitting: Radiology

## 2017-10-11 DIAGNOSIS — N1330 Unspecified hydronephrosis: Secondary | ICD-10-CM

## 2017-10-11 DIAGNOSIS — C61 Malignant neoplasm of prostate: Secondary | ICD-10-CM

## 2017-10-13 ENCOUNTER — Encounter
Admission: RE | Admit: 2017-10-13 | Discharge: 2017-10-13 | Disposition: A | Discharge status: Home / Self Care | Source: Ambulatory Visit | Attending: Urology | Admitting: Urology

## 2017-10-13 ENCOUNTER — Other Ambulatory Visit: Payer: Self-pay

## 2017-10-13 NOTE — Patient Instructions (Signed)
Your procedure is scheduled HE:NIDPOE 5/24 Report to Day Surgery. To find out your arrival time please call [phone] between 1PM - 3PM on Thurs. 5/23  Remember: Instructions that are not followed completely may result in serious medical risk, up to and including death, or upon the discretion of your surgeon and anesthesiologist your surgery may need to be rescheduled.     _X__ 1. Do not eat food after midnight the night before your procedure.                 No gum chewing or hard candies. You may drink clear liquids up to 2 hours                 before you are scheduled to arrive for your surgery- DO not drink clear                 liquids within 2 hours of the start of your surgery.                 Clear Liquids include:  water, apple juice without pulp, clear carbohydrate                 drink such as Clearfast of Gartorade, Black Coffee or Tea (Do not add                 anything to coffee or tea).  __X__2.  On the morning of surgery brush your teeth with toothpaste and water, you may rinse your mouth with mouthwash if you wish.  Do not swallow any  toothpaste of mouthwash.     _X__ 3.  No Alcohol for 24 hours before or after surgery.   _X__ 4.  Do Not Smoke or use e-cigarettes For 24 Hours Prior to Your Surgery.                 Do not use any chewable tobacco products for at least 6 hours prior to                 surgery.  ____  5.  Bring all medications with you on the day of surgery if instructed.   ___x_  6.  Notify your doctor if there is any change in your medical condition      (cold, fever, infections).     Do not wear jewelry, make-up, hairpins, clips or nail polish. Do not wear lotions, powders, or perfumes. You may wear deodorant. Do not shave 48 hours prior to surgery. Men may shave face and neck. Do not bring valuables to the hospital.    [facility] is not responsible for any belongings or valuables.  Contacts, dentures or bridgework  may not be worn into surgery. Leave your suitcase in the car. After surgery it may be brought to your room. For patients admitted to the hospital, discharge time is determined by your treatment team.   Patients discharged the day of surgery will not be allowed to drive home.   Please read over the following fact sheets that you were given:    __x__ Take these medicines the morning of surgery with A SIP OF WATER:    1. diphenoxylate-atropine (LOMOTIL) 2.5-0.025 MG tablet  2. HYDROcodone-acetaminophen (NORCO) 10-325 MG tablet  3.   4.  5.  6.  ____ Fleet Enema (as directed)   ____ Use CHG Soap as directed  ____ Use inhalers on the day of surgery  ____ Stop metformin 2 days prior to  surgery    ____ Take 1/2 of usual insulin dose the night before surgery. No insulin the morning          of surgery.   ____ Stop Coumadin/Plavix/aspirin on   ____ Stop Anti-inflammatories on    ____ Stop supplements until after surgery.    ____ Bring C-Pap to the hospital.

## 2017-10-17 ENCOUNTER — Encounter
Admission: RE | Admit: 2017-10-17 | Discharge: 2017-10-17 | Disposition: A | Discharge status: Home / Self Care | Source: Ambulatory Visit | Attending: Urology | Admitting: Urology

## 2017-10-17 DIAGNOSIS — C61 Malignant neoplasm of prostate: Secondary | ICD-10-CM | POA: Diagnosis not present

## 2017-10-17 DIAGNOSIS — N1330 Unspecified hydronephrosis: Secondary | ICD-10-CM | POA: Insufficient documentation

## 2017-10-19 ENCOUNTER — Other Ambulatory Visit: Payer: Self-pay | Admitting: Radiology

## 2017-10-20 LAB — URINE CULTURE

## 2017-10-24 ENCOUNTER — Other Ambulatory Visit: Payer: Self-pay | Admitting: Radiology

## 2017-10-24 ENCOUNTER — Telehealth: Payer: Self-pay | Admitting: Radiology

## 2017-10-24 DIAGNOSIS — C61 Malignant neoplasm of prostate: Secondary | ICD-10-CM

## 2017-10-24 DIAGNOSIS — N1330 Unspecified hydronephrosis: Secondary | ICD-10-CM

## 2017-10-24 MED ORDER — SULFAMETHOXAZOLE-TRIMETHOPRIM 800-160 MG PO TABS: 1.0000 | ORAL_TABLET | Freq: Two times a day (BID) | ORAL | 0 refills | Status: DC

## 2017-10-24 NOTE — Telephone Encounter (Signed)
Wife notified of positive urine culture & script sent to pharmacy for Septra DS 1 p.o. twice daily x7 days per Dr Bernardo Heater. Wife voices understanding.

## 2017-10-27 ENCOUNTER — Other Ambulatory Visit: Payer: Self-pay | Admitting: Family Medicine

## 2017-10-27 MED ORDER — HYDROCODONE-ACETAMINOPHEN 10-325 MG PO TABS: 1.0000 | ORAL_TABLET | ORAL | 0 refills | Status: DC | PRN

## 2017-10-27 NOTE — Telephone Encounter (Signed)
Pt's wife Parke Simmers contacted office for refill request on the following medications:  HYDROcodone-acetaminophen ([place]) 10-325 MG tablet  [facility] [address]  Last Rx: 09/28/17 LOV: 06/19/17 Please advise. Thanks TNP

## 2017-10-30 ENCOUNTER — Telehealth: Payer: Self-pay | Admitting: Family Medicine

## 2017-10-30 NOTE — Telephone Encounter (Signed)
Please advise 

## 2017-10-30 NOTE — Telephone Encounter (Signed)
Devana with Hospice called saying patient wants to discontinue services with Hospice.  Nurse is just concerned and would like Dr. Caryn Section to reach out to patient to continue services.  Pt thinks he doesn't need services because he is feeling better.  Pt's call number is [phone]  Thanks Con Memos

## 2017-11-01 ENCOUNTER — Other Ambulatory Visit: Payer: Self-pay | Admitting: Radiology

## 2017-11-02 ENCOUNTER — Telehealth: Payer: Self-pay | Admitting: Radiology

## 2017-11-02 ENCOUNTER — Other Ambulatory Visit: Payer: Self-pay | Admitting: Radiology

## 2017-11-02 DIAGNOSIS — N1330 Unspecified hydronephrosis: Secondary | ICD-10-CM

## 2017-11-02 DIAGNOSIS — C61 Malignant neoplasm of prostate: Secondary | ICD-10-CM

## 2017-11-02 NOTE — Telephone Encounter (Addendum)
Wife states pt is unavailable for surgery scheduled with Dr Bernardo Heater tomorrow because they were given a tentative date of 11/03/2017 but it was not confirmed.  Surgery rescheduled at Dr Dene Gentry first available surgery date that was convenient for patient as well which is 11/21/2017. Per Caryl Pina in pre-admission testing, explained to wife that a repeated pre-admission testing appointment is not necessary but to follow instructions given when he was seen there on 10/13/2017. Questions answered. Wife voices understanding.

## 2017-11-13 ENCOUNTER — Other Ambulatory Visit

## 2017-11-14 ENCOUNTER — Other Ambulatory Visit: Payer: Medicare Other

## 2017-11-14 ENCOUNTER — Other Ambulatory Visit: Payer: Self-pay

## 2017-11-14 DIAGNOSIS — N1330 Unspecified hydronephrosis: Secondary | ICD-10-CM

## 2017-11-14 LAB — URINALYSIS, COMPLETE
Bilirubin, UA: NEGATIVE
GLUCOSE, UA: NEGATIVE
Ketones, UA: NEGATIVE
Nitrite, UA: NEGATIVE
Specific Gravity, UA: 1.015 (ref 1.005–1.030)
Urobilinogen, Ur: 0.2 mg/dL (ref 0.2–1.0)
pH, UA: 5 (ref 5.0–7.5)

## 2017-11-14 LAB — MICROSCOPIC EXAMINATION

## 2017-11-16 ENCOUNTER — Other Ambulatory Visit: Payer: Self-pay | Admitting: Family Medicine

## 2017-11-16 MED ORDER — HYDROCODONE-ACETAMINOPHEN 10-325 MG PO TABS: 1.0000 | ORAL_TABLET | ORAL | 0 refills | Status: DC | PRN

## 2017-11-16 NOTE — Telephone Encounter (Signed)
Davannea with Hospice is requesting refill for the following medication:  HYDROcodone-acetaminophen ([place]) 10-325 MG tablet  [facility]  Last Rx: 10/27/17 Please advise. Thanks TNP

## 2017-11-17 ENCOUNTER — Encounter (INDEPENDENT_AMBULATORY_CARE_PROVIDER_SITE_OTHER): Payer: Medicare Other | Admitting: Family Medicine

## 2017-11-17 DIAGNOSIS — C7951 Secondary malignant neoplasm of bone: Secondary | ICD-10-CM

## 2017-11-17 DIAGNOSIS — C61 Malignant neoplasm of prostate: Secondary | ICD-10-CM | POA: Diagnosis not present

## 2017-11-17 DIAGNOSIS — C785 Secondary malignant neoplasm of large intestine and rectum: Secondary | ICD-10-CM

## 2017-11-17 DIAGNOSIS — C7800 Secondary malignant neoplasm of unspecified lung: Secondary | ICD-10-CM

## 2017-11-18 LAB — CULTURE, URINE COMPREHENSIVE

## 2017-11-20 ENCOUNTER — Other Ambulatory Visit: Payer: Self-pay | Admitting: Family Medicine

## 2017-11-20 ENCOUNTER — Telehealth: Payer: Self-pay

## 2017-11-20 DIAGNOSIS — K529 Noninfective gastroenteritis and colitis, unspecified: Secondary | ICD-10-CM

## 2017-11-20 MED ORDER — LEVOFLOXACIN 500 MG PO TABS: 500.0000 mg | ORAL_TABLET | Freq: Every day | ORAL | 0 refills | Status: DC

## 2017-11-20 NOTE — Telephone Encounter (Signed)
-----   Message from [name], MD sent at 11/20/2017  4:40 PM EDT ----- Send rx levaquin 500 mg daily for 5 days.  Have him check first dose tonight and he should be good for surgery in am

## 2017-11-20 NOTE — Telephone Encounter (Signed)
Patient's wife notified and script sent

## 2017-11-21 ENCOUNTER — Ambulatory Visit: Admitting: Certified Registered"

## 2017-11-21 ENCOUNTER — Ambulatory Visit
Admission: RE | Admit: 2017-11-21 | Discharge: 2017-11-21 | Disposition: A | Discharge status: Home / Self Care | Source: Ambulatory Visit | Attending: Urology | Admitting: Urology

## 2017-11-21 ENCOUNTER — Encounter
Admission: RE | Disposition: A | Discharge status: Home / Self Care | Payer: Self-pay | Source: Ambulatory Visit | Attending: Urology

## 2017-11-21 DIAGNOSIS — Z955 Presence of coronary angioplasty implant and graft: Secondary | ICD-10-CM | POA: Diagnosis not present

## 2017-11-21 DIAGNOSIS — C61 Malignant neoplasm of prostate: Secondary | ICD-10-CM | POA: Diagnosis not present

## 2017-11-21 DIAGNOSIS — N1330 Unspecified hydronephrosis: Secondary | ICD-10-CM

## 2017-11-21 DIAGNOSIS — C7951 Secondary malignant neoplasm of bone: Secondary | ICD-10-CM

## 2017-11-21 DIAGNOSIS — K219 Gastro-esophageal reflux disease without esophagitis: Secondary | ICD-10-CM | POA: Diagnosis not present

## 2017-11-21 DIAGNOSIS — N135 Crossing vessel and stricture of ureter without hydronephrosis: Secondary | ICD-10-CM | POA: Diagnosis present

## 2017-11-21 DIAGNOSIS — E785 Hyperlipidemia, unspecified: Secondary | ICD-10-CM | POA: Insufficient documentation

## 2017-11-21 DIAGNOSIS — F419 Anxiety disorder, unspecified: Secondary | ICD-10-CM | POA: Insufficient documentation

## 2017-11-21 DIAGNOSIS — J449 Chronic obstructive pulmonary disease, unspecified: Secondary | ICD-10-CM | POA: Insufficient documentation

## 2017-11-21 DIAGNOSIS — I252 Old myocardial infarction: Secondary | ICD-10-CM | POA: Diagnosis not present

## 2017-11-21 DIAGNOSIS — N131 Hydronephrosis with ureteral stricture, not elsewhere classified: Secondary | ICD-10-CM | POA: Diagnosis not present

## 2017-11-21 DIAGNOSIS — I2510 Atherosclerotic heart disease of native coronary artery without angina pectoris: Secondary | ICD-10-CM | POA: Insufficient documentation

## 2017-11-21 DIAGNOSIS — N189 Chronic kidney disease, unspecified: Secondary | ICD-10-CM | POA: Insufficient documentation

## 2017-11-21 DIAGNOSIS — F17290 Nicotine dependence, other tobacco product, uncomplicated: Secondary | ICD-10-CM | POA: Diagnosis not present

## 2017-11-21 DIAGNOSIS — M1990 Unspecified osteoarthritis, unspecified site: Secondary | ICD-10-CM | POA: Insufficient documentation

## 2017-11-21 DIAGNOSIS — I129 Hypertensive chronic kidney disease with stage 1 through stage 4 chronic kidney disease, or unspecified chronic kidney disease: Secondary | ICD-10-CM | POA: Insufficient documentation

## 2017-11-21 HISTORY — PX: CYSTOSCOPY W/ RETROGRADES: SHX1426

## 2017-11-21 HISTORY — PX: CYSTOSCOPY W/ URETERAL STENT REMOVAL: SHX1430

## 2017-11-21 HISTORY — PX: CYSTOSCOPY W/ URETERAL STENT PLACEMENT: SHX1429

## 2017-11-21 LAB — HEMOGLOBIN: Hemoglobin: 10.8 g/dL — ABNORMAL LOW (ref 13.0–18.0)

## 2017-11-21 SURGERY — REMOVAL, STENT, URETER, CYSTOSCOPIC
Anesthesia: General | Laterality: Bilateral | Wound class: Clean Contaminated

## 2017-11-21 MED ORDER — ONDANSETRON HCL 4 MG/2ML IJ SOLN
INTRAMUSCULAR | Status: DC | PRN
  Administered 2017-11-21: 4 mg via INTRAVENOUS

## 2017-11-21 MED ORDER — PROPOFOL 10 MG/ML IV BOLUS
INTRAVENOUS | Status: AC
  Filled 2017-11-21: qty 20

## 2017-11-21 MED ORDER — ONDANSETRON HCL 4 MG/2ML IJ SOLN: 4.0000 mg | Freq: Once | INTRAMUSCULAR | Status: DC | PRN

## 2017-11-21 MED ORDER — FENTANYL CITRATE (PF) 100 MCG/2ML IJ SOLN: 25.0000 ug | INTRAMUSCULAR | Status: DC | PRN

## 2017-11-21 MED ORDER — PROPOFOL 10 MG/ML IV BOLUS
INTRAVENOUS | Status: DC | PRN
  Administered 2017-11-21: 120 mg via INTRAVENOUS

## 2017-11-21 MED ORDER — FAMOTIDINE 20 MG PO TABS
20.0000 mg | ORAL_TABLET | Freq: Once | ORAL | Status: AC
  Administered 2017-11-21: 20 mg via ORAL

## 2017-11-21 MED ORDER — LIDOCAINE HCL (PF) 2 % IJ SOLN
INTRAMUSCULAR | Status: AC
  Filled 2017-11-21: qty 10

## 2017-11-21 MED ORDER — CEFAZOLIN SODIUM-DEXTROSE 2-4 GM/100ML-% IV SOLN
2.0000 g | INTRAVENOUS | Status: AC
  Administered 2017-11-21: 2 g via INTRAVENOUS

## 2017-11-21 MED ORDER — CEFAZOLIN SODIUM-DEXTROSE 2-4 GM/100ML-% IV SOLN
INTRAVENOUS | Status: AC
  Filled 2017-11-21: qty 100

## 2017-11-21 MED ORDER — PHENYLEPHRINE HCL 10 MG/ML IJ SOLN
INTRAMUSCULAR | Status: DC | PRN
  Administered 2017-11-21: 200 ug via INTRAVENOUS
  Administered 2017-11-21: 100 ug via INTRAVENOUS

## 2017-11-21 MED ORDER — IOTHALAMATE MEGLUMINE 43 % IV SOLN
INTRAVENOUS | Status: DC | PRN
  Administered 2017-11-21: 45 mL via SURGICAL_CAVITY

## 2017-11-21 MED ORDER — LACTATED RINGERS IV SOLN
INTRAVENOUS | Status: DC
  Administered 2017-11-21: 50 mL/h via INTRAVENOUS

## 2017-11-21 MED ORDER — ONDANSETRON HCL 4 MG/2ML IJ SOLN
INTRAMUSCULAR | Status: AC
  Filled 2017-11-21: qty 2

## 2017-11-21 MED ORDER — FAMOTIDINE 20 MG PO TABS
ORAL_TABLET | ORAL | Status: AC
  Filled 2017-11-21: qty 1

## 2017-11-21 MED ORDER — FENTANYL CITRATE (PF) 100 MCG/2ML IJ SOLN
INTRAMUSCULAR | Status: AC
  Filled 2017-11-21: qty 2

## 2017-11-21 MED ORDER — DEXAMETHASONE SODIUM PHOSPHATE 10 MG/ML IJ SOLN
INTRAMUSCULAR | Status: DC | PRN
  Administered 2017-11-21: 5 mg via INTRAVENOUS

## 2017-11-21 MED ORDER — LIDOCAINE HCL (CARDIAC) PF 100 MG/5ML IV SOSY
PREFILLED_SYRINGE | INTRAVENOUS | Status: DC | PRN
  Administered 2017-11-21: 60 mg via INTRAVENOUS

## 2017-11-21 MED ORDER — DEXAMETHASONE SODIUM PHOSPHATE 10 MG/ML IJ SOLN
INTRAMUSCULAR | Status: AC
  Filled 2017-11-21: qty 1

## 2017-11-21 MED ORDER — FENTANYL CITRATE (PF) 100 MCG/2ML IJ SOLN
INTRAMUSCULAR | Status: DC | PRN
  Administered 2017-11-21 (×4): 50 ug via INTRAVENOUS

## 2017-11-21 SURGICAL SUPPLY — 21 items
BAG DRAIN CYSTO-URO LG1000N (MISCELLANEOUS) ×3 IMPLANT
BRUSH SCRUB EZ  4% CHG (MISCELLANEOUS) ×2
BRUSH SCRUB EZ 4% CHG (MISCELLANEOUS) ×1 IMPLANT
CATH URETL 5X70 OPEN END (CATHETERS) ×3 IMPLANT
CONRAY 43 FOR UROLOGY 50M (MISCELLANEOUS) ×3 IMPLANT
DRAPE UTILITY 15X26 TOWEL STRL (DRAPES) ×3 IMPLANT
GLOVE BIO SURGEON STRL SZ8 (GLOVE) ×3 IMPLANT
GOWN STRL REUS W/ TWL LRG LVL3 (GOWN DISPOSABLE) ×2 IMPLANT
GOWN STRL REUS W/ TWL XL LVL3 (GOWN DISPOSABLE) ×1 IMPLANT
GOWN STRL REUS W/TWL LRG LVL3 (GOWN DISPOSABLE) ×4
GOWN STRL REUS W/TWL XL LVL3 (GOWN DISPOSABLE) ×2
KIT TURNOVER CYSTO (KITS) ×3 IMPLANT
PACK CYSTO AR (MISCELLANEOUS) ×3 IMPLANT
SENSORWIRE 0.038 NOT ANGLED (WIRE) ×6
SET CYSTO W/LG BORE CLAMP LF (SET/KITS/TRAYS/PACK) ×3 IMPLANT
SOL .9 NS 3000ML IRR  AL (IV SOLUTION) ×2
SOL .9 NS 3000ML IRR UROMATIC (IV SOLUTION) ×1 IMPLANT
STENT URET 6FRX24 CONTOUR (STENTS) ×6 IMPLANT
SURGILUBE 2OZ TUBE FLIPTOP (MISCELLANEOUS) ×3 IMPLANT
WATER STERILE IRR 1000ML POUR (IV SOLUTION) ×3 IMPLANT
WIRE SENSOR 0.038 NOT ANGLED (WIRE) ×2 IMPLANT

## 2017-11-21 NOTE — Anesthesia Preprocedure Evaluation (Signed)
Anesthesia Evaluation  Patient identified by MRN, date of birth, ID band Patient awake    Reviewed: Allergy & Precautions, NPO status , Patient's Chart, lab work & pertinent test results, reviewed documented beta blocker date and time   Airway Mallampati: II  TM Distance: >3 FB     Dental  (+) Chipped, Upper Dentures, Lower Dentures   Pulmonary COPD, Current Smoker,    Pulmonary exam normal        Cardiovascular hypertension, Pt. on medications + CAD and + Past MI  Normal cardiovascular exam     Neuro/Psych PSYCHIATRIC DISORDERS Depression  Neuromuscular disease    GI/Hepatic GERD  Controlled,  Endo/Other    Renal/GU Renal disease     Musculoskeletal  (+) Arthritis ,   Abdominal Normal abdominal exam  (+)   Peds  Hematology   Anesthesia Other Findings EKG reviewed and OK.  Reproductive/Obstetrics                             Anesthesia Physical  Anesthesia Plan  ASA: III  Anesthesia Plan: General   Post-op Pain Management:    Induction: Intravenous  PONV Risk Score and Plan:   Airway Management Planned: LMA  Additional Equipment:   Intra-op Plan:   Post-operative Plan:   Informed Consent: I have reviewed the patients History and Physical, chart, labs and discussed the procedure including the risks, benefits and alternatives for the proposed anesthesia with the patient or authorized representative who has indicated his/her understanding and acceptance.     Plan Discussed with: CRNA  Anesthesia Plan Comments:         Anesthesia Quick Evaluation

## 2017-11-21 NOTE — Anesthesia Postprocedure Evaluation (Signed)
Anesthesia Post Note  Patient: Michael [facility]  Procedure(s) Performed: CYSTOSCOPY WITH STENT REMOVAL (Bilateral ) CYSTOSCOPY WITH RETROGRADE PYELOGRAM (Bilateral ) CYSTOSCOPY WITH STENT REPLACEMENT (Bilateral )  Patient location during evaluation: PACU Anesthesia Type: General Level of consciousness: awake and alert and oriented Pain management: pain level controlled Vital Signs Assessment: post-procedure vital signs reviewed and stable Respiratory status: spontaneous breathing Cardiovascular status: blood pressure returned to baseline Anesthetic complications: no     Last Vitals:  Vitals:   11/21/17 1015 11/21/17 1024  BP: (!) 149/71 (!) 154/55  Pulse: 77 83  Resp: 14 14  Temp: 36.6 C (!) 36.4 C  SpO2: 100% 100%    Last Pain:  Vitals:   11/21/17 1024  TempSrc: Temporal  PainSc: 0-No pain                 Orlinda Slomski

## 2017-11-21 NOTE — Op Note (Signed)
Preoperative diagnosis:  1. Bilateral ureteral obstruction secondary to metastatic prostate cancer  Postoperative diagnosis:  1. Bilateral ureteral obstruction secondary to metastatic prostate cancer  Procedure: 1. Cystoscopy with bilateral stent removal 2. Bilateral retrograde pyelography with interpretation 3. Exchange of ureteral stent-bilateral  Surgeon: [name], MD  Anesthesia: General  Complications: None  Intraoperative findings:  Retrograde pyelogram on the right showed a normal caliber ureter with drainage however there was moderate right hydronephrosis and retention of contrast persistent after approximately 10 minutes  Left retrograde pyelogram with mild proximal ureteral dilation and moderate hydronephrosis.  Prompt emptying was noted from the mid and distal ureter however contrast was retained in the proximal ureter and collecting system.  EBL: Minimal  Specimens: None  Indication: Michael Small is a 62 y.o. patient with metastatic prostate cancer and bilateral ureteral obstruction.  His PSA was greater than 3000 on initial diagnosis.  He is on hormonal therapy and last PSA April 2019 was 241.  He had a right ureteral stent placed in December 2018.  He underwent placement of percutaneous nephrostomy tubes for worsening obstruction.  His left nephrostomy was converted to an antegrade stent and his nephrostomy tubes were subsequently removed.  He presents today for cystoscopy with bilateral stent removal, retrograde pyelograms and possible stent exchange.  After reviewing the management options for treatment, he elected to proceed with the above surgical procedure(s). We have discussed the potential benefits and risks of the procedure, side effects of the proposed treatment, the likelihood of the patient achieving the goals of the procedure, and any potential problems that might occur during the procedure or recuperation. Informed consent has been  obtained.  Description of procedure:  The patient was taken to the operating room and general anesthesia was induced.  The patient was placed in the dorsal lithotomy position, prepped and draped in the usual sterile fashion, and preoperative antibiotics were administered. A preoperative time-out was performed.   The urethral meatus would not accept a 22 French cystoscope and was dilated with [employer] sounds from 16-26 [place].  The cystoscope was then easily inserted per urethra.  The urethra was normal in caliber without stricture.  Prostate was nonocclusive without abnormal appearing tissue.  Panendoscopy was performed and there was bullous edema around the ureteral orifice ease bilaterally secondary to the indwelling stents.  No other mucosal abnormalities, solid or papillary lesions were noted.    The right ureteral stent was grasped with endoscopic forceps and brought out to the urethral meatus however a 0.038 Sensor wire would not advance through the most proximal portion of the stent.  The cystoscope was repassed and the sensor wire was placed alongside the indwelling stent without difficulty and advanced to the renal pelvis.  The double-J ureteral stent was removed.  A 6 French open-ended ureteral catheter was placed over the guidewire and the guidewire was removed.  Pullout retrograde pyelograms performed with findings as described above.  With persistent retention of contrast it was elected to exchange the right ureteral stent and a 6 French/24 cm double-J ureteral stent was placed under fluoroscopic guidance without difficulty.  There was good positioning noted proximally and distally.  Attention was directed to the left ureteral orifice where the indwelling stent was grasped with endoscopic forceps and brought to the urethral meatus.  The guidewire was advanced through the stent to the renal pelvis.  The ureteral stent was removed.  A 6 French open-ended ureteral catheter was placed over the  guidewire and pullout retrograde  pyelogram scan performed with findings as described above.  Due to persistent retention of contrast it was elected to replace the stent on this side and a 6 French/24 cm double-J ureteral stent was placed on fluoroscopic guidance without difficulty.  Good positioning was noted proximally and distally.  The bladder was emptied and all instrument were removed.  After anesthetic reversal he was transported to PACU in stable condition.  [name], M.D.

## 2017-11-21 NOTE — Anesthesia Post-op Follow-up Note (Signed)
Anesthesia QCDR form completed.        

## 2017-11-21 NOTE — H&P (Signed)
11/21/2017 8:33 AM   Michael Small [birth year] [id number]  Referring provider: No referring provider defined for this encounter.  HPI: 62 year old male with metastatic prostate cancer and bilateral ureteral obstruction.  His PSA was greater than 3000 on initial diagnosis.  He is on hormonal therapy and last PSA April 2019 was 241.  He had a right ureteral stent placed in December 2018.  He underwent placement of percutaneous nephrostomy tubes for worsening obstruction.  His left nephrostomy was converted to an antegrade stent and his nephrostomy tubes were subsequently removed.  He presents today for cystoscopy with bilateral stent removal, retrograde pyelograms and possible stent exchange.  He has no complaints today.  Preoperative urine culture grew 10-25,000 colonies of Klebsiella felt to represent colonization.  He started Levaquin yesterday.   PMH: Past Medical History:  Diagnosis Date  . Chronic kidney disease   . Depression   . GERD (gastroesophageal reflux disease)   . Hyperlipidemia   . Hypertension   . Myocardial infarction [facility])    s/p stent  . Neuromuscular disorder (HCC)    numbness in arms and legs  . Prostate cancer [facility])     Surgical History: Past Surgical History:  Procedure Laterality Date  . CORONARY ANGIOPLASTY WITH STENT PLACEMENT  12/2011   [facility] Cardiology  . CYSTOSCOPY W/ RETROGRADES Bilateral 05/24/2017   Procedure: CYSTOSCOPY WITH RETROGRADE PYELOGRAM;  Surgeon: [name], MD;  Location: [facility] ORS;  Service: Urology;  Laterality: Bilateral;  . CYSTOSCOPY WITH STENT PLACEMENT Right 05/24/2017   Procedure: CYSTOSCOPY WITH STENT PLACEMENT;  Surgeon: [name], MD;  Location: [facility] ORS;  Service: Urology;  Laterality: Right;  . IR NEPHROSTOGRAM LEFT THRU EXISTING ACCESS  07/21/2017  . IR NEPHROSTOGRAM LEFT THRU EXISTING ACCESS  08/18/2017  . IR NEPHROSTOGRAM RIGHT THRU EXISTING ACCESS  08/18/2017  . IR NEPHROSTOMY PLACEMENT LEFT  06/15/2017    . IR NEPHROSTOMY PLACEMENT RIGHT  06/14/2017  . IR URETERAL STENT PLACEMENT EXISTING ACCESS LEFT  07/21/2017  . PICC LINE INSERTION N/A 08/02/2017   Procedure: PICC LINE INSERTION;  Surgeon: Katha Cabal, MD;  Location: [place] CV LAB;  Service: Cardiovascular;  Laterality: N/A;  . PROSTATE BIOPSY N/A 05/24/2017   Procedure: PROSTATE BIOPSY;  Surgeon: [name], MD;  Location: [facility] ORS;  Service: Urology;  Laterality: N/A;  . TEE WITHOUT CARDIOVERSION N/A 07/31/2017   Procedure: TRANSESOPHAGEAL ECHOCARDIOGRAM (TEE);  Surgeon: Teodoro Spray, MD;  Location: [facility] ORS;  Service: Cardiovascular;  Laterality: N/A;  . US ECHOCARDIOGRAPHY  12/2011   Mild LV dysfunction, EF=45%    Home Medications:  Reviewed  Allergies: No Known Allergies  Family History: Family History  Problem Relation Age of Onset  . Cancer Mother   . Hypertension Mother   . Diabetes Father   . Heart disease Father   . Cancer Paternal Grandfather   . Prostate cancer Neg Hx   . Bladder Cancer Neg Hx   . Kidney cancer Neg Hx     Social History:  reports that he has been smoking cigars.  He has a 48.00 pack-year smoking history. He has never used smokeless tobacco. He reports that he does not drink alcohol or use drugs.  ROS: Otherwise noncontributory  Physical Exam: BP (!) 158/73   Pulse 88   Temp (!) 97.2 F (36.2 C) (Temporal)   Resp 18   Wt 146 lb 4 oz (66.3 kg)   SpO2 100%   BMI 20.98 kg/m  Constitutional:  Alert and oriented, No acute distress. HEENT: [place] AT, moist mucus membranes.  Trachea midline, no masses. Cardiovascular: No clubbing, cyanosis, or edema.  RRR Respiratory: Normal respiratory effort, no increased work of breathing.  Clear GI: Abdomen is soft, nontender, nondistended, no abdominal masses GU: No CVA tenderness Lymph: No cervical or inguinal lymphadenopathy. Skin: No rashes, bruises or suspicious lesions. Neurologic: Grossly intact, no focal deficits, moving all 4  extremities. Psychiatric: Normal mood and affect.   Assessment & Plan:   62 year old male with metastatic prostate cancer and bilateral ureteral obstruction for cystoscopy with bilateral stent removal, retrograde pyelograms and possible stent exchange.  The procedure has been discussed in detail including potential risks of bleeding, infection/sepsis.  He indicated all questions were answered and desires to proceed.    [name], [place] [address], [place], [place] [address] [phone]

## 2017-11-21 NOTE — Interval H&P Note (Signed)
History and Physical Interval Note:  11/21/2017 8:39 AM  Michael Small  has presented today for surgery, with the diagnosis of bilateral hydronephrosis, prostate cancer  The various methods of treatment have been discussed with the patient and family. After consideration of risks, benefits and other options for treatment, the patient has consented to  Procedure(s): CYSTOSCOPY WITH STENT REMOVAL (Bilateral) CYSTOSCOPY WITH RETROGRADE PYELOGRAM (Bilateral) CYSTOSCOPY WITH STENT REPLACEMENT (Bilateral) as a surgical intervention .  The patient's history has been reviewed, patient examined, no change in status, stable for surgery.  I have reviewed the patient's chart and labs.  Questions were answered to the patient's satisfaction.     [place]

## 2017-11-21 NOTE — Anesthesia Procedure Notes (Signed)
Procedure Name: LMA Insertion Date/Time: 11/21/2017 9:02 AM Performed by: Chanetta Marshall, CRNA Pre-anesthesia Checklist: Patient identified Patient Re-evaluated:Patient Re-evaluated prior to induction Oxygen Delivery Method: Circle system utilized Preoxygenation: Pre-oxygenation with 100% oxygen Induction Type: IV induction LMA: LMA inserted LMA Size: 4.0 Number of attempts: 1 Dental Injury: Teeth and Oropharynx as per pre-operative assessment

## 2017-11-21 NOTE — Transfer of Care (Signed)
Immediate Anesthesia Transfer of Care Note  Patient: Michael Small  Procedure(s) Performed: CYSTOSCOPY WITH STENT REMOVAL (Bilateral ) CYSTOSCOPY WITH RETROGRADE PYELOGRAM (Bilateral ) CYSTOSCOPY WITH STENT REPLACEMENT (Bilateral )  Patient Location: PACU  Anesthesia Type:General  Level of Consciousness: awake, alert  and oriented  Airway & Oxygen Therapy: Patient Spontanous Breathing and Patient connected to face mask oxygen  Post-op Assessment: Report given to RN and Post -op Vital signs reviewed and stable  Post vital signs: Reviewed and stable  Last Vitals:  Vitals Value Taken Time  BP 123/71 11/21/2017  9:49 AM  Temp 36.6 C 11/21/2017  9:49 AM  Pulse 92 11/21/2017  9:50 AM  Resp 17 11/21/2017  9:50 AM  SpO2 100 % 11/21/2017  9:50 AM  Vitals shown include unvalidated device data.  Last Pain:  Vitals:   11/21/17 0809  TempSrc: Temporal         Complications: No apparent anesthesia complications

## 2017-11-21 NOTE — Discharge Instructions (Signed)

## 2017-11-22 ENCOUNTER — Encounter: Payer: Self-pay | Admitting: Urology

## 2017-12-04 ENCOUNTER — Encounter: Payer: Self-pay | Admitting: Urology

## 2017-12-04 ENCOUNTER — Telehealth: Payer: Self-pay | Admitting: Family Medicine

## 2017-12-04 MED ORDER — MORPHINE SULFATE ER 30 MG PO TBCR: 30.0000 mg | EXTENDED_RELEASE_TABLET | Freq: Two times a day (BID) | ORAL | 0 refills | Status: DC

## 2017-12-04 MED ORDER — HYDROCODONE-ACETAMINOPHEN 10-325 MG PO TABS: 1.0000 | ORAL_TABLET | ORAL | 0 refills | Status: DC | PRN

## 2017-12-04 NOTE — Telephone Encounter (Signed)
I called and spoke with Michael Small. She states she mistakenly thought he was on MS Contin. Michael Small wants to know if you could change him to MS Contin. She thinks this would be better for him with pain control. Patient is currently taking 6-8 hydrocodone a day.  She recommends changing him to MS Contin 15mg  or 30mg  twice a day. Please advise.

## 2017-12-04 NOTE — Telephone Encounter (Signed)
Gamble is having a lot more pain.  She is asking for a refill on his MS Contin and Hydrocodone.  [facility] [address]   Please let Devaina know when this is done.

## 2017-12-04 NOTE — Telephone Encounter (Signed)
Please clarify, I only have record of prescribing morphine IR, I don't have any record of prescription for MS contin. Did they want a refill of morphine IR or change to MS Contin.

## 2017-12-12 ENCOUNTER — Telehealth: Payer: Self-pay | Admitting: Family Medicine

## 2017-12-12 MED ORDER — OXYCODONE HCL ER 20 MG PO T12A: 20.0000 mg | EXTENDED_RELEASE_TABLET | Freq: Two times a day (BID) | ORAL | 0 refills | Status: DC

## 2017-12-12 NOTE — Telephone Encounter (Signed)
Please advise 

## 2017-12-12 NOTE — Telephone Encounter (Signed)
[name] with [facility] stated the extended release morphine seems to be making pt vomit and [name] is requesting pt to try extended release oxycodone. Please send Rx to [facility]. Please advise. Thanks TNP

## 2017-12-19 ENCOUNTER — Telehealth: Payer: Self-pay | Admitting: Family Medicine

## 2017-12-19 NOTE — Telephone Encounter (Signed)
Please advise 

## 2017-12-19 NOTE — Telephone Encounter (Signed)
LMOVM for [name] to cb.

## 2017-12-19 NOTE — Telephone Encounter (Signed)
Please clarify. We called in prescription fo 20mg  Q12 hours OxyContin last week. This is the equivalant of taking 10mg  oxycodone every six hours. Is he still taking OxyContin? There should be a weeks worth left. If so we will send in 10mg  tablets for breakthrough pain.

## 2017-12-19 NOTE — Telephone Encounter (Signed)
[name] with [facility] would like to make a change in patients  Pain medication. Pain Medication not working would like to make a change to it. Oxycodone 10 mg every 6 hours around the clock and then Oxycodone 10 MG every 3 hours as needed for break thought pain. [facility] [address] Thanks CC

## 2017-12-20 MED ORDER — OXYCODONE HCL 10 MG PO TABS: ORAL_TABLET | ORAL | 0 refills | Status: DC

## 2017-12-20 NOTE — Telephone Encounter (Signed)
Called [name] back. Per [name] patient did not get Oxycontin 20 mg filled last week due to Hospice not coving it. [name] stated that patient was also unable to to take morphine due to medication making the patient sick on his stomach. [name] states Hospice is willing to cover pain medication combination from original message below. Also patient has stopped taking Flomax. If patient is suppose to be being taking Flomax he will need a refills sent to pharmacy. Please advise?

## 2017-12-21 ENCOUNTER — Ambulatory Visit: Admitting: Urology

## 2018-01-01 ENCOUNTER — Encounter: Payer: Self-pay | Admitting: Urology

## 2018-01-02 ENCOUNTER — Ambulatory Visit (INDEPENDENT_AMBULATORY_CARE_PROVIDER_SITE_OTHER): Payer: Medicare Other

## 2018-01-02 ENCOUNTER — Other Ambulatory Visit: Payer: Self-pay

## 2018-01-02 VITALS — Wt 139.1 lb

## 2018-01-02 DIAGNOSIS — N1330 Unspecified hydronephrosis: Secondary | ICD-10-CM | POA: Diagnosis not present

## 2018-01-02 DIAGNOSIS — N135 Crossing vessel and stricture of ureter without hydronephrosis: Secondary | ICD-10-CM | POA: Diagnosis not present

## 2018-01-02 DIAGNOSIS — C61 Malignant neoplasm of prostate: Secondary | ICD-10-CM

## 2018-01-02 LAB — URINALYSIS, COMPLETE
Bilirubin, UA: NEGATIVE
GLUCOSE, UA: NEGATIVE
Nitrite, UA: NEGATIVE
SPEC GRAV UA: 1.02 (ref 1.005–1.030)
Urobilinogen, Ur: 0.2 mg/dL (ref 0.2–1.0)
pH, UA: 5.5 (ref 5.0–7.5)

## 2018-01-02 LAB — MICROSCOPIC EXAMINATION

## 2018-01-02 MED ORDER — LEUPROLIDE ACETATE (3 MONTH) 22.5 MG IM KIT
22.5000 mg | PACK | Freq: Once | INTRAMUSCULAR | Status: AC
Start: 2018-01-02 — End: 2018-01-02
  Administered 2018-01-02: 22.5 mg via INTRAMUSCULAR

## 2018-01-02 NOTE — Progress Notes (Signed)
Lupron IM Injection   Due to Prostate Cancer patient is present today for a Lupron Injection.  Medication: Lupron 3 month Dose: 22.5 mg  Location: right upper outer buttocks Lot: [id number] Exp: 01/04/2020  Patient tolerated well, no complications were noted  Performed by: Cristie Hem, CMA  Follow up: As Scheduled

## 2018-01-05 LAB — CULTURE, URINE COMPREHENSIVE

## 2018-01-08 ENCOUNTER — Telehealth: Payer: Self-pay

## 2018-01-08 DIAGNOSIS — K529 Noninfective gastroenteritis and colitis, unspecified: Secondary | ICD-10-CM

## 2018-01-08 MED ORDER — DIPHENOXYLATE-ATROPINE 2.5-0.025 MG PO TABS
1.0000 | ORAL_TABLET | Freq: Two times a day (BID) | ORAL | 3 refills | Status: DC | PRN
Start: 2018-01-08 — End: 2018-02-07

## 2018-01-08 NOTE — Telephone Encounter (Signed)
Please advise 

## 2018-01-08 NOTE — Telephone Encounter (Signed)
Patient's wife Parke Simmers is requesting a refill ondiphenoxylate-atropine (LOMOTIL) 2.5-0.025 MG tablet [facility]. Last RF was on 11/21/17 with 3 refills. Edna states pharmacy will not refill medication without Dr. Maralyn Sago consent because it is to early. BB#[phone]

## 2018-01-12 ENCOUNTER — Telehealth: Payer: Self-pay | Admitting: Family Medicine

## 2018-01-12 NOTE — Telephone Encounter (Signed)
Form placed on Dr. Sabino Snipes desk for review.

## 2018-01-12 NOTE — Telephone Encounter (Signed)
Pt's wife Parke Simmers dropped off insurance forms to be completed to help with medical bills. Parke Simmers request call once form is completed. Form was placed in provider's box. Please advise. Thanks TNP

## 2018-01-17 ENCOUNTER — Other Ambulatory Visit: Payer: Self-pay | Admitting: Family Medicine

## 2018-01-17 NOTE — Telephone Encounter (Signed)
Patient's wife is requesting refills on [facility] pain meds - Norco 10-325 mg. and Oxycodone 10 mg. [facility] [address].

## 2018-01-18 MED ORDER — OXYCODONE HCL 10 MG PO TABS: ORAL_TABLET | ORAL | 0 refills | Status: DC

## 2018-01-18 MED ORDER — HYDROCODONE-ACETAMINOPHEN 10-325 MG PO TABS: 1.0000 | ORAL_TABLET | ORAL | 0 refills | Status: DC | PRN

## 2018-01-23 ENCOUNTER — Encounter: Payer: Self-pay | Admitting: Urology

## 2018-01-23 ENCOUNTER — Ambulatory Visit (INDEPENDENT_AMBULATORY_CARE_PROVIDER_SITE_OTHER): Payer: Medicare Other | Admitting: Urology

## 2018-01-23 VITALS — BP 181/77 | HR 73 | Ht 70.0 in | Wt 140.8 lb

## 2018-01-23 DIAGNOSIS — C61 Malignant neoplasm of prostate: Secondary | ICD-10-CM | POA: Diagnosis not present

## 2018-01-24 ENCOUNTER — Encounter: Payer: Self-pay | Admitting: Urology

## 2018-01-24 LAB — PSA: PROSTATE SPECIFIC AG, SERUM: 342 ng/mL — AB (ref 0.0–4.0)

## 2018-01-24 NOTE — Progress Notes (Signed)
01/23/2018 6:54 AM   Michael Small [birth year] [id number]  Referring provider: Birdie Sons, [place], [place] [address]  Chief Complaint  Patient presents with  . Prostate Cancer   Urologic history: 1. 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 Pilar Jarvis December 2018 with a PSA of 2339 and CT showing extensive pelvic, retroperitoneal and perirectal adenopathy with bilateral hydronephrosis. -Biopsy performed 12/26 under anesthesia with suspicion of invasion of the rectal wall confirmed on biopsy.  Underwent placement of a right ureteral stent and left ureteral orifice could not be identified Bone scan with diffuse metastatic disease -Started ADT January 2019 (Lupron without antiandrogen) -Admitted 1/15 with acute renal failure (creatinine 6.91) and PSA greater than 3000.  Given [place].  Bilateral percutaneous nephrostomy tubes placed -Left nephrostomy internalized 06/2017 -Bilateral nephrostomy tubes removed 08/18/2017 -Cystoscopy with bilateral retrograde pyelograms showed persistent obstruction and underwent stent exchange 11/21/2017 -PSA trend:  03/2017     2338.8  05/2017       3603  06/2017       1035  08/2017       241  12/2017       28  HPI: 62 year old male presents for follow-up of prostate cancer.  Overall he feels he is doing better.  He has no significant bony pain.  He is voiding without problems.  His energy level has improved.  He received a 36-month Lupron injection on 01/02/2018.  His last PSA and April 2019 was 241.   PMH: Past Medical History:  Diagnosis Date  . Chronic kidney disease   . Depression   . GERD (gastroesophageal reflux disease)   . Hyperlipidemia   . Hypertension   . Myocardial infarction [facility])    s/p stent  . Neuromuscular disorder (HCC)    numbness in arms and legs  . Prostate cancer [facility])     Surgical History: Past Surgical History:  Procedure Laterality Date  .  CORONARY ANGIOPLASTY WITH STENT PLACEMENT  12/2011   [facility] Cardiology  . CYSTOSCOPY W/ RETROGRADES Bilateral 05/24/2017   Procedure: CYSTOSCOPY WITH RETROGRADE PYELOGRAM;  Surgeon: [name], MD;  Location: [facility] OR[facility];  Service: Urology;  Laterality: Bilateral;  . CYSTOSCOPY W/ RETROGRADES Bilateral 11/21/2017   Procedure: CYSTOSCOPY WITH RETROGRADE PYELOGRAM;  Surgeon: [name], MD;  Location: [facility];  Service: Urology;  Laterality: Bilateral;  . CYSTOSCOPY W/ URETERAL STENT PLACEMENT Bilateral 11/21/2017   Procedure: CYSTOSCOPY WITH STENT REPLACEMENT;  Surgeon: [name], MD;  Location: [facility];  Service: Urology;  Laterality: Bilateral;  . CYSTOSCOPY W/ URETERAL STENT REMOVAL Bilateral 11/21/2017   Procedure: CYSTOSCOPY WITH STENT REMOVAL;  Surgeon: [name], MD;  Location: [facility];  Service: Urology;  Laterality: Bilateral;  . CYSTOSCOPY WITH STENT PLACEMENT Right 05/24/2017   Procedure: CYSTOSCOPY WITH STENT PLACEMENT;  Surgeon: [name], MD;  Location: [facility];  Service: Urology;  Laterality: Right;  . IR NEPHROSTOGRAM LEFT THRU EXISTING ACCESS  07/21/2017  . IR NEPHROSTOGRAM LEFT THRU EXISTING ACCESS  08/18/2017  . IR NEPHROSTOGRAM RIGHT THRU EXISTING ACCESS  08/18/2017  . IR NEPHROSTOMY PLACEMENT LEFT  06/15/2017  . IR NEPHROSTOMY PLACEMENT RIGHT  06/14/2017  . IR URETERAL STENT PLACEMENT EXISTING ACCESS LEFT  07/21/2017  . PICC LINE INSERTION N/A 08/02/2017   Procedure: PICC LINE INSERTION;  Surgeon: Katha Cabal, MD;  Location: [place] CV LAB;  Service: Cardiovascular;  Laterality: N/A;  . PROSTATE BIOPSY  N/A 05/24/2017   Procedure: PROSTATE BIOPSY;  Surgeon: [name], MD;  Location: [facility];  Service: Urology;  Laterality: N/A;  . TEE WITHOUT CARDIOVERSION N/A 07/31/2017   Procedure: TRANSESOPHAGEAL ECHOCARDIOGRAM (TEE);  Surgeon: Teodoro Spray, MD;  Location: [facility] OR[facility];  Service: Cardiovascular;  Laterality: N/A;  . US  ECHOCARDIOGRAPHY  12/2011   Mild LV dysfunction, EF=45%    Home Medications:  Allergies as of 01/23/2018   No Known Allergies     Medication List        Accurate as of 01/23/18 11:59 PM. Always use your most recent med list.          diphenoxylate-atropine 2.5-0.025 MG tablet Commonly known as:  LOMOTIL Take 1 tablet by mouth 2 (two) times daily as needed for diarrhea or loose stools.   feeding supplement (ENSURE ENLIVE) Liqd Take 237 mLs by mouth 3 (three) times daily between meals.   HYDROcodone-acetaminophen 10-325 MG tablet Commonly known as:  NORCO Take 1 tablet by mouth every 3 (three) hours as needed. For cancer related pain Dx: C61   levofloxacin 500 MG tablet Commonly known as:  LEVAQUIN Take 1 tablet (500 mg total) by mouth daily.   ondansetron 8 MG tablet Commonly known as:  ZOFRAN Take 8 mg by mouth every 8 (eight) hours as needed. for nausea   Oxycodone HCl 10 MG Tabs One tablet every six hours scheduled, and one every three hours as needed for breakthrough pain       Allergies: No Known Allergies  Family History: Family History  Problem Relation Age of Onset  . Cancer Mother   . Hypertension Mother   . Diabetes Father   . Heart disease Father   . Cancer Paternal Grandfather   . Prostate cancer Neg Hx   . Bladder Cancer Neg Hx   . Kidney cancer Neg Hx     Social History:  reports that he has been smoking cigars. He has a 48.00 pack-year smoking history. He has never used smokeless tobacco. He reports that he does not drink alcohol or use drugs.  ROS: UROLOGY Frequent Urination?: No Hard to postpone urination?: No Burning/pain with urination?: No Get up at night to urinate?: No Leakage of urine?: No Urine stream starts and stops?: No Trouble starting stream?: No Do you have to strain to urinate?: No Blood in urine?: No Urinary tract infection?: No Sexually transmitted disease?: No Injury to kidneys or bladder?: No Painful intercourse?:  No Weak stream?: No Erection problems?: No Penile pain?: No  Gastrointestinal Nausea?: No Vomiting?: No Indigestion/heartburn?: No Diarrhea?: No Constipation?: No  Constitutional Fever: No Night sweats?: No Weight loss?: No Fatigue?: No  Skin Skin rash/lesions?: No Itching?: Yes  Eyes Blurred vision?: No Double vision?: No  Ears/Nose/Throat Sore throat?: No Sinus problems?: Yes  Hematologic/Lymphatic Swollen glands?: No Easy bruising?: No  Cardiovascular Leg swelling?: Yes Chest pain?: No  Respiratory Cough?: No Shortness of breath?: No  Endocrine Excessive thirst?: No  Musculoskeletal Back pain?: Yes Joint pain?: No  Neurological Headaches?: No Dizziness?: No  Psychologic Depression?: No Anxiety?: No  Physical Exam: BP (!) 181/77 (BP Location: Left Arm, Patient Position: Sitting, Cuff Size: Normal)   Pulse 73   Ht 5\' 10"  (1.778 m)   Wt 140 lb 12.8 oz (63.9 kg)   BMI 20.20 kg/m   Constitutional:  Alert and oriented, No acute distress. HEENT: [place] AT, moist mucus membranes.  Trachea midline, no masses. Cardiovascular: No clubbing, cyanosis, or edema. Respiratory: Normal  respiratory effort, no increased work of breathing. GI: Abdomen is soft, nontender, nondistended, no abdominal masses GU: No CVA tenderness Lymph: No cervical or inguinal lymphadenopathy. Skin: No rashes, bruises or suspicious lesions. Neurologic: Grossly intact, no focal deficits, moving all 4 extremities. Psychiatric: Normal mood and affect.    Assessment & Plan:    1. 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  He would be a candidate for enzalutamide/apalutamide.  Will set up an appointment with medical oncology for a discussion of additional treatment options.  He will be due for stent exchange late October/early November 2019    [name], MD  [facility] [address] [place], [place] [address] [phone]

## 2018-01-26 NOTE — Telephone Encounter (Signed)
The insurance placed called back asking if the form can be fax again. Reports is hard to read the DX.

## 2018-01-26 NOTE — Telephone Encounter (Signed)
Please advise form.

## 2018-01-26 NOTE — Telephone Encounter (Signed)
I don't have the form, it was done two weeks ago. You need to get it from medical records.

## 2018-01-31 NOTE — Telephone Encounter (Signed)
Form was refaxed to [facility] @[phone].

## 2018-02-06 ENCOUNTER — Other Ambulatory Visit: Payer: Self-pay | Admitting: Urology

## 2018-02-06 DIAGNOSIS — C61 Malignant neoplasm of prostate: Secondary | ICD-10-CM

## 2018-02-07 ENCOUNTER — Ambulatory Visit (INDEPENDENT_AMBULATORY_CARE_PROVIDER_SITE_OTHER): Payer: Medicare Other | Admitting: Family Medicine

## 2018-02-07 ENCOUNTER — Encounter: Payer: Self-pay | Admitting: Family Medicine

## 2018-02-07 VITALS — BP 180/72 | HR 64 | Temp 97.9°F | Resp 16 | Wt 143.0 lb

## 2018-02-07 DIAGNOSIS — R609 Edema, unspecified: Secondary | ICD-10-CM | POA: Diagnosis not present

## 2018-02-07 DIAGNOSIS — L03818 Cellulitis of other sites: Secondary | ICD-10-CM

## 2018-02-07 DIAGNOSIS — K529 Noninfective gastroenteritis and colitis, unspecified: Secondary | ICD-10-CM

## 2018-02-07 MED ORDER — FUROSEMIDE 20 MG PO TABS: 20.0000 mg | ORAL_TABLET | Freq: Every day | ORAL | 2 refills | Status: DC | PRN

## 2018-02-07 MED ORDER — DOXYCYCLINE HYCLATE 100 MG PO TABS: 100.0000 mg | ORAL_TABLET | Freq: Two times a day (BID) | ORAL | 0 refills | Status: AC

## 2018-02-07 MED ORDER — DIPHENOXYLATE-ATROPINE 2.5-0.025 MG PO TABS: 1.0000 | ORAL_TABLET | Freq: Four times a day (QID) | ORAL | 3 refills | Status: AC | PRN

## 2018-02-07 NOTE — Progress Notes (Signed)
Patient: Michael Small Male    DOB: 08-01-1955   62 y.o.   MRN: [id number] Visit Date: 02/07/2018  Today's Provider: [name], MD   Chief Complaint  Patient presents with  . Leg Swelling   Subjective:    HPI Leg swelling: Patient comes in today reporting that he has had swelling in both legs for several months. The swelling is worse in the lower left leg. He says his legs become so tight from the swelling, that they burst open and drain. Patient has some redness around the lower left leg.   He also states that he continues to have trouble with frequent diarrhea. Uses Lomotile twice a day which only works for a few hours.     No Known Allergies    Current Outpatient Medications:  .  diphenoxylate-atropine (LOMOTIL) 2.5-0.025 MG tablet, Take 1 tablet by mouth 2 (two) times daily as needed for diarrhea or loose stools., Disp: 30 tablet, Rfl: 3 .  feeding supplement, ENSURE ENLIVE, (ENSURE ENLIVE) LIQD, Take 237 mLs by mouth 3 (three) times daily between meals., Disp: 237 mL, Rfl: 12 .  HYDROcodone-acetaminophen (NORCO) 10-325 MG tablet, Take 1 tablet by mouth every 3 (three) hours as needed. For cancer related pain Dx: C61, Disp: 120 tablet, Rfl: 0 .  ondansetron (ZOFRAN) 8 MG tablet, Take 8 mg by mouth every 8 (eight) hours as needed. for nausea, Disp: , Rfl: 1 .  Oxycodone HCl 10 MG TABS, One tablet every six hours scheduled, and one every three hours as needed for breakthrough pain, Disp: 120 tablet, Rfl: 0  Review of Systems  Constitutional: Negative for appetite change, chills and fever.  Respiratory: Negative for chest tightness, shortness of breath and wheezing.   Cardiovascular: Positive for leg swelling. Negative for chest pain and palpitations.  Gastrointestinal: Negative for abdominal pain, nausea and vomiting.  Skin: Positive for color change and wound.    Social History   Tobacco Use  . Smoking status: Current Every Day Smoker    Packs/day: 1.00   Years: 48.00    Pack years: 48.00    Types: Cigars  . Smokeless tobacco: Never Used  . Tobacco comment: used to amoke 2PPD- lately cut down to 1/2 PPD  Substance Use Topics  . 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 2+ edema both lower legs. Non-tender.          Assessment & Plan:     1. Chronic diarrhea Increase- diphenoxylate-atropine (LOMOTIL) 2.5-0.025 MG tablet; Take 1 tablet by mouth 4 (four) times daily as needed for diarrhea or loose stools.  Dispense: 120 tablet; Refill: 3  2. Edema, unspecified type  -  furosemide (LASIX) 20 MG tablet; Take 1 tablet (20 mg total) by mouth daily as needed for fluid.  Dispense: 30 tablet; Refill: 2  3. Cellulitis of other specified site  - doxycycline (VIBRA-TABS) 100 MG tablet; Take 1 tablet (100 mg total) by mouth 2 (two) times daily for 14 days.  Dispense: 28 tablet; Refill: 0  Call if symptoms change or if not rapidly improving.           [name], MD  [facility]

## 2018-02-12 ENCOUNTER — Encounter: Payer: Self-pay | Admitting: Hematology and Oncology

## 2018-02-14 ENCOUNTER — Other Ambulatory Visit: Payer: Self-pay | Admitting: Radiology

## 2018-02-14 ENCOUNTER — Other Ambulatory Visit: Payer: Self-pay | Admitting: Family Medicine

## 2018-02-14 DIAGNOSIS — C61 Malignant neoplasm of prostate: Secondary | ICD-10-CM

## 2018-02-14 DIAGNOSIS — N135 Crossing vessel and stricture of ureter without hydronephrosis: Secondary | ICD-10-CM

## 2018-02-15 ENCOUNTER — Other Ambulatory Visit: Payer: Self-pay | Admitting: Family Medicine

## 2018-02-15 NOTE — Telephone Encounter (Signed)
Pt needing refills:  Hydrocodone-acetaminophen 10-325 mg Oxycodone HCI 10 mg  [facility] [place], [place]

## 2018-02-16 ENCOUNTER — Ambulatory Visit: Payer: Medicare Other | Admitting: Hematology and Oncology

## 2018-02-16 MED ORDER — HYDROCODONE-ACETAMINOPHEN 10-325 MG PO TABS: 1.0000 | ORAL_TABLET | ORAL | 0 refills | Status: DC | PRN

## 2018-02-16 MED ORDER — OXYCODONE HCL 10 MG PO TABS: ORAL_TABLET | ORAL | 0 refills | Status: DC

## 2018-02-17 NOTE — Progress Notes (Deleted)
[facility] day:  02/17/2018   Chief Complaint: Michael Small is a 62 y.o. male with metastatic prostate cancer who is seen for 8 month reassessment.  HPI:   The patient was last seen in the medical oncology clinic on 06/29/2017.  At that time, he felt fatigued.  He continued to have loose stools, but less often on Lomotil.  He had mild nausea. He had lost 35-45 pounds in the past 6 months.  He was afraid to eat.   At last visit, we discussed treatment for metastatic prostate cancer.  We discussed Taxotere. Dr. John Giovanni was contacted regarding thickening of bladder noted on imaging and irregular appearing tissue on the left hemitrigone with hypervascularity noted on cystoscopy.  He was presented at tumor board.  The etiology of his diarrhea was felt possibly due to bowel obstruction.  Consideration was made for carcinoid testing (24 hour urine for 5HIAA and serum chromagranin A).  Chest CT suggested a right perihilar mass.  Biopsy was not suggested.  GI evaluation was pending.  Consideration was made for a diverting colostomy if obstruction was imminent.    He was lost to follow-up.   He was admitted to [facility] from 07/28/2017 - 07/31/2017 with sepsis secondary to UTI with MSSA bacteremia.  TEE was negative.  A PICC line was placed on 08/02/2017 for 2 weeks of antibiotics.  Discussions were held regarding bilateral nephrostomy tube removal.  He wanted Hospice arranged at home.  On 11/21/2017 he underwent cystoscopy with bilateral stent removal, bilateral retrograde pyelography, and exchange of ureteral stent bilaterally.  He was seen by Dr. Bernardo Heater on 01/23/2018.  Symptomatically, he was feeling better.  He denied bone pain.  He denied voiding issues.  Three month Lupron injection was given on 01/02/2018.  Discussions were held regarding enzalutamide/apalutamide.  Stent exchange is planned for late 02/2018 or early 03/2018.    Past Medical History:   Diagnosis Date  . Chronic kidney disease   . Depression   . GERD (gastroesophageal reflux disease)   . Hyperlipidemia   . Hypertension   . Myocardial infarction [facility])    s/p stent  . Neuromuscular disorder (HCC)    numbness in arms and legs  . Prostate cancer [facility])     Past Surgical History:  Procedure Laterality Date  . CORONARY ANGIOPLASTY WITH STENT PLACEMENT  12/2011   [facility]  . CYSTOSCOPY W/ RETROGRADES Bilateral 05/24/2017   Procedure: CYSTOSCOPY WITH RETROGRADE PYELOGRAM;  Surgeon: [name], MD;  Location: [facility] ORS;  Service: Urology;  Laterality: Bilateral;  . CYSTOSCOPY W/ RETROGRADES Bilateral 11/21/2017   Procedure: CYSTOSCOPY WITH RETROGRADE PYELOGRAM;  Surgeon: [name], MD;  Location: [facility] ORS;  Service: Urology;  Laterality: Bilateral;  . CYSTOSCOPY W/ URETERAL STENT PLACEMENT Bilateral 11/21/2017   Procedure: CYSTOSCOPY WITH STENT REPLACEMENT;  Surgeon: [name], MD;  Location: [facility] ORS;  Service: Urology;  Laterality: Bilateral;  . CYSTOSCOPY W/ URETERAL STENT REMOVAL Bilateral 11/21/2017   Procedure: CYSTOSCOPY WITH STENT REMOVAL;  Surgeon: [name], MD;  Location: [facility] ORS;  Service: Urology;  Laterality: Bilateral;  . CYSTOSCOPY WITH STENT PLACEMENT Right 05/24/2017   Procedure: CYSTOSCOPY WITH STENT PLACEMENT;  Surgeon: [name], MD;  Location: [facility] ORS;  Service: Urology;  Laterality: Right;  . IR NEPHROSTOGRAM LEFT THRU EXISTING ACCESS  07/21/2017  . IR NEPHROSTOGRAM LEFT THRU EXISTING ACCESS  08/18/2017  . IR NEPHROSTOGRAM RIGHT THRU EXISTING ACCESS  08/18/2017  . IR NEPHROSTOMY PLACEMENT LEFT  06/15/2017  . IR NEPHROSTOMY PLACEMENT RIGHT  06/14/2017  . IR URETERAL STENT PLACEMENT EXISTING ACCESS LEFT  07/21/2017  . PICC LINE INSERTION N/A 08/02/2017   Procedure: PICC LINE INSERTION;  Surgeon: Katha Cabal, MD;  Location: [place] [facility];  Service: Cardiovascular;  Laterality: N/A;  . PROSTATE BIOPSY  N/A 05/24/2017   Procedure: PROSTATE BIOPSY;  Surgeon: [name], MD;  Location: [facility] ORS;  Service: Urology;  Laterality: N/A;  . TEE WITHOUT CARDIOVERSION N/A 07/31/2017   Procedure: TRANSESOPHAGEAL ECHOCARDIOGRAM (TEE);  Surgeon: Teodoro Spray, MD;  Location: [facility] ORS;  Service: Cardiovascular;  Laterality: N/A;  . US ECHOCARDIOGRAPHY  12/2011   Mild LV dysfunction, EF=45%    Family History  Problem Relation Age of Onset  . Cancer Mother   . Hypertension Mother   . Diabetes Father   . Heart disease Father   . Cancer Paternal Grandfather   . Prostate cancer Neg Hx   . Bladder Cancer Neg Hx   . Kidney cancer Neg Hx     Social History:  reports that he has been smoking cigars. He has a 48.00 pack-year smoking history. He has never used smokeless tobacco. He reports that he does not drink alcohol or use drugs.  He has smoked 1 pack per day since age 5 (65 years).  He smokes small cigars.  He does not drink alcohol, but previously drank heavily.  He describes being exposed to Round Up and asbestos.  He has been disabled for several years secondary to "nerve damage and problems in his legs, arms, and bad back".  He fell off a ladder.  He previously worked for a [employer], [employer].  He lives in [place].  He has step children.  The patient is accompanied by his wife today.  Allergies: No Known Allergies  Current Medications: Current Outpatient Medications  Medication Sig Dispense Refill  . diphenoxylate-atropine (LOMOTIL) 2.5-0.025 MG tablet Take 1 tablet by mouth 4 (four) times daily as needed for diarrhea or loose stools. 120 tablet 3  . doxycycline (VIBRA-TABS) 100 MG tablet Take 1 tablet (100 mg total) by mouth 2 (two) times daily for 14 days. 28 tablet 0  . feeding supplement, ENSURE ENLIVE, (ENSURE ENLIVE) LIQD Take 237 mLs by mouth 3 (three) times daily between meals. 237 mL 12  . furosemide (LASIX) 20 MG tablet Take 1 tablet (20 mg total) by mouth daily as needed for  fluid. 30 tablet 2  . HYDROcodone-acetaminophen (NORCO) 10-325 MG tablet Take 1 tablet by mouth every 3 (three) hours as needed. For cancer related pain Dx: C61 120 tablet 0  . ondansetron (ZOFRAN) 8 MG tablet TAKE 1 TABLET BY MOUTH EVERY 8 HOURS AS NEEDED FOR NAUSEA 30 tablet 5  . Oxycodone HCl 10 MG TABS One tablet every six hours scheduled, and one every three hours as needed for breakthrough pain 120 tablet 0   No current facility-administered medications for this visit.     Review of Systems:  GENERAL:  Weak and fatigued.  Not much energy.  No fevers or sweats.  Baseline weight 190-200#.  Weight loss of 35-45# in 6 months. PERFORMANCE STATUS (ECOG):  2. HEENT:  Runny nose.  No visual changes, sore throat, mouth sores or tenderness. Lungs: No shortness of breath or cough.  Little congestion.  No hemoptysis. Cardiac:  No chest pain, palpitations, orthopnea, or PND. h/o MI. GI:  Diarrhea, improved with Lomotil.  Occasional nausea  with water.  No vomiting, constipation, melena or hematochezia. GU:  No urgency, frequency, dysuria, or hematuria. Musculoskeletal:  No back pain.  Intermittent left shoulder discomfort.  Arthritis.  No muscle tenderness. Extremities:  No pain or swelling. Skin:  No rashes or skin changes. Neuro:  No headache, numbness or weakness, balance or coordination issues. Endocrine:  No diabetes, thyroid issues, or night sweats.  Hot flashes secondary to Lupron/Degarelix. Psych:  No mood changes, depression or anxiety. Pain:  Little soreness associated with stents. Review of systems:  All other systems reviewed and found to be negative.  Physical Exam: There were no vitals taken for this visit. GENERAL:  Thin gentleman sitting comfortably in the exam room in no acute distress. MENTAL STATUS:  Alert and oriented to person, place and time. HEAD:   Alopecia.  Normocephalic, atraumatic, face symmetric, no Cushingoid features. EYES:  Blue eyes.  Light brown beard.  No  conjunctivitis or scleral icterus. BACK:  Bilateral nephrostomy tubes. EXTREMITIES: Right lower extremity edema.  No skin discoloration or tenderness.   NEUROLOGICAL: Unremarkable. PSYCH:  Appropriate.   No visits with results within 3 Day(s) from this visit.  Latest known visit with results is:  Office Visit on 01/23/2018  Component Date Value Ref Range Status  . Prostate Specific Ag, Serum 01/23/2018 342.0* 0.0 - 4.0 ng/mL Final   Comment: Results confirmed on dilution. **Verified by repeat analysis** Roche ECLIA methodology. According to the American Urological Association, Serum PSA should decrease and remain at undetectable levels after radical prostatectomy. The AUA defines biochemical recurrence as an initial PSA value 0.2 ng/mL or greater followed by a subsequent confirmatory PSA value 0.2 ng/mL or greater. Values obtained with different assay methods or kits cannot be used interchangeably. Results cannot be interpreted as absolute evidence of the presence or absence of malignant disease.     Assessment:  Michael Small is a 62 y.o. male with metastatic prostate cancer s/p transrectal prostate biopsies and cystoscopy on 05/24/2017.  Prostate biopsies revealed acinar adenocarcinoma involving all 12 cores.  Adenocarcinoma involved colonic mucosa and skeletal muscle.  Gleason score was 9 (4+5), grade group 5.  Pathologic stage was T4N1M1c.  PSA was 2338.8 on 04/24/2017.  Abdomen and pelvic CT on 04/27/2017 revealed prominent pelvic adenopathy with pathologic retroperitoneal adenopathy, abnormal retroperitoneal stranding, and abnormal inflammatory type stranding along the pelvic sidewalls and perirectal space.  A representative right external iliac lymph node measured 3.2 cm.  A left external iliac lymph node measured 2.1 cm.  There was conglomerate left periaortic adenopathy 2.1 cm and a 1.4 cm aortocaval node.   There was abnormal adenopathy in the perirectal space with prominent  wall thickening in the rectum and moderate wall thickening in the sigmoid colon. The findings along the pelvic sidewalls appeared to be causing distal ureteral obstruction resulting in moderate bilateral hydronephrosis and hydroureter.  Lymphoma or prostate cancer was favored. A right external iliac node measured 3.2 cm.  There was bladder wall thickening suggesting cystitis.  There was prostatomegaly.  Bone scan on 06/22/2017 revealed findings consistent with diffuse metastatic disease.  There was activity in right temporal area, sternum, anterior and posterior ribs bilaterally, and entire spine.  Non-contrast chest CT on 06/27/2017 revealed right infrahilar prominence, progressed from prior CT, worrisome for a node/nodule, but poorly visualized given the lack of IV contrast.  There was mid thoracic lymphadenopathy, progressed, including a dominant 14 mm short axis node at the left thoracic inlet.  There was stable 19 x 9  mm irregular nodule at the right lung apex.  There was a sclerotic osseous metastases at T8-9.  He received Lupron 22.5 mg (06/01/2017, 10/02/2017, and 01/02/2018) and Degarelix 240 mg on 06/15/2017.  He received Casodex from 06/13/2017 - 06/15/2017.  Testosterone was 98 on 06/14/2017 and < 3 on 09/07/2017.  PSA has been followed:  2338.8 on 04/24/2017, 3603.0 on 06/23/2017, 1035 on 07/26/2017, 241 on 09/07/2017, and 342 on 01/23/2018.  He was admitted to [facility] from 06/12/2017 - 06/15/2017 with hyperkalemia and acute renal failure.  Creatinine was 7.23.  He underwent emergent dialysis x1. He is s/p bilateral external nephrostomy tube placement on 06/14/2017.  He was admitted to [facility] from 07/28/2017 - 07/31/2017 with sepsis secondary to UTI with MSSA bacteremia.    Bilateral lower extremity duplex on 06/12/2017 revealed no evidence of DVT.   Code status is DNR/DNI.  Symptomatically,  he feels fatigued.  He continues to have loose stools, but less often on Lomotil.  He has mild  nausea. He has lost 35-45 pounds in the past 6 months.  He is afraid to eat.    Plan: 1.  Labs today:  CBC with diff, CMP, PSA, testosterone. 2.  Metastatic prostate cancer:  Patient currently on Lupron every 3 months (last 01/02/2018).  Patient previously on Casodex (last x/x/2019).  Discuss patient's thoughts about chemotherapy (Taxotere).  Discuss initiation of abiraterone + prednisone.  Informatation provided. 3.  Bone metastasis:  Discuss consideration of Xgeva.   Discuss results of bone scan and chest CT.  Discuss widely metastatic disease. 2.  Discuss diagnosis of advanced prostate cancer. Patient to be discussed at multi-disciplinary tumor board later today. Contact patient following tumor board discussions this afternoon to discuss consensus regarding direction of therapy.  3.  Discuss potential treatment options.  He has received received hormonal therapy (Lupron then Degarelix).  He appears to have had a tumor flare.  Discuss benefit of added chemotherapy (Taxotere).  Potential side effects reviewed.  Discuss concern for general debilitated condition with inability to eat and ongoing issues with diarrhea.  Etiology of diarrhea unclear.  Pathology revealed evidence of colon involvement.  He has never had a colonoscopy.  Await GI consultation. 4.  Discuss prognosis. In the absence of treatment, patient's life expectancy is < 6 months. With treatment, life could be extended (2+ years) depending on his response to treatment.  5.  Discuss code status. Code status confirmed as DNR/DNI today.  6.  Discuss diarrhea. Continue Lomotil as prescribed. Will try to expedite GI consult.  7.  Contact Dr. John Giovanni re: thickening of bladder noted on imaging and irregular appearing tissue on the left hemitrigone with hypervascularity noted on cystoscopy. 8.  Discuss nutrition. Patient encouraged to employ strategies that were discussed with nutritionist today. Encouraged increased protein and  calorie intake.  9.  Schedule for chemotherapy class (Taxotere).  10.  Discuss port-a-cath placement. 11.  RTC in 1 week for MD assessment, labs (CBC with diff, CMP), and cycle #1 Taxotere (new).    Lequita Asal, MD  02/17/2018, 2:48 PM  I saw and evaluated the patient, participating in the key portions of the service and reviewing pertinent diagnostic studies and records.  I reviewed the nurse practitioner's note and agree with the findings and the plan.  The assessment and plan were discussed with the patient.  Multiple questions were asked by the patient and answered.   Nolon Stalls, MD 02/17/2018,2:48 PM

## 2018-02-19 ENCOUNTER — Encounter: Payer: Self-pay | Admitting: Urology

## 2018-02-19 ENCOUNTER — Encounter: Payer: Self-pay | Admitting: Hematology and Oncology

## 2018-02-19 ENCOUNTER — Inpatient Hospital Stay: Payer: Medicare Other | Admitting: Hematology and Oncology

## 2018-02-21 ENCOUNTER — Encounter: Payer: Self-pay | Admitting: Hematology and Oncology

## 2018-02-24 NOTE — Progress Notes (Signed)
[facility] day:  August 14, 202019   Chief Complaint: Michael Small is a 62 y.o. male with metastatic prostate cancer who is seen for 8 month reassessment.  HPI:   The patient was last seen in the medical oncology clinic on 06/29/2017.  At that time, he felt fatigued.  He continued to have loose stools, but less often on Lomotil.  He had mild nausea. He had lost 35-45 pounds in the past 6 months.  He was afraid to eat.   At last visit, we discussed treatment for metastatic prostate cancer.  We discussed Taxotere. Dr. John Giovanni was contacted regarding thickening of bladder noted on imaging and irregular appearing tissue on the left hemitrigone with hypervascularity noted on cystoscopy.  He was presented at tumor board.  The etiology of his diarrhea was felt possibly due to bowel obstruction.  Consideration was made for carcinoid testing (24 hour urine for 5HIAA and serum chromagranin A).  Chest CT suggested a right perihilar mass.  Biopsy was not suggested.  GI evaluation was pending.  Consideration was made for a diverting colostomy if obstruction was imminent.    He was lost to follow-up.   He was admitted to [facility] from 07/28/2017 - 07/31/2017 with sepsis secondary to UTI with MSSA bacteremia.  TEE was negative.  A PICC line was placed on 08/02/2017 for 2 weeks of antibiotics.  Discussions were held regarding bilateral nephrostomy tube removal.  He wanted Hospice arranged at home.  Patient had hospice services for a short period of time. He restarted Lupron with urology, which meant that he was actively pursuing aggressive treatment. This in turn lead to the revocation of his hospice certification.   On 11/21/2017 he underwent cystoscopy with bilateral stent removal, bilateral retrograde pyelography, and exchange of ureteral stent bilaterally.  He was seen by Dr. Bernardo Heater on 01/23/2018.  Symptomatically, he was feeling better.  He denied bone pain.  He  denied voiding issues.  Three month Lupron injection was given on 01/02/2018.  Discussions were held regarding enzalutamide/apalutamide.  Stent exchange is planned for late 02/2018 or early 03/2018.   PSA has been followed: Lab Results  Component Value Date   PSA1 342.0 (H) 01/23/2018   PSA1 241.0 (H) 09/07/2017   PSA1 3,603.0 (H) 06/23/2017    Symptomatically, has is fatigued. He has "no energy at all". He spends the majority of his day resting. Patient retains the ability to independently complete all of his ADLs independently. He has slight exertional dyspnea. Patient complains of sacral pain that is exacerbated by sitting in non-cushioned seats. Patient has memory foam to all chairs and on his bed at home due to pain associated with sitting/laying on bony prominences.   Patient has a cystoscopy with nephrostomy tube exchange scheduled for 03/20/2018. Patient has pre-procedural echocardiogram scheduled.   Patient denies that he has experienced any fevers or sweats. He denies any interval infections. Patient advises that he maintains an poor appetite. He is not eating well. Weight today is 125 lb 4 oz (56.8 kg), which compared to his last visit to the clinic, represents a 24 pound decrease.      Past Medical History:  Diagnosis Date  . Chronic kidney disease   . Depression   . GERD (gastroesophageal reflux disease)   . Hyperlipidemia   . Hypertension   . Myocardial infarction [facility])    s/p stent  . Neuromuscular disorder (HCC)    numbness in arms and legs  .  Prostate cancer [facility])     Past Surgical History:  Procedure Laterality Date  . CORONARY ANGIOPLASTY WITH STENT PLACEMENT  12/2011   [facility]  . CYSTOSCOPY W/ RETROGRADES Bilateral 05/24/2017   Procedure: CYSTOSCOPY WITH RETROGRADE PYELOGRAM;  Surgeon: [name], MD;  Location: [facility] ORS;  Service: Urology;  Laterality: Bilateral;  . CYSTOSCOPY W/ RETROGRADES Bilateral 11/21/2017   Procedure: CYSTOSCOPY WITH  RETROGRADE PYELOGRAM;  Surgeon: [name], MD;  Location: [facility] ORS;  Service: Urology;  Laterality: Bilateral;  . CYSTOSCOPY W/ URETERAL STENT PLACEMENT Bilateral 11/21/2017   Procedure: CYSTOSCOPY WITH STENT REPLACEMENT;  Surgeon: [name], MD;  Location: [facility] ORS;  Service: Urology;  Laterality: Bilateral;  . CYSTOSCOPY W/ URETERAL STENT REMOVAL Bilateral 11/21/2017   Procedure: CYSTOSCOPY WITH STENT REMOVAL;  Surgeon: [name], MD;  Location: [facility] ORS;  Service: Urology;  Laterality: Bilateral;  . CYSTOSCOPY WITH STENT PLACEMENT Right 05/24/2017   Procedure: CYSTOSCOPY WITH STENT PLACEMENT;  Surgeon: [name], MD;  Location: [facility] ORS;  Service: Urology;  Laterality: Right;  . IR NEPHROSTOGRAM LEFT THRU EXISTING ACCESS  07/21/2017  . IR NEPHROSTOGRAM LEFT THRU EXISTING ACCESS  08/18/2017  . IR NEPHROSTOGRAM RIGHT THRU EXISTING ACCESS  08/18/2017  . IR NEPHROSTOMY PLACEMENT LEFT  06/15/2017  . IR NEPHROSTOMY PLACEMENT RIGHT  06/14/2017  . IR URETERAL STENT PLACEMENT EXISTING ACCESS LEFT  07/21/2017  . PICC LINE INSERTION N/A 08/02/2017   Procedure: PICC LINE INSERTION;  Surgeon: Katha Cabal, MD;  Location: [place] CV LAB;  Service: Cardiovascular;  Laterality: N/A;  . PROSTATE BIOPSY N/A 05/24/2017   Procedure: PROSTATE BIOPSY;  Surgeon: [name], MD;  Location: [facility] ORS;  Service: Urology;  Laterality: N/A;  . TEE WITHOUT CARDIOVERSION N/A 07/31/2017   Procedure: TRANSESOPHAGEAL ECHOCARDIOGRAM (TEE);  Surgeon: Teodoro Spray, MD;  Location: [facility] ORS;  Service: Cardiovascular;  Laterality: N/A;  . US ECHOCARDIOGRAPHY  12/2011   Mild LV dysfunction, EF=45%    Family History  Problem Relation Age of Onset  . Cancer Mother   . Hypertension Mother   . Diabetes Father   . Heart disease Father   . Cancer Paternal Grandfather   . Prostate cancer Neg Hx   . Bladder Cancer Neg Hx   . Kidney cancer Neg Hx     Social History:  reports that he has been  smoking cigars. He has a 48.00 pack-year smoking history. He has never used smokeless tobacco. He reports that he does not drink alcohol or use drugs.  He has smoked 1 pack per day since age 64 (72 years).  He smokes small cigars.  He does not drink alcohol, but previously drank heavily.  He describes being exposed to Round Up and asbestos.  He has been disabled for several years secondary to "nerve damage and problems in his legs, arms, and bad back".  He fell off a ladder.  He previously worked for a [employer], [employer].  He lives in [place].  He has step children.  The patient is accompanied by his wife today.  Allergies: No Known Allergies  Current Medications: Current Outpatient Medications  Medication Sig Dispense Refill  . diphenoxylate-atropine (LOMOTIL) 2.5-0.025 MG tablet Take 1 tablet by mouth 4 (four) times daily as needed for diarrhea or loose stools. 120 tablet 3  . feeding supplement, ENSURE ENLIVE, (ENSURE ENLIVE) LIQD Take 237 mLs by mouth 3 (three) times daily between meals. 237 mL 12  . furosemide (LASIX) 20  MG tablet Take 1 tablet (20 mg total) by mouth daily as needed for fluid. 30 tablet 2  . HYDROcodone-acetaminophen (NORCO) 10-325 MG tablet Take 1 tablet by mouth every 3 (three) hours as needed. For cancer related pain Dx: C61 120 tablet 0  . ondansetron (ZOFRAN) 8 MG tablet TAKE 1 TABLET BY MOUTH EVERY 8 HOURS AS NEEDED FOR NAUSEA 30 tablet 5  . Oxycodone HCl 10 MG TABS One tablet every six hours scheduled, and one every three hours as needed for breakthrough pain 120 tablet 0   No current facility-administered medications for this visit.     Review of Systems  Constitutional: Positive for malaise/fatigue (marked) and weight loss (down 24 pounds). Negative for diaphoresis and fever.  HENT: Negative.   Eyes: Negative.   Respiratory: Positive for shortness of breath (exertional). Negative for cough, hemoptysis and sputum production.   Cardiovascular: Negative for  chest pain, palpitations, orthopnea, leg swelling and PND.       PMH (+) for MI  Gastrointestinal: Negative for abdominal pain, blood in stool, constipation, diarrhea, melena, nausea and vomiting.  Genitourinary: Negative for dysuria, frequency, hematuria and urgency.       Nephrostomy tubes; exchange due 03/20/2018.  Musculoskeletal: Negative for back pain, falls, joint pain and myalgias.       Sacral pain from sitting; no subcutaneous fat   Skin: Negative for itching and rash.  Neurological: Negative for dizziness, tremors, weakness and headaches.  Endo/Heme/Allergies: Bruises/bleeds easily.  Psychiatric/Behavioral: Negative for depression, memory loss and suicidal ideas. The patient is not nervous/anxious and does not have insomnia.   All other systems reviewed and are negative.  Performance status (ECOG): 2 - 3  Vital Signs: BP (!) 171/78 (BP Location: Left Arm, Patient Position: Sitting)   Pulse 80   Temp (!) 96.1 F (35.6 C) (Tympanic)   Resp 18   Wt 125 lb 4 oz (56.8 kg)   BMI 17.97 kg/m   Physical Exam  Constitutional: He is oriented to person, place, and time. He appears malnourished. He appears unhealthy. He appears cachectic.  HENT:  Head: Normocephalic and atraumatic.  Alopecia.  Scruffy beard. Temporal wasting.  Edentulous.  Eyes: Pupils are equal, round, and reactive to light. EOM are normal. No scleral icterus.  Blue eyes  Neck: Normal range of motion. Neck supple. No tracheal deviation present. No thyromegaly present.  Cardiovascular: Normal rate, regular rhythm and normal heart sounds. Exam reveals no gallop and no friction rub.  No murmur heard. Pulmonary/Chest: Effort normal and breath sounds normal. No respiratory distress. He has no wheezes. He has no rales.  Prominent ribs.  Abdominal: Soft. Bowel sounds are normal. He exhibits no distension. There is no tenderness.  Musculoskeletal: Normal range of motion. He exhibits edema. He exhibits no tenderness.   Bilateral 1+ lower extremity edema.  Lymphadenopathy:    He has no cervical adenopathy.    He has no axillary adenopathy.       Right: No inguinal and no supraclavicular adenopathy present.       Left: No inguinal and no supraclavicular adenopathy present.  Neurological: He is alert and oriented to person, place, and time.  Skin: Skin is warm and dry. No rash noted. No erythema.  Excoriations.  Psychiatric: Mood, affect and judgment normal.  Nursing note and vitals reviewed.   No visits with results within 3 Day(s) from this visit.  Latest known visit with results is:  Office Visit on 01/23/2018  Component Date Value Ref Range Status  .  Prostate Specific Ag, Serum 01/23/2018 342.0* 0.0 - 4.0 ng/mL Final   Comment: Results confirmed on dilution. **Verified by repeat analysis** Roche ECLIA methodology. According to the American Urological Association, Serum PSA should decrease and remain at undetectable levels after radical prostatectomy. The AUA defines biochemical recurrence as an initial PSA value 0.2 ng/mL or greater followed by a subsequent confirmatory PSA value 0.2 ng/mL or greater. Values obtained with different assay methods or kits cannot be used interchangeably. Results cannot be interpreted as absolute evidence of the presence or absence of malignant disease.     Assessment:  Michael Small is a 62 y.o. male with metastatic prostate cancer s/p transrectal prostate biopsies and cystoscopy on 05/24/2017.  Prostate biopsies revealed acinar adenocarcinoma involving all 12 cores.  Adenocarcinoma involved colonic mucosa and skeletal muscle.  Gleason score was 9 (4+5), grade group 5.  Pathologic stage was T4N1M1c.  PSA was 2338.8 on 04/24/2017.  Abdomen and pelvic CT on 04/27/2017 revealed prominent pelvic adenopathy with pathologic retroperitoneal adenopathy, abnormal retroperitoneal stranding, and abnormal inflammatory type stranding along the pelvic sidewalls and perirectal  space.  A representative right external iliac lymph node measured 3.2 cm.  A left external iliac lymph node measured 2.1 cm.  There was conglomerate left periaortic adenopathy 2.1 cm and a 1.4 cm aortocaval node.   There was abnormal adenopathy in the perirectal space with prominent wall thickening in the rectum and moderate wall thickening in the sigmoid colon. The findings along the pelvic sidewalls appeared to be causing distal ureteral obstruction resulting in moderate bilateral hydronephrosis and hydroureter.  Lymphoma or prostate cancer was favored. A right external iliac node measured 3.2 cm.  There was bladder wall thickening suggesting cystitis.  There was prostatomegaly.  Bone scan on 06/22/2017 revealed findings consistent with diffuse metastatic disease.  There was activity in right temporal area, sternum, anterior and posterior ribs bilaterally, and entire spine.  Non-contrast chest CT on 06/27/2017 revealed right infrahilar prominence, progressed from prior CT, worrisome for a node/nodule, but poorly visualized given the lack of IV contrast.  There was mid thoracic lymphadenopathy, progressed, including a dominant 14 mm short axis node at the left thoracic inlet.  There was stable 19 x 9 mm irregular nodule at the right lung apex.  There was a sclerotic osseous metastases at T8-9.  He received Lupron 22.5 mg (06/01/2017, 10/02/2017, and 01/02/2018) and Degarelix 240 mg on 06/15/2017.  He received Casodex from 06/13/2017 - 06/15/2017.  Testosterone was 98 on 06/14/2017 and < 3 on 09/07/2017.  PSA has been followed:  2338.8 on 04/24/2017, 3603.0 on 06/23/2017, 1035 on 07/26/2017, 241 on 09/07/2017, and 342 on 01/23/2018.  He was admitted to [facility] from 06/12/2017 - 06/15/2017 with hyperkalemia and acute renal failure.  Creatinine was 7.23.  He underwent emergent dialysis x1. He is s/p bilateral external nephrostomy tube placement on 06/14/2017.  He was admitted to [facility] from 07/28/2017 -  07/31/2017 with sepsis secondary to UTI with MSSA bacteremia.    Bilateral lower extremity duplex on 06/12/2017 revealed no evidence of DVT.   Code status is DNR/DNI.  Symptomatically, he is markedly fatigued. He spends the majority of his days resting. Patient continues to lost weight.  He has lost another 24 pounds. He denies significant symptoms associated with this underlying metastatic disease. Complains of some pain in the sacral area due to depletion of subcutaneous fat resulting in him sitting on a bony prominences.  Exam reveals a thin, frail, and chronically ill appearing gentleman.  Plan: 1. Labs today:  CBC with diff, CMP, PSA, testosterone. 2. Metastatic prostate cancer:  Patient currently on Lupron every 3 months (last 01/02/2018). Wishes to continue here. Will preauthorize drug.   Check PSA today.   Patient previously on Casodex (last 06/15/2017).  Discuss patient's thoughts about chemotherapy (Taxotere).  Patient's performance status is extremely poor.  Discuss treatment options using hormonal therapy. Considering abiraterone + prednisone or enzalutamide.  Determine best option (abiraterone versus enzalutamide) given known renal insufficiency.  Discuss need for new baseline imaging. Will schedule CT of the abdomen and pelvis without contrast and a bone scan.  3. Bone metastasis:  Discuss consideration of Xgeva. If creatine signifocantly elevated (CrCl < 30 ml/min), will not pursue Xgeva.  Labs available after his appointment today revealed a Cr 3.36.   Patient at risk for denosumab induced HYPOcalcemia, as his CrCl is 18.1 mL/min.  4. Goals of care  Discuss code status. Patient confirms DNR/DNI status. Documentation in place.  5. Smoking cessation  Patient continues to smoke flavored cigars.   No intentions of quitting at this point. States, "I gotta die somehow. Might as well enjoy something while I am going".  6. RTC after scans for MD assessment and review of  imaging and further direction regarding therapy.   A total of (27) minutes of face-to-face time was spent with the patient with greater than 50% of that time in counseling and care-coordination.   Addendum:  Abiraterone best option for patient.  Spoke with Nuala Alpha, oral chemotherapy pharmacist.  Patient to receive Zytiga and prednisone on 02/28/2018.   Honor Loh, NP  03-10-202019, 4:30 PM  I saw and evaluated the patient, participating in the key portions of the service and reviewing pertinent diagnostic studies and records.  I reviewed the nurse practitioner's note and agree with the findings and the plan.  The assessment and plan were discussed with the patient.  Multiple questions were asked by the patient and answered.   Nolon Stalls, MD 03-10-202019,4:30 PM

## 2018-02-26 ENCOUNTER — Inpatient Hospital Stay: Payer: Medicare Other

## 2018-02-26 ENCOUNTER — Inpatient Hospital Stay: Payer: Medicare Other | Attending: Hematology and Oncology | Admitting: Hematology and Oncology

## 2018-02-26 VITALS — BP 171/78 | HR 80 | Temp 96.1°F | Resp 18 | Wt 125.2 lb

## 2018-02-26 DIAGNOSIS — R197 Diarrhea, unspecified: Secondary | ICD-10-CM

## 2018-02-26 DIAGNOSIS — C61 Malignant neoplasm of prostate: Secondary | ICD-10-CM | POA: Insufficient documentation

## 2018-02-26 DIAGNOSIS — R5383 Other fatigue: Secondary | ICD-10-CM

## 2018-02-26 DIAGNOSIS — Z7189 Other specified counseling: Secondary | ICD-10-CM

## 2018-02-26 DIAGNOSIS — R110 Nausea: Secondary | ICD-10-CM

## 2018-02-26 DIAGNOSIS — N189 Chronic kidney disease, unspecified: Secondary | ICD-10-CM | POA: Diagnosis not present

## 2018-02-26 DIAGNOSIS — F17290 Nicotine dependence, other tobacco product, uncomplicated: Secondary | ICD-10-CM | POA: Diagnosis not present

## 2018-02-26 DIAGNOSIS — F329 Major depressive disorder, single episode, unspecified: Secondary | ICD-10-CM | POA: Insufficient documentation

## 2018-02-26 DIAGNOSIS — C7951 Secondary malignant neoplasm of bone: Secondary | ICD-10-CM

## 2018-02-26 DIAGNOSIS — I129 Hypertensive chronic kidney disease with stage 1 through stage 4 chronic kidney disease, or unspecified chronic kidney disease: Secondary | ICD-10-CM | POA: Insufficient documentation

## 2018-02-26 LAB — COMPREHENSIVE METABOLIC PANEL
ALK PHOS: 203 U/L — AB (ref 38–126)
ALT: 20 U/L (ref 0–44)
AST: 25 U/L (ref 15–41)
Albumin: 3.7 g/dL (ref 3.5–5.0)
Anion gap: 15 (ref 5–15)
BILIRUBIN TOTAL: 1.2 mg/dL (ref 0.3–1.2)
BUN: 89 mg/dL — ABNORMAL HIGH (ref 8–23)
CALCIUM: 9.1 mg/dL (ref 8.9–10.3)
CO2: 22 mmol/L (ref 22–32)
CREATININE: 3.36 mg/dL — AB (ref 0.61–1.24)
Chloride: 102 mmol/L (ref 98–111)
GFR calc non Af Amer: 18 mL/min — ABNORMAL LOW (ref >60)
GFR, EST AFRICAN AMERICAN: 21 mL/min — AB (ref >60)
Glucose, Bld: 81 mg/dL (ref 70–99)
Potassium: 4.8 mmol/L (ref 3.5–5.1)
Sodium: 139 mmol/L (ref 135–145)
Total Protein: 7.2 g/dL (ref 6.5–8.1)

## 2018-02-26 LAB — CBC WITH DIFFERENTIAL/PLATELET
BASOS PCT: 1 %
Basophils Absolute: 0.1 10*3/uL (ref 0–0.1)
EOS ABS: 0.1 10*3/uL (ref 0–0.7)
Eosinophils Relative: 2 %
HEMATOCRIT: 36.7 % — AB (ref 40.0–52.0)
Hemoglobin: 12.9 g/dL — ABNORMAL LOW (ref 13.0–18.0)
Lymphocytes Relative: 29 %
Lymphs Abs: 2 10*3/uL (ref 1.0–3.6)
MCH: 31.8 pg (ref 26.0–34.0)
MCHC: 35.3 g/dL (ref 32.0–36.0)
MCV: 90.3 fL (ref 80.0–100.0)
MONO ABS: 0.5 10*3/uL (ref 0.2–1.0)
Monocytes Relative: 7 %
NEUTROS ABS: 4.3 10*3/uL (ref 1.4–6.5)
Neutrophils Relative %: 61 %
Platelets: 242 10*3/uL (ref 150–440)
RBC: 4.06 MIL/uL — ABNORMAL LOW (ref 4.40–5.90)
RDW: 18.3 % — AB (ref 11.5–14.5)
WBC: 7 10*3/uL (ref 3.8–10.6)

## 2018-02-26 NOTE — Progress Notes (Signed)
Patient referred back from Dr. Bernardo Heater regarding prostate cancer.  Patient accompanied by his wife, Diane today.  Patient states he has questions for Dr. Mike Gip.  Made it plain that he will not have chemotherapy treatment.

## 2018-02-27 ENCOUNTER — Encounter: Payer: Self-pay | Admitting: Hematology and Oncology

## 2018-02-27 ENCOUNTER — Telehealth: Payer: Self-pay | Admitting: Pharmacy Technician

## 2018-02-27 ENCOUNTER — Telehealth: Payer: Self-pay | Admitting: Pharmacist

## 2018-02-27 DIAGNOSIS — C61 Malignant neoplasm of prostate: Secondary | ICD-10-CM

## 2018-02-27 LAB — PSA: PROSTATIC SPECIFIC ANTIGEN: 799 ng/mL — AB (ref 0.00–4.00)

## 2018-02-27 MED ORDER — PREDNISONE 5 MG PO TABS: 5.0000 mg | ORAL_TABLET | Freq: Two times a day (BID) | ORAL | 2 refills | Status: DC

## 2018-02-27 MED ORDER — ABIRATERONE ACETATE 250 MG PO TABS: 1000.0000 mg | ORAL_TABLET | Freq: Every day | ORAL | 0 refills | Status: DC

## 2018-02-27 MED FILL — ABIRATERONE ACETATE 250 MG: 250 | 30 days supply | Qty: 120 | Fill #0

## 2018-02-27 MED FILL — predniSONE 5 MG TABS: 5 | 30 days supply | Qty: 60 | Fill #0

## 2018-02-27 NOTE — Telephone Encounter (Signed)
Oral Oncology Patient Advocate Encounter  I spoke with Parke Simmers, patients wife, this afternoon to set up delivery of Zytiga (Abiraterone).  Address verified for shipment.  Zytiga and Prednisone will be filled through [facility] and mailed 10/1 for delivery 10/2.    [place] will call 1 week before next refill is due to set up delivery of medication.  Parke Simmers was familiar with this process and had no questions.  [place] Patient [place] Phone [phone] Fax [phone] 02/27/2018 2:03 PM

## 2018-02-27 NOTE — Telephone Encounter (Signed)
Oral Oncology Patient Advocate Encounter   Was successful in securing patient an $6 grant from Patient [facility]) to provide copayment coverage for Zytiga.  This will keep the out of pocket expense at $0.     I have spoken with the patient.    The billing information is as follows and has been shared with [place].   Member ID: [id number] Group ID: [id number] RxBin: [id number] Dates of Eligibility: 11/29/17 through 2019/03/04  [place] Patient [place] Phone [phone] Fax [phone] 02/27/2018 12:32 PM

## 2018-02-27 NOTE — Telephone Encounter (Addendum)
Oral Chemotherapy Pharmacist Encounter  Zytiga/prednisone will delivered to Mr. Glinski tomorrow 02/28/18. Spoke with Dr. Mike Gip and she would like for the patient to get started on the Zytiga as soon as they received the medication. I informed Mrs. Kelleher and she stated her understanding.   Patient Education I spoke with patient's wife for overview of new oral chemotherapy medication: Zytiga (abiraterone) for the treatment of metastatic castrate resistant prostate cancer in conjunction with prednisone and Lupron, planned duration until disease progression or unacceptable drug toxicity.  Counseled patient on administration, dosing, side effects, monitoring, drug-food interactions, safe handling, storage, and disposal. Patient will take abiraterone 4 tablets (1,000 mg total) by mouth daily. Take on an empty stomach 1 hour before or 2 hours after a meal. He will also take prednisone 1 tablet (5 mg total) by mouth 2 (two) times daily with a meal.  Side effects include but not limited to: decreased wbc, HTN, liver damage, fatigue, arthralgia, N/V/D, hot flush, and headache   Reviewed with patient importance of keeping a medication schedule and plan for any missed doses.  Mr. Brakebill voiced understanding and appreciation. All questions answered. Medication handout place in the mail.   Provided patient with Oral [place] [facility] phone number. Patient knows to call the office with questions or concerns. Oral Chemotherapy Navigation Clinic will continue to follow.  Darl Pikes, PharmD, BCPS, [facility] Hematology/Oncology Clinical Pharmacist [facility] [place] [facility] [phone]  02/27/2018 2:57 PM

## 2018-02-27 NOTE — Progress Notes (Signed)
DISCONTINUE ON PATHWAY REGIMEN - Prostate   Docetaxel 75 mg/m2:   A cycle is every 21 days:     Docetaxel   **Always confirm dose/schedule in your [facility] + Bicalutamide:   A cycle is every 12 weeks:     Leuprolide acetate depot    Daily:     Bicalutamide   **Always confirm dose/schedule in your pharmacy ordering system**  REASON: Change In Patient Status PRIOR TREATMENT: POS77: Docetaxel 75 mg/m2 q21 Days Without Prednisone x 6 Cycles with Medical Castration  START ON PATHWAY REGIMEN - Prostate     Daily:     Abiraterone acetate      Prednisone   **Always confirm dose/schedule in your pharmacy ordering system**  Patient Characteristics: Adenocarcinoma, Metastatic, Castration Resistant, Symptomatic, Prior Docetaxel/Docetaxel Ineligible Current radiographic evidence of distant metastasis<= Yes Histology: Adenocarcinoma AJCC T Category: cTX Gleason Primary: X AJCC N Category: N1 Gleason Secondary: X AJCC M Category: M1a Gleason Score: X AJCC 8 Stage Grouping: IVB PSA Values (ng/mL): X  Intent of Therapy: Non-Curative / Palliative Intent, Discussed with Patient

## 2018-02-27 NOTE — Telephone Encounter (Signed)
Oral Oncology Pharmacist Encounter  Received new prescription for Zytiga (abiraterone) for the treatment of metastatic castrate resistant prostate cancer in conjunction with prednisone, planned duration until disease progression or unacceptable drug toxicity.  BP/CMP from 02/26/18 assessed, no relevant lab abnormalities. BP elevated continue to monitor BP, may need to add on medication for better BP control. Prescription dose and frequency assessed.   Current medication list in Epic reviewed, no relevant DDIs with Zytiga identified:  Prescription has been e-scribed to the [employer] for benefits analysis and approval.  Oral Oncology Clinic will continue to follow for insurance authorization, copayment issues, initial counseling and start date.  Darl Pikes, PharmD, BCPS, [facility] Hematology/Oncology Clinical Pharmacist [facility] [place] [facility] [phone]  02/27/2018 12:31 PM

## 2018-02-28 ENCOUNTER — Telehealth: Payer: Self-pay | Admitting: Family Medicine

## 2018-02-28 MED ORDER — BUPROPION HCL ER (SR) 150 MG PO TB12: ORAL_TABLET | ORAL | 3 refills | Status: DC

## 2018-02-28 NOTE — Telephone Encounter (Signed)
Please advise 

## 2018-02-28 NOTE — Telephone Encounter (Signed)
Prescription sent to [facility].

## 2018-02-28 NOTE — Telephone Encounter (Signed)
Pt's wife called wanting to know if Dr. Caryn Section will re prescribe the Wellbutrin again.  He is having a lot of anxiety and he use to take this.  Wife says he was in a bout two weeks ago.  CB#  [phone]  [facility] uses [facility]  [address]  Thanks teri

## 2018-03-01 NOTE — Telephone Encounter (Signed)
Spoke to patients wife today 03/01/18 and they received medication 02/28/18.

## 2018-03-09 ENCOUNTER — Ambulatory Visit
Admission: RE | Admit: 2018-03-09 | Discharge: 2018-03-09 | Disposition: A | Discharge status: Home / Self Care | Payer: Medicare Other | Source: Ambulatory Visit | Attending: Urgent Care | Admitting: Urgent Care

## 2018-03-09 ENCOUNTER — Encounter
Admission: RE | Admit: 2018-03-09 | Discharge: 2018-03-09 | Disposition: A | Discharge status: Home / Self Care | Payer: Medicare Other | Source: Ambulatory Visit | Attending: Urgent Care | Admitting: Urgent Care

## 2018-03-09 DIAGNOSIS — C7951 Secondary malignant neoplasm of bone: Secondary | ICD-10-CM | POA: Diagnosis not present

## 2018-03-09 DIAGNOSIS — C61 Malignant neoplasm of prostate: Secondary | ICD-10-CM | POA: Insufficient documentation

## 2018-03-09 MED ORDER — TECHNETIUM TC 99M MEDRONATE IV KIT
20.0000 | PACK | Freq: Once | INTRAVENOUS | Status: AC | PRN
  Administered 2018-03-09: 21.3 via INTRAVENOUS

## 2018-03-13 ENCOUNTER — Encounter
Admission: RE | Admit: 2018-03-13 | Discharge: 2018-03-13 | Disposition: A | Discharge status: Home / Self Care | Payer: Medicare Other | Source: Ambulatory Visit | Attending: Urology | Admitting: Urology

## 2018-03-13 ENCOUNTER — Other Ambulatory Visit: Payer: Self-pay

## 2018-03-13 DIAGNOSIS — R591 Generalized enlarged lymph nodes: Secondary | ICD-10-CM | POA: Diagnosis not present

## 2018-03-13 DIAGNOSIS — N1330 Unspecified hydronephrosis: Secondary | ICD-10-CM | POA: Diagnosis not present

## 2018-03-13 DIAGNOSIS — I10 Essential (primary) hypertension: Secondary | ICD-10-CM | POA: Diagnosis not present

## 2018-03-13 DIAGNOSIS — R9431 Abnormal electrocardiogram [ECG] [EKG]: Secondary | ICD-10-CM | POA: Diagnosis not present

## 2018-03-13 DIAGNOSIS — N261 Atrophy of kidney (terminal): Secondary | ICD-10-CM | POA: Diagnosis not present

## 2018-03-13 DIAGNOSIS — C7951 Secondary malignant neoplasm of bone: Secondary | ICD-10-CM | POA: Diagnosis not present

## 2018-03-13 DIAGNOSIS — C61 Malignant neoplasm of prostate: Secondary | ICD-10-CM | POA: Diagnosis not present

## 2018-03-13 NOTE — Pre-Procedure Instructions (Signed)
EKG COMPARED WITH 2018

## 2018-03-13 NOTE — Patient Instructions (Signed)
Your procedure is scheduled on: Tuesday 03/20/18  Report to [place]. To find out your arrival time please call [phone] between 1PM - 3PM on Monday 03/19/18.  Remember: Instructions that are not followed completely may result in serious medical risk, up to and including death, or upon the discretion of your surgeon and anesthesiologist your surgery may need to be rescheduled.     _X__ 1. Do not eat food after midnight the night before your procedure.                 No gum chewing or hard candies. You may drink clear liquids up to 2 hours                 before you are scheduled to arrive for your surgery- DO NOT drink clear                 liquids within 2 hours of the start of your surgery.                 Clear Liquids include:  water, apple juice without pulp, clear carbohydrate                 drink such as Clearfast or Gatorade, Black Coffee or Tea (Do not add                 anything to coffee or tea).  __X__2.  On the morning of surgery brush your teeth with toothpaste and water, you may rinse your mouth with mouthwash if you wish.  Do not swallow any toothpaste or mouthwash.     _X__ 3.  No Alcohol for 24 hours before or after surgery.   _X__ 4.  Do Not Smoke or use e-cigarettes For 24 Hours Prior to Your Surgery.                 Do not use any chewable tobacco products for at least 6 hours prior to                 surgery.  ____  5.  Bring all medications with you on the day of surgery if instructed.   __X__  6.  Notify your doctor if there is any change in your medical condition      (cold, fever, infections).     Do not wear jewelry, make-up, hairpins, clips or nail polish. Do not wear lotions, powders, or perfumes.  Do not shave 48 hours prior to surgery. Men may shave face and neck. Do not bring valuables to the hospital.    [facility] is not responsible for any belongings or valuables.  Contacts,  dentures/partials or body piercings may not be worn into surgery. Bring a case for your contacts, glasses or hearing aids, a denture cup will be supplied. Leave your suitcase in the car. After surgery it may be brought to your room. For patients admitted to the hospital, discharge time is determined by your treatment team.   Patients discharged the day of surgery will not be allowed to drive home.   Please read over the following fact sheets that you were given:   MRSA Information  __X__ Take these medicines the morning of surgery with A SIP OF WATER:     1. buPROPion (WELLBUTRIN SR) 150 MG 12 hr tablet  2. diphenoxylate-atropine (LOMOTIL) 2.5-0.025 MG tablet if needed.  3. HYDROcodone-acetaminophen (NORCO) 10-325 MG tablet  4.  ondansetron (ZOFRAN) 8 MG tablet if needed  5. Oxycodone HCl 10 MG TABS if needed for breakthrough pain.  6.   __X__ Stop Anti-inflammatories 7 days before surgery such as Advil, Ibuprofen, Motrin, BC or Goodies Powder, Naprosyn, Naproxen, Aleve, Aspirin, Meloxicam. May take Tylenol if needed for pain or discomfort.   __X__ Stop all herbal supplements until after surgery.

## 2018-03-14 ENCOUNTER — Ambulatory Visit
Admission: RE | Admit: 2018-03-14 | Discharge: 2018-03-14 | Disposition: A | Discharge status: Home / Self Care | Payer: Medicare Other | Source: Ambulatory Visit | Attending: Urgent Care | Admitting: Urgent Care

## 2018-03-14 DIAGNOSIS — C7951 Secondary malignant neoplasm of bone: Secondary | ICD-10-CM | POA: Diagnosis not present

## 2018-03-14 DIAGNOSIS — R591 Generalized enlarged lymph nodes: Secondary | ICD-10-CM | POA: Insufficient documentation

## 2018-03-14 DIAGNOSIS — N261 Atrophy of kidney (terminal): Secondary | ICD-10-CM | POA: Insufficient documentation

## 2018-03-14 DIAGNOSIS — C61 Malignant neoplasm of prostate: Secondary | ICD-10-CM | POA: Insufficient documentation

## 2018-03-14 DIAGNOSIS — R9431 Abnormal electrocardiogram [ECG] [EKG]: Secondary | ICD-10-CM | POA: Insufficient documentation

## 2018-03-14 DIAGNOSIS — I10 Essential (primary) hypertension: Secondary | ICD-10-CM | POA: Diagnosis not present

## 2018-03-14 DIAGNOSIS — N1330 Unspecified hydronephrosis: Secondary | ICD-10-CM | POA: Diagnosis not present

## 2018-03-14 LAB — URINE CULTURE: CULTURE: NO GROWTH

## 2018-03-16 ENCOUNTER — Inpatient Hospital Stay: Payer: Medicare Other

## 2018-03-16 ENCOUNTER — Encounter: Payer: Self-pay | Admitting: Hematology and Oncology

## 2018-03-16 ENCOUNTER — Inpatient Hospital Stay: Payer: Medicare Other | Attending: Hematology and Oncology | Admitting: Hematology and Oncology

## 2018-03-16 ENCOUNTER — Other Ambulatory Visit: Payer: Self-pay | Admitting: Family Medicine

## 2018-03-16 VITALS — BP 184/80 | HR 80 | Temp 96.4°F | Resp 18 | Wt 130.5 lb

## 2018-03-16 DIAGNOSIS — I129 Hypertensive chronic kidney disease with stage 1 through stage 4 chronic kidney disease, or unspecified chronic kidney disease: Secondary | ICD-10-CM

## 2018-03-16 DIAGNOSIS — Z79899 Other long term (current) drug therapy: Secondary | ICD-10-CM | POA: Diagnosis not present

## 2018-03-16 DIAGNOSIS — C61 Malignant neoplasm of prostate: Secondary | ICD-10-CM | POA: Diagnosis not present

## 2018-03-16 DIAGNOSIS — C7951 Secondary malignant neoplasm of bone: Secondary | ICD-10-CM

## 2018-03-16 DIAGNOSIS — F329 Major depressive disorder, single episode, unspecified: Secondary | ICD-10-CM | POA: Insufficient documentation

## 2018-03-16 DIAGNOSIS — R110 Nausea: Secondary | ICD-10-CM | POA: Diagnosis not present

## 2018-03-16 DIAGNOSIS — F17290 Nicotine dependence, other tobacco product, uncomplicated: Secondary | ICD-10-CM | POA: Diagnosis not present

## 2018-03-16 DIAGNOSIS — Z5111 Encounter for antineoplastic chemotherapy: Secondary | ICD-10-CM | POA: Insufficient documentation

## 2018-03-16 DIAGNOSIS — Z66 Do not resuscitate: Secondary | ICD-10-CM | POA: Insufficient documentation

## 2018-03-16 DIAGNOSIS — N189 Chronic kidney disease, unspecified: Secondary | ICD-10-CM | POA: Diagnosis not present

## 2018-03-16 DIAGNOSIS — R197 Diarrhea, unspecified: Secondary | ICD-10-CM

## 2018-03-16 DIAGNOSIS — Z7189 Other specified counseling: Secondary | ICD-10-CM

## 2018-03-16 LAB — COMPREHENSIVE METABOLIC PANEL
ALT: 19 U/L (ref 0–44)
AST: 21 U/L (ref 15–41)
Albumin: 3.5 g/dL (ref 3.5–5.0)
Alkaline Phosphatase: 262 U/L — ABNORMAL HIGH (ref 38–126)
Anion gap: 8 (ref 5–15)
BUN: 27 mg/dL — ABNORMAL HIGH (ref 8–23)
CO2: 25 mmol/L (ref 22–32)
Calcium: 9.2 mg/dL (ref 8.9–10.3)
Chloride: 108 mmol/L (ref 98–111)
Creatinine, Ser: 1.98 mg/dL — ABNORMAL HIGH (ref 0.61–1.24)
GFR calc Af Amer: 40 mL/min — ABNORMAL LOW (ref >60)
GFR calc non Af Amer: 34 mL/min — ABNORMAL LOW (ref >60)
Glucose, Bld: 101 mg/dL — ABNORMAL HIGH (ref 70–99)
Potassium: 5.4 mmol/L — ABNORMAL HIGH (ref 3.5–5.1)
Sodium: 141 mmol/L (ref 135–145)
Total Bilirubin: 0.6 mg/dL (ref 0.3–1.2)
Total Protein: 6.7 g/dL (ref 6.5–8.1)

## 2018-03-16 NOTE — Progress Notes (Signed)
[facility] day:  03/16/2018   Chief Complaint: Michael Small is a 62 y.o. male with metastatic prostate cancer who is seen for assessment on abiraterone and review of interval imaging studies.  HPI:   The patient was last seen in the medical oncology clinic on 07/10/2017 for reassessment.   Symptomatically, he was markedly fatigued. He spent the majority of his days resting. He continued to lost weight.  He had lost another 24 pounds. He denied significant symptoms associated with this underlying metastatic disease. He complained of pain in the sacral area due to depletion of subcutaneous fat resulting in him sitting on a bony prominences.  Exam revealed a thin, frail, and chronically ill appearing gentleman.   Labs at last visit included a hematocrit of 36.7, hemoglobin 12.9, platelets 242,000, WBC 7000 with an ANC of 4300.  Creatinine was 3.36.  Alkaline phosphatase was 203.  Albumen was 3.7.  PSA was 799.  We discussed treatment options at last visit.  He wished to continue hormonal therapy.  Lupron was authorized (last given 01/02/2018; 3 month).  Aberaterone was requested.  He received abiraterone on 02/28/2018.   Bone scan on 03/09/2018 revealed widespread osseous metastatic disease with super scan appearance, progressive since prior exam.  Abdomen and pelvis CT on 03/14/2018 revealed interval progression of widespread sclerotic bony metastatic involvement with new areas of lucency in the axial skeleton and bony pelvis.  There was evidence of interval renal atrophy, right greater than left. There was mild persistent right hydronephrosis.  There was interval removal of percutaneous nephrostomy catheters with internal ureteral stents visualized bilaterally in situ.  There was mild lymphadenopathy in the pelvic sidewall bilaterally.  There was diffuse body wall, retroperitoneal, and mesenteric edema.  There was persistent circumferential wall thickening  in the distal rectum.  Patient has a cystoscopy with nephrostomy tube exchange scheduled for 03/20/2018. Patient has pre-procedural echocardiogram scheduled.   Symptomatically, patient having nausea, vomiting, diarrhea and extreme fatigue on the prescribed abiraterone. He started abiraterone on 03/01/2018, and self discontinued it after 2 days. Patient has been on daily antiemetics and antidiarrheals. He is "just now" starting to feel better. He notes previous issues with his bowels prior to initiation of the abiraterone.  He is currently taking Lomotil TID as needed.    Past Medical History:  Diagnosis Date  . Chronic kidney disease   . Depression   . GERD (gastroesophageal reflux disease)   . Hyperlipidemia   . Hypertension   . Myocardial infarction [facility])    s/p stent  . Neuromuscular disorder (HCC)    numbness in arms and legs  . Prostate cancer [facility])     Past Surgical History:  Procedure Laterality Date  . CORONARY ANGIOPLASTY WITH STENT PLACEMENT  12/2011   [facility]  . CYSTOSCOPY W/ RETROGRADES Bilateral 05/24/2017   Procedure: CYSTOSCOPY WITH RETROGRADE PYELOGRAM;  Surgeon: [name], MD;  Location: [facility] ORS;  Service: Urology;  Laterality: Bilateral;  . CYSTOSCOPY W/ RETROGRADES Bilateral 11/21/2017   Procedure: CYSTOSCOPY WITH RETROGRADE PYELOGRAM;  Surgeon: [name], MD;  Location: [facility] OR[facility];  Service: Urology;  Laterality: Bilateral;  . CYSTOSCOPY W/ URETERAL STENT PLACEMENT Bilateral 11/21/2017   Procedure: CYSTOSCOPY WITH STENT REPLACEMENT;  Surgeon: [name], MD;  Location: [facility] OR[facility];  Service: Urology;  Laterality: Bilateral;  . CYSTOSCOPY W/ URETERAL STENT REMOVAL Bilateral 11/21/2017   Procedure: CYSTOSCOPY WITH STENT REMOVAL;  Surgeon: [name], MD;  Location: [facility] OR[facility];  Service: Urology;  Laterality: Bilateral;  . CYSTOSCOPY WITH STENT PLACEMENT Right 05/24/2017   Procedure: CYSTOSCOPY WITH STENT PLACEMENT;  Surgeon: [name], MD;  Location: [facility] OR[facility];  Service: Urology;  Laterality: Right;  . IR NEPHROSTOGRAM LEFT THRU EXISTING ACCESS  07/21/2017  . IR NEPHROSTOGRAM LEFT THRU EXISTING ACCESS  08/18/2017  . IR NEPHROSTOGRAM RIGHT THRU EXISTING ACCESS  08/18/2017  . IR NEPHROSTOMY PLACEMENT LEFT  06/15/2017  . IR NEPHROSTOMY PLACEMENT RIGHT  06/14/2017  . IR URETERAL STENT PLACEMENT EXISTING ACCESS LEFT  07/21/2017  . PICC LINE INSERTION N/A 08/02/2017   Procedure: PICC LINE INSERTION;  Surgeon: Katha Cabal, MD;  Location: [place] [facility];  Service: Cardiovascular;  Laterality: N/A;  . PROSTATE BIOPSY N/A 05/24/2017   Procedure: PROSTATE BIOPSY;  Surgeon: [name], MD;  Location: [facility];  Service: Urology;  Laterality: N/A;  . TEE WITHOUT CARDIOVERSION N/A 07/31/2017   Procedure: TRANSESOPHAGEAL ECHOCARDIOGRAM (TEE);  Surgeon: Teodoro Spray, MD;  Location: [facility];  Service: Cardiovascular;  Laterality: N/A;  . US ECHOCARDIOGRAPHY  12/2011   Mild LV dysfunction, EF=45%    Family History  Problem Relation Age of Onset  . Cancer Mother   . Hypertension Mother   . Diabetes Father   . Heart disease Father   . Cancer Paternal Grandfather   . Prostate cancer Neg Hx   . Bladder Cancer Neg Hx   . Kidney cancer Neg Hx     Social History:  reports that he has been smoking cigars. He has a 48.00 pack-year smoking history. He has never used smokeless tobacco. He reports that he does not drink alcohol or use drugs.  He has smoked 1 pack per day since age 42 (45 years).  He smokes small cigars.  He does not drink alcohol, but previously drank heavily.  He describes being exposed to Round Up and asbestos.  He has been disabled for several years secondary to "nerve damage and problems in his legs, arms, and bad back".  He fell off a ladder.  He previously worked for a [employer], [employer].  He lives in [place].  He has step children.  The patient is accompanied by his wife today.  Allergies:   Allergies  Allergen Reactions  . Zytiga [Abiraterone] Diarrhea    Current Medications: Current Outpatient Medications  Medication Sig Dispense Refill  . buPROPion (WELLBUTRIN SR) 150 MG 12 hr tablet Take once daily for three days, then increase to one tablet twice daily (Patient taking differently: Take 150 mg by mouth 2 (two) times daily. Take once daily for three days, then increase to one tablet twice daily) 60 tablet 3  . diphenoxylate-atropine (LOMOTIL) 2.5-0.025 MG tablet Take 1 tablet by mouth 4 (four) times daily as needed for diarrhea or loose stools. 120 tablet 3  . feeding supplement, ENSURE ENLIVE, (ENSURE ENLIVE) LIQD Take 237 mLs by mouth 3 (three) times daily between meals. 237 mL 12  . furosemide (LASIX) 20 MG tablet Take 1 tablet (20 mg total) by mouth daily as needed for fluid. (Patient taking differently: Take 20 mg by mouth daily. ) 30 tablet 2  . HYDROcodone-acetaminophen (NORCO) 10-325 MG tablet Take 1 tablet by mouth every 3 (three) hours as needed. For cancer related pain Dx: C61 120 tablet 0  . ondansetron (ZOFRAN) 8 MG tablet TAKE 1 TABLET BY MOUTH EVERY 8 HOURS AS NEEDED FOR NAUSEA (Patient taking differently: Take 8 mg by mouth every 8 (  eight) hours as needed for nausea or vomiting. ) 30 tablet 5  . Oxycodone HCl 10 MG TABS One tablet every six hours scheduled, and one every three hours as needed for breakthrough pain (Patient taking differently: Take 10 mg by mouth See admin instructions. Take 10mg  by mouth every six hours scheduled, and 10mg  every three hours as needed for breakthrough pain) 120 tablet 0  . predniSONE (DELTASONE) 5 MG tablet Take 1 tablet (5 mg total) by mouth 2 (two) times daily with a meal. 60 tablet 2  . abiraterone acetate (ZYTIGA) 250 MG tablet Take 4 tablets (1,000 mg total) by mouth daily. Take on an empty stomach 1 hour before or 2 hours after a meal (Patient not taking: Reported on 03/13/2018) 120 tablet 0   No current facility-administered  medications for this visit.     Review of Systems  Constitutional: Positive for malaise/fatigue (marked). Negative for chills, diaphoresis, fever and weight loss (up 5 pounds).       Beginning to feel better after discontinuation of abiraterone.  HENT: Negative.  Negative for congestion, ear discharge, ear pain, nosebleeds, sinus pain and sore throat.   Eyes: Negative.  Negative for blurred vision, double vision, photophobia, pain and discharge.  Respiratory: Positive for shortness of breath (exertional). Negative for cough, hemoptysis and sputum production.   Cardiovascular: Negative for chest pain, palpitations, orthopnea and PND.       PMH (+) for MI  Gastrointestinal: Positive for diarrhea and nausea. Negative for abdominal pain, blood in stool, constipation, melena and vomiting.       Decreased oral intake on abiraterone.  Genitourinary: Negative for dysuria, frequency, hematuria and urgency.       Nephrostomy tubes; exchange due 03/20/2018.  Musculoskeletal: Negative for back pain, falls, joint pain and myalgias.       Sacral pain from sitting  Skin: Negative for itching and rash.  Neurological: Negative for dizziness, tremors, sensory change, speech change, focal weakness, weakness and headaches.  Endo/Heme/Allergies: Bruises/bleeds easily.  Psychiatric/Behavioral: Negative for depression and memory loss. The patient is not nervous/anxious and does not have insomnia.   All other systems reviewed and are negative.  Performance status (ECOG): 2-3  Vital Signs: BP (!) 184/80 (BP Location: Left Arm, Patient Position: Sitting)   Pulse 80   Temp (!) 96.4 F (35.8 C) (Tympanic)   Resp (!) 80   Wt 130 lb 8 oz (59.2 kg)   BMI 18.20 kg/m   Physical Exam  Constitutional: He is oriented to person, place, and time. He appears malnourished.  Chronically ill appearing gentleman.  HENT:  Head: Normocephalic and atraumatic.  Nose: Nose normal.  Mouth/Throat: No oropharyngeal exudate.   Alopecia.  Scruffy beard. Temporal wasting.  Edentulous.  Eyes: Pupils are equal, round, and reactive to light. Conjunctivae and EOM are normal. No scleral icterus.  Blue eyes  Neck: Normal range of motion. Neck supple. No JVD present.  Cardiovascular: Normal rate and normal heart sounds. Exam reveals no gallop and no friction rub.  No murmur heard. Pulmonary/Chest: Effort normal and breath sounds normal. No respiratory distress. He has no wheezes. He has no rales.  Prominent ribs.  Abdominal: Soft. Bowel sounds are normal. He exhibits no distension. There is no tenderness. There is no rebound and no guarding.  Musculoskeletal: Normal range of motion. He exhibits edema. He exhibits no tenderness.  Bilateral 1+ lower extremity edema.  Lymphadenopathy:    He has no cervical adenopathy.    He has no axillary  adenopathy.       Right: No inguinal and no supraclavicular adenopathy present.       Left: No inguinal and no supraclavicular adenopathy present.  Neurological: He is alert and oriented to person, place, and time.  Skin: Skin is warm and dry. No rash noted. No erythema.  Psychiatric: Mood, affect and judgment normal.  Nursing note and vitals reviewed.   No visits with results within 3 Day(s) from this visit.  Latest known visit with results is:  Hospital Outpatient Visit on 03/13/2018  Component Date Value Ref Range Status  . Specimen Description 03/13/2018    Final                   Value:URINE, CLEAN CATCH Performed at [facility], [address] [place], [place] [address]   . Special Requests 03/13/2018    Final                   Value:NONE Performed at [facility], [address] [place], [place] [address]   . Culture 03/13/2018    Final                   Value:NO GROWTH Performed at [facility], [place] [address], [place],  [address]   . Report Status 03/13/2018 03/14/2018 FINAL   Final    Assessment:  Michael Small is a 62 y.o.  male with metastatic prostate cancer s/p transrectal prostate biopsies and cystoscopy on 05/24/2017.  Prostate biopsies revealed acinar adenocarcinoma involving all 12 cores.  Adenocarcinoma involved colonic mucosa and skeletal muscle.  Gleason score was 9 (4+5), grade group 5.  Pathologic stage was T4N1M1c.  PSA was 2338.8 on 04/24/2017.  Abdomen and pelvic CT on 04/27/2017 revealed prominent pelvic adenopathy with pathologic retroperitoneal adenopathy, abnormal retroperitoneal stranding, and abnormal inflammatory type stranding along the pelvic sidewalls and perirectal space.  A representative right external iliac lymph node measured 3.2 cm.  A left external iliac lymph node measured 2.1 cm.  There was conglomerate left periaortic adenopathy 2.1 cm and a 1.4 cm aortocaval node.   There was abnormal adenopathy in the perirectal space with prominent wall thickening in the rectum and moderate wall thickening in the sigmoid colon. The findings along the pelvic sidewalls appeared to be causing distal ureteral obstruction resulting in moderate bilateral hydronephrosis and hydroureter.  Lymphoma or prostate cancer was favored. A right external iliac node measured 3.2 cm.  There was bladder wall thickening suggesting cystitis.  There was prostatomegaly.  Bone scan on 06/22/2017 revealed findings consistent with diffuse metastatic disease.  There was activity in right temporal area, sternum, anterior and posterior ribs bilaterally, and entire spine.  Non-contrast chest CT on 06/27/2017 revealed right infrahilar prominence, progressed from prior CT, worrisome for a node/nodule, but poorly visualized given the lack of IV contrast.  There was mid thoracic lymphadenopathy, progressed, including a dominant 14 mm short axis node at the left thoracic inlet.  There was stable 19 x 9 mm irregular nodule at the right lung apex.  There was a sclerotic osseous metastases at T8-9.  Bone scan on 03/09/2018 revealed widespread  osseous metastatic disease with super scan appearance, progressive since prior exam.  Abdomen and pelvis CT on 03/14/2018 revealed interval progression of widespread sclerotic bony metastatic involvement with new areas of lucency in the axial skeleton and bony Pelvis.  There was evidence of interval renal atrophy, right greater than left. There was mild persistent right  hydronephrosis.  There was interval removal of percutaneous nephrostomy catheters with internal ureteral stents visualized bilaterally in situ.  There was mild lymphadenopathy in the pelvic sidewall bilaterally.  There was diffuse body wall, retroperitoneal, and mesenteric edema.  There was persistent circumferential wall thickening in the distal rectum.  He received Lupron 22.5 mg (06/01/2017, 10/02/2017, and 01/02/2018) and Degarelix 240 mg on 06/15/2017.  He received Casodex from 06/13/2017 - 06/15/2017.  He began abiraterone on 02/28/2018 and discontinued after 2 days secondary to nausea, vomiting, diarrhea, and fatigue.  Testosterone was 98 on 06/14/2017, < 3 on 09/07/2017, and < 3 on 03/16/2018.  PSA has been followed:  2338.8 on 04/24/2017, 3603.0 on 06/23/2017, 1035 on 07/26/2017, 241 on 09/07/2017, 342 on 01/23/2018, and 799 on December 26, 202019.  He was admitted to [facility] from 06/12/2017 - 06/15/2017 with hyperkalemia and acute renal failure.  Creatinine was 7.23.  He underwent emergent dialysis x1. He is s/p bilateral external nephrostomy tube placement on 06/14/2017.  He was admitted to [facility] from 07/28/2017 - 07/31/2017 with sepsis secondary to UTI with MSSA bacteremia.    Bilateral lower extremity duplex on 06/12/2017 revealed no evidence of DVT.   Code status is DNR/DNI.  Symptomatically, he is beginning to feel better after discontinuation of abiraterone.  He has had issues with fatigue, nausea, vomiting, and diarrhea.  Exam is stable.  Plan: 1. Labs today:  CMP, testosterone. 2. Metastatic prostate cancer:  Discuss  interval scans.  Images personally reviewed.  Agree with radiology interpretation.  Continue Lupron Lupron every 3 months (due 03/27/2018).  Discuss side effects associated with brief exposure to abiraterone.    Discuss plan to decrease abiraterone to 1 pill/day and advance slowly if tolerated.  Discuss plan for LFTs every 2 weeks x 3 months then monthly on abiraterone.   Discuss plan to switch to Casodex if unsuccessful with abiraterone (intolerant or increasing PSA). 3. Bone metastasis:  Continue to hold Xgeva secondary to renal insufficiency and risk for hypocalcemia.  Patient is scheduled for nephrostomy tube exhange on 03/20/2018. 4. RTC on 03/27/2018 for MD assessment, labs (CBC with diff, CMP), and Lupron.   Honor Loh, NP  03/16/2018, 10:48 AM  I saw and evaluated the patient, participating in the key portions of the service and reviewing pertinent diagnostic studies and records.  I reviewed the nurse practitioner's note and agree with the findings and the plan.  The assessment and plan were discussed with the patient.  Multiple questions were asked by the patient and answered.   Nolon Stalls, MD 03/16/2018,10:48 AM

## 2018-03-16 NOTE — Telephone Encounter (Signed)
Needing refills on:  HYDROcodone-acetaminophen (NORCO) 10-325 MG tablet Oxycodone HCl 10 MG TABS  Please fill at:  [id number] [address], [place] [phone] (Phone) [phone] (Fax   Thanks, [employer]

## 2018-03-16 NOTE — Progress Notes (Signed)
Patient here today for scan results.  Patient states he took [place] for two days and it made him sick.  He stopped taking.

## 2018-03-16 NOTE — Telephone Encounter (Signed)
Please review. Thanks!  

## 2018-03-17 LAB — TESTOSTERONE: Testosterone: <3 ng/dL — ABNORMAL LOW (ref 264–916)

## 2018-03-17 MED ORDER — HYDROCODONE-ACETAMINOPHEN 10-325 MG PO TABS: 1.0000 | ORAL_TABLET | ORAL | 0 refills | Status: DC | PRN

## 2018-03-19 ENCOUNTER — Other Ambulatory Visit: Payer: Self-pay | Admitting: Family Medicine

## 2018-03-19 MED ORDER — CEFAZOLIN SODIUM-DEXTROSE 2-4 GM/100ML-% IV SOLN
2.0000 g | INTRAVENOUS | Status: AC
  Administered 2018-03-20: 2 g via INTRAVENOUS

## 2018-03-19 MED ORDER — OXYCODONE HCL 10 MG PO TABS: ORAL_TABLET | ORAL | 0 refills | Status: DC

## 2018-03-19 NOTE — Telephone Encounter (Signed)
Pt's wife Parke Simmers contacted office for refill request on the following medications:  Oxycodone HCl 10 MG TABS  [facility] was [facility] on 03/16/18 but only Hydrocodone was sent to the pharmacy. Parke Simmers stated pt is out of Oxycodone HCl 10 MG TABS and is requesting it be sent asap. Please advise. Thanks TNP

## 2018-03-19 NOTE — Telephone Encounter (Signed)
Please advise 

## 2018-03-20 ENCOUNTER — Ambulatory Visit
Admission: RE | Admit: 2018-03-20 | Discharge: 2018-03-20 | Disposition: A | Discharge status: Home / Self Care | Payer: Medicare Other | Source: Ambulatory Visit | Attending: Urology | Admitting: Urology

## 2018-03-20 ENCOUNTER — Encounter
Admission: RE | Disposition: A | Discharge status: Home / Self Care | Payer: Self-pay | Source: Ambulatory Visit | Attending: Urology

## 2018-03-20 ENCOUNTER — Encounter: Payer: Self-pay | Admitting: Urology

## 2018-03-20 ENCOUNTER — Ambulatory Visit: Payer: Medicare Other

## 2018-03-20 DIAGNOSIS — I129 Hypertensive chronic kidney disease with stage 1 through stage 4 chronic kidney disease, or unspecified chronic kidney disease: Secondary | ICD-10-CM | POA: Diagnosis not present

## 2018-03-20 DIAGNOSIS — J449 Chronic obstructive pulmonary disease, unspecified: Secondary | ICD-10-CM | POA: Insufficient documentation

## 2018-03-20 DIAGNOSIS — Z79899 Other long term (current) drug therapy: Secondary | ICD-10-CM | POA: Insufficient documentation

## 2018-03-20 DIAGNOSIS — Z955 Presence of coronary angioplasty implant and graft: Secondary | ICD-10-CM | POA: Diagnosis not present

## 2018-03-20 DIAGNOSIS — F17290 Nicotine dependence, other tobacco product, uncomplicated: Secondary | ICD-10-CM | POA: Insufficient documentation

## 2018-03-20 DIAGNOSIS — I2510 Atherosclerotic heart disease of native coronary artery without angina pectoris: Secondary | ICD-10-CM | POA: Diagnosis not present

## 2018-03-20 DIAGNOSIS — N135 Crossing vessel and stricture of ureter without hydronephrosis: Secondary | ICD-10-CM | POA: Insufficient documentation

## 2018-03-20 DIAGNOSIS — C61 Malignant neoplasm of prostate: Secondary | ICD-10-CM

## 2018-03-20 DIAGNOSIS — F329 Major depressive disorder, single episode, unspecified: Secondary | ICD-10-CM | POA: Diagnosis not present

## 2018-03-20 DIAGNOSIS — C799 Secondary malignant neoplasm of unspecified site: Secondary | ICD-10-CM | POA: Diagnosis not present

## 2018-03-20 DIAGNOSIS — N1330 Unspecified hydronephrosis: Secondary | ICD-10-CM | POA: Diagnosis not present

## 2018-03-20 DIAGNOSIS — N189 Chronic kidney disease, unspecified: Secondary | ICD-10-CM | POA: Diagnosis not present

## 2018-03-20 DIAGNOSIS — E785 Hyperlipidemia, unspecified: Secondary | ICD-10-CM | POA: Diagnosis not present

## 2018-03-20 DIAGNOSIS — I252 Old myocardial infarction: Secondary | ICD-10-CM | POA: Insufficient documentation

## 2018-03-20 DIAGNOSIS — Z466 Encounter for fitting and adjustment of urinary device: Secondary | ICD-10-CM | POA: Diagnosis not present

## 2018-03-20 DIAGNOSIS — J439 Emphysema, unspecified: Secondary | ICD-10-CM | POA: Diagnosis not present

## 2018-03-20 DIAGNOSIS — Z7952 Long term (current) use of systemic steroids: Secondary | ICD-10-CM | POA: Diagnosis not present

## 2018-03-20 HISTORY — PX: CYSTOSCOPY WITH STENT PLACEMENT: SHX5790

## 2018-03-20 HISTORY — PX: CYSTOSCOPY W/ RETROGRADES: SHX1426

## 2018-03-20 SURGERY — CYSTOSCOPY, WITH STENT INSERTION
Anesthesia: General | Site: Ureter | Laterality: Bilateral

## 2018-03-20 MED ORDER — CEFAZOLIN SODIUM-DEXTROSE 2-4 GM/100ML-% IV SOLN
INTRAVENOUS | Status: AC
  Filled 2018-03-20: qty 100

## 2018-03-20 MED ORDER — IOTHALAMATE MEGLUMINE 43 % IV SOLN
INTRAVENOUS | Status: DC | PRN
  Administered 2018-03-20: 15 mL via URETHRAL

## 2018-03-20 MED ORDER — MIDAZOLAM HCL 2 MG/2ML IJ SOLN
INTRAMUSCULAR | Status: DC | PRN
  Administered 2018-03-20: 2 mg via INTRAVENOUS

## 2018-03-20 MED ORDER — MEPERIDINE HCL 50 MG/ML IJ SOLN: 6.2500 mg | INTRAMUSCULAR | Status: DC | PRN

## 2018-03-20 MED ORDER — EPHEDRINE SULFATE 50 MG/ML IJ SOLN
INTRAMUSCULAR | Status: DC | PRN
  Administered 2018-03-20 (×2): 5 mg via INTRAVENOUS

## 2018-03-20 MED ORDER — OXYCODONE HCL 5 MG/5ML PO SOLN: 5.0000 mg | Freq: Once | ORAL | Status: DC | PRN

## 2018-03-20 MED ORDER — LIDOCAINE HCL (PF) 2 % IJ SOLN
INTRAMUSCULAR | Status: AC
  Filled 2018-03-20: qty 10

## 2018-03-20 MED ORDER — LIDOCAINE HCL (CARDIAC) PF 100 MG/5ML IV SOSY
PREFILLED_SYRINGE | INTRAVENOUS | Status: DC | PRN
  Administered 2018-03-20: 60 mg via INTRAVENOUS

## 2018-03-20 MED ORDER — FAMOTIDINE 20 MG PO TABS
20.0000 mg | ORAL_TABLET | Freq: Once | ORAL | Status: AC
  Administered 2018-03-20: 20 mg via ORAL

## 2018-03-20 MED ORDER — DEXAMETHASONE SODIUM PHOSPHATE 10 MG/ML IJ SOLN
INTRAMUSCULAR | Status: DC | PRN
  Administered 2018-03-20: 4 mg via INTRAVENOUS

## 2018-03-20 MED ORDER — ONDANSETRON HCL 4 MG/2ML IJ SOLN
INTRAMUSCULAR | Status: AC
  Filled 2018-03-20: qty 2

## 2018-03-20 MED ORDER — OXYCODONE HCL 5 MG PO TABS: 5.0000 mg | ORAL_TABLET | Freq: Once | ORAL | Status: DC | PRN

## 2018-03-20 MED ORDER — DEXAMETHASONE SODIUM PHOSPHATE 10 MG/ML IJ SOLN
INTRAMUSCULAR | Status: AC
  Filled 2018-03-20: qty 1

## 2018-03-20 MED ORDER — PHENYLEPHRINE HCL 10 MG/ML IJ SOLN
INTRAMUSCULAR | Status: DC | PRN
  Administered 2018-03-20: 50 ug via INTRAVENOUS
  Administered 2018-03-20: 150 ug via INTRAVENOUS
  Administered 2018-03-20: 100 ug via INTRAVENOUS

## 2018-03-20 MED ORDER — PROPOFOL 10 MG/ML IV BOLUS
INTRAVENOUS | Status: DC | PRN
  Administered 2018-03-20: 100 mg via INTRAVENOUS

## 2018-03-20 MED ORDER — PROMETHAZINE HCL 25 MG/ML IJ SOLN: 6.2500 mg | INTRAMUSCULAR | Status: DC | PRN

## 2018-03-20 MED ORDER — MIDAZOLAM HCL 2 MG/2ML IJ SOLN
INTRAMUSCULAR | Status: AC
  Filled 2018-03-20: qty 2

## 2018-03-20 MED ORDER — FENTANYL CITRATE (PF) 100 MCG/2ML IJ SOLN: 25.0000 ug | INTRAMUSCULAR | Status: DC | PRN

## 2018-03-20 MED ORDER — FENTANYL CITRATE (PF) 100 MCG/2ML IJ SOLN
INTRAMUSCULAR | Status: AC
  Filled 2018-03-20: qty 2

## 2018-03-20 MED ORDER — FAMOTIDINE 20 MG PO TABS
ORAL_TABLET | ORAL | Status: AC
  Administered 2018-03-20: 20 mg via ORAL
  Filled 2018-03-20: qty 1

## 2018-03-20 MED ORDER — LACTATED RINGERS IV SOLN
INTRAVENOUS | Status: DC
  Administered 2018-03-20: 09:00:00 via INTRAVENOUS

## 2018-03-20 MED ORDER — FENTANYL CITRATE (PF) 100 MCG/2ML IJ SOLN
INTRAMUSCULAR | Status: DC | PRN
  Administered 2018-03-20 (×2): 50 ug via INTRAVENOUS

## 2018-03-20 MED ORDER — ONDANSETRON HCL 4 MG/2ML IJ SOLN
INTRAMUSCULAR | Status: DC | PRN
  Administered 2018-03-20: 4 mg via INTRAVENOUS

## 2018-03-20 SURGICAL SUPPLY — 21 items
BAG DRAIN CYSTO-URO LG1000N (MISCELLANEOUS) ×3 IMPLANT
BRUSH SCRUB EZ  4% CHG (MISCELLANEOUS)
BRUSH SCRUB EZ 4% CHG (MISCELLANEOUS) IMPLANT
CATH URETL 5X70 OPEN END (CATHETERS) ×3 IMPLANT
CONRAY 43 FOR UROLOGY 50M (MISCELLANEOUS) ×3 IMPLANT
COVER WAND RF STERILE (DRAPES) ×3 IMPLANT
GLOVE BIO SURGEON STRL SZ8 (GLOVE) ×3 IMPLANT
GOWN STANDARD XL  REUSABL (MISCELLANEOUS) ×3 IMPLANT
KIT TURNOVER CYSTO (KITS) ×3 IMPLANT
PACK CYSTO AR (MISCELLANEOUS) ×3 IMPLANT
SENSORWIRE 0.038 NOT ANGLED (WIRE) ×3
SET CYSTO W/LG BORE CLAMP LF (SET/KITS/TRAYS/PACK) ×3 IMPLANT
SOL .9 NS 3000ML IRR  AL (IV SOLUTION) ×2
SOL .9 NS 3000ML IRR UROMATIC (IV SOLUTION) ×1 IMPLANT
STENT URET 6FRX24 CONTOUR (STENTS) IMPLANT
STENT URET 6FRX26 CONTOUR (STENTS) IMPLANT
STENT URO INLAY 6FRX24CM (STENTS) ×6 IMPLANT
SURGILUBE 2OZ TUBE FLIPTOP (MISCELLANEOUS) ×3 IMPLANT
SYRINGE IRR TOOMEY STRL 70CC (SYRINGE) IMPLANT
WATER STERILE IRR 1000ML POUR (IV SOLUTION) ×3 IMPLANT
WIRE SENSOR 0.038 NOT ANGLED (WIRE) ×1 IMPLANT

## 2018-03-20 NOTE — Anesthesia Post-op Follow-up Note (Signed)
Anesthesia QCDR form completed.        

## 2018-03-20 NOTE — Interval H&P Note (Signed)
History and Physical Interval Note:  03/20/2018 9:22 AM  Michael Small  has presented today for surgery, with the diagnosis of bilateral hydronephrosis, prostate cancer  The various methods of treatment have been discussed with the patient and family. After consideration of risks, benefits and other options for treatment, the patient has consented to  Procedure(s): CYSTOSCOPY WITH STENT EXCHANGE (Bilateral) as a surgical intervention .  The patient's history has been reviewed, patient examined, no change in status, stable for surgery.  I have reviewed the patient's chart and labs.  Questions were answered to the patient's satisfaction.     [place]

## 2018-03-20 NOTE — Discharge Instructions (Addendum)
Cystoscopy, Care After Refer to this sheet in the next few weeks. These instructions provide you with information about caring for yourself after your procedure. Your health care provider may also give you more specific instructions. Your treatment has been planned according to current medical practices, but problems sometimes occur. Call your health care provider if you have any problems or questions after your procedure. What can I expect after the procedure? After the procedure, it is common to have: Mild pain when you urinate. Pain should stop within a few minutes after you urinate. This may last for up to 1 week. A small amount of blood in your urine for several days. Feeling like you need to urinate but producing only a small amount of urine.  Follow these instructions at home:  Medicines Take over-the-counter and prescription medicines only as told by your health care provider. If you were prescribed an antibiotic medicine, take it as told by your health care provider. Do not stop taking the antibiotic even if you start to feel better. General instructions  Return to your normal activities as told by your health care provider. Ask your health care provider what activities are safe for you. Do not drive for 24 hours if you received a sedative. Watch for any blood in your urine. If the amount of blood in your urine increases, call your health care provider. Follow instructions from your health care provider about eating or drinking restrictions. If a tissue sample was removed for testing (biopsy) during your procedure, it is your responsibility to get your test results. Ask your health care provider or the department performing the test when your results will be ready. Drink enough fluid to keep your urine clear or pale yellow. Keep all follow-up visits as told by your health care provider. This is important. Contact a health care provider if: You have pain that gets worse or does not get  better with medicine, especially pain when you urinate. You have difficulty urinating. Get help right away if: You have more blood in your urine. You have blood clots in your urine. You have abdominal pain. You have a fever or chills. You are unable to urinate. This information is not intended to replace advice given to you by your health care provider. Make sure you discuss any questions you have with your health care provider. Document Released: 12/03/2004 Document Revised: 10/22/2015 Document Reviewed: 04/02/2015 Elsevier Interactive Patient Education  2018 [employer].    Discharge instructions   DISCHARGE INSTRUCTIONS FOR URETERAL STENT   MEDICATIONS:  1. Resume all your other meds from home.  2. AZO (over-the-counter) can help with the burning/stinging when you urinate.  ACTIVITY:  1. May resume regular activities in 24 hours. 2. No driving while on narcotic pain medications  3. Drink plenty of water  4. Continue to walk at home - you can still get blood clots when you are at home, so keep active, but don't over do it.  5. May return to work/school tomorrow or when you feel ready   BATHING:  1. You can shower or bathe.  SIGNS/SYMPTOMS TO CALL:  Please call us if you have a fever greater than 101.5, uncontrolled nausea/vomiting, uncontrolled pain, dizziness, unable to urinate, excessively bloody urine, chest pain, shortness of breath, leg swelling, leg pain, or any other concerns or questions.   You can reach [place] at [phone].   FOLLOW-UP:  1. You will be contacted regarding a follow-up appointment  [place]  1) The drugs that you were given will stay in your system until tomorrow so for the next 24 hours you should not:  A) Drive an automobile B) Make any legal decisions C) Drink any alcoholic beverage   2) You may resume regular meals tomorrow.  Today it is better to start with liquids and gradually work up to solid  foods.  You may eat anything you prefer, but it is better to start with liquids, then soup and crackers, and gradually work up to solid foods.   3) Please notify your doctor immediately if you have any unusual bleeding, trouble breathing, redness and pain at the surgery site, drainage, fever, or pain not relieved by medication.    4) Additional Instructions:        Please contact your physician with any problems or Same Day Surgery at [phone], Monday through Friday 6 am to 4 pm, or [place] at [facility] number at [phone].

## 2018-03-20 NOTE — Anesthesia Preprocedure Evaluation (Signed)
Anesthesia Evaluation  Patient identified by MRN, date of birth, ID band Patient awake    Reviewed: Allergy & Precautions, NPO status , Patient's Chart, lab work & pertinent test results  History of Anesthesia Complications Negative for: history of anesthetic complications  Airway Mallampati: II  TM Distance: >3 FB Neck ROM: Full    Dental  (+) Edentulous Upper, Edentulous Lower   Pulmonary neg sleep apnea, COPD, Current Smoker,    breath sounds clear to auscultation- rhonchi (-) wheezing      Cardiovascular hypertension, + CAD, + Past MI and + Cardiac Stents (~5 yrs ago)   Rhythm:Regular Rate:Normal - Systolic murmurs and - Diastolic murmurs    Neuro/Psych PSYCHIATRIC DISORDERS Depression negative neurological ROS     GI/Hepatic Neg liver ROS, GERD  ,  Endo/Other  negative endocrine ROSneg diabetes  Renal/GU Renal InsufficiencyRenal disease     Musculoskeletal  (+) Arthritis ,   Abdominal (+) - obese,   Peds  Hematology negative hematology ROS (+)   Anesthesia Other Findings Past Medical History: No date: Chronic kidney disease No date: Depression No date: GERD (gastroesophageal reflux disease) No date: Hyperlipidemia No date: Hypertension No date: Myocardial infarction (HCC)     Comment:  s/p stent No date: Neuromuscular disorder (HCC)     Comment:  numbness in arms and legs No date: Prostate cancer ([place])   Reproductive/Obstetrics                             Anesthesia Physical Anesthesia Plan  ASA: III  Anesthesia Plan: General   Post-op Pain Management:    Induction: Intravenous  PONV Risk Score and Plan: 0 and Ondansetron and Midazolam  Airway Management Planned: LMA  Additional Equipment:   Intra-op Plan:   Post-operative Plan:   Informed Consent: I have reviewed the patients History and Physical, chart, labs and discussed the procedure including the risks,  benefits and alternatives for the proposed anesthesia with the patient or authorized representative who has indicated his/her understanding and acceptance.   Dental advisory given  Plan Discussed with: CRNA and Anesthesiologist  Anesthesia Plan Comments:         Anesthesia Quick Evaluation

## 2018-03-20 NOTE — Anesthesia Postprocedure Evaluation (Signed)
Anesthesia Post Note  Patient: Michael Small  Procedure(s) Performed: CYSTOSCOPY WITH STENT EXCHANGE (Bilateral Ureter) CYSTOSCOPY WITH RETROGRADE PYELOGRAM (Bilateral Ureter)  Patient location during evaluation: PACU Anesthesia Type: General Level of consciousness: awake and alert and oriented Pain management: pain level controlled Vital Signs Assessment: post-procedure vital signs reviewed and stable Respiratory status: spontaneous breathing, nonlabored ventilation and respiratory function stable Cardiovascular status: blood pressure returned to baseline and stable Postop Assessment: no signs of nausea or vomiting Anesthetic complications: no     Last Vitals:  Vitals:   03/20/18 1028 03/20/18 1058  BP: 128/89   Pulse:    Resp: 18   Temp:  (!) 36.3 C  SpO2:      Last Pain:  Vitals:   03/20/18 1058  TempSrc:   PainSc: 0-No pain                 Dane Bloch

## 2018-03-20 NOTE — Transfer of Care (Signed)
Immediate Anesthesia Transfer of Care Note  Patient: Michael Small  Procedure(s) Performed: CYSTOSCOPY WITH STENT EXCHANGE (Bilateral Ureter) CYSTOSCOPY WITH RETROGRADE PYELOGRAM (Bilateral Ureter)  Patient Location: PACU  Anesthesia Type:General  Level of Consciousness: drowsy  Airway & Oxygen Therapy: Patient Spontanous Breathing and Patient connected to face mask  Post-op Assessment: Report given to RN and Post -op Vital signs reviewed and stable  Post vital signs: Reviewed and stable  Last Vitals:  Vitals Value Taken Time  BP 126/60 03/20/2018 10:13 AM  Temp 36.4 C 03/20/2018 10:16 AM  Pulse 71 03/20/2018 10:16 AM  Resp 17 03/20/2018 10:16 AM  SpO2 100 % 03/20/2018 10:16 AM    Last Pain:  Vitals:   03/20/18 1016  TempSrc: Tympanic  PainSc:          Complications: No apparent anesthesia complications

## 2018-03-20 NOTE — Anesthesia Procedure Notes (Signed)
Procedure Name: MAC Date/Time: 03/20/2018 9:34 AM Performed by: Emmie Niemann, MD Pre-anesthesia Checklist: Patient identified, Emergency Drugs available, Suction available, Patient being monitored and Timeout performed Patient Re-evaluated:Patient Re-evaluated prior to induction Oxygen Delivery Method: Circle system utilized Preoxygenation: Pre-oxygenation with 100% oxygen Induction Type: IV induction Ventilation: Mask ventilation without difficulty LMA: LMA inserted LMA Size: 3.5 Number of attempts: 1 Placement Confirmation: positive ETCO2,  CO2 detector and breath sounds checked- equal and bilateral Tube secured with: Tape Dental Injury: Teeth and Oropharynx as per pre-operative assessment

## 2018-03-20 NOTE — H&P (Signed)
03/20/2018 9:18 AM   Michael Small Jan 14, 1956 [id number]  Referring provider: No referring provider defined for this encounter.  Urologic history: 1. 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 Pilar Jarvis December 2018 with a PSA of 2339 and CT showing extensive pelvic, retroperitoneal and perirectal adenopathy with bilateral hydronephrosis. -Biopsy performed 12/26 under anesthesia with suspicion of invasion of the rectal wall confirmed on biopsy.  Underwent placement of a right ureteral stent and left ureteral orifice could not be identified Bone scan with diffuse metastatic disease -Started ADT January 2019 (Lupron without antiandrogen) -Admitted 1/15 with acute renal failure (creatinine 6.91) and PSA greater than 3000.  Given [place].  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  He has been seen by oncology and started on Zytiga/prednisone.  He has bilateral ureteral obstruction and stents were last changed June 2019.  He presents for cystoscopy with bilateral retrograde pyelograms and stent exchange.  Preoperative urine culture was negative.   PMH: Past Medical History:  Diagnosis Date  . Chronic kidney disease   . Depression   . GERD (gastroesophageal reflux disease)   . Hyperlipidemia   . Hypertension   . Myocardial infarction [facility])    s/p stent  . Neuromuscular disorder (HCC)    numbness in arms and legs  . Prostate cancer [facility])      Surgical History: Past Surgical History:  Procedure Laterality Date  . CORONARY ANGIOPLASTY WITH STENT PLACEMENT  12/2011   [facility]  . CYSTOSCOPY W/ RETROGRADES Bilateral 05/24/2017   Procedure: CYSTOSCOPY WITH RETROGRADE PYELOGRAM;  Surgeon: [name], MD;  Location: [facility] ORS;  Service: Urology;  Laterality: Bilateral;  . CYSTOSCOPY W/ RETROGRADES Bilateral 11/21/2017   Procedure: CYSTOSCOPY WITH RETROGRADE PYELOGRAM;  Surgeon: [name], MD;  Location: [facility] ORS;  Service: Urology;  Laterality: Bilateral;  . CYSTOSCOPY W/ URETERAL STENT PLACEMENT Bilateral 11/21/2017   Procedure: CYSTOSCOPY WITH STENT REPLACEMENT;  Surgeon: [name], MD;  Location: [facility] ORS;  Service: Urology;  Laterality: Bilateral;  . CYSTOSCOPY W/ URETERAL STENT REMOVAL Bilateral 11/21/2017   Procedure: CYSTOSCOPY WITH STENT REMOVAL;  Surgeon: [name], MD;  Location: [facility] ORS;  Service: Urology;  Laterality: Bilateral;  . CYSTOSCOPY WITH STENT PLACEMENT Right 05/24/2017   Procedure: CYSTOSCOPY WITH STENT PLACEMENT;  Surgeon: [name], MD;  Location: [facility] ORS;  Service: Urology;  Laterality: Right;  . IR NEPHROSTOGRAM LEFT THRU EXISTING ACCESS  07/21/2017  . IR NEPHROSTOGRAM LEFT THRU EXISTING ACCESS  08/18/2017  . IR NEPHROSTOGRAM RIGHT THRU EXISTING ACCESS  08/18/2017  . IR NEPHROSTOMY PLACEMENT LEFT  06/15/2017  . IR NEPHROSTOMY PLACEMENT RIGHT  06/14/2017  . IR  URETERAL STENT PLACEMENT EXISTING ACCESS LEFT  07/21/2017  . PICC LINE INSERTION N/A 08/02/2017   Procedure: PICC LINE INSERTION;  Surgeon: Katha Cabal, MD;  Location: [facility];  Service: Cardiovascular;  Laterality: N/A;  . PROSTATE BIOPSY N/A 05/24/2017   Procedure: PROSTATE BIOPSY;  Surgeon: [name], MD;  Location: [facility] ORS;  Service: Urology;  Laterality: N/A;  . TEE WITHOUT CARDIOVERSION N/A 07/31/2017   Procedure: TRANSESOPHAGEAL ECHOCARDIOGRAM (TEE);  Surgeon: Teodoro Spray, MD;   Location: [facility] ORS;  Service: Cardiovascular;  Laterality: N/A;  . US ECHOCARDIOGRAPHY  12/2011   Mild LV dysfunction, EF=45%    Home Medications:  Medication Sig Dispense Refill  . buPROPion (WELLBUTRIN SR) 150 MG 12 hr tablet Take once daily for three days, then increase to one tablet twice daily (Patient taking differently: Take 150 mg by mouth 2 (two) times daily. Take once daily for three days, then increase to one tablet twice daily) 60 tablet 3  . diphenoxylate-atropine (LOMOTIL) 2.5-0.025 MG tablet Take 1 tablet by mouth 4 (four) times daily as needed for diarrhea or loose stools. 120 tablet 3  . feeding supplement, ENSURE ENLIVE, (ENSURE ENLIVE) LIQD Take 237 mLs by mouth 3 (three) times daily between meals. 237 mL 12  . furosemide (LASIX) 20 MG tablet Take 1 tablet (20 mg total) by mouth daily as needed for fluid. (Patient taking differently: Take 20 mg by mouth daily. ) 30 tablet 2  . HYDROcodone-acetaminophen (NORCO) 10-325 MG tablet Take 1 tablet by mouth every 3 (three) hours as needed. For cancer related pain Dx: C61 120 tablet 0  . ondansetron (ZOFRAN) 8 MG tablet TAKE 1 TABLET BY MOUTH EVERY 8 HOURS AS NEEDED FOR NAUSEA (Patient taking differently: Take 8 mg by mouth every 8 (eight) hours as needed for nausea or vomiting. ) 30 tablet 5  . Oxycodone HCl 10 MG TABS One tablet every six hours scheduled, and one every three hours as needed for breakthrough pain (Patient taking differently: Take 10 mg by mouth See admin instructions. Take 10mg  by mouth every six hours scheduled, and 10mg  every three hours as needed for breakthrough pain) 120 tablet 0  . predniSONE (DELTASONE) 5 MG tablet Take 1 tablet (5 mg total) by mouth 2 (two) times daily with a meal. 60 tablet 2  . abiraterone acetate (ZYTIGA) 250 MG tablet Take 4 tablets (1,000 mg total) by mouth daily. Take on an empty stomach 1 hour before or 2 hours after a meal (Patient not taking: Reported on 03/13/2018) 120 tablet 0     Allergies:  Allergies  Allergen Reactions  . Zytiga [Abiraterone] Diarrhea    Family History: Family History  Problem Relation Age of Onset  . Cancer Mother   . Hypertension Mother   . Diabetes Father   . Heart disease Father   . Cancer Paternal Grandfather   . Prostate cancer Neg Hx   . Bladder Cancer Neg Hx   . Kidney cancer Neg Hx     Social History:  reports that he has been smoking cigars. He has a 48.00 pack-year smoking history. He has never used smokeless tobacco. He reports that he does not drink alcohol or use drugs.  ROS: Otherwise negative to except as per the HPI  Physical Exam: BP (!) 184/80   Pulse 79   Temp 97.8 F (36.6 C) (Tympanic)   Resp 16   Ht 5\' 11"  (1.803 m)   Wt 59.4 kg   SpO2 100%  BMI 18.27 kg/m   Constitutional:  Alert and oriented, No acute distress. HEENT: [place] AT, moist mucus membranes.  Trachea midline, no masses. Cardiovascular: No clubbing, cyanosis, or edema.  RRR Respiratory: Normal respiratory effort, no increased work of breathing.  Lungs clear GI: Abdomen is soft, nontender, nondistended, no abdominal masses GU: No CVA tenderness Lymph: No cervical or inguinal lymphadenopathy. Skin: No rashes, bruises or suspicious lesions. Neurologic: Grossly intact, no focal deficits, moving all 4 extremities. Psychiatric: Normal mood and affect.   Assessment & Plan:   62 year old male with metastatic prostate cancer presents for bilateral retrograde pyelograms and stent exchange.  The procedure has been discussed in detail including potential risks of bleeding and infection/sepsis.  All questions were answered and he desires to proceed.  [name], [place] [address] [place],  [address] [phone]

## 2018-03-20 NOTE — Op Note (Signed)
Preoperative diagnosis:  1. 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 Cystoscopy with bilateral stent removal 2. Bilateral retrograde pyelography with interpretation 3. Exchange of ureteral stent-bilateral (Bard Optima 6 Fr/24 cm)  Surgeon: [name], MD  Anesthesia: General  Complications: None  Intraoperative findings:  Retrograde pyelogram on the right showed  moderate right hydronephrosis/proximal hydroureter. Left retrograde pyelogram with mild proximal ureteral dilation and mild hydronephrosis.    EBL: Minimal  Specimens: None  Indication: Michael Small is a 62 y.o. patient with metastatic prostate cancer and bilateral ureteral obstruction. Recently found to have castrate resistant disease and started on Zytiga by oncology.  His stents were last exchanged in June 2019.  After reviewing the management options for treatment, he elected to proceed with the above surgical procedure(s). We have discussed the potential benefits and risks of the procedure, side effects of the proposed treatment, the likelihood of the patient achieving the goals of the procedure, and any potential problems that might occur during the procedure or recuperation. Informed consent has been obtained.  Description of procedure:  The patient was taken to the operating room and general anesthesia was induced.  The patient was placed in the dorsal lithotomy position, prepped and draped in the usual sterile fashion, and preoperative antibiotics were administered. A preoperative time-out was performed.   The urethral meatus would not accept a 22 French cystoscope and was dilated with [employer] sounds from 58 -26 [place].  The cystoscope was then easily inserted per urethra.  The urethra was normal in caliber  without stricture.  Prostate was nonocclusive without abnormal appearing tissue.  Panendoscopy was performed and there and bullous edema noted around the ureteral orifices bilaterally secondary to the indwelling stents.  No other mucosal abnormalities, solid or papillary lesions were noted.    The right ureteral stent was grasped with endoscopic forceps and brought out to the urethral meatus and a 0.038 Sensor wire was advanced through the stent and up to the renal pelvis under fluoroscopic guidance. A 6 French open-ended ureteral catheter was placed over the guidewire and the guidewire was removed.  Retrograde pyelograms performed with findings as described above.    A Bard Optima 6 French/24 cm double-J ureteral stent was placed under fluoroscopic guidance without difficulty.  There was good positioning noted proximally and distally.  Attention was directed to the left ureteral orifice where the indwelling stent was grasped with endoscopic forceps and brought to the urethral meatus.  The guidewire was advanced through the stent to the renal pelvis.  The ureteral stent was removed.  A 6 French open-ended ureteral catheter was placed over the guidewire and retrograde pyelogram scan performed with findings as described above. A 6 French/24 cm double-J ureteral stent was placed under fluoroscopic guidance without difficulty.  Good positioning was noted proximally and distally.  The bladder was emptied and all instrument were removed.  After anesthetic reversal he was transported to PACU in stable condition.   [name], M.D.

## 2018-03-25 ENCOUNTER — Other Ambulatory Visit: Payer: Self-pay | Admitting: Family Medicine

## 2018-03-26 ENCOUNTER — Other Ambulatory Visit: Payer: Self-pay | Admitting: *Deleted

## 2018-03-26 DIAGNOSIS — C61 Malignant neoplasm of prostate: Secondary | ICD-10-CM

## 2018-03-27 ENCOUNTER — Inpatient Hospital Stay: Payer: Medicare Other

## 2018-03-27 ENCOUNTER — Inpatient Hospital Stay (HOSPITAL_BASED_OUTPATIENT_CLINIC_OR_DEPARTMENT_OTHER): Payer: Medicare Other | Admitting: Hematology and Oncology

## 2018-03-27 ENCOUNTER — Encounter: Payer: Self-pay | Admitting: Family Medicine

## 2018-03-27 ENCOUNTER — Encounter: Payer: Self-pay | Admitting: Hematology and Oncology

## 2018-03-27 VITALS — BP 177/75 | HR 77 | Temp 95.5°F | Resp 18 | Wt 129.2 lb

## 2018-03-27 DIAGNOSIS — I10 Essential (primary) hypertension: Secondary | ICD-10-CM

## 2018-03-27 DIAGNOSIS — C7951 Secondary malignant neoplasm of bone: Secondary | ICD-10-CM

## 2018-03-27 DIAGNOSIS — R110 Nausea: Secondary | ICD-10-CM | POA: Diagnosis not present

## 2018-03-27 DIAGNOSIS — R197 Diarrhea, unspecified: Secondary | ICD-10-CM | POA: Diagnosis not present

## 2018-03-27 DIAGNOSIS — C61 Malignant neoplasm of prostate: Secondary | ICD-10-CM

## 2018-03-27 DIAGNOSIS — N289 Disorder of kidney and ureter, unspecified: Secondary | ICD-10-CM | POA: Diagnosis not present

## 2018-03-27 DIAGNOSIS — Z5111 Encounter for antineoplastic chemotherapy: Secondary | ICD-10-CM | POA: Diagnosis not present

## 2018-03-27 DIAGNOSIS — Z79899 Other long term (current) drug therapy: Secondary | ICD-10-CM

## 2018-03-27 DIAGNOSIS — E875 Hyperkalemia: Secondary | ICD-10-CM

## 2018-03-27 DIAGNOSIS — N189 Chronic kidney disease, unspecified: Secondary | ICD-10-CM | POA: Diagnosis not present

## 2018-03-27 DIAGNOSIS — Z7189 Other specified counseling: Secondary | ICD-10-CM

## 2018-03-27 LAB — COMPREHENSIVE METABOLIC PANEL
ALT: 9 U/L (ref 0–44)
AST: 13 U/L — ABNORMAL LOW (ref 15–41)
Albumin: 3.6 g/dL (ref 3.5–5.0)
Alkaline Phosphatase: 258 U/L — ABNORMAL HIGH (ref 38–126)
Anion gap: 5 (ref 5–15)
BUN: 37 mg/dL — ABNORMAL HIGH (ref 8–23)
CO2: 25 mmol/L (ref 22–32)
Calcium: 9.1 mg/dL (ref 8.9–10.3)
Chloride: 111 mmol/L (ref 98–111)
Creatinine, Ser: 2.08 mg/dL — ABNORMAL HIGH (ref 0.61–1.24)
GFR calc Af Amer: 37 mL/min — ABNORMAL LOW (ref >60)
GFR calc non Af Amer: 32 mL/min — ABNORMAL LOW (ref >60)
Glucose, Bld: 100 mg/dL — ABNORMAL HIGH (ref 70–99)
Potassium: 5.6 mmol/L — ABNORMAL HIGH (ref 3.5–5.1)
Sodium: 141 mmol/L (ref 135–145)
Total Bilirubin: 0.4 mg/dL (ref 0.3–1.2)
Total Protein: 6.6 g/dL (ref 6.5–8.1)

## 2018-03-27 LAB — CBC WITH DIFFERENTIAL/PLATELET
Abs Immature Granulocytes: 0.02 10*3/uL (ref 0.00–0.07)
Basophils Absolute: 0.1 10*3/uL (ref 0.0–0.1)
Basophils Relative: 1 %
Eosinophils Absolute: 0.2 10*3/uL (ref 0.0–0.5)
Eosinophils Relative: 3 %
HCT: 39.8 % (ref 39.0–52.0)
Hemoglobin: 12.8 g/dL — ABNORMAL LOW (ref 13.0–17.0)
Immature Granulocytes: 0 %
Lymphocytes Relative: 37 %
Lymphs Abs: 2.6 10*3/uL (ref 0.7–4.0)
MCH: 30.1 pg (ref 26.0–34.0)
MCHC: 32.2 g/dL (ref 30.0–36.0)
MCV: 93.6 fL (ref 80.0–100.0)
Monocytes Absolute: 0.5 10*3/uL (ref 0.1–1.0)
Monocytes Relative: 7 %
Neutro Abs: 3.6 10*3/uL (ref 1.7–7.7)
Neutrophils Relative %: 52 %
Platelets: 258 10*3/uL (ref 150–400)
RBC: 4.25 MIL/uL (ref 4.22–5.81)
RDW: 17.2 % — ABNORMAL HIGH (ref 11.5–15.5)
WBC: 7.1 10*3/uL (ref 4.0–10.5)
nRBC: 0 % (ref 0.0–0.2)

## 2018-03-27 MED ORDER — LEUPROLIDE ACETATE (3 MONTH) 22.5 MG IM KIT
22.5000 mg | PACK | Freq: Once | INTRAMUSCULAR | Status: AC
  Administered 2018-03-27: 22.5 mg via INTRAMUSCULAR
  Filled 2018-03-27: qty 22.5

## 2018-03-27 NOTE — Progress Notes (Signed)
Patient offers no complaints today. 

## 2018-03-27 NOTE — Progress Notes (Signed)
[facility] day:  03/27/2018   Chief Complaint: Michael Small is a 62 y.o. male with metastatic prostate cancer who is seen for assessment after reinitiation of abiraterone and continuation of Lupron.  HPI:   The patient was last seen in the medical oncology clinic on 03/16/2018.   At that time, he was beginning to feel better after discontinuation of abiraterone.  He described extreme fatigue, nausea, vomiting, and diarrhea.  Decision was made to decrease abiraterone from 4 pills/day to 1 pill/day and advance as tolerated.  If he was unable to tolerate abiraterone or had progressive disease, we discussed switch to Casodex.  He was scheduled for tube exchange on 03/20/2018.  During the interim, patient is doing well overall. He continues on abiraterone. He took 1 pill/day x 1 week, and increased to 2 pills/day on 03/23/2018. Energy is stable. He denies nausea, vomiting, and diarrhea. Sacral pain has improved some with the use of a "special cushion". Patient denies that he has experienced any B symptoms. He denies any interval infections.   Patient advises that he maintains an adequate appetite. He is eating well. Weight today is 129 lb 3 oz (58.6 kg), which compared to his last visit to the clinic, represents a 1 pound weight loss.    Patient denies pain in the clinic today.   Past Medical History:  Diagnosis Date  . Chronic kidney disease   . Depression   . GERD (gastroesophageal reflux disease)   . Hyperlipidemia   . Hypertension   . Myocardial infarction [facility])    s/p stent  . Neuromuscular disorder (HCC)    numbness in arms and legs  . Prostate cancer [facility])     Past Surgical History:  Procedure Laterality Date  . CORONARY ANGIOPLASTY WITH STENT PLACEMENT  12/2011   [facility]  . CYSTOSCOPY W/ RETROGRADES Bilateral 05/24/2017   Procedure: CYSTOSCOPY WITH RETROGRADE PYELOGRAM;  Surgeon: [name], MD;  Location: [facility]  ORS;  Service: Urology;  Laterality: Bilateral;  . CYSTOSCOPY W/ RETROGRADES Bilateral 11/21/2017   Procedure: CYSTOSCOPY WITH RETROGRADE PYELOGRAM;  Surgeon: [name], MD;  Location: [facility] ORS;  Service: Urology;  Laterality: Bilateral;  . CYSTOSCOPY W/ RETROGRADES Bilateral 03/20/2018   Procedure: CYSTOSCOPY WITH RETROGRADE PYELOGRAM;  Surgeon: [name], MD;  Location: [facility] ORS;  Service: Urology;  Laterality: Bilateral;  . CYSTOSCOPY W/ URETERAL STENT PLACEMENT Bilateral 11/21/2017   Procedure: CYSTOSCOPY WITH STENT REPLACEMENT;  Surgeon: [name], MD;  Location: [facility] ORS;  Service: Urology;  Laterality: Bilateral;  . CYSTOSCOPY W/ URETERAL STENT REMOVAL Bilateral 11/21/2017   Procedure: CYSTOSCOPY WITH STENT REMOVAL;  Surgeon: [name], MD;  Location: [facility] ORS;  Service: Urology;  Laterality: Bilateral;  . CYSTOSCOPY WITH STENT PLACEMENT Right 05/24/2017   Procedure: CYSTOSCOPY WITH STENT PLACEMENT;  Surgeon: [name], MD;  Location: [facility] ORS;  Service: Urology;  Laterality: Right;  . CYSTOSCOPY WITH STENT PLACEMENT Bilateral 03/20/2018   Procedure: CYSTOSCOPY WITH STENT EXCHANGE;  Surgeon: [name], MD;  Location: [facility] ORS;  Service: Urology;  Laterality: Bilateral;  . IR NEPHROSTOGRAM LEFT THRU EXISTING ACCESS  07/21/2017  . IR NEPHROSTOGRAM LEFT THRU EXISTING ACCESS  08/18/2017  . IR NEPHROSTOGRAM RIGHT THRU EXISTING ACCESS  08/18/2017  . IR NEPHROSTOMY PLACEMENT LEFT  06/15/2017  . IR NEPHROSTOMY PLACEMENT RIGHT  06/14/2017  . IR URETERAL STENT PLACEMENT EXISTING ACCESS LEFT  07/21/2017  . PICC LINE INSERTION  N/A 08/02/2017   Procedure: PICC LINE INSERTION;  Surgeon: Katha Cabal, MD;  Location: [facility];  Service: Cardiovascular;  Laterality: N/A;  . PROSTATE BIOPSY N/A 05/24/2017   Procedure: PROSTATE BIOPSY;  Surgeon: [name], MD;  Location: [facility] ORS;  Service: Urology;  Laterality: N/A;  . TEE WITHOUT CARDIOVERSION N/A  07/31/2017   Procedure: TRANSESOPHAGEAL ECHOCARDIOGRAM (TEE);  Surgeon: Teodoro Spray, MD;  Location: [facility] ORS;  Service: Cardiovascular;  Laterality: N/A;  . US ECHOCARDIOGRAPHY  12/2011   Mild LV dysfunction, EF=45%    Family History  Problem Relation Age of Onset  . Cancer Mother   . Hypertension Mother   . Diabetes Father   . Heart disease Father   . Cancer Paternal Grandfather   . Prostate cancer Neg Hx   . Bladder Cancer Neg Hx   . Kidney cancer Neg Hx     Social History:  reports that he has been smoking cigars. He has a 48.00 pack-year smoking history. He has never used smokeless tobacco. He reports that he does not drink alcohol or use drugs.  He has smoked 1 pack per day since age 22 (51 years).  He smokes small cigars.  He does not drink alcohol, but previously drank heavily.  He describes being exposed to Round Up and asbestos.  He has been disabled for several years secondary to "nerve damage and problems in his legs, arms, and bad back".  He fell off a ladder.  He previously worked for a [employer], [employer].  He lives in [place].  He has step children.  The patient is accompanied by his wife today.  Allergies:  Allergies  Allergen Reactions  . Zytiga [Abiraterone] Diarrhea    Current Medications: Current Outpatient Medications  Medication Sig Dispense Refill  . abiraterone acetate (ZYTIGA) 250 MG tablet Take 4 tablets (1,000 mg total) by mouth daily. Take on an empty stomach 1 hour before or 2 hours after a meal 120 tablet 0  . buPROPion (WELLBUTRIN SR) 150 MG 12 hr tablet Take 1 tablet (150 mg total) by mouth 2 (two) times daily. 180 tablet 4  . diphenoxylate-atropine (LOMOTIL) 2.5-0.025 MG tablet Take 1 tablet by mouth 4 (four) times daily as needed for diarrhea or loose stools. 120 tablet 3  . feeding supplement, ENSURE ENLIVE, (ENSURE ENLIVE) LIQD Take 237 mLs by mouth 3 (three) times daily between meals. 237 mL 12  . furosemide (LASIX) 20 MG tablet Take 1 tablet  (20 mg total) by mouth daily as needed for fluid. (Patient taking differently: Take 20 mg by mouth daily. ) 30 tablet 2  . HYDROcodone-acetaminophen (NORCO) 10-325 MG tablet Take 1 tablet by mouth every 3 (three) hours as needed. For cancer related pain Dx: C61 120 tablet 0  . ondansetron (ZOFRAN) 8 MG tablet TAKE 1 TABLET BY MOUTH EVERY 8 HOURS AS NEEDED FOR NAUSEA (Patient taking differently: Take 8 mg by mouth every 8 (eight) hours as needed for nausea or vomiting. ) 30 tablet 5  . Oxycodone HCl 10 MG TABS One tablet every six hours scheduled, and one every three hours as needed for breakthrough pain 120 tablet 0  . predniSONE (DELTASONE) 5 MG tablet Take 1 tablet (5 mg total) by mouth 2 (two) times daily with a meal. 60 tablet 2   No current facility-administered medications for this visit.     Review of Systems  Constitutional: Positive for weight loss (down 1 pound). Negative for chills, diaphoresis, fever and  malaise/fatigue.       Energy level is "a little better".  HENT: Negative.   Eyes: Negative.   Respiratory: Positive for shortness of breath (exertional). Negative for cough, hemoptysis and sputum production.   Cardiovascular: Negative for chest pain, palpitations, orthopnea, leg swelling and PND.       PMH (+) for MI  Gastrointestinal: Negative for abdominal pain, blood in stool, constipation, diarrhea (resolved), melena, nausea (resolved) and vomiting (resolved).  Genitourinary: Negative for dysuria, frequency, hematuria and urgency.       Nephrostomy tubes; last exchanged 03/20/2018  Musculoskeletal: Positive for back pain (sacral pain improved with "special cushion"). Negative for falls, joint pain and myalgias.  Skin: Negative for itching and rash.  Neurological: Negative for dizziness, tremors, weakness and headaches.  Endo/Heme/Allergies: Bruises/bleeds easily.  Psychiatric/Behavioral: Negative for depression, memory loss and suicidal ideas. The patient is not  nervous/anxious and does not have insomnia.   All other systems reviewed and are negative.  Performance status (ECOG): 2 - 3  Vital Signs: BP (!) 177/75 (BP Location: Left Arm, Patient Position: Sitting)   Pulse 77   Temp (!) 95.5 F (35.3 C) (Tympanic)   Resp 18   Wt 129 lb 3 oz (58.6 kg)   BMI 18.02 kg/m   Physical Exam  Constitutional: He is oriented to person, place, and time. Cachectic: (+) temporal wasting.  Chronically fatigued appearing thin gentleman sitting comfortably in the exam room in no acute distress.  HENT:  Head: Normocephalic and atraumatic.  Mouth/Throat: Oropharynx is clear and moist and mucous membranes are normal.  Graying beard.  Edentulous.  Eyes: Pupils are equal, round, and reactive to light. Conjunctivae and EOM are normal. No scleral icterus.  Neck: Normal range of motion. Neck supple. No JVD present.  Cardiovascular: Normal rate, regular rhythm, normal heart sounds and intact distal pulses. Exam reveals no gallop and no friction rub.  No murmur heard. Pulmonary/Chest: Effort normal and breath sounds normal. No respiratory distress. He has no wheezes. He has no rales.  Abdominal: Soft. Bowel sounds are normal. He exhibits no distension and no mass. There is no tenderness. There is no rebound and no guarding.  Musculoskeletal: Normal range of motion. He exhibits edema (1+ to BLE). He exhibits no tenderness.  Lymphadenopathy:    He has no cervical adenopathy.    He has no axillary adenopathy.       Right: No inguinal and no supraclavicular adenopathy present.       Left: No inguinal and no supraclavicular adenopathy present.  Neurological: He is alert and oriented to person, place, and time. Gait normal.  Skin: Skin is warm and dry. No rash noted. No erythema.  Psychiatric: Mood, affect and judgment normal.  Smiling.  Interactive.  Nursing note and vitals reviewed.   Appointment on 03/27/2018  Component Date Value Ref Range Status  . Sodium  03/27/2018 141  135 - 145 mmol/L Final  . Potassium 03/27/2018 5.6* 3.5 - 5.1 mmol/L Final  . Chloride 03/27/2018 111  98 - 111 mmol/L Final  . CO2 03/27/2018 25  22 - 32 mmol/L Final  . Glucose, Bld 03/27/2018 100* 70 - 99 mg/dL Final  . BUN 03/27/2018 37* 8 - 23 mg/dL Final  . Creatinine, Ser 03/27/2018 2.08* 0.61 - 1.24 mg/dL Final  . Calcium 03/27/2018 9.1  8.9 - 10.3 mg/dL Final  . Total Protein 03/27/2018 6.6  6.5 - 8.1 g/dL Final  . Albumin 03/27/2018 3.6  3.5 - 5.0 g/dL Final  .  AST 03/27/2018 13* 15 - 41 U/L Final  . ALT 03/27/2018 9  0 - 44 U/L Final  . Alkaline Phosphatase 03/27/2018 258* 38 - 126 U/L Final  . Total Bilirubin 03/27/2018 0.4  0.3 - 1.2 mg/dL Final  . GFR calc non Af Amer 03/27/2018 32* >60 mL/min Final  . GFR calc Af Amer 03/27/2018 37* >60 mL/min Final   Comment: (NOTE) The eGFR has been calculated using the CKD EPI equation. This calculation has not been validated in all clinical situations. eGFR's persistently <60 mL/min signify possible Chronic Kidney Disease.   Georgiann Hahn gap 03/27/2018 5  5 - 15 Final   Performed at [facility], [address] [place], [place] [address]  . WBC 03/27/2018 7.1  4.0 - 10.5 K/uL Final  . RBC 03/27/2018 4.25  4.22 - 5.81 MIL/uL Final  . Hemoglobin 03/27/2018 12.8* 13.0 - 17.0 g/dL Final  . HCT 03/27/2018 39.8  39.0 - 52.0 % Final  . MCV 03/27/2018 93.6  80.0 - 100.0 fL Final  . MCH 03/27/2018 30.1  26.0 - 34.0 pg Final  . MCHC 03/27/2018 32.2  30.0 - 36.0 g/dL Final  . RDW 03/27/2018 17.2* 11.5 - 15.5 % Final  . Platelets 03/27/2018 258  150 - 400 K/uL Final  . nRBC 03/27/2018 0.0  0.0 - 0.2 % Final  . Neutrophils Relative % 03/27/2018 52  % Final  . Neutro Abs 03/27/2018 3.6  1.7 - 7.7 K/uL Final  . Lymphocytes Relative 03/27/2018 37  % Final  . Lymphs Abs 03/27/2018 2.6  0.7 - 4.0 K/uL Final  . Monocytes Relative 03/27/2018 7  % Final  . Monocytes Absolute 03/27/2018 0.5  0.1 - 1.0 K/uL Final  . Eosinophils  Relative 03/27/2018 3  % Final  . Eosinophils Absolute 03/27/2018 0.2  0.0 - 0.5 K/uL Final  . Basophils Relative 03/27/2018 1  % Final  . Basophils Absolute 03/27/2018 0.1  0.0 - 0.1 K/uL Final  . Immature Granulocytes 03/27/2018 0  % Final  . Abs Immature Granulocytes 03/27/2018 0.02  0.00 - 0.07 K/uL Final   Performed at [facility], [address] [place],  [address]    Assessment:  Michael Small is a 62 y.o. male with metastatic prostate cancer s/p transrectal prostate biopsies and cystoscopy on 05/24/2017.  Prostate biopsies revealed acinar adenocarcinoma involving all 12 cores.  Adenocarcinoma involved colonic mucosa and skeletal muscle.  Gleason score was 9 (4+5), grade group 5.  Pathologic stage was T4N1M1c.  PSA was 2338.8 on 04/24/2017.  Abdomen and pelvic CT on 04/27/2017 revealed prominent pelvic adenopathy with pathologic retroperitoneal adenopathy, abnormal retroperitoneal stranding, and abnormal inflammatory type stranding along the pelvic sidewalls and perirectal space.  A representative right external iliac lymph node measured 3.2 cm.  A left external iliac lymph node measured 2.1 cm.  There was conglomerate left periaortic adenopathy 2.1 cm and a 1.4 cm aortocaval node.   There was abnormal adenopathy in the perirectal space with prominent wall thickening in the rectum and moderate wall thickening in the sigmoid colon. The findings along the pelvic sidewalls appeared to be causing distal ureteral obstruction resulting in moderate bilateral hydronephrosis and hydroureter.  Lymphoma or prostate cancer was favored. A right external iliac node measured 3.2 cm.  There was bladder wall thickening suggesting cystitis.  There was prostatomegaly.  Bone scan on 06/22/2017 revealed findings consistent with diffuse metastatic disease.  There was activity in right temporal area, sternum, anterior and posterior  ribs bilaterally, and entire spine.  Non-contrast chest CT on  06/27/2017 revealed right infrahilar prominence, progressed from prior CT, worrisome for a node/nodule, but poorly visualized given the lack of IV contrast.  There was mid thoracic lymphadenopathy, progressed, including a dominant 14 mm short axis node at the left thoracic inlet.  There was stable 19 x 9 mm irregular nodule at the right lung apex.  There was a sclerotic osseous metastases at T8-9.  Bone scan on 03/09/2018 revealed widespread osseous metastatic disease with super scan appearance, progressive since prior exam.  Abdomen and pelvis CT on 03/14/2018 revealed interval progression of widespread sclerotic bony metastatic involvement with new areas of lucency in the axial skeleton and bony Pelvis.  There was evidence of interval renal atrophy, right greater than left. There was mild persistent right hydronephrosis.  There was interval removal of percutaneous nephrostomy catheters with internal ureteral stents visualized bilaterally in situ.  There was mild lymphadenopathy in the pelvic sidewall bilaterally.  There was diffuse body wall, retroperitoneal, and mesenteric edema.  There was persistent circumferential wall thickening in the distal rectum.  He received Lupron 22.5 mg (06/01/2017, 10/02/2017, and 01/02/2018) and Degarelix 240 mg on 06/15/2017.  He received Casodex from 06/13/2017 - 06/15/2017.  He began abiraterone on 02/28/2018 and discontinued after 2 days secondary to nausea, vomiting, diarrhea, and fatigue.  He restarted abiraterone (1 pill/day) on 03/16/2018 and increased to 2 pills/day on 03/23/2018.  Testosterone was 98 on 06/14/2017, < 3 on 09/07/2017, and < 3 on 03/16/2018.  PSA has been followed:  2338.8 on 04/24/2017, 3603.0 on 06/23/2017, 1035 on 07/26/2017, 241 on 09/07/2017, 342 on 01/23/2018, and 799 on 12-11-202019.  He was admitted to [facility] from 06/12/2017 - 06/15/2017 with hyperkalemia and acute renal failure.  Creatinine was 7.23.  He underwent emergent dialysis x1. He is  s/p bilateral external nephrostomy tube placement on 06/14/2017.  He was admitted to [facility] from 07/28/2017 - 07/31/2017 with sepsis secondary to UTI with MSSA bacteremia.    Bilateral lower extremity duplex on 06/12/2017 revealed no evidence of DVT.   Code status is DNR/DNI.  Symptomatically, patient is feeling better today. Nausea and diarrhea have resolved. Patient's disposition in clinic markedly improved; smiling and more talkative than normal. Energy level stable. Exam grossly unchanged from previous.  WBC 7100 ([place] 3600.  Hemoglobin 12.8.  Potassium elevated at 5.6 mmol/L.  BUN 37 and creatinine 2.08 (CrCl 30.1 mL/min).  AST 13, ALT 9, ALP 258, and total bilirubin 0.4  Plan: 1. Labs today:  CBC with diff, CMP. 2. Metastatic prostate cancer  Doing well. Tolerating abiraterone with no increase in symptoms.   Advancing dose as discussed.   Took 1 pill/day x 1 week, then increased to 2 pills/day on 03/23/2018.  Goal is 4 pills/day.  Review plans for routine monitoring of LFTs every 2 weeks x 3 months, then monthly, while on abiraterone therapy.   Discussed plan to switch to Casodex if unable to tolerate the abiraterone, or if PSA noted to be increasing suggesting a failed response to therapy.   Continues on Lupron injections every 3 months.    Lupron due today. 3. Bone metastasis  Continue to hold Xgeva secondary to renal insufficiency risk for hypocalcemia. 4. Renal insufficiency  Further decline in renal function noted.  BUN 37 and creatinine 2.08 (CrCl 30.1 mL/min).  Encouraged patient to increase fluid intake.  We will send copy of today's labs to PCP Caryn Section, MD). 5. HYPERkalemia  Potassium elevated at 5.6 mmol/L.  Patient denies supplemental potassium.  Message sent to Dr. Caryn Section to make him aware.  Patient to follow-up with Dr. Caryn Section. 6. RTC in 2 weeks for labs (CBC with diff, CMP) 7. RTC in 4 weeks for MD assessment, labs (CBC with diff, CMP, PSA).   Honor Loh, NP  03/27/2018, 10:39 AM  I saw and evaluated the patient, participating in the key portions of the service and reviewing pertinent diagnostic studies and records.  I reviewed the nurse practitioner's note and agree with the findings and the plan.  The assessment and plan were discussed with the patient.  Multiple questions were asked by the patient and answered.   Nolon Stalls, MD 03/27/2018,10:39 AM

## 2018-03-29 ENCOUNTER — Telehealth: Payer: Self-pay | Admitting: *Deleted

## 2018-03-29 ENCOUNTER — Telehealth: Payer: Self-pay | Admitting: Family Medicine

## 2018-03-29 MED ORDER — SODIUM POLYSTYRENE SULFONATE PO POWD: ORAL | 0 refills | Status: DC

## 2018-03-29 NOTE — Telephone Encounter (Signed)
Patient called and states we told him that something was being sent in for his potassium. Nothing has been ordered   )   Ref Range & Units 2d ago  Potassium 3.5 - 5.1 mmol/L 5.6High

## 2018-03-29 NOTE — Telephone Encounter (Signed)
Pt's wife states states at last visit at the cancer center the cancer doctor states the patients potassium was increasing and the patient needed to be started on medication.  States the cancer doctor was to call our office and speak with Dr. Caryn Section regarding the medication and who was going to prescribe it.  Pt's wife states she is concerned b/c last time his potassium was elevated he went into kidney failure.

## 2018-03-29 NOTE — Telephone Encounter (Signed)
Not true. We told him that we would notify his PCP. I sent Dr. Caryn Section a message, he read it, but did not respond. I would direct him to his PCP for further testing/treatment.

## 2018-03-29 NOTE — Telephone Encounter (Signed)
Call returned to patient/ wife and left message on voice mail to contact PCP

## 2018-03-29 NOTE — Telephone Encounter (Signed)
Prescription for kayexalate was sent to [facility].

## 2018-03-29 NOTE — Telephone Encounter (Signed)
Patient wife (per DPR) was advised.,PC

## 2018-04-09 IMAGING — CT CT ABD-PELV W/O CM
2 of 4 series · 15 of 46 positions shown, 17 images · non-contrast
Comparison: CT abdomen pelvis dated April 27, 2017.

CLINICAL DATA: Fever. Redness and drainage at nephrostomy tube
sites. History of prostate cancer.

EXAM:
CT ABDOMEN AND PELVIS WITHOUT CONTRAST
TECHNIQUE: Multidetector CT imaging of the abdomen and pelvis was performed
following the standard protocol without IV contrast.

[Series 2: routine abd/pel wo · axial · 0.75mm/px · z∈[-725,-380]mm · 12 of 80 slices shown, 14 images]
[im 7/80  soft-tissue]
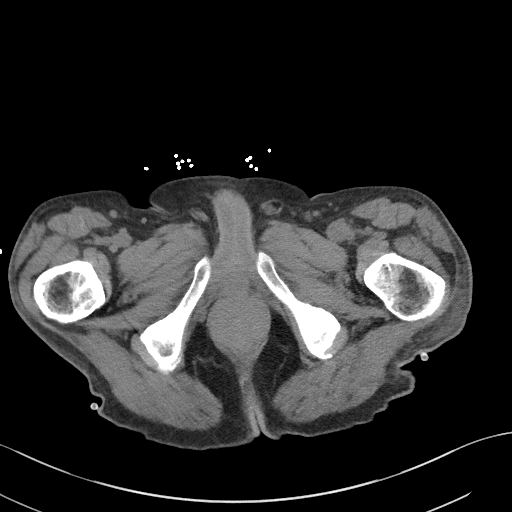
[im 7/80  bone]
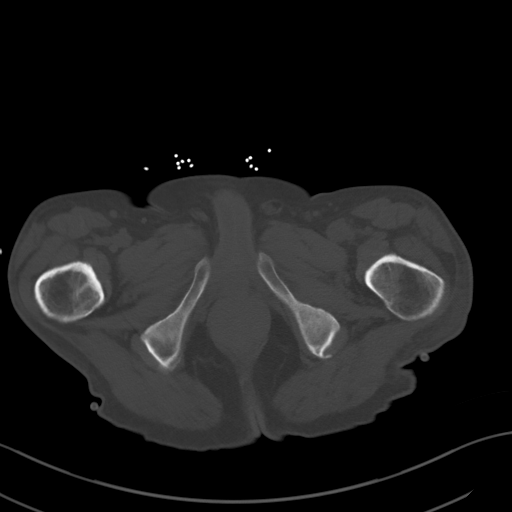
[im 13/80  soft-tissue]
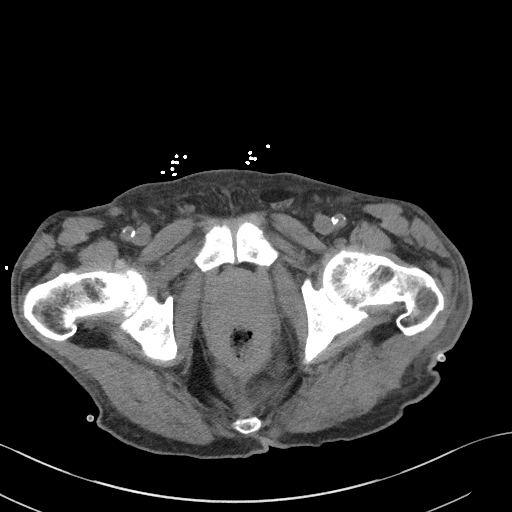
[im 19/80  soft-tissue]
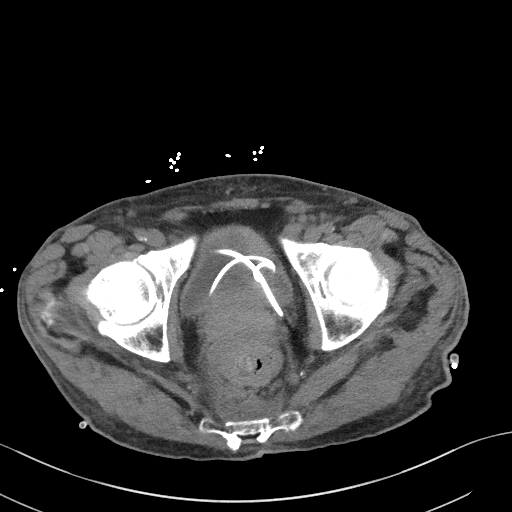
[im 26/80  soft-tissue]
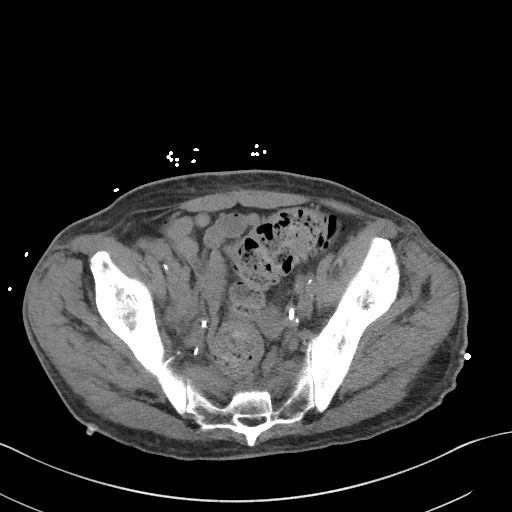
[im 32/80  soft-tissue]
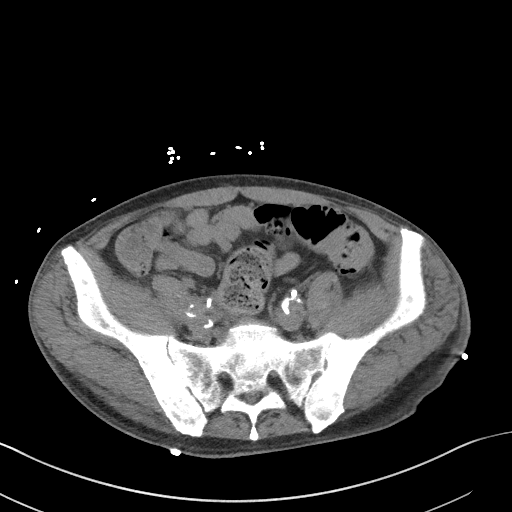
[im 38/80  soft-tissue]
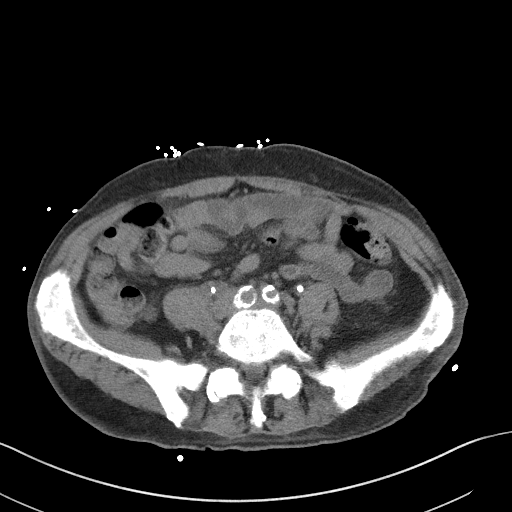
[im 45/80  soft-tissue]
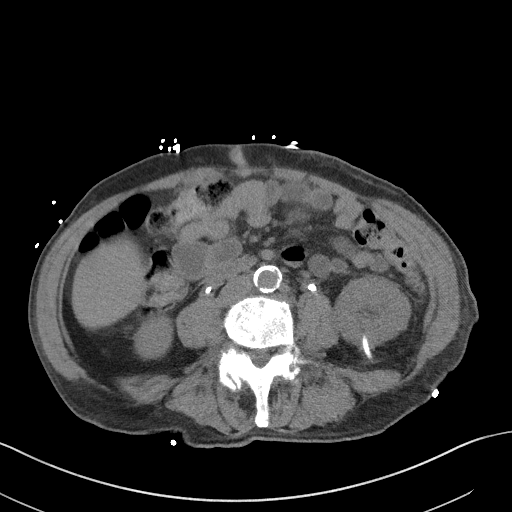
[im 51/80  soft-tissue]
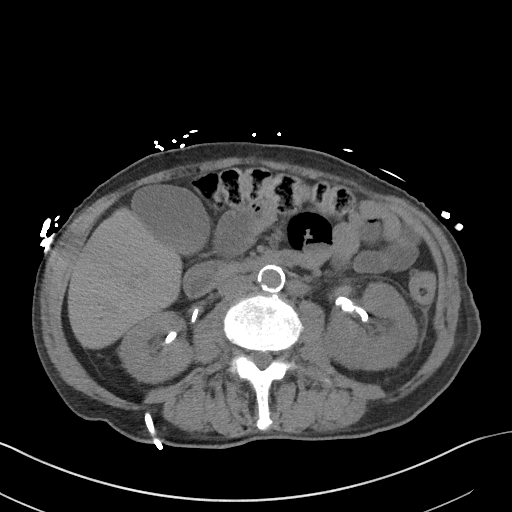
[im 57/80  soft-tissue]
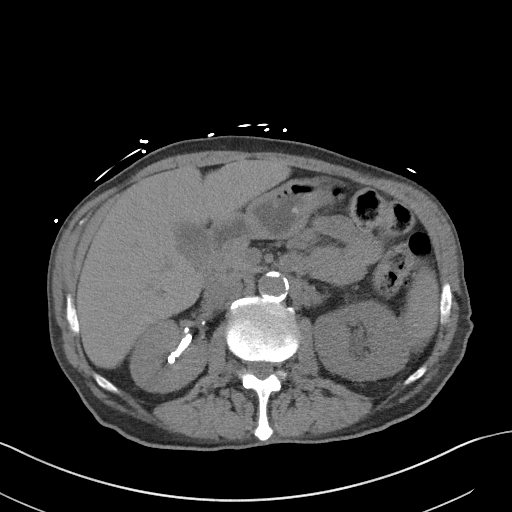
[im 57/80  bone]
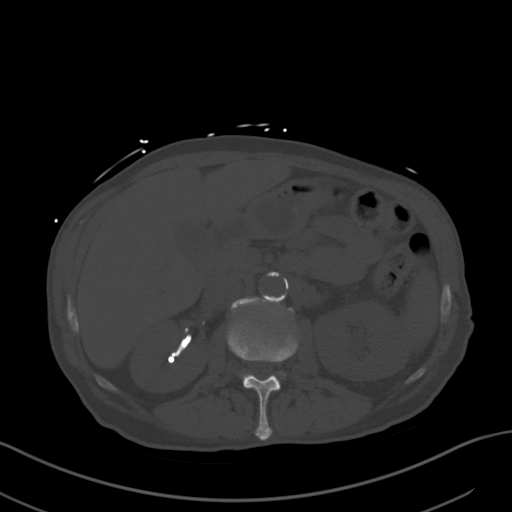
[im 64/80  soft-tissue]
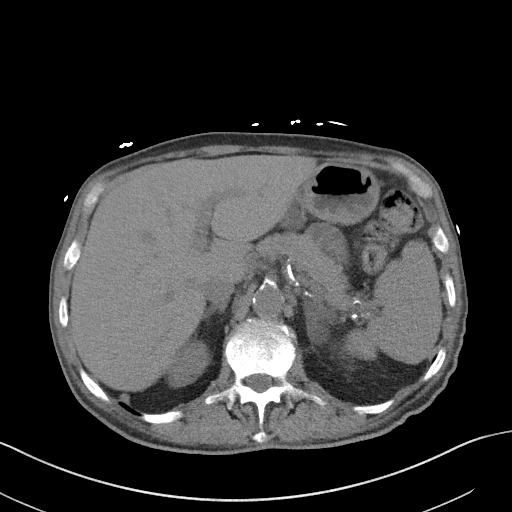
[im 70/80  soft-tissue]
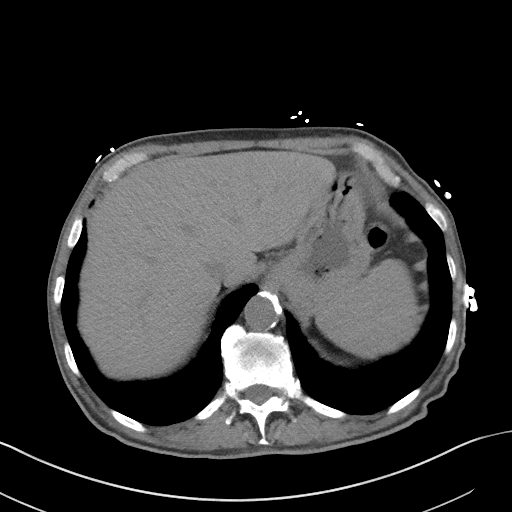
[im 76/80  soft-tissue]
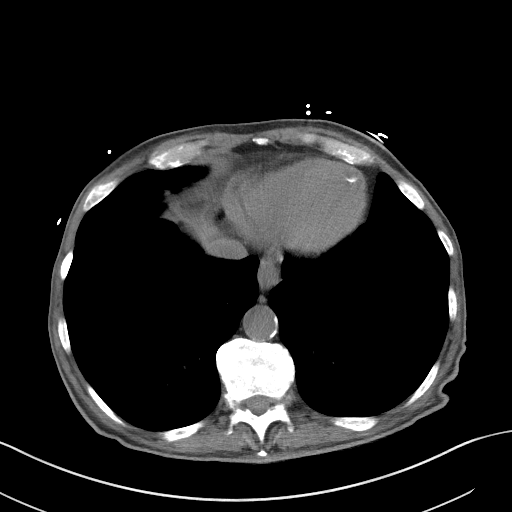

[Series 5: coronal st · coronal · 0.68mm/px · 3 of 83 slices shown]
[im 28/83  soft-tissue]
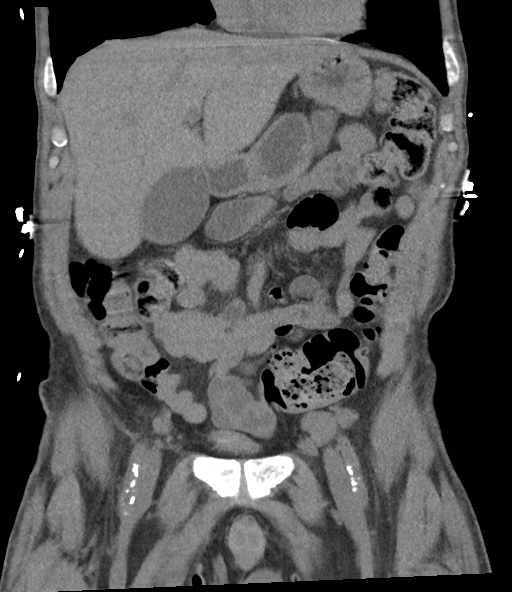
[im 37/83  soft-tissue]
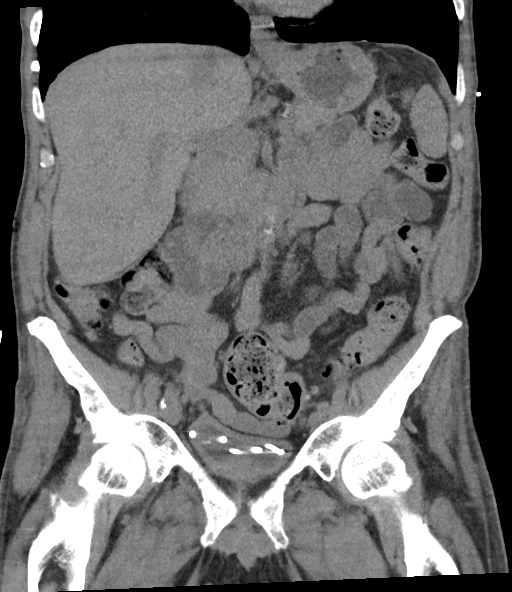
[im 46/83  soft-tissue]
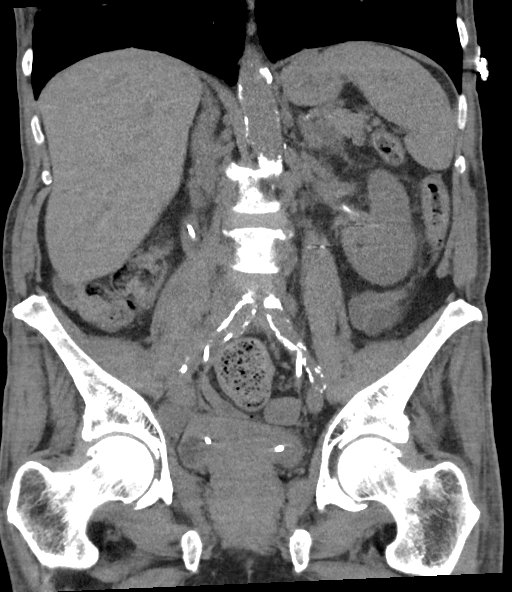

[15 of 46 positions shown; findings below may reference images not displayed]

FINDINGS: Lower chest: No acute abnormality.

Hepatobiliary: No focal liver abnormality is seen. No gallstones,
gallbladder wall thickening, or biliary dilatation.

Pancreas: Unremarkable. No pancreatic ductal dilatation or
surrounding inflammatory changes.

Spleen: Normal in size without focal abnormality.

Adrenals/Urinary Tract: Unchanged left adrenal adenoma. Bilateral
nephrostomy tubes in appropriate position. Bilateral double-J
ureteral stents in appropriate position. No hydronephrosis. The
bladder is decompressed.

Stomach/Bowel: Small hiatal hernia. The stomach is otherwise within
normal limits. Appendix is normal. Mild colonic stool burden.
Unchanged wall thickening of the rectum. No bowel obstruction.

Vascular/Lymphatic: Aortic atherosclerosis. Retroperitoneal, common
iliac, and external iliac lymphadenopathy appears stable to slightly
decreased in size. For example, the right external iliac lymph node
now measures 2.0 cm in short axis, previously 3.2 cm.

Reproductive: Prostatomegaly, unchanged.

Other: Abnormal soft tissue stranding in the retroperitoneum, pelvic
sidewalls, and perirectal and presacral spaces is similar to prior
study. No pneumoperitoneum or free fluid.

Musculoskeletal: New diffuse bony heterogeneity of the visualized
thoracolumbar spine and pelvis, concerning for osseous metastatic
disease. New Schmorl's node versus small superior endplate
compression deformity along the right aspect of the L4 vertebral
body.
IMPRESSION: 1. Appropriately positioned bilateral nephrostomy tubes and double-J
ureteral stents. No hydronephrosis. No soft tissue inflammation or
fluid collection surrounding the nephrostomy tubes at the skin
surface.
2. New diffuse bony heterogeneity of the visualized thoracolumbar
spine and pelvis, concerning for osseous metastatic disease.
3. New Schmorl's node versus small superior endplate compression
deformity along the right aspect of the L4 vertebral body.
4. Stable to interval decrease in size of retroperitoneal, common
iliac, and external iliac lymphadenopathy.
5. Relatively unchanged abnormal soft tissue stranding in the
retroperitoneum, pelvic sidewalls, and perirectal and presacral
spaces.
6.  Aortic atherosclerosis ([id number].G).

## 2018-04-09 IMAGING — CR DG CHEST 2V
1 series · 2 of 2 positions shown · non-contrast
Comparison: 06/27/2017, 06/13/2017

CLINICAL DATA: Shortness of breath, history of prostate cancer,
smoker, emphysema

EXAM:
CHEST  2 VIEW

[Series 1: dg chest 2 view · 0.14mm/px · 2 of 2 slices shown]
[im 1/2]
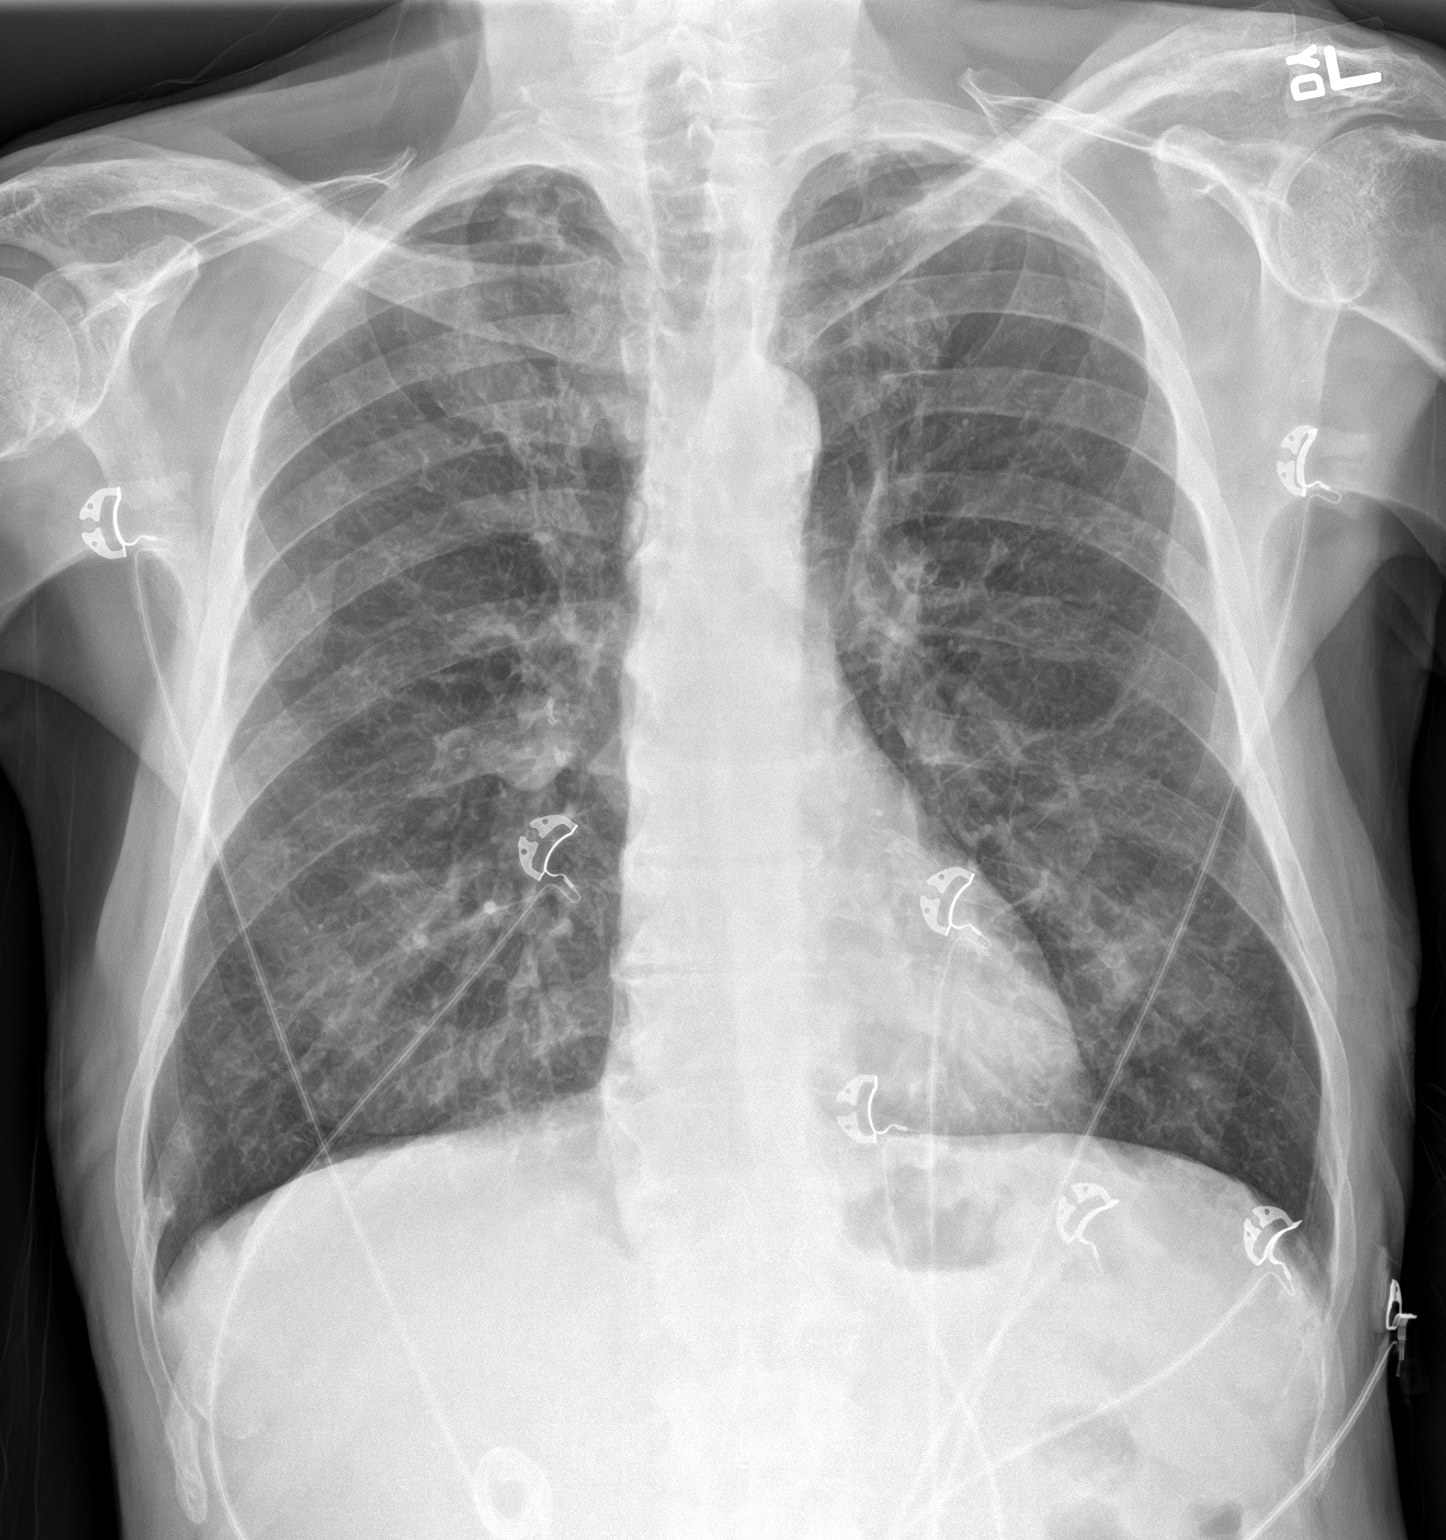
[im 2/2]
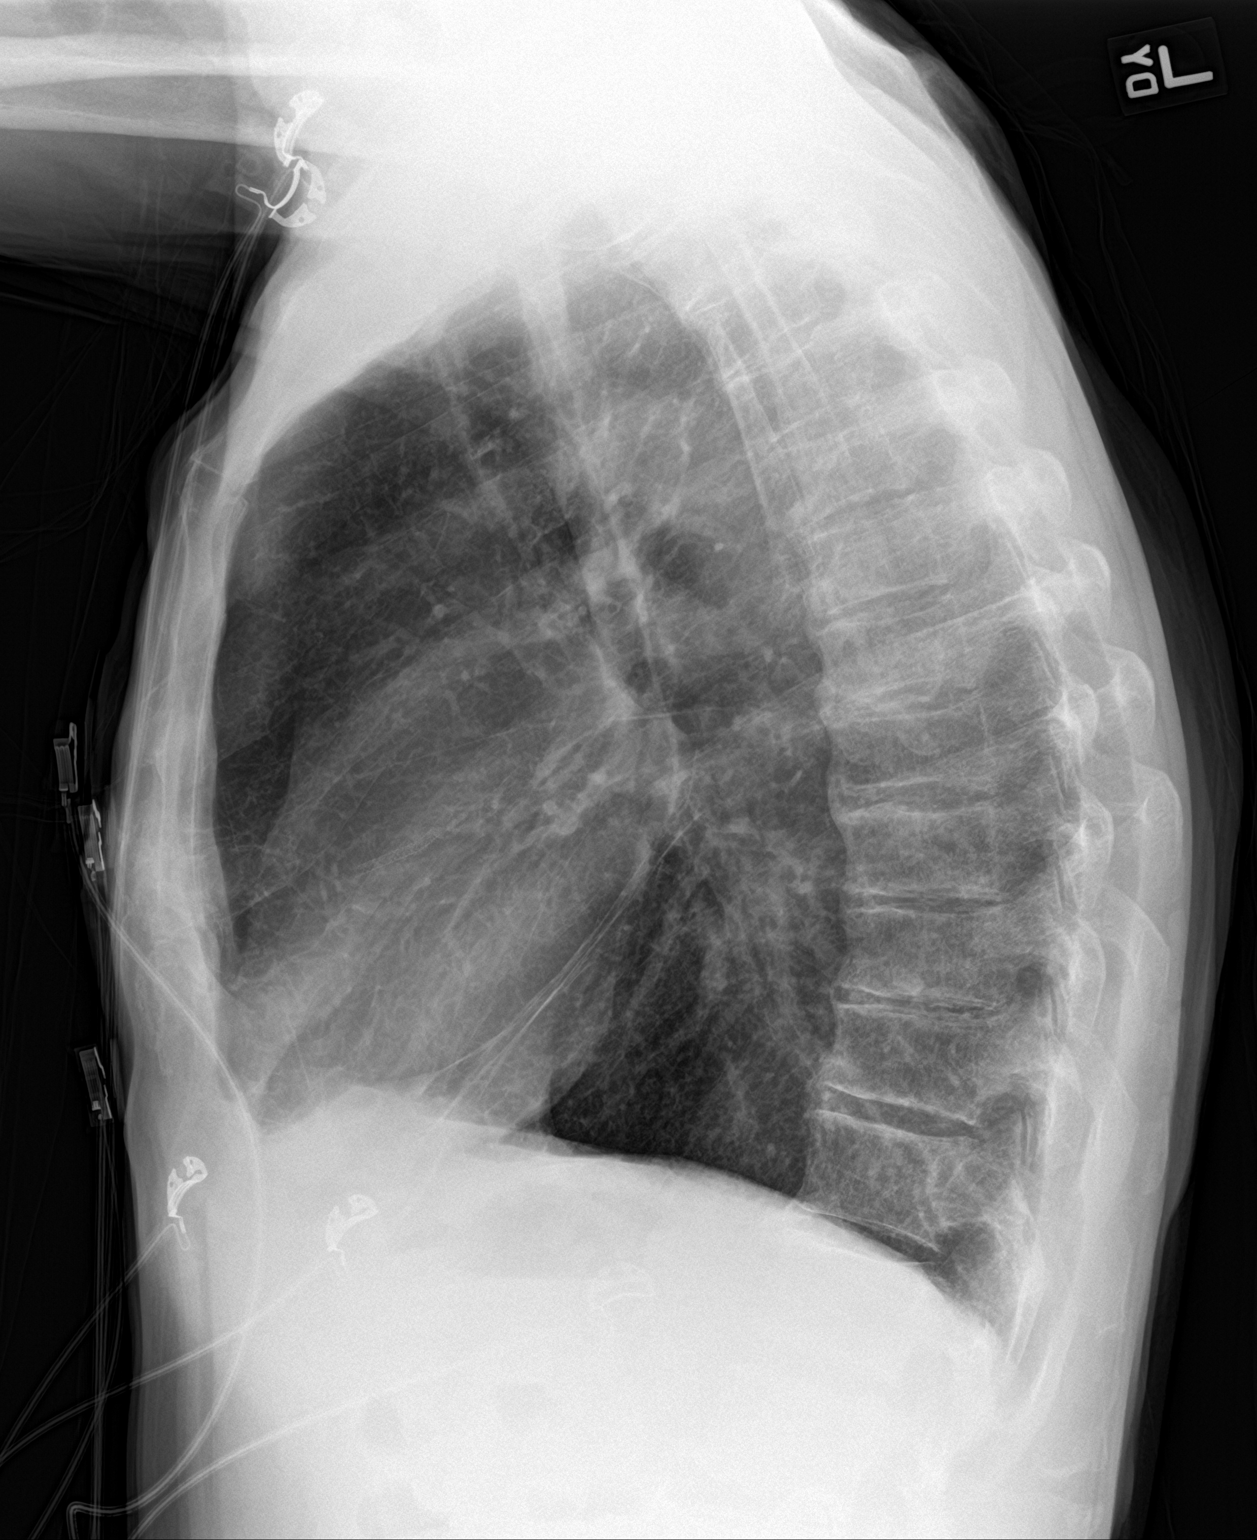

[2 of 2 positions shown; findings below may reference images not displayed]

FINDINGS: Background COPD/emphysema with hyperinflation and parenchymal
scarring. Normal heart size and vascularity. Chronic interstitial
prominence diffusely. No focal collapse, consolidation, pneumonia,
edema, effusion, or pneumothorax.

Stable right hilar fullness suspicious for right hilar adenopathy.

1.9 cm right upper lobe irregular nodule by CT is grossly stable.
IMPRESSION: Stable COPD/emphysema pattern and other chronic findings as above.

No superimposed acute process or significant interval change by
plain radiography.

## 2018-04-10 ENCOUNTER — Inpatient Hospital Stay: Payer: Medicare Other | Attending: Hematology and Oncology

## 2018-04-10 DIAGNOSIS — C61 Malignant neoplasm of prostate: Secondary | ICD-10-CM | POA: Insufficient documentation

## 2018-04-10 DIAGNOSIS — C7951 Secondary malignant neoplasm of bone: Secondary | ICD-10-CM | POA: Insufficient documentation

## 2018-04-10 LAB — CBC WITH DIFFERENTIAL/PLATELET
Abs Immature Granulocytes: 0.01 10*3/uL (ref 0.00–0.07)
Basophils Absolute: 0 10*3/uL (ref 0.0–0.1)
Basophils Relative: 1 %
Eosinophils Absolute: 0.1 10*3/uL (ref 0.0–0.5)
Eosinophils Relative: 2 %
HCT: 36.7 % — ABNORMAL LOW (ref 39.0–52.0)
Hemoglobin: 11.9 g/dL — ABNORMAL LOW (ref 13.0–17.0)
Immature Granulocytes: 0 %
Lymphocytes Relative: 27 %
Lymphs Abs: 1.5 10*3/uL (ref 0.7–4.0)
MCH: 30.7 pg (ref 26.0–34.0)
MCHC: 32.4 g/dL (ref 30.0–36.0)
MCV: 94.8 fL (ref 80.0–100.0)
Monocytes Absolute: 0.3 10*3/uL (ref 0.1–1.0)
Monocytes Relative: 5 %
Neutro Abs: 3.8 10*3/uL (ref 1.7–7.7)
Neutrophils Relative %: 65 %
Platelets: 218 10*3/uL (ref 150–400)
RBC: 3.87 MIL/uL — ABNORMAL LOW (ref 4.22–5.81)
RDW: 17.2 % — ABNORMAL HIGH (ref 11.5–15.5)
WBC: 5.8 10*3/uL (ref 4.0–10.5)
nRBC: 0 % (ref 0.0–0.2)

## 2018-04-10 LAB — COMPREHENSIVE METABOLIC PANEL
ALT: 9 U/L (ref 0–44)
AST: 11 U/L — ABNORMAL LOW (ref 15–41)
Albumin: 3.6 g/dL (ref 3.5–5.0)
Alkaline Phosphatase: 232 U/L — ABNORMAL HIGH (ref 38–126)
Anion gap: 5 (ref 5–15)
BUN: 34 mg/dL — ABNORMAL HIGH (ref 8–23)
CO2: 26 mmol/L (ref 22–32)
Calcium: 8.7 mg/dL — ABNORMAL LOW (ref 8.9–10.3)
Chloride: 109 mmol/L (ref 98–111)
Creatinine, Ser: 2.36 mg/dL — ABNORMAL HIGH (ref 0.61–1.24)
GFR calc Af Amer: 32 mL/min — ABNORMAL LOW (ref >60)
GFR calc non Af Amer: 28 mL/min — ABNORMAL LOW (ref >60)
Glucose, Bld: 134 mg/dL — ABNORMAL HIGH (ref 70–99)
Potassium: 5.1 mmol/L (ref 3.5–5.1)
Sodium: 140 mmol/L (ref 135–145)
Total Bilirubin: 0.3 mg/dL (ref 0.3–1.2)
Total Protein: 6.4 g/dL — ABNORMAL LOW (ref 6.5–8.1)

## 2018-04-13 ENCOUNTER — Other Ambulatory Visit: Payer: Self-pay | Admitting: Hematology and Oncology

## 2018-04-13 ENCOUNTER — Other Ambulatory Visit: Payer: Self-pay | Admitting: *Deleted

## 2018-04-13 DIAGNOSIS — C61 Malignant neoplasm of prostate: Secondary | ICD-10-CM

## 2018-04-13 MED ORDER — PREDNISONE 5 MG PO TABS: 5.0000 mg | ORAL_TABLET | Freq: Two times a day (BID) | ORAL | 2 refills | Status: DC

## 2018-04-13 MED ORDER — ABIRATERONE ACETATE 250 MG PO TABS: 1000.0000 mg | ORAL_TABLET | Freq: Every day | ORAL | 0 refills | Status: DC

## 2018-04-13 MED FILL — predniSONE 5 MG TABS: 5 | 30 days supply | Qty: 60 | Fill #1

## 2018-04-13 NOTE — Telephone Encounter (Signed)
)     Ref Range & Units 3d ago  WBC 4.0 - 10.5 K/uL 5.8   RBC 4.22 - 5.81 MIL/uL 3.87Low    Hemoglobin 13.0 - 17.0 g/dL 11.9Low    HCT 39.0 - 52.0 % 36.7Low    MCV 80.0 - 100.0 fL 94.8   MCH 26.0 - 34.0 pg 30.7   MCHC 30.0 - 36.0 g/dL 32.4   RDW 11.5 - 15.5 % 17.2High    Platelets 150 - 400 K/uL 218   nRBC 0.0 - 0.2 % 0.0   Neutrophils Relative % % 65   Neutro Abs 1.7 - 7.7 K/uL 3.8   Lymphocytes Relative % 27   Lymphs Abs 0.7 - 4.0 K/uL 1.5   Monocytes Relative % 5   Monocytes Absolute 0.1 - 1.0 K/uL 0.3   Eosinophils Relative % 2   Eosinophils Absolute 0.0 - 0.5 K/uL 0.1   Basophils Relative % 1   Basophils Absolute 0.0 - 0.1 K/uL 0.0   Immature Granulocytes % 0   Abs Immature Granulocytes 0.00 - 0.07 K/uL 0.01   Comment: Performed at [facility], [address] [place], [place] [address]  Resulting Agency  [facility]      Specimen Collected: 04/10/18 14:44  Last Resulted: 04/10/18 14:56         )   Ref Range & Units 3d ago  Sodium 135 - 145 mmol/L 140   Potassium 3.5 - 5.1 mmol/L 5.1   Chloride 98 - 111 mmol/L 109   CO2 22 - 32 mmol/L 26   Glucose, Bld 70 - 99 mg/dL 134High    BUN 8 - 23 mg/dL 34High    Creatinine, Ser 0.61 - 1.24 mg/dL 2.36High    Calcium 8.9 - 10.3 mg/dL 8.7Low    Total Protein 6.5 - 8.1 g/dL 6.4Low    Albumin 3.5 - 5.0 g/dL 3.6   AST 15 - 41 U/L 11Low    ALT 0 - 44 U/L 9   Alkaline Phosphatase 38 - 126 U/L 232High    Total Bilirubin 0.3 - 1.2 mg/dL 0.3   GFR calc non Af Amer >60 mL/min 28Low    GFR calc Af Amer >60 mL/min 32Low    Comment: (NOTE)  The eGFR has been calculated using the CKD EPI equation.  This calculation has not been validated in all clinical situations.  eGFR's persistently <60 mL/min signify possible Chronic Kidney  Disease.   Anion gap 5 - 15 5   Comment: Performed at [facility], [address] [place], [place] [address]  Resulting Agency  [facility]      Specimen Collected: 04/10/18 14:44   Last Resulted: 04/10/18 15:05

## 2018-04-17 ENCOUNTER — Other Ambulatory Visit: Payer: Self-pay | Admitting: Family Medicine

## 2018-04-17 MED ORDER — HYDROCODONE-ACETAMINOPHEN 10-325 MG PO TABS: 1.0000 | ORAL_TABLET | ORAL | 0 refills | Status: DC | PRN

## 2018-04-17 MED ORDER — OXYCODONE HCL 10 MG PO TABS: ORAL_TABLET | ORAL | 0 refills | Status: DC

## 2018-04-17 NOTE — Telephone Encounter (Signed)
Requesting refill Oxycodone HCl 10 MG TABS and HYDROcodone-acetaminophen (NORCO) 10-325 MG tablet sent to [facility] on [address].

## 2018-04-18 MED FILL — ABIRATERONE ACETATE 250 MG: 250 | 30 days supply | Qty: 120 | Fill #0

## 2018-04-20 ENCOUNTER — Ambulatory Visit (INDEPENDENT_AMBULATORY_CARE_PROVIDER_SITE_OTHER): Payer: Medicare Other

## 2018-04-20 ENCOUNTER — Telehealth: Payer: Self-pay

## 2018-04-20 VITALS — BP 180/76 | HR 82 | Temp 98.5°F | Ht 70.0 in | Wt 139.2 lb

## 2018-04-20 DIAGNOSIS — Z23 Encounter for immunization: Secondary | ICD-10-CM | POA: Diagnosis not present

## 2018-04-20 DIAGNOSIS — Z0000 Encounter for general adult medical examination without abnormal findings: Secondary | ICD-10-CM

## 2018-04-20 NOTE — Patient Instructions (Addendum)
Michael Small , Thank you for taking time to come for your Medicare Wellness Visit. I appreciate your ongoing commitment to your health goals. Please review the following plan we discussed and let me know if I can assist you in the future.   Screening recommendations/referrals: Colonoscopy: Up to date, cologuard due 12/17/19 Recommended yearly ophthalmology/optometry visit for glaucoma screening and checkup Recommended yearly dental visit for hygiene and checkup  Vaccinations: Influenza vaccine: Administered today.  Pneumococcal vaccine: Not required until age 45. Tdap vaccine: Up to date, due 10/2026 Shingles vaccine: Pt declines today.     Advanced directives: Please bring a copy of your POA (Power of Attorney) and/or Living Will to your next appointment.   Conditions/risks identified: Recommend to continue increase water intake to 6-8 glasses a day.   Next appointment: 06/07/18 @ 10:00 AM with Caryn Section.  Preventive Care 40-64 Years, Male Preventive care refers to lifestyle choices and visits with your health care provider that can promote health and wellness. What does preventive care include?  A yearly physical exam. This is also called an annual well check.  Dental exams once or twice a year.  Routine eye exams. Ask your health care provider how often you should have your eyes checked.  Personal lifestyle choices, including:  Daily care of your teeth and gums.  Regular physical activity.  Eating a healthy diet.  Avoiding tobacco and drug use.  Limiting alcohol use.  Practicing safe sex.  Taking low-dose aspirin every day starting at age 78. What happens during an annual well check? The services and screenings done by your health care provider during your annual well check will depend on your age, overall health, lifestyle risk factors, and family history of disease. Counseling  Your health care provider may ask you questions about your:  Alcohol use.  Tobacco  use.  Drug use.  Emotional well-being.  Home and relationship well-being.  Sexual activity.  Eating habits.  Work and work Statistician. Screening  You may have the following tests or measurements:  Height, weight, and BMI.  Blood pressure.  Lipid and cholesterol levels. These may be checked every 5 years, or more frequently if you are over 58 years old.  Skin check.  Lung cancer screening. You may have this screening every year starting at age 48 if you have a 30-pack-year history of smoking and currently smoke or have quit within the past 15 years.  Fecal occult blood test (FOBT) of the stool. You may have this test every year starting at age 32.  Flexible sigmoidoscopy or colonoscopy. You may have a sigmoidoscopy every 5 years or a colonoscopy every 10 years starting at age 28.  Prostate cancer screening. Recommendations will vary depending on your family history and other risks.  Hepatitis C blood test.  Hepatitis B blood test.  Sexually transmitted disease (STD) testing.  Diabetes screening. This is done by checking your blood sugar (glucose) after you have not eaten for a while (fasting). You may have this done every 1-3 years. Discuss your test results, treatment options, and if necessary, the need for more tests with your health care provider. Vaccines  Your health care provider may recommend certain vaccines, such as:  Influenza vaccine. This is recommended every year.  Tetanus, diphtheria, and acellular pertussis (Tdap, Td) vaccine. You may need a Td booster every 10 years.  Zoster vaccine. You may need this after age 81.  Pneumococcal 13-valent conjugate (PCV13) vaccine. You may need this if you have certain conditions and  have not been vaccinated.  Pneumococcal polysaccharide (PPSV23) vaccine. You may need one or two doses if you smoke cigarettes or if you have certain conditions. Talk to your health care provider about which screenings and vaccines you  need and how often you need them. This information is not intended to replace advice given to you by your health care provider. Make sure you discuss any questions you have with your health care provider. Document Released: 06/12/2015 Document Revised: 02/03/2016 Document Reviewed: 03/17/2015 Elsevier Interactive Patient Education  2017 [place] Prevention in the Home Falls can cause injuries. They can happen to people of all ages. There are many things you can do to make your home safe and to help prevent falls. What can I do on the outside of my home?  Regularly fix the edges of walkways and driveways and fix any cracks.  Remove anything that might make you trip as you walk through a door, such as a raised step or threshold.  Trim any bushes or trees on the path to your home.  Use bright outdoor lighting.  Clear any walking paths of anything that might make someone trip, such as rocks or tools.  Regularly check to see if handrails are loose or broken. Make sure that both sides of any steps have handrails.  Any raised decks and porches should have guardrails on the edges.  Have any leaves, snow, or ice cleared regularly.  Use sand or salt on walking paths during winter.  Clean up any spills in your garage right away. This includes oil or grease spills. What can I do in the bathroom?  Use night lights.  Install grab bars by the toilet and in the tub and shower. Do not use towel bars as grab bars.  Use non-skid mats or decals in the tub or shower.  If you need to sit down in the shower, use a plastic, non-slip stool.  Keep the floor dry. Clean up any water that spills on the floor as soon as it happens.  Remove soap buildup in the tub or shower regularly.  Attach bath mats securely with double-sided non-slip rug tape.  Do not have throw rugs and other things on the floor that can make you trip. What can I do in the bedroom?  Use night lights.  Make sure  that you have a light by your bed that is easy to reach.  Do not use any sheets or blankets that are too big for your bed. They should not hang down onto the floor.  Have a firm chair that has side arms. You can use this for support while you get dressed.  Do not have throw rugs and other things on the floor that can make you trip. What can I do in the kitchen?  Clean up any spills right away.  Avoid walking on wet floors.  Keep items that you use a lot in easy-to-reach places.  If you need to reach something above you, use a strong step stool that has a grab bar.  Keep electrical cords out of the way.  Do not use floor polish or wax that makes floors slippery. If you must use wax, use non-skid floor wax.  Do not have throw rugs and other things on the floor that can make you trip. What can I do with my stairs?  Do not leave any items on the stairs.  Make sure that there are handrails on both sides of the stairs and  use them. Fix handrails that are broken or loose. Make sure that handrails are as long as the stairways.  Check any carpeting to make sure that it is firmly attached to the stairs. Fix any carpet that is loose or worn.  Avoid having throw rugs at the top or bottom of the stairs. If you do have throw rugs, attach them to the floor with carpet tape.  Make sure that you have a light switch at the top of the stairs and the bottom of the stairs. If you do not have them, ask someone to add them for you. What else can I do to help prevent falls?  Wear shoes that:  Do not have high heels.  Have rubber bottoms.  Are comfortable and fit you well.  Are closed at the toe. Do not wear sandals.  If you use a stepladder:  Make sure that it is fully opened. Do not climb a closed stepladder.  Make sure that both sides of the stepladder are locked into place.  Ask someone to hold it for you, if possible.  Clearly mark and make sure that you can see:  Any grab bars or  handrails.  First and last steps.  Where the edge of each step is.  Use tools that help you move around (mobility aids) if they are needed. These include:  Canes.  Walkers.  Scooters.  Crutches.  Turn on the lights when you go into a dark area. Replace any light bulbs as soon as they burn out.  Set up your furniture so you have a clear path. Avoid moving your furniture around.  If any of your floors are uneven, fix them.  If there are any pets around you, be aware of where they are.  Review your medicines with your doctor. Some medicines can make you feel dizzy. This can increase your chance of falling. Ask your doctor what other things that you can do to help prevent falls. This information is not intended to replace advice given to you by your health care provider. Make sure you discuss any questions you have with your health care provider. Document Released: 03/12/2009 Document Revised: 10/22/2015 Document Reviewed: 06/20/2014 Elsevier Interactive Patient Education  2017 [employer].

## 2018-04-20 NOTE — Telephone Encounter (Signed)
Pt's wife advised. (On DPR)  Thanks,   -Mickel Baas

## 2018-04-20 NOTE — Telephone Encounter (Signed)
No, there is no capsule or tablet, but he could reduce dose to once a day until he gets labs checked again at oncology.

## 2018-04-20 NOTE — Telephone Encounter (Signed)
Pt was seen for his AWV today and stated that he is having difficulty taking the Kayexalate. Pt states it comes in a powder form and it makes him gag when he tries to swallow it. Pt would like to know if this Rx comes in another form such as a tablet or capsule and if so can he switch to that? Please advise, thank you. -MM

## 2018-04-20 NOTE — Progress Notes (Signed)
Subjective:   Michael Small is a 62 y.o. male who presents for Medicare Annual/Subsequent preventive examination.  Review of Systems:  N/A  Cardiac Risk Factors include: advanced age (>47men, >41 women);dyslipidemia;male gender;hypertension     Objective:    Vitals: BP (!) 180/76 (BP Location: Left Arm)   Pulse 82   Temp 98.5 F (36.9 C) (Oral)   Ht 5\' 10"  (1.778 m)   Wt 139 lb 3.2 oz (63.1 kg)   BMI 19.97 kg/m   Body mass index is 19.97 kg/m.  Advanced Directives 04/20/2018 03/27/2018 03/13/2018 Aug 27, 202019 11/21/2017 10/13/2017 08/18/2017  Does Patient Have a Medical Advance Directive? Yes Yes Yes Yes Yes Yes -  Type of Advance Directive [place];Living will -  Does patient want to make changes to medical advance directive? - - - - - No - Patient declined -  Copy of [place] in Chart? No - copy requested - - - - No - copy requested -  Would patient like information on creating a medical advance directive? - - - - - No - Patient declined No - Patient declined    Tobacco Social History   Tobacco Use  Smoking Status Current Every Day Smoker  . Packs/day: 1.00  . Years: 48.00  . Pack years: 48.00  . Types: Cigars  Smokeless Tobacco Never Used  Tobacco Comment   used to smoke 2PPD- lately cut down to 1/2 PPD (cigars)     Ready to quit: Not Answered Counseling given: Not Answered Comment: used to smoke 2PPD- lately cut down to 1/2 PPD (cigars)   Clinical Intake:  Pre-visit preparation completed: Yes  Pain : No/denies pain Pain Score: 0-No pain     Nutritional Status: BMI of 19-24  Normal Nutritional Risks: None Diabetes: No  How often do you need to have someone help you when you read instructions, pamphlets, or other written materials from your doctor or pharmacy?: 1 - Never  Interpreter Needed?: No  Information entered by :: [facility], LPN  Past Medical History:  Diagnosis  Date  . Chronic kidney disease   . Depression   . GERD (gastroesophageal reflux disease)   . Hyperlipidemia   . Hypertension   . Myocardial infarction [facility])    s/p stent  . Neuromuscular disorder (HCC)    numbness in arms and legs  . Prostate cancer [facility])    Past Surgical History:  Procedure Laterality Date  . CORONARY ANGIOPLASTY WITH STENT PLACEMENT  12/2011   [facility]  . CYSTOSCOPY W/ RETROGRADES Bilateral 05/24/2017   Procedure: CYSTOSCOPY WITH RETROGRADE PYELOGRAM;  Surgeon: [name], MD;  Location: [facility] OR[facility];  Service: Urology;  Laterality: Bilateral;  . CYSTOSCOPY W/ RETROGRADES Bilateral 11/21/2017   Procedure: CYSTOSCOPY WITH RETROGRADE PYELOGRAM;  Surgeon: [name], MD;  Location: [facility];  Service: Urology;  Laterality: Bilateral;  . CYSTOSCOPY W/ RETROGRADES Bilateral 03/20/2018   Procedure: CYSTOSCOPY WITH RETROGRADE PYELOGRAM;  Surgeon: [name], MD;  Location: [facility];  Service: Urology;  Laterality: Bilateral;  . CYSTOSCOPY W/ URETERAL STENT PLACEMENT Bilateral 11/21/2017   Procedure: CYSTOSCOPY WITH STENT REPLACEMENT;  Surgeon: [name], MD;  Location: [facility];  Service: Urology;  Laterality: Bilateral;  . CYSTOSCOPY W/ URETERAL STENT REMOVAL Bilateral 11/21/2017   Procedure: CYSTOSCOPY WITH STENT REMOVAL;  Surgeon: [name], MD;  Location: [facility];  Service: Urology;  Laterality: Bilateral;  . CYSTOSCOPY  WITH STENT PLACEMENT Right 05/24/2017   Procedure: CYSTOSCOPY WITH STENT PLACEMENT;  Surgeon: [name], MD;  Location: [facility];  Service: Urology;  Laterality: Right;  . CYSTOSCOPY WITH STENT PLACEMENT Bilateral 03/20/2018   Procedure: CYSTOSCOPY WITH STENT EXCHANGE;  Surgeon: [name], MD;  Location: [facility];  Service: Urology;  Laterality: Bilateral;  . IR NEPHROSTOGRAM LEFT THRU EXISTING ACCESS  07/21/2017  . IR NEPHROSTOGRAM LEFT THRU EXISTING ACCESS  08/18/2017  . IR NEPHROSTOGRAM RIGHT THRU  EXISTING ACCESS  08/18/2017  . IR NEPHROSTOMY PLACEMENT LEFT  06/15/2017  . IR NEPHROSTOMY PLACEMENT RIGHT  06/14/2017  . IR URETERAL STENT PLACEMENT EXISTING ACCESS LEFT  07/21/2017  . PICC LINE INSERTION N/A 08/02/2017   Procedure: PICC LINE INSERTION;  Surgeon: Katha Cabal, MD;  Location: [facility];  Service: Cardiovascular;  Laterality: N/A;  . PROSTATE BIOPSY N/A 05/24/2017   Procedure: PROSTATE BIOPSY;  Surgeon: [name], MD;  Location: [facility];  Service: Urology;  Laterality: N/A;  . TEE WITHOUT CARDIOVERSION N/A 07/31/2017   Procedure: TRANSESOPHAGEAL ECHOCARDIOGRAM (TEE);  Surgeon: Teodoro Spray, MD;  Location: [facility];  Service: Cardiovascular;  Laterality: N/A;  . US ECHOCARDIOGRAPHY  12/2011   Mild LV dysfunction, EF=45%   Family History  Problem Relation Age of Onset  . Cancer Mother   . Hypertension Mother   . Diabetes Father   . Heart disease Father   . Cancer Paternal Grandfather   . Prostate cancer Neg Hx   . Bladder Cancer Neg Hx   . Kidney cancer Neg Hx    Social History   Socioeconomic History  . Marital status: Married    Spouse name: Not on file  . Number of children: 0  . Years of education: Not on file  . Highest education level: 9th grade  Occupational History  . Occupation: Disabled   Social Needs  . Financial resource strain: Not hard at all  . Food insecurity:    Worry: Never true    Inability: Never true  . Transportation needs:    Medical: No    Non-medical: No  Tobacco Use  . Smoking status: Current Every Day Smoker    Packs/day: 1.00    Years: 48.00    Pack years: 48.00    Types: Cigars  . Smokeless tobacco: Never Used  . Tobacco comment: used to smoke 2PPD- lately cut down to 1/2 PPD (cigars)  Substance and Sexual Activity  . 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 Drug use: No  . Sexual activity: Not Currently  Lifestyle  .  Physical activity:    Days per week: 0 days    Minutes per session: 0 min  . Stress: Not at all  Relationships  . Social connections:    Talks on phone: Patient refused    Gets together: Patient refused    Attends religious service: Patient refused    Active member of club or organization: Patient refused    Attends meetings of clubs or organizations: Patient refused    Relationship status: Patient refused  Other Topics Concern  . Not on file  Social History Narrative   Used to be in Architect, very independent but lately over the past few months- due to weakness, limited activity only.    Outpatient Encounter Medications as of 04/20/2018  Medication Sig  . abiraterone acetate (  ZYTIGA) 250 MG tablet Take 4 tablets (1,000 mg total) by mouth daily. Take on an empty stomach 1 hour before or 2 hours after a meal  . buPROPion (WELLBUTRIN SR) 150 MG 12 hr tablet Take 1 tablet (150 mg total) by mouth 2 (two) times daily.  . diphenoxylate-atropine (LOMOTIL) 2.5-0.025 MG tablet Take 1 tablet by mouth 4 (four) times daily as needed for diarrhea or loose stools.  . feeding supplement, ENSURE ENLIVE, (ENSURE ENLIVE) LIQD Take 237 mLs by mouth 3 (three) times daily between meals.  . furosemide (LASIX) 20 MG tablet Take 1 tablet (20 mg total) by mouth daily as needed for fluid. (Patient taking differently: Take 20 mg by mouth daily. )  . HYDROcodone-acetaminophen (NORCO) 10-325 MG tablet Take 1 tablet by mouth every 3 (three) hours as needed. For cancer related pain Dx: C61  . ondansetron (ZOFRAN) 8 MG tablet TAKE 1 TABLET BY MOUTH EVERY 8 HOURS AS NEEDED FOR NAUSEA (Patient taking differently: Take 8 mg by mouth every 8 (eight) hours as needed for nausea or vomiting. )  . Oxycodone HCl 10 MG TABS One tablet every six hours scheduled, and one every three hours as needed for breakthrough pain  . predniSONE (DELTASONE) 5 MG tablet Take 1 tablet (5 mg total) by mouth 2 (two) times daily with a meal.    . sodium polystyrene (KAYEXALATE) powder Take 15mg  twice a day   No facility-administered encounter medications on file as of 04/20/2018.     Activities of Daily Living In your present state of health, do you have any difficulty performing the following activities: 04/20/2018 03/13/2018  Hearing? N N  Vision? N N  Comment Wears eye glasses for up close vision.  -  Difficulty concentrating or making decisions? Y N  Walking or climbing stairs? N N  Dressing or bathing? N N  Doing errands, shopping? N N  Preparing Food and eating ? N -  Using the Toilet? N -  In the past six months, have you accidently leaked urine? N -  Do you have problems with loss of bowel control? N -  Managing your Medications? N -  Managing your Finances? N -  Housekeeping or managing your Housekeeping? N -  Some recent data might be hidden    Patient Care Team: Birdie Sons, MD as PCP - General (Family Medicine) Quay Burow Wandra Feinstein, NP as Nurse Practitioner (Oncology) [name], MD (Urology) Nickie Retort, MD as Consulting Physician (Urology) Lequita Asal, MD as Referring Physician (Hematology and Oncology)   Assessment:   This is a routine wellness examination for Dominyk.  Exercise Activities and Dietary recommendations Current Exercise Habits: The patient does not participate in regular exercise at present, Exercise limited by: Other - see comments(currently on chemo)  Goals    . DIET - INCREASE WATER INTAKE     Recommend increasing water intake to 3-4 glasses a day.   04/20/18- Recommend to continue increase water intake to 6-8 glasses a day.        Fall Risk Fall Risk  04/20/2018 04/17/2017  Falls in the past year? 0 No   FALL RISK PREVENTION PERTAINING TO THE HOME:  Any stairs in or around the home WITH handrails? No  Home free of loose throw rugs in walkways, pet beds, electrical cords, etc? Yes  Adequate lighting in your home to reduce risk of falls? Yes    ASSISTIVE DEVICES UTILIZED TO PREVENT FALLS:  Life alert? No  Use  of a cane, walker or w/c? No  Grab bars in the bathroom? Yes  Shower chair or bench in shower? Yes  Elevated toilet seat or a handicapped toilet? Yes    TIMED UP AND GO:  Was the test performed? No .    Depression Screen PHQ 2/9 Scores 04/20/2018 04/17/2017  PHQ - 2 Score 0 1    Cognitive Function: Declined today.         Immunization History  Administered Date(s) Administered  . Influenza,inj,Quad PF,6+ Mos 05/12/2015, 04/17/2017  . Pneumococcal Polysaccharide-23 02/08/2012  . Tdap 11/15/2016    Qualifies for Shingles Vaccine? Yes . Due for Shingrix. Education has been provided regarding the importance of this vaccine. Pt has been advised to call insurance company to determine out of pocket expense. Advised may also receive vaccine at local pharmacy or Health Dept. Verbalized acceptance and understanding.  Tdap: Up to date  Flu Vaccine: Up to date    Screening Tests Health Maintenance  Topic Date Due  . Hepatitis C Screening  May 09, 1956  . Fecal DNA (Cologuard)  12/17/2019  . TETANUS/TDAP  11/16/2026  . INFLUENZA VACCINE  Completed  . HIV Screening  Completed   Cancer Screenings:  Colorectal Screening: Cologuard completed 12/16/16. Repeat every 3 years.  Lung Cancer Screening: (Low Dose CT Chest recommended if Age 71-80 years, 30 pack-year currently smoking OR have quit w/in 15years.) does not qualify.   Additional Screening:  Hepatitis C Screening: does qualify however pt declined today.   Vision Screening: Recommended annual ophthalmology exams for early detection of glaucoma and other disorders of the eye.  Dental Screening: Recommended annual dental exams for proper oral hygiene  Community Resource Referral:  CRR required this visit?  No        Plan:  I have personally reviewed and addressed the Medicare Annual Wellness questionnaire and have noted the following in the  patient's chart:  A. Medical and social history B. Use of alcohol, tobacco or illicit drugs  C. Current medications and supplements D. Functional ability and status E.  Nutritional status F.  Physical activity G. Advance directives H. List of other physicians I.  Hospitalizations, surgeries, and ER visits in previous 12 months J.  [place] such as hearing and vision if needed, cognitive and depression L. Referrals and appointments - none  In addition, I have reviewed and discussed with patient certain preventive protocols, quality metrics, and best practice recommendations. A written personalized care plan for preventive services as well as general preventive health recommendations were provided to patient.  See attached scanned questionnaire for additional information.   Signed,  Fabio Neighbors, LPN Nurse Health Advisor   Nurse Recommendations: Pt declined the Hep C lab order today. B/p was elevated today. Pt states it normally is that high when he comes in the clinic. Pt declined chest pain, SOB, dizziness and any arm pain or numbness. Asked pt to check his B/ readings daily and keep a log, then call the office if he notices a continuation of high readings at home. Pt stated understanding.

## 2018-04-24 ENCOUNTER — Other Ambulatory Visit: Payer: Self-pay

## 2018-04-24 ENCOUNTER — Encounter: Payer: Self-pay | Admitting: Hematology and Oncology

## 2018-04-24 ENCOUNTER — Inpatient Hospital Stay (HOSPITAL_BASED_OUTPATIENT_CLINIC_OR_DEPARTMENT_OTHER): Payer: Medicare Other | Admitting: Hematology and Oncology

## 2018-04-24 ENCOUNTER — Inpatient Hospital Stay: Payer: Medicare Other

## 2018-04-24 VITALS — BP 192/84 | HR 76 | Temp 96.8°F | Resp 18 | Wt 146.4 lb

## 2018-04-24 DIAGNOSIS — C7951 Secondary malignant neoplasm of bone: Secondary | ICD-10-CM

## 2018-04-24 DIAGNOSIS — E875 Hyperkalemia: Secondary | ICD-10-CM

## 2018-04-24 DIAGNOSIS — Z7189 Other specified counseling: Secondary | ICD-10-CM

## 2018-04-24 DIAGNOSIS — Z79899 Other long term (current) drug therapy: Secondary | ICD-10-CM | POA: Diagnosis not present

## 2018-04-24 DIAGNOSIS — C61 Malignant neoplasm of prostate: Secondary | ICD-10-CM

## 2018-04-24 DIAGNOSIS — N289 Disorder of kidney and ureter, unspecified: Secondary | ICD-10-CM

## 2018-04-24 DIAGNOSIS — I10 Essential (primary) hypertension: Secondary | ICD-10-CM | POA: Diagnosis not present

## 2018-04-24 LAB — CBC WITH DIFFERENTIAL/PLATELET
Abs Immature Granulocytes: 0.02 10*3/uL (ref 0.00–0.07)
Basophils Absolute: 0 10*3/uL (ref 0.0–0.1)
Basophils Relative: 1 %
Eosinophils Absolute: 0.1 10*3/uL (ref 0.0–0.5)
Eosinophils Relative: 2 %
HCT: 38.1 % — ABNORMAL LOW (ref 39.0–52.0)
Hemoglobin: 12.1 g/dL — ABNORMAL LOW (ref 13.0–17.0)
Immature Granulocytes: 0 %
Lymphocytes Relative: 19 %
Lymphs Abs: 1.3 10*3/uL (ref 0.7–4.0)
MCH: 30.1 pg (ref 26.0–34.0)
MCHC: 31.8 g/dL (ref 30.0–36.0)
MCV: 94.8 fL (ref 80.0–100.0)
Monocytes Absolute: 0.5 10*3/uL (ref 0.1–1.0)
Monocytes Relative: 8 %
Neutro Abs: 4.8 10*3/uL (ref 1.7–7.7)
Neutrophils Relative %: 70 %
Platelets: 200 10*3/uL (ref 150–400)
RBC: 4.02 MIL/uL — ABNORMAL LOW (ref 4.22–5.81)
RDW: 16.4 % — ABNORMAL HIGH (ref 11.5–15.5)
WBC: 6.8 10*3/uL (ref 4.0–10.5)
nRBC: 0 % (ref 0.0–0.2)

## 2018-04-24 LAB — COMPREHENSIVE METABOLIC PANEL
ALT: 11 U/L (ref 0–44)
AST: 15 U/L (ref 15–41)
Albumin: 3.7 g/dL (ref 3.5–5.0)
Alkaline Phosphatase: 265 U/L — ABNORMAL HIGH (ref 38–126)
Anion gap: 4 — ABNORMAL LOW (ref 5–15)
BUN: 27 mg/dL — ABNORMAL HIGH (ref 8–23)
CO2: 25 mmol/L (ref 22–32)
Calcium: 9.2 mg/dL (ref 8.9–10.3)
Chloride: 109 mmol/L (ref 98–111)
Creatinine, Ser: 1.92 mg/dL — ABNORMAL HIGH (ref 0.61–1.24)
GFR calc Af Amer: 42 mL/min — ABNORMAL LOW (ref >60)
GFR calc non Af Amer: 36 mL/min — ABNORMAL LOW (ref >60)
Glucose, Bld: 100 mg/dL — ABNORMAL HIGH (ref 70–99)
Potassium: 4.8 mmol/L (ref 3.5–5.1)
Sodium: 138 mmol/L (ref 135–145)
Total Bilirubin: 0.4 mg/dL (ref 0.3–1.2)
Total Protein: 6.6 g/dL (ref 6.5–8.1)

## 2018-04-24 LAB — PSA: Prostatic Specific Antigen: 107.14 ng/mL — ABNORMAL HIGH (ref 0.00–4.00)

## 2018-04-24 NOTE — Progress Notes (Signed)
Here for follow up. " I feel pretty good today " stated gags on Kayexalate -hoping for an alternative med-not taking daily as ordered.  No pain this am-took pain meds . When he has pain -in lower back level 6 daily -pain meds help he stated. Feeling weak on and off he stated w less energy.

## 2018-04-24 NOTE — Progress Notes (Signed)
[facility] day:  04/24/2018   Chief Complaint: Michael Small is a 62 y.o. male with metastatic prostate cancer who is seen for assessment after reinitiation of abiraterone and continuation of Lupron.  HPI:   The patient was last seen in the medical oncology clinic on 03/27/2018.   At that time, he was feeling better today. Nausea and diarrhea had resolved. Patient's disposition had improved; he was smiling and more talkative.  Energy level was stable. Exam was grossly unchanged from previous.  WBC was 7100 ([place] 3600.  Hemoglobin was 12.8.  Potassium was 5.6 mmol/L.  BUN 37 and creatinine 2.08 (CrCl 30.1 mL/min).  AST was 13, ALT 9, ALP 258, and total bilirubin 0.4.  He received Lupron.  He was taking 2 abiraterone pills/day.  He was increasing by 1 pill/week to a goal of 4 pills/week.  LFTs on 04/10/2018 revealed an AST of 11, ALT 9, bilirubin 0.3, and an alkaline phosphatase of 232.  During the interim, patient is doing well. He has up titrated his abiraterone dose up to the intended 4 pills/day. He denies any nausea, vomiting, and changes to his bowel habits. Patient is struggling with the SPS liquid prescribed by PCP for his HYPERkalemia. Patient states, "I don't take it like they told me. It tastes like dirt, and it makes me gag".   Patient denies that he has experienced any B symptoms. He denies any interval infections. Patient advises that he maintains an adequate appetite. He is eating well. Weight today is 146 lb 6.4 oz (66.4 kg), which compared to his last visit to the clinic, represents a 7 pound increase.    Patient denies pain in the clinic today.   Past Medical History:  Diagnosis Date  . Chronic kidney disease   . Depression   . GERD (gastroesophageal reflux disease)   . Hyperlipidemia   . Hypertension   . Myocardial infarction [facility])    s/p stent  . Neuromuscular disorder (HCC)    numbness in arms and legs  . Prostate cancer  [facility])     Past Surgical History:  Procedure Laterality Date  . CORONARY ANGIOPLASTY WITH STENT PLACEMENT  12/2011   [facility]  . CYSTOSCOPY W/ RETROGRADES Bilateral 05/24/2017   Procedure: CYSTOSCOPY WITH RETROGRADE PYELOGRAM;  Surgeon: [name], MD;  Location: [facility] ORS;  Service: Urology;  Laterality: Bilateral;  . CYSTOSCOPY W/ RETROGRADES Bilateral 11/21/2017   Procedure: CYSTOSCOPY WITH RETROGRADE PYELOGRAM;  Surgeon: [name], MD;  Location: [facility] ORS;  Service: Urology;  Laterality: Bilateral;  . CYSTOSCOPY W/ RETROGRADES Bilateral 03/20/2018   Procedure: CYSTOSCOPY WITH RETROGRADE PYELOGRAM;  Surgeon: [name], MD;  Location: [facility] ORS;  Service: Urology;  Laterality: Bilateral;  . CYSTOSCOPY W/ URETERAL STENT PLACEMENT Bilateral 11/21/2017   Procedure: CYSTOSCOPY WITH STENT REPLACEMENT;  Surgeon: [name], MD;  Location: [facility] OR[facility];  Service: Urology;  Laterality: Bilateral;  . CYSTOSCOPY W/ URETERAL STENT REMOVAL Bilateral 11/21/2017   Procedure: CYSTOSCOPY WITH STENT REMOVAL;  Surgeon: [name], MD;  Location: [facility] OR[facility];  Service: Urology;  Laterality: Bilateral;  . CYSTOSCOPY WITH STENT PLACEMENT Right 05/24/2017   Procedure: CYSTOSCOPY WITH STENT PLACEMENT;  Surgeon: [name], MD;  Location: [facility];  Service: Urology;  Laterality: Right;  . CYSTOSCOPY WITH STENT PLACEMENT Bilateral 03/20/2018   Procedure: CYSTOSCOPY WITH STENT EXCHANGE;  Surgeon: [name], MD;  Location: [facility] OR[facility];  Service: Urology;  Laterality:  Bilateral;  . IR NEPHROSTOGRAM LEFT THRU EXISTING ACCESS  07/21/2017  . IR NEPHROSTOGRAM LEFT THRU EXISTING ACCESS  08/18/2017  . IR NEPHROSTOGRAM RIGHT THRU EXISTING ACCESS  08/18/2017  . IR NEPHROSTOMY PLACEMENT LEFT  06/15/2017  . IR NEPHROSTOMY PLACEMENT RIGHT  06/14/2017  . IR URETERAL STENT PLACEMENT EXISTING ACCESS LEFT  07/21/2017  . PICC LINE INSERTION N/A 08/02/2017   Procedure: PICC LINE INSERTION;   Surgeon: Katha Cabal, MD;  Location: [facility];  Service: Cardiovascular;  Laterality: N/A;  . PROSTATE BIOPSY N/A 05/24/2017   Procedure: PROSTATE BIOPSY;  Surgeon: [name], MD;  Location: [facility] OR[facility];  Service: Urology;  Laterality: N/A;  . TEE WITHOUT CARDIOVERSION N/A 07/31/2017   Procedure: TRANSESOPHAGEAL ECHOCARDIOGRAM (TEE);  Surgeon: Teodoro Spray, MD;  Location: [facility];  Service: Cardiovascular;  Laterality: N/A;  . US ECHOCARDIOGRAPHY  12/2011   Mild LV dysfunction, EF=45%    Family History  Problem Relation Age of Onset  . Cancer Mother   . Hypertension Mother   . Diabetes Father   . Heart disease Father   . Cancer Paternal Grandfather   . Prostate cancer Neg Hx   . Bladder Cancer Neg Hx   . Kidney cancer Neg Hx     Social History:  reports that he has been smoking cigars. He has a 48.00 pack-year smoking history. He has never used smokeless tobacco. He reports that he does not drink alcohol or use drugs.  He has smoked 1 pack per day since age 79 (56 years).  He smokes small cigars.  He does not drink alcohol, but previously drank heavily.  He describes being exposed to Round Up and asbestos.  He has been disabled for several years secondary to "nerve damage and problems in his legs, arms, and bad back".  He fell off a ladder.  He previously worked for a [employer], [employer].  He lives in [place].  He has step children.  The patient is accompanied by his wife today.  Allergies:  Allergies  Allergen Reactions  . Zytiga [Abiraterone] Diarrhea    Current Medications: Current Outpatient Medications  Medication Sig Dispense Refill  . abiraterone acetate (ZYTIGA) 250 MG tablet Take 4 tablets (1,000 mg total) by mouth daily. Take on an empty stomach 1 hour before or 2 hours after a meal 120 tablet 0  . buPROPion (WELLBUTRIN SR) 150 MG 12 hr tablet Take 1 tablet (150 mg total) by mouth 2 (two) times daily. 180 tablet 4  . diphenoxylate-atropine  (LOMOTIL) 2.5-0.025 MG tablet Take 1 tablet by mouth 4 (four) times daily as needed for diarrhea or loose stools. 120 tablet 3  . furosemide (LASIX) 20 MG tablet Take 1 tablet (20 mg total) by mouth daily as needed for fluid. (Patient taking differently: Take 20 mg by mouth daily. ) 30 tablet 2  . HYDROcodone-acetaminophen (NORCO) 10-325 MG tablet Take 1 tablet by mouth every 3 (three) hours as needed. For cancer related pain Dx: C61 120 tablet 0  . Oxycodone HCl 10 MG TABS One tablet every six hours scheduled, and one every three hours as needed for breakthrough pain 120 tablet 0  . predniSONE (DELTASONE) 5 MG tablet Take 1 tablet (5 mg total) by mouth 2 (two) times daily with a meal. 60 tablet 2  . feeding supplement, ENSURE ENLIVE, (ENSURE ENLIVE) LIQD Take 237 mLs by mouth 3 (three) times daily between meals. (Patient not taking: Reported on 04/24/2018) 237 mL 12  . ondansetron (  ZOFRAN) 8 MG tablet TAKE 1 TABLET BY MOUTH EVERY 8 HOURS AS NEEDED FOR NAUSEA (Patient not taking: No sig reported) 30 tablet 5  . sodium polystyrene (KAYEXALATE) powder Take 15mg  twice a day (Patient not taking: Reported on 04/24/2018) 454 g 0   No current facility-administered medications for this visit.     Review of Systems  Constitutional: Negative.  Negative for chills, diaphoresis, fever, malaise/fatigue and weight loss (up 7 pounds).       Feels "good".  HENT: Negative.  Negative for congestion, ear discharge, ear pain, nosebleeds, sinus pain, sore throat and tinnitus.   Eyes: Negative.  Negative for blurred vision, double vision, photophobia, pain, discharge and redness.  Respiratory: Positive for shortness of breath (exertional). Negative for cough and hemoptysis.   Cardiovascular: Negative for chest pain, palpitations, orthopnea, leg swelling and PND.       History of a myocardial infarction.  Gastrointestinal: Negative for abdominal pain, blood in stool, constipation, diarrhea, heartburn, melena, nausea  and vomiting.  Genitourinary: Negative.  Negative for dysuria, frequency, hematuria and urgency.       Nephrostomy tubes.  Musculoskeletal: Positive for back pain (sacral pain). Negative for falls, joint pain, myalgias and neck pain.  Skin: Negative.  Negative for itching and rash.  Neurological: Negative.  Negative for dizziness, tingling, tremors, sensory change, speech change, focal weakness, seizures, weakness and headaches.  Endo/Heme/Allergies: Bruises/bleeds easily.  Psychiatric/Behavioral: Negative for depression and memory loss. The patient is not nervous/anxious and does not have insomnia.   All other systems reviewed and are negative.  Performance status (ECOG): 2 Vital Signs: BP (!) 192/84 Comment: 196/81 1st reading   Pulse 76   Temp (!) 96.8 F (36 C)   Resp 18   Wt 146 lb 6.4 oz (66.4 kg)   BMI 21.01 kg/m   Physical Exam  Constitutional: He is oriented to person, place, and time. Cachectic: (+) temporal wasting. No distress.  Chronically fatigued appearing thin gentleman sitting comfortably in the exam room in no acute distress.  HENT:  Head: Normocephalic and atraumatic.  Mouth/Throat: Oropharynx is clear and moist and mucous membranes are normal.  Gray hair.  Edentulous.  Eyes: Pupils are equal, round, and reactive to light. Conjunctivae and EOM are normal. No scleral icterus.  Blue eyes.  Neck: Normal range of motion. Neck supple. No JVD present.  Cardiovascular: Normal rate, regular rhythm, normal heart sounds and intact distal pulses. Exam reveals no gallop and no friction rub.  No murmur heard. Pulmonary/Chest: Effort normal and breath sounds normal. No respiratory distress. He has no wheezes. He has no rales.  Abdominal: Soft. Bowel sounds are normal. He exhibits no distension and no mass. There is no abdominal tenderness. There is no rebound and no guarding.  Musculoskeletal:        General: No tenderness or edema.  Lymphadenopathy:    He has no cervical  adenopathy.    He has no axillary adenopathy.       Right: No inguinal and no supraclavicular adenopathy present.       Left: No inguinal and no supraclavicular adenopathy present.  Neurological: He is alert and oriented to person, place, and time. Gait normal.  Skin: Skin is warm and dry. No rash noted. He is not diaphoretic. No erythema. No pallor.  Psychiatric: Mood, affect and judgment normal.  Smiling.  Interactive.  Nursing note and vitals reviewed.   Appointment on 04/24/2018  Component Date Value Ref Range Status  . Sodium 04/24/2018 138  135 - 145 mmol/L Final  . Potassium 04/24/2018 4.8  3.5 - 5.1 mmol/L Final  . Chloride 04/24/2018 109  98 - 111 mmol/L Final  . CO2 04/24/2018 25  22 - 32 mmol/L Final  . Glucose, Bld 04/24/2018 100* 70 - 99 mg/dL Final  . BUN 04/24/2018 27* 8 - 23 mg/dL Final  . Creatinine, Ser 04/24/2018 1.92* 0.61 - 1.24 mg/dL Final  . Calcium 04/24/2018 9.2  8.9 - 10.3 mg/dL Final  . Total Protein 04/24/2018 6.6  6.5 - 8.1 g/dL Final  . Albumin 04/24/2018 3.7  3.5 - 5.0 g/dL Final  . AST 04/24/2018 15  15 - 41 U/L Final  . ALT 04/24/2018 11  0 - 44 U/L Final  . Alkaline Phosphatase 04/24/2018 265* 38 - 126 U/L Final  . Total Bilirubin 04/24/2018 0.4  0.3 - 1.2 mg/dL Final  . GFR calc non Af Amer 04/24/2018 36* >60 mL/min Final  . GFR calc Af Amer 04/24/2018 42* >60 mL/min Final  . Anion gap 04/24/2018 4* 5 - 15 Final   Performed at [facility], [address], [place], [place] [address]  . WBC 04/24/2018 6.8  4.0 - 10.5 K/uL Final  . RBC 04/24/2018 4.02* 4.22 - 5.81 MIL/uL Final  . Hemoglobin 04/24/2018 12.1* 13.0 - 17.0 g/dL Final  . HCT 04/24/2018 38.1* 39.0 - 52.0 % Final  . MCV 04/24/2018 94.8  80.0 - 100.0 fL Final  . MCH 04/24/2018 30.1  26.0 - 34.0 pg Final  . MCHC 04/24/2018 31.8  30.0 - 36.0 g/dL Final  . RDW 04/24/2018 16.4* 11.5 - 15.5 % Final  . Platelets 04/24/2018 200  150 - 400 K/uL Final  . nRBC 04/24/2018 0.0  0.0 - 0.2 %  Final  . Neutrophils Relative % 04/24/2018 70  % Final  . Neutro Abs 04/24/2018 4.8  1.7 - 7.7 K/uL Final  . Lymphocytes Relative 04/24/2018 19  % Final  . Lymphs Abs 04/24/2018 1.3  0.7 - 4.0 K/uL Final  . Monocytes Relative 04/24/2018 8  % Final  . Monocytes Absolute 04/24/2018 0.5  0.1 - 1.0 K/uL Final  . Eosinophils Relative 04/24/2018 2  % Final  . Eosinophils Absolute 04/24/2018 0.1  0.0 - 0.5 K/uL Final  . Basophils Relative 04/24/2018 1  % Final  . Basophils Absolute 04/24/2018 0.0  0.0 - 0.1 K/uL Final  . Immature Granulocytes 04/24/2018 0  % Final  . Abs Immature Granulocytes 04/24/2018 0.02  0.00 - 0.07 K/uL Final   Performed at [facility], [address] [place],  [address]    Assessment:  Michael Small is a 62 y.o. male with metastatic prostate cancer s/p transrectal prostate biopsies and cystoscopy on 05/24/2017.  Prostate biopsies revealed acinar adenocarcinoma involving all 12 cores.  Adenocarcinoma involved colonic mucosa and skeletal muscle.  Gleason score was 9 (4+5), grade group 5.  Pathologic stage was T4N1M1c.  PSA was 2338.8 on 04/24/2017.  Abdomen and pelvic CT on 04/27/2017 revealed prominent pelvic adenopathy with pathologic retroperitoneal adenopathy, abnormal retroperitoneal stranding, and abnormal inflammatory type stranding along the pelvic sidewalls and perirectal space.  A representative right external iliac lymph node measured 3.2 cm.  A left external iliac lymph node measured 2.1 cm.  There was conglomerate left periaortic adenopathy 2.1 cm and a 1.4 cm aortocaval node.   There was abnormal adenopathy in the perirectal space with prominent wall thickening in the rectum and moderate wall thickening in the sigmoid colon. The  findings along the pelvic sidewalls appeared to be causing distal ureteral obstruction resulting in moderate bilateral hydronephrosis and hydroureter.  Lymphoma or prostate cancer was favored. A right external iliac node  measured 3.2 cm.  There was bladder wall thickening suggesting cystitis.  There was prostatomegaly.  Bone scan on 06/22/2017 revealed findings consistent with diffuse metastatic disease.  There was activity in right temporal area, sternum, anterior and posterior ribs bilaterally, and entire spine.  Non-contrast chest CT on 06/27/2017 revealed right infrahilar prominence, progressed from prior CT, worrisome for a node/nodule, but poorly visualized given the lack of IV contrast.  There was mid thoracic lymphadenopathy, progressed, including a dominant 14 mm short axis node at the left thoracic inlet.  There was stable 19 x 9 mm irregular nodule at the right lung apex.  There was a sclerotic osseous metastases at T8-9.  Bone scan on 03/09/2018 revealed widespread osseous metastatic disease with super scan appearance, progressive since prior exam.  Abdomen and pelvis CT on 03/14/2018 revealed interval progression of widespread sclerotic bony metastatic involvement with new areas of lucency in the axial skeleton and bony Pelvis.  There was evidence of interval renal atrophy, right greater than left. There was mild persistent right hydronephrosis.  There was interval removal of percutaneous nephrostomy catheters with internal ureteral stents visualized bilaterally in situ.  There was mild lymphadenopathy in the pelvic sidewall bilaterally.  There was diffuse body wall, retroperitoneal, and mesenteric edema.  There was persistent circumferential wall thickening in the distal rectum.  He received Lupron 22.5 mg every 3 months (06/01/2017; 10/02/2017; last 03/27/2018) and Degarelix 240 mg on 06/15/2017.  He received Casodex from 06/13/2017 - 06/15/2017.    He began abiraterone on 02/28/2018 and discontinued after 2 days secondary to nausea, vomiting, diarrhea, and fatigue.  He restarted abiraterone (1 pill/day) on 03/16/2018 and increased to 2 pills/day on 03/23/2018.  He has been on 4 pills/week x > 2 weeks.   Testosterone was 98 on 06/14/2017, < 3 on 09/07/2017, and < 3 on 03/16/2018.  PSA has been followed:  2338.8 on 04/24/2017, 3603.0 on 06/23/2017, 1035 on 07/26/2017, 241 on 09/07/2017, 342 on 01/23/2018, 799 on 12-12-2017, and 107.14 on 04/24/2018.  He was admitted to [facility] from 06/12/2017 - 06/15/2017 with hyperkalemia and acute renal failure.  Creatinine was 7.23.  He underwent emergent dialysis x1. He is s/p bilateral external nephrostomy tube placement on 06/14/2017.  He was admitted to [facility] from 07/28/2017 - 07/31/2017 with sepsis secondary to UTI with MSSA bacteremia.    Bilateral lower extremity duplex on 06/12/2017 revealed no evidence of DVT.   Code status is DNR/DNI.  Symptomatically, he is doing well.  He has gained weight.  Pain is controlled.  Exam is stable.  Plan: 1. Labs today:  CBC with diff, CMP, PSA. 2. Metastatic prostate cancer  Tolerating 4 pills of abiraterone x > 2 weeks.  Continue every 2 week LFT check x 3 months then monthly.  Continue prednisone.  Clinically doing better.    PSA dramatically improved.  Continues on Lupron injections every 3 months.   3. Bone metastasis  Continue to hold Xgeva secondary to renal insufficiency risk for hypocalcemia. 4. Renal insufficiency  Chronic renal insufficiency.  S/p bilateral nephrostomy tubes. 5. HYPERkalemia  Potassium 4.8.  SPS liquid prescribed by PCP for his HYPERkalemia 6. RTC in 2 weeks for labs (CBC with diff, CMP). 7. RTC in 4 weeks for MD assessment and abs (CBC with diff, CMP, PSA).   Honor Loh,  NP  04/24/2018, 11:49 AM  I saw and evaluated the patient, participating in the key portions of the service and reviewing pertinent diagnostic studies and records.  I reviewed the nurse practitioner's note and agree with the findings and the plan.  The assessment and plan were discussed with the patient.  Several questions were asked by the patient and answered.   Nolon Stalls,  MD 04/24/2018,11:49 AM

## 2018-04-25 ENCOUNTER — Encounter: Payer: Self-pay | Admitting: Physician Assistant

## 2018-04-25 ENCOUNTER — Telehealth: Payer: Self-pay

## 2018-04-25 ENCOUNTER — Ambulatory Visit (INDEPENDENT_AMBULATORY_CARE_PROVIDER_SITE_OTHER): Payer: Medicare Other | Admitting: Physician Assistant

## 2018-04-25 ENCOUNTER — Telehealth: Payer: Self-pay | Admitting: Hematology and Oncology

## 2018-04-25 ENCOUNTER — Other Ambulatory Visit: Payer: Self-pay

## 2018-04-25 VITALS — BP 190/88 | HR 61 | Temp 98.4°F | Ht 71.0 in | Wt 145.6 lb

## 2018-04-25 DIAGNOSIS — C61 Malignant neoplasm of prostate: Secondary | ICD-10-CM

## 2018-04-25 DIAGNOSIS — I10 Essential (primary) hypertension: Secondary | ICD-10-CM

## 2018-04-25 MED ORDER — AMLODIPINE BESYLATE 5 MG PO TABS: 5.0000 mg | ORAL_TABLET | Freq: Every day | ORAL | 0 refills | Status: DC

## 2018-04-25 NOTE — Progress Notes (Signed)
Patient: Michael Small Male    DOB: 07/29/1955   62 y.o.   MRN: [id number] Visit Date: 04/25/2018  Today's Provider: Trinna Post, PA-C   Chief Complaint  Patient presents with  . Hypertension   Subjective:    Hypertension  This is a new problem. The current episode started 1 to 4 weeks ago. The problem has been gradually worsening since onset. The problem is uncontrolled. Associated symptoms include headaches. Risk factors for coronary artery disease include smoking/tobacco exposure. Past treatments include nothing.   Patient has a history of prostate cancer with bone metastases and many complications arising from this in addition to hypertension. Patient presents today for follow up on blood pressure.  BP running from the 170's to 190 range. Pt is currently not taking any medication for his BP. He was previously taking losartan-hctz but this was stopped after AKI. He also was previously on amlodipine 5 mg which he reports he is not taking. Says he was told to stop this. Has a mild headache, not the worst of his life. Denies chest pain, SOB, blurred vision.   BP Readings from Last 10 Encounters:  04/25/18 (!) 190/88  04/24/18 (!) 192/84  04/20/18 (!) 180/76  03/27/18 (!) 177/75  03/20/18 (!) 166/77  03/16/18 (!) 184/80  03/13/18 (!) 170/76  02/26/18 (!) 171/78  02/07/18 (!) 180/72  01/23/18 (!) 181/77       Allergies  Allergen Reactions  . Zytiga [Abiraterone] Diarrhea     Current Outpatient Medications:  .  abiraterone acetate (ZYTIGA) 250 MG tablet, Take 4 tablets (1,000 mg total) by mouth daily. Take on an empty stomach 1 hour before or 2 hours after a meal, Disp: 120 tablet, Rfl: 0 .  buPROPion (WELLBUTRIN SR) 150 MG 12 hr tablet, Take 1 tablet (150 mg total) by mouth 2 (two) times daily., Disp: 180 tablet, Rfl: 4 .  diphenoxylate-atropine (LOMOTIL) 2.5-0.025 MG tablet, Take 1 tablet by mouth 4 (four) times daily as needed for diarrhea or loose stools.,  Disp: 120 tablet, Rfl: 3 .  feeding supplement, ENSURE ENLIVE, (ENSURE ENLIVE) LIQD, Take 237 mLs by mouth 3 (three) times daily between meals., Disp: 237 mL, Rfl: 12 .  furosemide (LASIX) 20 MG tablet, Take 1 tablet (20 mg total) by mouth daily as needed for fluid. (Patient taking differently: Take 20 mg by mouth daily. ), Disp: 30 tablet, Rfl: 2 .  HYDROcodone-acetaminophen (NORCO) 10-325 MG tablet, Take 1 tablet by mouth every 3 (three) hours as needed. For cancer related pain Dx: C61, Disp: 120 tablet, Rfl: 0 .  ondansetron (ZOFRAN) 8 MG tablet, TAKE 1 TABLET BY MOUTH EVERY 8 HOURS AS NEEDED FOR NAUSEA, Disp: 30 tablet, Rfl: 5 .  Oxycodone HCl 10 MG TABS, One tablet every six hours scheduled, and one every three hours as needed for breakthrough pain, Disp: 120 tablet, Rfl: 0 .  predniSONE (DELTASONE) 5 MG tablet, Take 1 tablet (5 mg total) by mouth 2 (two) times daily with a meal., Disp: 60 tablet, Rfl: 2 .  sodium polystyrene (KAYEXALATE) powder, Take 15mg  twice a day (Patient not taking: Reported on 04/24/2018), Disp: 454 g, Rfl: 0  Review of Systems  Constitutional: Negative.   HENT: Negative.   Eyes: Negative.   Respiratory: Negative.   Cardiovascular: Negative.   Gastrointestinal: Negative.   Endocrine: Negative.   Genitourinary: Negative.   Musculoskeletal: Negative.   Skin: Negative.   Allergic/Immunologic: Negative.   Neurological: Positive for headaches.  Hematological: Negative.   Psychiatric/Behavioral: Negative.     Social History   Tobacco Use  . Smoking status: Current Every Day Smoker    Packs/day: 1.00    Years: 48.00    Pack years: 48.00    Types: Cigars  . Smokeless tobacco: Never Used  . Tobacco comment: used to smoke 2PPD- lately cut down to 1/2 PPD (cigars)  Substance Use Topics  . 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 He appears well-developed.  Frail appearing.   Cardiovascular: Normal rate and regular rhythm.  Pulmonary/Chest: Effort normal and breath sounds normal.  Neurological: He is alert and oriented to person, place, and time.  Skin: Skin is warm and dry.  Psychiatric: He has a normal mood and affect. His behavior is normal.        Assessment & Plan:     1. Essential hypertension  He has a history of myocardial infarction with stent placement and was previously on ARB/diuretic which was stopped due to AKI. However, it is unclear to me if amlodipine was ever stopped of if patient simply did not take it as I do not see a note instructing the patient to stop this medication. In any event, patient is now with metastatic prostate cancer and chronic kidney disease so will restart amlodipine as below. Follow up with Dr. Caryn Section in 1-2 weeks.   - amLODipine (NORVASC) 5 MG tablet; Take 1 tablet (5 mg total) by mouth daily.  Dispense: 90 tablet; Refill: 0  2. Prostate cancer ([place])  Followed by oncology, metastatic.   The entirety of the information documented in the History of Present Illness, Review of Systems and Physical Exam were personally obtained by me. Portions of this information were initially documented by Edd Arbour, CMA and reviewed by me for thoroughness and accuracy.             Trinna Post, PA-C  [facility]

## 2018-04-25 NOTE — Telephone Encounter (Signed)
Re:  PSA  Spoke to patient's wife.  PSA has decreased from 799 to 107.  He is doing well on abiraterone.   Lequita Asal, MD

## 2018-04-25 NOTE — Telephone Encounter (Signed)
Noted thank you

## 2018-04-25 NOTE — Telephone Encounter (Signed)
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 Patients wife Levander Campion called requesting an appointment. She states patient's blood pressure has been elevated for the past few weeks. Patient was seen in the office by McKenzie on 04/20/2018 and his blood pressure was elevated at 180/76. Patient was told to monitor his  Blood pressure readings. [name] states that his blood pressure readings have been averaging between 170-190/ 80-90. He has also been having headaches. Patient denies any chest pain, shortness of breath, blurred vision or numbness. Patient is not on any blood pressure medications. I scheduled an appointment for patient to be seen today at 12:20pm.

## 2018-05-08 ENCOUNTER — Inpatient Hospital Stay: Payer: Medicare Other | Attending: Hematology and Oncology

## 2018-05-08 DIAGNOSIS — F17210 Nicotine dependence, cigarettes, uncomplicated: Secondary | ICD-10-CM | POA: Insufficient documentation

## 2018-05-08 DIAGNOSIS — I10 Essential (primary) hypertension: Secondary | ICD-10-CM | POA: Insufficient documentation

## 2018-05-08 DIAGNOSIS — C7951 Secondary malignant neoplasm of bone: Secondary | ICD-10-CM | POA: Diagnosis not present

## 2018-05-08 DIAGNOSIS — Z79899 Other long term (current) drug therapy: Secondary | ICD-10-CM | POA: Diagnosis not present

## 2018-05-08 DIAGNOSIS — C61 Malignant neoplasm of prostate: Secondary | ICD-10-CM | POA: Diagnosis not present

## 2018-05-08 DIAGNOSIS — F17290 Nicotine dependence, other tobacco product, uncomplicated: Secondary | ICD-10-CM | POA: Diagnosis not present

## 2018-05-08 DIAGNOSIS — N189 Chronic kidney disease, unspecified: Secondary | ICD-10-CM | POA: Insufficient documentation

## 2018-05-08 DIAGNOSIS — I252 Old myocardial infarction: Secondary | ICD-10-CM | POA: Diagnosis not present

## 2018-05-08 LAB — CBC WITH DIFFERENTIAL/PLATELET
Abs Immature Granulocytes: 0.02 10*3/uL (ref 0.00–0.07)
Basophils Absolute: 0 10*3/uL (ref 0.0–0.1)
Basophils Relative: 1 %
Eosinophils Absolute: 0.2 10*3/uL (ref 0.0–0.5)
Eosinophils Relative: 3 %
HCT: 37.2 % — ABNORMAL LOW (ref 39.0–52.0)
Hemoglobin: 12 g/dL — ABNORMAL LOW (ref 13.0–17.0)
Immature Granulocytes: 0 %
Lymphocytes Relative: 24 %
Lymphs Abs: 1.5 10*3/uL (ref 0.7–4.0)
MCH: 30.9 pg (ref 26.0–34.0)
MCHC: 32.3 g/dL (ref 30.0–36.0)
MCV: 95.9 fL (ref 80.0–100.0)
Monocytes Absolute: 0.4 10*3/uL (ref 0.1–1.0)
Monocytes Relative: 7 %
Neutro Abs: 4 10*3/uL (ref 1.7–7.7)
Neutrophils Relative %: 65 %
Platelets: 233 10*3/uL (ref 150–400)
RBC: 3.88 MIL/uL — ABNORMAL LOW (ref 4.22–5.81)
RDW: 15.5 % (ref 11.5–15.5)
WBC: 6 10*3/uL (ref 4.0–10.5)
nRBC: 0 % (ref 0.0–0.2)

## 2018-05-08 LAB — COMPREHENSIVE METABOLIC PANEL
ALT: 10 U/L (ref 0–44)
AST: 13 U/L — ABNORMAL LOW (ref 15–41)
Albumin: 3.8 g/dL (ref 3.5–5.0)
Alkaline Phosphatase: 303 U/L — ABNORMAL HIGH (ref 38–126)
Anion gap: 6 (ref 5–15)
BUN: 32 mg/dL — ABNORMAL HIGH (ref 8–23)
CO2: 26 mmol/L (ref 22–32)
Calcium: 9.2 mg/dL (ref 8.9–10.3)
Chloride: 107 mmol/L (ref 98–111)
Creatinine, Ser: 2.11 mg/dL — ABNORMAL HIGH (ref 0.61–1.24)
GFR calc Af Amer: 37 mL/min — ABNORMAL LOW (ref >60)
GFR calc non Af Amer: 32 mL/min — ABNORMAL LOW (ref >60)
Glucose, Bld: 96 mg/dL (ref 70–99)
Potassium: 4.8 mmol/L (ref 3.5–5.1)
Sodium: 139 mmol/L (ref 135–145)
Total Bilirubin: 0.6 mg/dL (ref 0.3–1.2)
Total Protein: 6.8 g/dL (ref 6.5–8.1)

## 2018-05-09 ENCOUNTER — Encounter: Payer: Self-pay | Admitting: Family Medicine

## 2018-05-09 ENCOUNTER — Ambulatory Visit (INDEPENDENT_AMBULATORY_CARE_PROVIDER_SITE_OTHER): Payer: Medicare Other | Admitting: Family Medicine

## 2018-05-09 DIAGNOSIS — I10 Essential (primary) hypertension: Secondary | ICD-10-CM

## 2018-05-09 MED ORDER — AMLODIPINE BESYLATE 5 MG PO TABS: 10.0000 mg | ORAL_TABLET | Freq: Every day | ORAL | 0 refills | Status: DC

## 2018-05-09 NOTE — Patient Instructions (Signed)
Increase amlodipine to 2 tablets daily. Call if you run out before your next appointment.

## 2018-05-09 NOTE — Progress Notes (Signed)
Patient: Michael Small Male    DOB: 1956-03-17   62 y.o.   MRN: [id number] Visit Date: 05/09/2018  Today's Provider: [name], MD   Chief Complaint  Patient presents with  . Hypertension   Subjective:    HPI  Hypertension, follow-up:  BP Readings from Last 3 Encounters:  05/09/18 (!) 176/86  04/25/18 (!) 190/88  04/24/18 (!) 192/84    He was last seen for hypertension 2 weeks ago.  BP at that visit was 190/88. Management since that visit includes restarting Amlodipine. He reports good compliance with treatment. He is not having side effects.  He is exercising. He is adherent to low salt diet.   Outside blood pressures are 158/84. He is experiencing none.  Patient denies chest pain, chest pressure/discomfort, claudication, dyspnea, exertional chest pressure/discomfort, fatigue, irregular heart beat, lower extremity edema, near-syncope, orthopnea, palpitations, paroxysmal nocturnal dyspnea, syncope and tachypnea.   Cardiovascular risk factors include advanced age (older than 67 for men, 77 for women), hypertension and male gender.  Use of agents associated with hypertension: none.     Weight trend: fluctuating a bit Wt Readings from Last 3 Encounters:  05/09/18 149 lb (67.6 kg)  04/25/18 145 lb 9.6 oz (66 kg)  04/24/18 146 lb 6.4 oz (66.4 kg)    Current diet: well balanced  ------------------------------------------------------------------------     Allergies  Allergen Reactions  . Zytiga [Abiraterone] Diarrhea     Current Outpatient Medications:  .  abiraterone acetate (ZYTIGA) 250 MG tablet, Take 4 tablets (1,000 mg total) by mouth daily. Take on an empty stomach 1 hour before or 2 hours after a meal, Disp: 120 tablet, Rfl: 0 .  amLODipine (NORVASC) 5 MG tablet, Take 1 tablet (5 mg total) by mouth daily., Disp: 90 tablet, Rfl: 0 .  buPROPion (WELLBUTRIN SR) 150 MG 12 hr tablet, Take 1 tablet (150 mg total) by mouth 2 (two) times daily., Disp:  180 tablet, Rfl: 4 .  diphenoxylate-atropine (LOMOTIL) 2.5-0.025 MG tablet, Take 1 tablet by mouth 4 (four) times daily as needed for diarrhea or loose stools., Disp: 120 tablet, Rfl: 3 .  feeding supplement, ENSURE ENLIVE, (ENSURE ENLIVE) LIQD, Take 237 mLs by mouth 3 (three) times daily between meals., Disp: 237 mL, Rfl: 12 .  furosemide (LASIX) 20 MG tablet, Take 1 tablet (20 mg total) by mouth daily as needed for fluid. (Patient taking differently: Take 20 mg by mouth daily. ), Disp: 30 tablet, Rfl: 2 .  HYDROcodone-acetaminophen (NORCO) 10-325 MG tablet, Take 1 tablet by mouth every 3 (three) hours as needed. For cancer related pain Dx: C61, Disp: 120 tablet, Rfl: 0 .  ondansetron (ZOFRAN) 8 MG tablet, TAKE 1 TABLET BY MOUTH EVERY 8 HOURS AS NEEDED FOR NAUSEA, Disp: 30 tablet, Rfl: 5 .  Oxycodone HCl 10 MG TABS, One tablet every six hours scheduled, and one every three hours as needed for breakthrough pain, Disp: 120 tablet, Rfl: 0 .  predniSONE (DELTASONE) 5 MG tablet, Take 1 tablet (5 mg total) by mouth 2 (two) times daily with a meal., Disp: 60 tablet, Rfl: 2 .  sodium polystyrene (KAYEXALATE) powder, Take 15mg  twice a day, Disp: 454 g, Rfl: 0  Review of Systems  Constitutional: Positive for diaphoresis (night sweats). Negative for appetite change, chills and fever.  Respiratory: Negative for chest tightness, shortness of breath and wheezing.   Cardiovascular: Negative for chest pain and palpitations.  Gastrointestinal: Negative for abdominal pain, nausea and vomiting.  Social History   Tobacco Use  . Smoking status: Current Every Day Smoker    Packs/day: 1.00    Years: 48.00    Pack years: 48.00    Types: Cigars  . Smokeless tobacco: Never Used  . Tobacco comment: used to smoke 2PPD- lately cut down to 1/2 PPD (cigars)  Substance Use Topics  . 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 Essential hypertension Much better since restarting amlodipine. Will increase to- amLODipine (NORVASC) 5 MG tablet; Take 2 tablets (10 mg total) by mouth daily.  Dispense: 3 tablet; Refill: 0  He had 90 day prescription dispensed about two weeks ago and should have enough to take 2 a day until follow up 06-07-2018. Is to call if not.        [name], MD  [facility]

## 2018-05-10 ENCOUNTER — Other Ambulatory Visit: Payer: Self-pay | Admitting: Urgent Care

## 2018-05-10 DIAGNOSIS — C61 Malignant neoplasm of prostate: Secondary | ICD-10-CM

## 2018-05-15 MED FILL — ABIRATERONE ACETATE 250 MG: 250 | 30 days supply | Qty: 120 | Fill #0

## 2018-05-15 MED FILL — predniSONE 5 MG TABS: 5 | 30 days supply | Qty: 60 | Fill #2

## 2018-05-16 ENCOUNTER — Other Ambulatory Visit: Payer: Self-pay | Admitting: Family Medicine

## 2018-05-16 MED ORDER — HYDROCODONE-ACETAMINOPHEN 10-325 MG PO TABS: 1.0000 | ORAL_TABLET | ORAL | 0 refills | Status: DC | PRN

## 2018-05-16 MED ORDER — OXYCODONE HCL 10 MG PO TABS: ORAL_TABLET | ORAL | 0 refills | Status: DC

## 2018-05-16 NOTE — Telephone Encounter (Signed)
Pt's wife calling regarding pt needing refills on: Oxycodone HCl 10 MG TABS HYDROcodone-acetaminophen (NORCO) 10-325 MG tablet   Please fill at:  [id number] [place], [place] [phone] (Phone) [phone] (Fax)   Thanks, [employer]

## 2018-05-22 ENCOUNTER — Encounter: Payer: Self-pay | Admitting: Hematology and Oncology

## 2018-05-22 ENCOUNTER — Inpatient Hospital Stay: Payer: Medicare Other

## 2018-05-22 ENCOUNTER — Inpatient Hospital Stay (HOSPITAL_BASED_OUTPATIENT_CLINIC_OR_DEPARTMENT_OTHER): Payer: Medicare Other | Admitting: Hematology and Oncology

## 2018-05-22 ENCOUNTER — Other Ambulatory Visit: Payer: Self-pay

## 2018-05-22 VITALS — BP 176/83 | HR 81 | Temp 95.6°F | Resp 18 | Wt 149.6 lb

## 2018-05-22 DIAGNOSIS — N289 Disorder of kidney and ureter, unspecified: Secondary | ICD-10-CM

## 2018-05-22 DIAGNOSIS — C7951 Secondary malignant neoplasm of bone: Secondary | ICD-10-CM

## 2018-05-22 DIAGNOSIS — Z79899 Other long term (current) drug therapy: Secondary | ICD-10-CM | POA: Diagnosis not present

## 2018-05-22 DIAGNOSIS — F17290 Nicotine dependence, other tobacco product, uncomplicated: Secondary | ICD-10-CM | POA: Diagnosis not present

## 2018-05-22 DIAGNOSIS — C61 Malignant neoplasm of prostate: Secondary | ICD-10-CM | POA: Diagnosis not present

## 2018-05-22 DIAGNOSIS — I252 Old myocardial infarction: Secondary | ICD-10-CM

## 2018-05-22 DIAGNOSIS — I10 Essential (primary) hypertension: Secondary | ICD-10-CM | POA: Diagnosis not present

## 2018-05-22 DIAGNOSIS — F17210 Nicotine dependence, cigarettes, uncomplicated: Secondary | ICD-10-CM | POA: Diagnosis not present

## 2018-05-22 DIAGNOSIS — N189 Chronic kidney disease, unspecified: Secondary | ICD-10-CM

## 2018-05-22 DIAGNOSIS — E875 Hyperkalemia: Secondary | ICD-10-CM

## 2018-05-22 LAB — CBC WITH DIFFERENTIAL/PLATELET
Abs Immature Granulocytes: 0.01 10*3/uL (ref 0.00–0.07)
Basophils Absolute: 0.1 10*3/uL (ref 0.0–0.1)
Basophils Relative: 1 %
Eosinophils Absolute: 0.2 10*3/uL (ref 0.0–0.5)
Eosinophils Relative: 4 %
HCT: 37.2 % — ABNORMAL LOW (ref 39.0–52.0)
Hemoglobin: 11.9 g/dL — ABNORMAL LOW (ref 13.0–17.0)
Immature Granulocytes: 0 %
Lymphocytes Relative: 30 %
Lymphs Abs: 1.9 10*3/uL (ref 0.7–4.0)
MCH: 30.5 pg (ref 26.0–34.0)
MCHC: 32 g/dL (ref 30.0–36.0)
MCV: 95.4 fL (ref 80.0–100.0)
Monocytes Absolute: 0.5 10*3/uL (ref 0.1–1.0)
Monocytes Relative: 7 %
Neutro Abs: 3.7 10*3/uL (ref 1.7–7.7)
Neutrophils Relative %: 58 %
Platelets: 231 10*3/uL (ref 150–400)
RBC: 3.9 MIL/uL — ABNORMAL LOW (ref 4.22–5.81)
RDW: 14.8 % (ref 11.5–15.5)
WBC: 6.4 10*3/uL (ref 4.0–10.5)
nRBC: 0 % (ref 0.0–0.2)

## 2018-05-22 LAB — COMPREHENSIVE METABOLIC PANEL
ALT: 11 U/L (ref 0–44)
AST: 16 U/L (ref 15–41)
Albumin: 4.1 g/dL (ref 3.5–5.0)
Alkaline Phosphatase: 292 U/L — ABNORMAL HIGH (ref 38–126)
Anion gap: 9 (ref 5–15)
BUN: 37 mg/dL — ABNORMAL HIGH (ref 8–23)
CO2: 25 mmol/L (ref 22–32)
Calcium: 9.5 mg/dL (ref 8.9–10.3)
Chloride: 106 mmol/L (ref 98–111)
Creatinine, Ser: 2.15 mg/dL — ABNORMAL HIGH (ref 0.61–1.24)
GFR calc Af Amer: 37 mL/min — ABNORMAL LOW (ref >60)
GFR calc non Af Amer: 32 mL/min — ABNORMAL LOW (ref >60)
Glucose, Bld: 109 mg/dL — ABNORMAL HIGH (ref 70–99)
Potassium: 4.5 mmol/L (ref 3.5–5.1)
Sodium: 140 mmol/L (ref 135–145)
Total Bilirubin: 0.6 mg/dL (ref 0.3–1.2)
Total Protein: 7 g/dL (ref 6.5–8.1)

## 2018-05-22 LAB — PSA: Prostatic Specific Antigen: 116.03 ng/mL — ABNORMAL HIGH (ref 0.00–4.00)

## 2018-05-22 NOTE — Progress Notes (Signed)
Patient states he is having night sweats.  Wants to know if there is medication to help.  Appetite good. Maintaining his weight.

## 2018-05-22 NOTE — Progress Notes (Signed)
[facility] day:  05/22/2018   Chief Complaint: Michael Small is a 62 y.o. male with metastatic prostate cancer who is seen for 1 month assessment on abiraterone and continuation of Lupron.  HPI:   The patient was last seen in the medical oncology clinic on 04/24/2018.   At that time, he was doing well.  He had titrated his abiraterone up to 4 pills.  He denied any nausea, vomiting or change in bowels.  He was struggling with the SPS liquid prescribed by PCP for his HYPERkalemia.  He denied any pain.  Weight had increased 7 pounds.  PSA was 107.14  During the interim, patient has been "feeling pretty good" with the exception of significant night sweats. Patient denies any other acute concerns today. He denies fevers or recent infections. Patient denies bleeding; no hematochezia, melena, or gross hematuria. No urinary symptoms.  Patient continues on abiraterone as prescribed.   Patient advises that he maintains an adequate appetite. He is eating well. Weight today is 149 lb 9 oz (67.8 kg), which compared to his last visit to the clinic, represents a 3 pound increase.    Patient denies pain in the clinic today.   Past Medical History:  Diagnosis Date  . Chronic kidney disease   . Depression   . GERD (gastroesophageal reflux disease)   . Hyperlipidemia   . Hypertension   . Myocardial infarction [facility])    s/p stent  . Neuromuscular disorder (HCC)    numbness in arms and legs  . Prostate cancer [facility])     Past Surgical History:  Procedure Laterality Date  . CORONARY ANGIOPLASTY WITH STENT PLACEMENT  12/2011   [facility]  . CYSTOSCOPY W/ RETROGRADES Bilateral 05/24/2017   Procedure: CYSTOSCOPY WITH RETROGRADE PYELOGRAM;  Surgeon: [name], MD;  Location: [facility] ORS;  Service: Urology;  Laterality: Bilateral;  . CYSTOSCOPY W/ RETROGRADES Bilateral 11/21/2017   Procedure: CYSTOSCOPY WITH RETROGRADE PYELOGRAM;  Surgeon: [name], MD;  Location: [facility];  Service: Urology;  Laterality: Bilateral;  . CYSTOSCOPY W/ RETROGRADES Bilateral 03/20/2018   Procedure: CYSTOSCOPY WITH RETROGRADE PYELOGRAM;  Surgeon: [name], MD;  Location: [facility];  Service: Urology;  Laterality: Bilateral;  . CYSTOSCOPY W/ URETERAL STENT PLACEMENT Bilateral 11/21/2017   Procedure: CYSTOSCOPY WITH STENT REPLACEMENT;  Surgeon: [name], MD;  Location: [facility];  Service: Urology;  Laterality: Bilateral;  . CYSTOSCOPY W/ URETERAL STENT REMOVAL Bilateral 11/21/2017   Procedure: CYSTOSCOPY WITH STENT REMOVAL;  Surgeon: [name], MD;  Location: [facility];  Service: Urology;  Laterality: Bilateral;  . CYSTOSCOPY WITH STENT PLACEMENT Right 05/24/2017   Procedure: CYSTOSCOPY WITH STENT PLACEMENT;  Surgeon: [name], MD;  Location: [facility];  Service: Urology;  Laterality: Right;  . CYSTOSCOPY WITH STENT PLACEMENT Bilateral 03/20/2018   Procedure: CYSTOSCOPY WITH STENT EXCHANGE;  Surgeon: [name], MD;  Location: [facility];  Service: Urology;  Laterality: Bilateral;  . IR NEPHROSTOGRAM LEFT THRU EXISTING ACCESS  07/21/2017  . IR NEPHROSTOGRAM LEFT THRU EXISTING ACCESS  08/18/2017  . IR NEPHROSTOGRAM RIGHT THRU EXISTING ACCESS  08/18/2017  . IR NEPHROSTOMY PLACEMENT LEFT  06/15/2017  . IR NEPHROSTOMY PLACEMENT RIGHT  06/14/2017  . IR URETERAL STENT PLACEMENT EXISTING ACCESS LEFT  07/21/2017  . PICC LINE INSERTION N/A 08/02/2017   Procedure: PICC LINE INSERTION;  Surgeon: Katha Cabal, MD;  Location: [facility];  Service: Cardiovascular;  Laterality: N/A;  . PROSTATE BIOPSY N/A 05/24/2017   Procedure: PROSTATE BIOPSY;  Surgeon: [name], MD;  Location: [facility];  Service: Urology;  Laterality: N/A;  . TEE WITHOUT CARDIOVERSION N/A 07/31/2017   Procedure: TRANSESOPHAGEAL ECHOCARDIOGRAM (TEE);  Surgeon: Teodoro Spray, MD;  Location: [facility];  Service: Cardiovascular;  Laterality: N/A;  . US  ECHOCARDIOGRAPHY  12/2011   Mild LV dysfunction, EF=45%    Family History  Problem Relation Age of Onset  . Cancer Mother   . Hypertension Mother   . Diabetes Father   . Heart disease Father   . Cancer Paternal Grandfather   . Prostate cancer Neg Hx   . Bladder Cancer Neg Hx   . Kidney cancer Neg Hx     Social History:  reports that he has been smoking cigars. He has a 48.00 pack-year smoking history. He has never used smokeless tobacco. He reports that he does not drink alcohol or use drugs.  He has smoked 1 pack per day since age 35 (1 years).  He smokes small cigars.  He does not drink alcohol, but previously drank heavily.  He describes being exposed to Round Up and asbestos.  He has been disabled for several years secondary to "nerve damage and problems in his legs, arms, and bad back".  He fell off a ladder.  He previously worked for a [employer], [employer].  He lives in [place].  He has step children.  The patient is alone today.  Allergies:  Allergies  Allergen Reactions  . Zytiga [Abiraterone] Diarrhea    Current Medications: Current Outpatient Medications  Medication Sig Dispense Refill  . abiraterone acetate (ZYTIGA) 250 MG tablet TAKE 4 TABLETS (1,000 MG TOTAL) BY MOUTH DAILY. TAKE ON AN EMPTY STOMACH 1 HOUR BEFORE OR 2 HOURS AFTER A MEAL 120 tablet 0  . amLODipine (NORVASC) 5 MG tablet Take 2 tablets (10 mg total) by mouth daily. 3 tablet 0  . buPROPion (WELLBUTRIN SR) 150 MG 12 hr tablet Take 1 tablet (150 mg total) by mouth 2 (two) times daily. 180 tablet 4  . diphenoxylate-atropine (LOMOTIL) 2.5-0.025 MG tablet Take 1 tablet by mouth 4 (four) times daily as needed for diarrhea or loose stools. 120 tablet 3  . feeding supplement, ENSURE ENLIVE, (ENSURE ENLIVE) LIQD Take 237 mLs by mouth 3 (three) times daily between meals. 237 mL 12  . furosemide (LASIX) 20 MG tablet Take 1 tablet (20 mg total) by mouth daily as needed for fluid. (Patient taking differently: Take 20  mg by mouth daily. ) 30 tablet 2  . HYDROcodone-acetaminophen (NORCO) 10-325 MG tablet Take 1 tablet by mouth every 3 (three) hours as needed. For cancer related pain Dx: C61 120 tablet 0  . ondansetron (ZOFRAN) 8 MG tablet TAKE 1 TABLET BY MOUTH EVERY 8 HOURS AS NEEDED FOR NAUSEA 30 tablet 5  . Oxycodone HCl 10 MG TABS One tablet every six hours scheduled, and one every three hours as needed for breakthrough pain 120 tablet 0  . predniSONE (DELTASONE) 5 MG tablet Take 1 tablet (5 mg total) by mouth 2 (two) times daily with a meal. 60 tablet 2  . sodium polystyrene (KAYEXALATE) powder Take 15mg  twice a day 454 g 0   No current facility-administered medications for this visit.     Review of Systems  Constitutional: Negative.  Negative for chills, diaphoresis, fever, malaise/fatigue and weight loss (up 2 pounds).       Feels "pretty good".  HENT: Negative.  Negative for congestion, ear discharge, ear pain, nosebleeds, sinus pain and sore throat.   Eyes: Negative.  Negative for double vision, photophobia, pain, discharge and redness.  Respiratory: Positive for shortness of breath (exertional). Negative for cough, hemoptysis, sputum production and wheezing.   Cardiovascular: Negative for chest pain, palpitations, orthopnea, leg swelling and PND.       H/o myocardial infarction.  Gastrointestinal: Negative for abdominal pain, blood in stool, constipation, diarrhea, melena, nausea and vomiting.  Genitourinary: Negative for dysuria, frequency, hematuria and urgency.       S/p nephrostomy tubes.  Musculoskeletal: Positive for back pain (sacral pain). Negative for falls, joint pain, myalgias and neck pain.  Skin: Negative.  Negative for itching and rash.  Neurological: Negative for dizziness, tingling, tremors, sensory change, speech change, focal weakness, weakness and headaches.  Endo/Heme/Allergies: Bruises/bleeds easily.       Vasomotor symptoms (intermittent night sweats).   Psychiatric/Behavioral: Negative for depression and memory loss. The patient is not nervous/anxious and does not have insomnia.   All other systems reviewed and are negative.  Performance status (ECOG): 2  Vital Signs: BP (!) 176/83 (BP Location: Left Arm, Patient Position: Sitting)   Pulse 81   Temp (!) 95.6 F (35.3 C) (Tympanic)   Resp 18   Wt 149 lb 9 oz (67.8 kg)   BMI 20.86 kg/m   Physical Exam  Constitutional: He is oriented to person, place, and time. No distress.  Slightly fatigued appearing gentleman sitting comfortably in the exam room in no acute distress.  HENT:  Head: Normocephalic and atraumatic.  Mouth/Throat: Oropharynx is clear and moist and mucous membranes are normal.  Graying hair.  Edentulous.  Eyes: Pupils are equal, round, and reactive to light. Conjunctivae and EOM are normal. No scleral icterus.  Neck: Neck supple. No JVD present.  Cardiovascular: Normal rate, regular rhythm, normal heart sounds and intact distal pulses. Exam reveals no gallop and no friction rub.  No murmur heard. Pulmonary/Chest: Effort normal and breath sounds normal. No respiratory distress. He has no wheezes. He has no rales.  Abdominal: Soft. Bowel sounds are normal. He exhibits no distension and no mass. There is no abdominal tenderness. There is no rebound and no guarding.  Musculoskeletal: Normal range of motion.        General: Edema (trace LE edema.) present. No tenderness.  Lymphadenopathy:    He has no cervical adenopathy.    He has no axillary adenopathy.       Right: No supraclavicular adenopathy present.       Left: No supraclavicular adenopathy present.  Neurological: He is alert and oriented to person, place, and time.  Skin: Skin is warm and dry. No rash noted. He is not diaphoretic. No erythema. No pallor.  Psychiatric: Mood, affect and judgment normal.  Smiling.   Nursing note and vitals reviewed.   Orders Only on 05/22/2018  Component Date Value Ref Range  Status  . Sodium 05/22/2018 140  135 - 145 mmol/L Final  . Potassium 05/22/2018 4.5  3.5 - 5.1 mmol/L Final  . Chloride 05/22/2018 106  98 - 111 mmol/L Final  . CO2 05/22/2018 25  22 - 32 mmol/L Final  . Glucose, Bld 05/22/2018 109* 70 - 99 mg/dL Final  . BUN 05/22/2018 37* 8 - 23 mg/dL Final  . Creatinine, Ser 05/22/2018 2.15* 0.61 - 1.24 mg/dL Final  . Calcium 05/22/2018 9.5  8.9 - 10.3 mg/dL Final  . Total Protein 05/22/2018 7.0  6.5 - 8.1 g/dL  Final  . Albumin 05/22/2018 4.1  3.5 - 5.0 g/dL Final  . AST 05/22/2018 16  15 - 41 U/L Final  . ALT 05/22/2018 11  0 - 44 U/L Final  . Alkaline Phosphatase 05/22/2018 292* 38 - 126 U/L Final  . Total Bilirubin 05/22/2018 0.6  0.3 - 1.2 mg/dL Final  . GFR calc non Af Amer 05/22/2018 32* >60 mL/min Final  . GFR calc Af Amer 05/22/2018 37* >60 mL/min Final  . Anion gap 05/22/2018 9  5 - 15 Final   Performed at [facility], [address] [place], [place] [address]  . WBC 05/22/2018 6.4  4.0 - 10.5 K/uL Final  . RBC 05/22/2018 3.90* 4.22 - 5.81 MIL/uL Final  . Hemoglobin 05/22/2018 11.9* 13.0 - 17.0 g/dL Final  . HCT 05/22/2018 37.2* 39.0 - 52.0 % Final  . MCV 05/22/2018 95.4  80.0 - 100.0 fL Final  . MCH 05/22/2018 30.5  26.0 - 34.0 pg Final  . MCHC 05/22/2018 32.0  30.0 - 36.0 g/dL Final  . RDW 05/22/2018 14.8  11.5 - 15.5 % Final  . Platelets 05/22/2018 231  150 - 400 K/uL Final  . nRBC 05/22/2018 0.0  0.0 - 0.2 % Final  . Neutrophils Relative % 05/22/2018 58  % Final  . Neutro Abs 05/22/2018 3.7  1.7 - 7.7 K/uL Final  . Lymphocytes Relative 05/22/2018 30  % Final  . Lymphs Abs 05/22/2018 1.9  0.7 - 4.0 K/uL Final  . Monocytes Relative 05/22/2018 7  % Final  . Monocytes Absolute 05/22/2018 0.5  0.1 - 1.0 K/uL Final  . Eosinophils Relative 05/22/2018 4  % Final  . Eosinophils Absolute 05/22/2018 0.2  0.0 - 0.5 K/uL Final  . Basophils Relative 05/22/2018 1  % Final  . Basophils Absolute 05/22/2018 0.1  0.0 - 0.1 K/uL Final  .  Immature Granulocytes 05/22/2018 0  % Final  . Abs Immature Granulocytes 05/22/2018 0.01  0.00 - 0.07 K/uL Final   Performed at [facility], [address] [place],  [address]    Assessment:  Michael Small is a 62 y.o. male with metastatic prostate cancer s/p transrectal prostate biopsies and cystoscopy on 05/24/2017.  Prostate biopsies revealed acinar adenocarcinoma involving all 12 cores.  Adenocarcinoma involved colonic mucosa and skeletal muscle.  Gleason score was 9 (4+5), grade group 5.  Pathologic stage was T4N1M1c.  PSA was 2338.8 on 04/24/2017.  Abdomen and pelvic CT on 04/27/2017 revealed prominent pelvic adenopathy with pathologic retroperitoneal adenopathy, abnormal retroperitoneal stranding, and abnormal inflammatory type stranding along the pelvic sidewalls and perirectal space.  A representative right external iliac lymph node measured 3.2 cm.  A left external iliac lymph node measured 2.1 cm.  There was conglomerate left periaortic adenopathy 2.1 cm and a 1.4 cm aortocaval node.   There was abnormal adenopathy in the perirectal space with prominent wall thickening in the rectum and moderate wall thickening in the sigmoid colon. The findings along the pelvic sidewalls appeared to be causing distal ureteral obstruction resulting in moderate bilateral hydronephrosis and hydroureter.  Lymphoma or prostate cancer was favored. A right external iliac node measured 3.2 cm.  There was bladder wall thickening suggesting cystitis.  There was prostatomegaly.  Bone scan on 06/22/2017 revealed findings consistent with diffuse metastatic disease.  There was activity in right temporal area, sternum, anterior and posterior ribs bilaterally, and entire spine.  Non-contrast chest CT on 06/27/2017 revealed right infrahilar prominence, progressed from prior CT, worrisome  for a node/nodule, but poorly visualized given the lack of IV contrast.  There was mid thoracic lymphadenopathy, progressed,  including a dominant 14 mm short axis node at the left thoracic inlet.  There was stable 19 x 9 mm irregular nodule at the right lung apex.  There was a sclerotic osseous metastases at T8-9.  Bone scan on 03/09/2018 revealed widespread osseous metastatic disease with super scan appearance, progressive since prior exam.  Abdomen and pelvis CT on 03/14/2018 revealed interval progression of widespread sclerotic bony metastatic involvement with new areas of lucency in the axial skeleton and bony Pelvis.  There was evidence of interval renal atrophy, right greater than left. There was mild persistent right hydronephrosis.  There was interval removal of percutaneous nephrostomy catheters with internal ureteral stents visualized bilaterally in situ.  There was mild lymphadenopathy in the pelvic sidewall bilaterally.  There was diffuse body wall, retroperitoneal, and mesenteric edema.  There was persistent circumferential wall thickening in the distal rectum.  He received Lupron 22.5 mg every 3 months (06/01/2017; 10/02/2017; last 03/27/2018) and Degarelix 240 mg on 06/15/2017.  He received Casodex from 06/13/2017 - 06/15/2017.    He began abiraterone on 02/28/2018 and discontinued after 2 days secondary to nausea, vomiting, diarrhea, and fatigue.  He restarted abiraterone (1 pill/day) on 03/16/2018 and increased to 2 pills/day on 03/23/2018.  He increased too 4 tablets in mid 03/2018.  Testosterone was 98 on 06/14/2017, < 3 on 09/07/2017, and < 3 on 03/16/2018.  PSA has been followed:  2338.8 on 04/24/2017, 3603.0 on 06/23/2017, 1035 on 07/26/2017, 241 on 09/07/2017, 342 on 01/23/2018, 799 on 10/24/2017, 107.14 on 04/24/2018.  He was [facility] from [facility]/12[facility] - 06/15/2017 with hyperkalemia and acute renal failure.  Creatinine was 7.23.  He underwent emergent dialysis x1. He is s/p bilateral external nephrostomy tube placement on 06/14/2017.  He was admitted to [facility] from 07/28/2017 - 07/31/2017 with  sepsis secondary to UTI with MSSA bacteremia.    Bilateral lower extremity duplex on 06/12/2017 revealed no evidence of DVT.   Code status is DNR/DNI.  Symptomatically, he is doing well.  He notes vasomotor symptoms (night sweats).  Exam is stable.  Plan: 1. Labs today:  CBC with diff, CMP, PSA. 2. Metastatic prostate cancer:  Clinically doing well.  Review trend in PSA.  Dramatic decrease on abiraterone. Continue abiraterone and prednisone. Continue every 2 week LFT check for 3 months (mid 06/2018) after patient at full dose abiraterone. Continue Lupron every 3 months (due 06/27/2017). 3. Renal insufficiency: Creatinine 2.15. Potassium normal 4.8. 4. Vasomotor symptoms:  Secondary to  hormonal therapy. 5. RTC every 2 weeks for labs (LFTs) 6. RTC on 06/27/2018 in [place] for MD assessment, labs (CBC with diff, CMP, PSA), and Lupron.   Honor Loh, NP  05/22/2018, 10:40 AM  I saw and evaluated the patient, participating in the key portions of the service and reviewing pertinent diagnostic studies and records.  I reviewed the nurse practitioner's note and agree with the findings and the plan.  The assessment and plan were discussed with the patient.  Several questions were asked by the patient and answered.   Nolon Stalls, MD 05/22/2018,10:40 AM

## 2018-05-24 ENCOUNTER — Telehealth: Payer: Self-pay | Admitting: *Deleted

## 2018-05-24 NOTE — Telephone Encounter (Signed)
Called and spoke to patient's wife to inquire about patient's nephrostomy tubes exchange.   She states they were changed in October and he was told they would not need to be exchanged again for 8 months, which would put it in June 2020.

## 2018-06-05 ENCOUNTER — Inpatient Hospital Stay: Payer: Medicare Other | Attending: Hematology and Oncology

## 2018-06-05 DIAGNOSIS — C7951 Secondary malignant neoplasm of bone: Secondary | ICD-10-CM | POA: Insufficient documentation

## 2018-06-05 DIAGNOSIS — Z5111 Encounter for antineoplastic chemotherapy: Secondary | ICD-10-CM | POA: Diagnosis not present

## 2018-06-05 DIAGNOSIS — C61 Malignant neoplasm of prostate: Secondary | ICD-10-CM | POA: Diagnosis not present

## 2018-06-05 LAB — HEPATIC FUNCTION PANEL
ALT: 12 U/L (ref 0–44)
AST: 15 U/L (ref 15–41)
Albumin: 4.1 g/dL (ref 3.5–5.0)
Alkaline Phosphatase: 325 U/L — ABNORMAL HIGH (ref 38–126)
Bilirubin, Direct: <0.1 mg/dL (ref 0.0–0.2)
Total Bilirubin: 0.7 mg/dL (ref 0.3–1.2)
Total Protein: 6.8 g/dL (ref 6.5–8.1)

## 2018-06-06 ENCOUNTER — Other Ambulatory Visit: Payer: Self-pay | Admitting: Hematology and Oncology

## 2018-06-06 ENCOUNTER — Other Ambulatory Visit: Payer: Self-pay | Admitting: Urgent Care

## 2018-06-06 DIAGNOSIS — C61 Malignant neoplasm of prostate: Secondary | ICD-10-CM

## 2018-06-07 ENCOUNTER — Encounter: Payer: Self-pay | Admitting: Family Medicine

## 2018-06-07 ENCOUNTER — Encounter: Payer: Medicare Other | Admitting: Family Medicine

## 2018-06-13 MED FILL — ABIRATERONE ACETATE 250 MG: 250 | 30 days supply | Qty: 120 | Fill #0

## 2018-06-13 MED FILL — predniSONE 5 MG TABS: 5 | 30 days supply | Qty: 60 | Fill #0

## 2018-06-14 ENCOUNTER — Other Ambulatory Visit: Payer: Self-pay | Admitting: Family Medicine

## 2018-06-14 MED ORDER — OXYCODONE HCL 10 MG PO TABS: ORAL_TABLET | ORAL | 0 refills | Status: DC

## 2018-06-14 MED ORDER — HYDROCODONE-ACETAMINOPHEN 10-325 MG PO TABS: 1.0000 | ORAL_TABLET | ORAL | 0 refills | Status: DC | PRN

## 2018-06-14 NOTE — Telephone Encounter (Signed)
Pt needing refills on:  HYDROcodone-acetaminophen (NORCO) 10-325 MG tablet Oxycodone HCl 10 MG TABS  Please fill at:   [id number] [address], [place] [phone] (Phone) [phone] (Fax)   Thanks, [employer]

## 2018-06-15 ENCOUNTER — Telehealth: Payer: Self-pay

## 2018-06-15 NOTE — Telephone Encounter (Signed)
Call pt regarding lung screening. Pt is a former smoker. Scan can be any afternoon on any day. Pt has no new health issues.

## 2018-06-16 NOTE — Telephone Encounter (Signed)
encounter opened in error, closed for administrative reasons.

## 2018-06-17 ENCOUNTER — Telehealth: Payer: Self-pay | Admitting: *Deleted

## 2018-06-17 DIAGNOSIS — Z87891 Personal history of nicotine dependence: Secondary | ICD-10-CM

## 2018-06-17 DIAGNOSIS — Z122 Encounter for screening for malignant neoplasm of respiratory organs: Secondary | ICD-10-CM

## 2018-06-17 NOTE — Telephone Encounter (Signed)
Patient has been notified that annual lung cancer screening low dose CT scan is due currently or will be in near future. Confirmed that patient is within the age range of 55-77, and asymptomatic, (no signs or symptoms of lung cancer). Patient denies illness that would prevent curative treatment for lung cancer if found. Verified smoking history, (former, quit 2019, 96 pack year). The shared decision making visit was done 11/29/16. Patient is agreeable for CT scan being scheduled.

## 2018-06-19 ENCOUNTER — Encounter: Payer: Self-pay | Admitting: Hematology and Oncology

## 2018-06-19 ENCOUNTER — Inpatient Hospital Stay: Payer: Medicare Other

## 2018-06-19 DIAGNOSIS — C7951 Secondary malignant neoplasm of bone: Secondary | ICD-10-CM | POA: Diagnosis not present

## 2018-06-19 DIAGNOSIS — C61 Malignant neoplasm of prostate: Secondary | ICD-10-CM

## 2018-06-19 DIAGNOSIS — Z5111 Encounter for antineoplastic chemotherapy: Secondary | ICD-10-CM | POA: Diagnosis not present

## 2018-06-19 LAB — HEPATIC FUNCTION PANEL
ALT: 11 U/L (ref 0–44)
AST: 14 U/L — ABNORMAL LOW (ref 15–41)
Albumin: 4.2 g/dL (ref 3.5–5.0)
Alkaline Phosphatase: 365 U/L — ABNORMAL HIGH (ref 38–126)
Bilirubin, Direct: <0.1 mg/dL (ref 0.0–0.2)
Total Bilirubin: 0.6 mg/dL (ref 0.3–1.2)
Total Protein: 7.3 g/dL (ref 6.5–8.1)

## 2018-06-20 ENCOUNTER — Other Ambulatory Visit: Payer: Self-pay | Admitting: Physician Assistant

## 2018-06-20 DIAGNOSIS — I10 Essential (primary) hypertension: Secondary | ICD-10-CM

## 2018-06-27 ENCOUNTER — Inpatient Hospital Stay: Payer: Medicare Other

## 2018-06-27 ENCOUNTER — Ambulatory Visit: Payer: Medicare Other | Admitting: Hematology and Oncology

## 2018-06-27 ENCOUNTER — Ambulatory Visit: Payer: Medicare Other

## 2018-06-27 ENCOUNTER — Encounter: Payer: Self-pay | Admitting: Hematology and Oncology

## 2018-06-27 ENCOUNTER — Other Ambulatory Visit: Payer: Medicare Other

## 2018-06-27 ENCOUNTER — Inpatient Hospital Stay (HOSPITAL_BASED_OUTPATIENT_CLINIC_OR_DEPARTMENT_OTHER): Payer: Medicare Other | Admitting: Hematology and Oncology

## 2018-06-27 VITALS — BP 163/95 | HR 81 | Temp 97.2°F | Resp 18 | Ht 71.0 in | Wt 153.7 lb

## 2018-06-27 DIAGNOSIS — C61 Malignant neoplasm of prostate: Secondary | ICD-10-CM

## 2018-06-27 DIAGNOSIS — C7951 Secondary malignant neoplasm of bone: Secondary | ICD-10-CM | POA: Diagnosis not present

## 2018-06-27 DIAGNOSIS — Z7189 Other specified counseling: Secondary | ICD-10-CM

## 2018-06-27 DIAGNOSIS — N189 Chronic kidney disease, unspecified: Secondary | ICD-10-CM

## 2018-06-27 DIAGNOSIS — Z5111 Encounter for antineoplastic chemotherapy: Secondary | ICD-10-CM | POA: Diagnosis not present

## 2018-06-27 LAB — CBC WITH DIFFERENTIAL/PLATELET
Abs Immature Granulocytes: 0.01 10*3/uL (ref 0.00–0.07)
Basophils Absolute: 0.1 10*3/uL (ref 0.0–0.1)
Basophils Relative: 1 %
Eosinophils Absolute: 0.2 10*3/uL (ref 0.0–0.5)
Eosinophils Relative: 4 %
HCT: 38.2 % — ABNORMAL LOW (ref 39.0–52.0)
Hemoglobin: 12.6 g/dL — ABNORMAL LOW (ref 13.0–17.0)
Immature Granulocytes: 0 %
Lymphocytes Relative: 27 %
Lymphs Abs: 1.8 10*3/uL (ref 0.7–4.0)
MCH: 32 pg (ref 26.0–34.0)
MCHC: 33 g/dL (ref 30.0–36.0)
MCV: 97 fL (ref 80.0–100.0)
Monocytes Absolute: 0.4 10*3/uL (ref 0.1–1.0)
Monocytes Relative: 7 %
Neutro Abs: 4 10*3/uL (ref 1.7–7.7)
Neutrophils Relative %: 61 %
Platelets: 228 10*3/uL (ref 150–400)
RBC: 3.94 MIL/uL — ABNORMAL LOW (ref 4.22–5.81)
RDW: 13.9 % (ref 11.5–15.5)
WBC: 6.5 10*3/uL (ref 4.0–10.5)
nRBC: 0 % (ref 0.0–0.2)

## 2018-06-27 LAB — COMPREHENSIVE METABOLIC PANEL
ALT: 11 U/L (ref 0–44)
AST: 15 U/L (ref 15–41)
Albumin: 3.9 g/dL (ref 3.5–5.0)
Alkaline Phosphatase: 313 U/L — ABNORMAL HIGH (ref 38–126)
Anion gap: 7 (ref 5–15)
BUN: 35 mg/dL — ABNORMAL HIGH (ref 8–23)
CO2: 23 mmol/L (ref 22–32)
Calcium: 8.8 mg/dL — ABNORMAL LOW (ref 8.9–10.3)
Chloride: 108 mmol/L (ref 98–111)
Creatinine, Ser: 2.42 mg/dL — ABNORMAL HIGH (ref 0.61–1.24)
GFR calc Af Amer: 32 mL/min — ABNORMAL LOW (ref >60)
GFR calc non Af Amer: 27 mL/min — ABNORMAL LOW (ref >60)
Glucose, Bld: 109 mg/dL — ABNORMAL HIGH (ref 70–99)
Potassium: 4.8 mmol/L (ref 3.5–5.1)
Sodium: 138 mmol/L (ref 135–145)
Total Bilirubin: 0.3 mg/dL (ref 0.3–1.2)
Total Protein: 7.1 g/dL (ref 6.5–8.1)

## 2018-06-27 LAB — PSA: Prostatic Specific Antigen: 116.52 ng/mL — ABNORMAL HIGH (ref 0.00–4.00)

## 2018-06-27 MED ORDER — LEUPROLIDE ACETATE (3 MONTH) 22.5 MG IM KIT
22.5000 mg | PACK | Freq: Once | INTRAMUSCULAR | Status: AC
  Administered 2018-06-27: 22.5 mg via INTRAMUSCULAR
  Filled 2018-06-27: qty 22.5

## 2018-06-27 NOTE — Progress Notes (Signed)
No new changes noted today 

## 2018-06-27 NOTE — Progress Notes (Signed)
[facility] day:  06/27/2018   Chief Complaint: Michael Small is a 63 y.o. male with metastatic prostate cancer who is seen for 1 month assessment on abiraterone and continuation of Lupron every 3 months.  HPI:   The patient was last seen in the medical oncology clinic on 05/22/2018.   At that time, he was doing well.  He noted vasomotor symptoms (night sweats).  Exam was stable.  PSA was 116.03.  He is scheduled for low dose chest CT on 06/28/2018.  During the interim, patient has continued to do well. He denies any acute issues. Pain is well controlled on the currently prescribed interventions. Patient continues to have significant vasomotor symptoms associated with [facility] agonist (Lupron) medication. He denies any nausea or changes to his bowel habits. He has not experienced any fevers.   Patient scheduled for nephrostomy tube exchanges every 8 months with Dr. Bernardo Heater. Next exchange is scheduled for 10/2018.  Patient continues to smoke. He has "cut back some" to less than 0.5 packs of cigarettes daily.   Patient advises that he maintains an adequate appetite. He is eating well. Weight today is 153 lb 10.6 oz (69.7 kg), which compared to his last visit to the clinic, represents a 4 pound increase.  Patient denies pain in the clinic today.   Past Medical History:  Diagnosis Date  . Chronic kidney disease   . Depression   . GERD (gastroesophageal reflux disease)   . Hyperlipidemia   . Hypertension   . Myocardial infarction [facility])    s/p stent  . Neuromuscular disorder (HCC)    numbness in arms and legs  . Prostate cancer [facility])     Past Surgical History:  Procedure Laterality Date  . CORONARY ANGIOPLASTY WITH STENT PLACEMENT  12/2011   [facility]  . CYSTOSCOPY W/ RETROGRADES Bilateral 05/24/2017   Procedure: CYSTOSCOPY WITH RETROGRADE PYELOGRAM;  Surgeon: [name], MD;  Location: [facility] ORS;  Service: Urology;   Laterality: Bilateral;  . CYSTOSCOPY W/ RETROGRADES Bilateral 11/21/2017   Procedure: CYSTOSCOPY WITH RETROGRADE PYELOGRAM;  Surgeon: [name], MD;  Location: [facility] ORS;  Service: Urology;  Laterality: Bilateral;  . CYSTOSCOPY W/ RETROGRADES Bilateral 03/20/2018   Procedure: CYSTOSCOPY WITH RETROGRADE PYELOGRAM;  Surgeon: [name], MD;  Location: [facility] ORS;  Service: Urology;  Laterality: Bilateral;  . CYSTOSCOPY W/ URETERAL STENT PLACEMENT Bilateral 11/21/2017   Procedure: CYSTOSCOPY WITH STENT REPLACEMENT;  Surgeon: [name], MD;  Location: [facility] ORS;  Service: Urology;  Laterality: Bilateral;  . CYSTOSCOPY W/ URETERAL STENT REMOVAL Bilateral 11/21/2017   Procedure: CYSTOSCOPY WITH STENT REMOVAL;  Surgeon: [name], MD;  Location: [facility] ORS;  Service: Urology;  Laterality: Bilateral;  . CYSTOSCOPY WITH STENT PLACEMENT Right 05/24/2017   Procedure: CYSTOSCOPY WITH STENT PLACEMENT;  Surgeon: [name], MD;  Location: [facility] OR[facility];  Service: Urology;  Laterality: Right;  . CYSTOSCOPY WITH STENT PLACEMENT Bilateral 03/20/2018   Procedure: CYSTOSCOPY WITH STENT EXCHANGE;  Surgeon: [name], MD;  Location: [facility] ORS;  Service: Urology;  Laterality: Bilateral;  . IR NEPHROSTOGRAM LEFT THRU EXISTING ACCESS  07/21/2017  . IR NEPHROSTOGRAM LEFT THRU EXISTING ACCESS  08/18/2017  . IR NEPHROSTOGRAM RIGHT THRU EXISTING ACCESS  08/18/2017  . IR NEPHROSTOMY PLACEMENT LEFT  06/15/2017  . IR NEPHROSTOMY PLACEMENT RIGHT  06/14/2017  . IR URETERAL STENT PLACEMENT EXISTING ACCESS LEFT  07/21/2017  . PICC LINE INSERTION N/A 08/02/2017  Procedure: PICC LINE INSERTION;  Surgeon: Katha Cabal, MD;  Location: [place] [facility];  Service: Cardiovascular;  Laterality: N/A;  . PROSTATE BIOPSY N/A 05/24/2017   Procedure: PROSTATE BIOPSY;  Surgeon: [name], MD;  Location: [facility] ORS;  Service: Urology;  Laterality: N/A;  . TEE WITHOUT CARDIOVERSION N/A 07/31/2017   Procedure:  TRANSESOPHAGEAL ECHOCARDIOGRAM (TEE);  Surgeon: Teodoro Spray, MD;  Location: [facility] ORS;  Service: Cardiovascular;  Laterality: N/A;  . US ECHOCARDIOGRAPHY  12/2011   Mild LV dysfunction, EF=45%    Family History  Problem Relation Age of Onset  . Cancer Mother   . Hypertension Mother   . Diabetes Father   . Heart disease Father   . Cancer Paternal Grandfather   . Prostate cancer Neg Hx   . Bladder Cancer Neg Hx   . Kidney cancer Neg Hx     Social History:  reports that he has been smoking cigars. He has a 48.00 pack-year smoking history. He has never used smokeless tobacco. He reports that he does not drink alcohol or use drugs.  He has smoked 1 pack per day since age 28 (78 years).  He smokes small cigars.  He does not drink alcohol, but previously drank heavily.  He describes being exposed to Round Up and asbestos.  He has been disabled for several years secondary to "nerve damage and problems in his legs, arms, and bad back".  He fell off a ladder.  He previously worked for a [employer], [employer].  He lives in [place].  He has step children.  The patient is alone today.  Allergies:  Allergies  Allergen Reactions  . Zytiga [Abiraterone] Diarrhea    Current Medications: Current Outpatient Medications  Medication Sig Dispense Refill  . abiraterone acetate (ZYTIGA) 250 MG tablet TAKE 4 TABLETS (1,000 MG TOTAL) BY MOUTH DAILY. TAKE ON AN EMPTY STOMACH 1 HOUR BEFORE OR 2 HOURS AFTER A MEAL 120 tablet 0  . amLODipine (NORVASC) 5 MG tablet TAKE 1 TABLET BY MOUTH EVERY DAY 90 tablet 4  . buPROPion (WELLBUTRIN SR) 150 MG 12 hr tablet Take 1 tablet (150 mg total) by mouth 2 (two) times daily. 180 tablet 4  . furosemide (LASIX) 20 MG tablet Take 1 tablet (20 mg total) by mouth daily as needed for fluid. (Patient taking differently: Take 20 mg by mouth daily. ) 30 tablet 2  . HYDROcodone-acetaminophen (NORCO) 10-325 MG tablet Take 1 tablet by mouth every 3 (three) hours as needed. For cancer  related pain Dx: C61 120 tablet 0  . Oxycodone HCl 10 MG TABS One tablet every six hours scheduled, and one every three hours as needed for breakthrough pain 120 tablet 0  . predniSONE (DELTASONE) 5 MG tablet TAKE 1 TABLET BY MOUTH TWICE DAILY WITH MEALS 60 tablet 2  . diphenoxylate-atropine (LOMOTIL) 2.5-0.025 MG tablet Take 1 tablet by mouth 4 (four) times daily as needed for diarrhea or loose stools. (Patient not taking: Reported on 06/27/2018) 120 tablet 3  . feeding supplement, ENSURE ENLIVE, (ENSURE ENLIVE) LIQD Take 237 mLs by mouth 3 (three) times daily between meals. (Patient not taking: Reported on 06/27/2018) 237 mL 12  . ondansetron (ZOFRAN) 8 MG tablet TAKE 1 TABLET BY MOUTH EVERY 8 HOURS AS NEEDED FOR NAUSEA (Patient not taking: Reported on 06/27/2018) 30 tablet 5  . sodium polystyrene (KAYEXALATE) powder Take 15mg  twice a day (Patient not taking: Reported on 06/27/2018) 454 g 0   No current facility-administered medications for this  visit.     Review of Systems  Constitutional: Negative.  Negative for chills, diaphoresis, fever, malaise/fatigue and weight loss (up 4 pounds).       Feels "good".  HENT: Negative.  Negative for congestion, ear discharge, ear pain, nosebleeds, sinus pain and sore throat.   Eyes: Negative.  Negative for blurred vision, double vision, photophobia, pain, discharge and redness.  Respiratory: Positive for shortness of breath (exertional). Negative for cough, hemoptysis, sputum production and wheezing.   Cardiovascular: Negative for chest pain, palpitations, orthopnea, leg swelling and PND.       H/o myocardial infarction.  Gastrointestinal: Negative.  Negative for abdominal pain, blood in stool, constipation, diarrhea, melena, nausea and vomiting.  Genitourinary: Negative for dysuria, frequency, hematuria and urgency.       S/p nephrostomy tubes.  Musculoskeletal: Positive for back pain (sacral pain- chronic). Negative for falls, joint pain, myalgias and neck  pain.  Skin: Negative.  Negative for itching and rash.  Neurological: Negative.  Negative for dizziness, tingling, tremors, sensory change, speech change, focal weakness, weakness and headaches.  Endo/Heme/Allergies:       Vasomotor symptoms.  Psychiatric/Behavioral: Negative.  Negative for depression and memory loss. The patient is not nervous/anxious and does not have insomnia.   All other systems reviewed and are negative.  Performance status (ECOG): 2  Vital Signs: BP (!) 163/95 (BP Location: Left Arm, Patient Position: Sitting)   Pulse 81   Temp (!) 97.2 F (36.2 C) (Tympanic)   Resp 18   Ht 5\' 11"  (1.803 m)   Wt 153 lb 10.6 oz (69.7 kg)   SpO2 100%   BMI 21.43 kg/m   Physical Exam  Constitutional: He is oriented to person, place, and time.  Slightly fatigued appearing gentleman sitting comfortably in the exam room in no acute distress.  HENT:  Head: Normocephalic and atraumatic.  Mouth/Throat: Oropharynx is clear and moist and mucous membranes are normal. No oropharyngeal exudate.  Near alopecia.  Scruffy beard.  Edentulous.  Eyes: Pupils are equal, round, and reactive to light. Conjunctivae and EOM are normal. No scleral icterus.  Blue eyes.  Neck: Normal range of motion. Neck supple. No JVD present.  Cardiovascular: Normal rate, regular rhythm, normal heart sounds and intact distal pulses. Exam reveals no gallop and no friction rub.  No murmur heard. Pulmonary/Chest: Effort normal and breath sounds normal. No respiratory distress. He has no wheezes. He has no rales.  Abdominal: Soft. Bowel sounds are normal. He exhibits no distension and no mass. There is no abdominal tenderness. There is no rebound and no guarding.  Musculoskeletal: Normal range of motion.        General: Edema (trace LE edema) present. No tenderness.  Lymphadenopathy:    He has no cervical adenopathy.    He has no axillary adenopathy.       Right: No supraclavicular adenopathy present.       Left:  No supraclavicular adenopathy present.  Neurological: He is alert and oriented to person, place, and time. Gait normal.  Skin: Skin is warm and dry. No rash noted. He is not diaphoretic. No erythema. No pallor.  Psychiatric: Mood, affect and judgment normal.  Interactive.  Nursing note and vitals reviewed.   Appointment on 06/27/2018  Component Date Value Ref Range Status  . Sodium 06/27/2018 138  135 - 145 mmol/L Final  . Potassium 06/27/2018 4.8  3.5 - 5.1 mmol/L Final  . Chloride 06/27/2018 108  98 - 111 mmol/L Final  . CO2  06/27/2018 23  22 - 32 mmol/L Final  . Glucose, Bld 06/27/2018 109* 70 - 99 mg/dL Final  . BUN 06/27/2018 35* 8 - 23 mg/dL Final  . Creatinine, Ser 06/27/2018 2.42* 0.61 - 1.24 mg/dL Final  . Calcium 06/27/2018 8.8* 8.9 - 10.3 mg/dL Final  . Total Protein 06/27/2018 7.1  6.5 - 8.1 g/dL Final  . Albumin 06/27/2018 3.9  3.5 - 5.0 g/dL Final  . AST 06/27/2018 15  15 - 41 U/L Final  . ALT 06/27/2018 11  0 - 44 U/L Final  . Alkaline Phosphatase 06/27/2018 313* 38 - 126 U/L Final  . Total Bilirubin 06/27/2018 0.3  0.3 - 1.2 mg/dL Final  . GFR calc non Af Amer 06/27/2018 27* >60 mL/min Final  . GFR calc Af Amer 06/27/2018 32* >60 mL/min Final  . Anion gap 06/27/2018 7  5 - 15 Final   Performed at [facility], [address] [place], [place] [address]  . WBC 06/27/2018 6.5  4.0 - 10.5 K/uL Final  . RBC 06/27/2018 3.94* 4.22 - 5.81 MIL/uL Final  . Hemoglobin 06/27/2018 12.6* 13.0 - 17.0 g/dL Final  . HCT 06/27/2018 38.2* 39.0 - 52.0 % Final  . MCV 06/27/2018 97.0  80.0 - 100.0 fL Final  . MCH 06/27/2018 32.0  26.0 - 34.0 pg Final  . MCHC 06/27/2018 33.0  30.0 - 36.0 g/dL Final  . RDW 06/27/2018 13.9  11.5 - 15.5 % Final  . Platelets 06/27/2018 228  150 - 400 K/uL Final  . nRBC 06/27/2018 0.0  0.0 - 0.2 % Final  . Neutrophils Relative % 06/27/2018 61  % Final  . Neutro Abs 06/27/2018 4.0  1.7 - 7.7 K/uL Final  . Lymphocytes Relative 06/27/2018 27  %  Final  . Lymphs Abs 06/27/2018 1.8  0.7 - 4.0 K/uL Final  . Monocytes Relative 06/27/2018 7  % Final  . Monocytes Absolute 06/27/2018 0.4  0.1 - 1.0 K/uL Final  . Eosinophils Relative 06/27/2018 4  % Final  . Eosinophils Absolute 06/27/2018 0.2  0.0 - 0.5 K/uL Final  . Basophils Relative 06/27/2018 1  % Final  . Basophils Absolute 06/27/2018 0.1  0.0 - 0.1 K/uL Final  . Immature Granulocytes 06/27/2018 0  % Final  . Abs Immature Granulocytes 06/27/2018 0.01  0.00 - 0.07 K/uL Final   Performed at [facility], [address], [place], [place] [address]    Assessment:  TERIUS JACUINDE is a 63 y.o. male with metastatic prostate cancer s/p transrectal prostate biopsies and cystoscopy on 05/24/2017.  Prostate biopsies revealed acinar adenocarcinoma involving all 12 cores.  Adenocarcinoma involved colonic mucosa and skeletal muscle.  Gleason score was 9 (4+5), grade group 5.  Pathologic stage was T4N1M1c.  PSA was 2338.8 on 04/24/2017.  Abdomen and pelvic CT on 04/27/2017 revealed prominent pelvic adenopathy with pathologic retroperitoneal adenopathy, abnormal retroperitoneal stranding, and abnormal inflammatory type stranding along the pelvic sidewalls and perirectal space.  A representative right external iliac lymph node measured 3.2 cm.  A left external iliac lymph node measured 2.1 cm.  There was conglomerate left periaortic adenopathy 2.1 cm and a 1.4 cm aortocaval node.   There was abnormal adenopathy in the perirectal space with prominent wall thickening in the rectum and moderate wall thickening in the sigmoid colon. The findings along the pelvic sidewalls appeared to be causing distal ureteral obstruction resulting in moderate bilateral hydronephrosis and hydroureter.  Lymphoma or prostate cancer was favored. A  right external iliac node measured 3.2 cm.  There was bladder wall thickening suggesting cystitis.  There was prostatomegaly.  Bone scan on 06/22/2017 revealed findings  consistent with diffuse metastatic disease.  There was activity in right temporal area, sternum, anterior and posterior ribs bilaterally, and entire spine.  Non-contrast chest CT on 06/27/2017 revealed right infrahilar prominence, progressed from prior CT, worrisome for a node/nodule, but poorly visualized given the lack of IV contrast.  There was mid thoracic lymphadenopathy, progressed, including a dominant 14 mm short axis node at the left thoracic inlet.  There was stable 19 x 9 mm irregular nodule at the right lung apex.  There was a sclerotic osseous metastases at T8-9.  Bone scan on 03/09/2018 revealed widespread osseous metastatic disease with super scan appearance, progressive since prior exam.  Abdomen and pelvis CT on 03/14/2018 revealed interval progression of widespread sclerotic bony metastatic involvement with new areas of lucency in the axial skeleton and bony pelvis.  There was evidence of interval renal atrophy, right greater than left. There was mild persistent right hydronephrosis.  There was interval removal of percutaneous nephrostomy catheters with internal ureteral stents visualized bilaterally in situ.  There was mild lymphadenopathy in the pelvic sidewall bilaterally.  There was diffuse body wall, retroperitoneal, and mesenteric edema.  There was persistent circumferential wall thickening in the distal rectum.  He received Lupron 22.5 mg every 3 months (06/01/2017; 10/02/2017; last 03/27/2018) and Degarelix 240 mg on 06/15/2017.  He received Casodex from 06/13/2017 - 06/15/2017.    He began abiraterone on 02/28/2018 and discontinued after 2 days secondary to nausea, vomiting, diarrhea, and fatigue.  He restarted abiraterone (1 pill/day) on 03/16/2018 and increased to 2 pills/day on 03/23/2018.  He increased too 4 tablets in mid 03/2018.  Testosterone was 98 on 06/14/2017, < 3 on 09/07/2017, and < 3 on 03/16/2018.  PSA has been followed:  2338.8 on 04/24/2017, 3603.0 on  06/23/2017, 1035 on 07/26/2017, 241 on 09/07/2017, 342 on 01/23/2018, 799 on 2020/04/1018, 107.14 on 04/24/2018, 116.03 on 05/22/2018, and 116.52 on 06/27/2018.  He was admitted to [facility] from 06/12/2017 - 06/15/2017 with hyperkalemia and acute renal failure.  Creatinine was 7.23.  He underwent emergent dialysis x1. He is s/p bilateral external nephrostomy tube placement on 06/14/2017.  He was admitted to [facility] from 07/28/2017 - 07/31/2017 with sepsis secondary to UTI with MSSA bacteremia.    Bilateral lower extremity duplex on 06/12/2017 revealed no evidence of DVT.   Code status is DNR/DNI.  Symptomatically, he denies any concerns.  He has vasomotor symptoms.  Pain is well controlled.  Exam is stable.  PSA is 116.52.  Plan: 1. Labs today:  CBC with diff, CMP, PSA. 2.   Metastatic prostate cancer  He is clinically doing well.  PSA is 116.52 (stable).  Continue abiraterone and prednisone.  Continue Lupron every 3 months (due today). Schedule restaging studies  Bone scan 09/08/2018.  Abdomen and pelvis CT on 09/08/2018. 3.   Renal insufficiency Creatinine 2.42.  Potassium 4.8. Patient is scheduled for bilateral nephrostomy tube exchange. 4.   Vasomotor symptoms  Secondary to hormonal therapy.  Discuss consideration of Effexor.  Information provided. 5.   RTC every 2 weeks for labs (LFTs).   6.   RTC in 1 month for MD assessment, labs (CBC with diff, CMP, PSA).   Honor Loh, NP  06/27/2018, 10:08 AM  I saw and evaluated the patient, participating in the key portions of the service and reviewing pertinent diagnostic studies and  records.  I reviewed the nurse practitioner's note and agree with the findings and the plan.  The assessment and plan were discussed with the patient.  Several questions were asked by the patient and answered.   Nolon Stalls, MD 06/27/2018,10:08 AM

## 2018-06-28 ENCOUNTER — Ambulatory Visit
Admission: RE | Admit: 2018-06-28 | Discharge: 2018-06-28 | Disposition: A | Discharge status: Home / Self Care | Payer: Medicare Other | Source: Ambulatory Visit | Attending: Nurse Practitioner | Admitting: Nurse Practitioner

## 2018-06-28 DIAGNOSIS — F17210 Nicotine dependence, cigarettes, uncomplicated: Secondary | ICD-10-CM | POA: Diagnosis not present

## 2018-06-28 DIAGNOSIS — Z87891 Personal history of nicotine dependence: Secondary | ICD-10-CM | POA: Insufficient documentation

## 2018-06-28 DIAGNOSIS — Z122 Encounter for screening for malignant neoplasm of respiratory organs: Secondary | ICD-10-CM | POA: Diagnosis not present

## 2018-06-29 ENCOUNTER — Encounter: Payer: Self-pay | Admitting: *Deleted

## 2018-07-05 ENCOUNTER — Other Ambulatory Visit: Payer: Self-pay | Admitting: Urgent Care

## 2018-07-05 DIAGNOSIS — C61 Malignant neoplasm of prostate: Secondary | ICD-10-CM

## 2018-07-10 ENCOUNTER — Ambulatory Visit (INDEPENDENT_AMBULATORY_CARE_PROVIDER_SITE_OTHER): Payer: Medicare Other | Admitting: Family Medicine

## 2018-07-10 ENCOUNTER — Encounter: Payer: Self-pay | Admitting: Family Medicine

## 2018-07-10 VITALS — BP 190/74 | HR 86 | Temp 98.5°F | Resp 16 | Wt 158.0 lb

## 2018-07-10 DIAGNOSIS — G893 Neoplasm related pain (acute) (chronic): Secondary | ICD-10-CM | POA: Insufficient documentation

## 2018-07-10 DIAGNOSIS — N390 Urinary tract infection, site not specified: Secondary | ICD-10-CM

## 2018-07-10 DIAGNOSIS — R319 Hematuria, unspecified: Secondary | ICD-10-CM

## 2018-07-10 DIAGNOSIS — I10 Essential (primary) hypertension: Secondary | ICD-10-CM | POA: Diagnosis not present

## 2018-07-10 DIAGNOSIS — C61 Malignant neoplasm of prostate: Secondary | ICD-10-CM

## 2018-07-10 LAB — POCT URINALYSIS DIPSTICK
Bilirubin, UA: NEGATIVE
Glucose, UA: NEGATIVE
Ketones, UA: NEGATIVE
NITRITE UA: NEGATIVE
Protein, UA: POSITIVE — AB
SPEC GRAV UA: 1.02 (ref 1.010–1.025)
UROBILINOGEN UA: 0.2 U/dL
pH, UA: 6 (ref 5.0–8.0)

## 2018-07-10 MED ORDER — OXYCODONE HCL 10 MG PO TABS: ORAL_TABLET | ORAL | 0 refills | Status: DC

## 2018-07-10 MED ORDER — CEPHALEXIN 500 MG PO CAPS: 500.0000 mg | ORAL_CAPSULE | Freq: Four times a day (QID) | ORAL | 0 refills | Status: AC

## 2018-07-10 MED ORDER — HYDROCODONE-ACETAMINOPHEN 10-325 MG PO TABS: 1.0000 | ORAL_TABLET | ORAL | 0 refills | Status: DC | PRN

## 2018-07-10 MED ORDER — AMLODIPINE BESYLATE 10 MG PO TABS: 10.0000 mg | ORAL_TABLET | Freq: Every day | ORAL | 3 refills | Status: AC

## 2018-07-10 NOTE — Progress Notes (Signed)
Patient: Michael Small Male    DOB: 10/17/1955   63 y.o.   MRN: [id number] Visit Date: 07/10/2018  Today's Provider: [name], MD   Chief Complaint  Patient presents with  . Urinary Tract Infection   Subjective:     HPI Urine Problem: Patient comes in today with concerns that he hmay have a bladder infection. He states the only symptoms he has is a darker colored urine. Patient says that in the past, he was found to have a UTI when his urine turned dark. Patient denies any other symptoms such as dysuria, nausea, hematuria or frequency.   Follow up hypertension. Had been doing well with 2 x 5mg  amlodipine, but he ran out of medication a few days ago and request prescription refill.   Allergies  Allergen Reactions  . Zytiga [Abiraterone] Diarrhea     Current Outpatient Medications:  .  abiraterone acetate (ZYTIGA) 250 MG tablet, TAKE 4 TABLETS (1,000 MG TOTAL) BY MOUTH DAILY. TAKE ON AN EMPTY STOMACH 1 HOUR BEFORE OR 2 HOURS AFTER A MEAL, Disp: 120 tablet, Rfl: 0 .  amLODipine (NORVASC) 5 MG tablet, TAKE 2 TABLETS BY MOUTH EVERY DAY, Disp: 90 tablet, Rfl: 4 (NOT TAKING, HAS RUN OUT OF MEDICATION) .  buPROPion (WELLBUTRIN SR) 150 MG 12 hr tablet, Take 1 tablet (150 mg total) by mouth 2 (two) times daily., Disp: 180 tablet, Rfl: 4 .  diphenoxylate-atropine (LOMOTIL) 2.5-0.025 MG tablet, Take 1 tablet by mouth 4 (four) times daily as needed for diarrhea or loose stools., Disp: 120 tablet, Rfl: 3 .  feeding supplement, ENSURE ENLIVE, (ENSURE ENLIVE) LIQD, Take 237 mLs by mouth 3 (three) times daily between meals., Disp: 237 mL, Rfl: 12 .  furosemide (LASIX) 20 MG tablet, Take 1 tablet (20 mg total) by mouth daily as needed for fluid. (Patient taking differently: Take 20 mg by mouth daily. ), Disp: 30 tablet, Rfl: 2 .  HYDROcodone-acetaminophen (NORCO) 10-325 MG tablet, Take 1 tablet by mouth every 3 (three) hours as needed. For cancer related pain Dx: C61, Disp: 120 tablet,  Rfl: 0 .  ondansetron (ZOFRAN) 8 MG tablet, TAKE 1 TABLET BY MOUTH EVERY 8 HOURS AS NEEDED FOR NAUSEA, Disp: 30 tablet, Rfl: 5 .  Oxycodone HCl 10 MG TABS, One tablet every six hours scheduled, and one every three hours as needed for breakthrough pain, Disp: 120 tablet, Rfl: 0 .  predniSONE (DELTASONE) 5 MG tablet, TAKE 1 TABLET BY MOUTH TWICE DAILY WITH MEALS, Disp: 60 tablet, Rfl: 2 .  sodium polystyrene (KAYEXALATE) powder, Take 15mg  twice a day, Disp: 454 g, Rfl: 0  Review of Systems  Constitutional: Negative for appetite change, chills and fever.  Respiratory: Negative for chest tightness, shortness of breath and wheezing.   Cardiovascular: Negative for chest pain and palpitations.  Gastrointestinal: Negative for abdominal pain, nausea and vomiting.  Genitourinary:       Dark colored urine    Social History   Tobacco Use  . Smoking status: Current Every Day Smoker    Packs/day: 0.50    Years: 48.00    Pack years: 24.00    Types: Cigars  . Smokeless tobacco: Never Used  . Tobacco comment: used to smoke 2PPD- lately cut down to 1/2 PPD (cigars)  Substance Use Topics  . 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  Objective:   BP (!) 190/74 (BP Location: Right Arm, Cuff Size: Normal)   Pulse 86   Temp 98.5 F (36.9 C) (Oral)   Resp 16   Wt 158 lb (71.7 kg)   SpO2 98% Comment: room air  BMI 22.04 kg/m  Vitals:   07/10/18 0959 07/10/18 1006  BP: (!) 172/90 (!) 190/74  Pulse: 86   Resp: 16   Temp: 98.5 F (36.9 C)   TempSrc: Oral   SpO2: 98%   Weight: 158 lb (71.7 kg)      Physical Exam   General Appearance:    Alert, cooperative, no distress  Eyes:    PERRL, conjunctiva/corneas clear, EOM's intact       Lungs:     Clear to auscultation bilaterally, respirations unlabored  Heart:    Regular rate and rhythm  Neurologic:   Awake, alert, oriented x 3. No apparent focal neurological            defect.       Results for orders placed or performed in visit on 07/10/18  POCT Urinalysis Dipstick  Result Value Ref Range   Color, UA yellow    Clarity, UA cloudy    Glucose, UA Negative Negative   Bilirubin, UA negative    Ketones, UA negative    Spec Grav, UA 1.020 1.010 - 1.025   Blood, UA Large (Hemolyzed)    pH, UA 6.0 5.0 - 8.0   Protein, UA Positive (A) Negative   Urobilinogen, UA 0.2 0.2 or 1.0 E.U./dL   Nitrite, UA negative    Leukocytes, UA Moderate (2+) (A) Negative   Appearance     Odor         Assessment & Plan    1. Urinary tract infection with hematuria, site unspecified  - Urine Culture - cephALEXin (KEFLEX) 500 MG capsule; Take 1 capsule (500 mg total) by mouth 4 (four) times daily for 7 days.  Dispense: 28 capsule; Refill: 0  2. Essential hypertension Had been much better before running out of amlodipine, which is refilled today.  - amLODipine (NORVASC) 10 MG tablet; Take 1 tablet (10 mg total) by mouth daily.  Dispense: 90 tablet; Refill: 3  3. Prostate cancer ([place])   4. Chronic pain due to neoplasm refill- HYDROcodone-acetaminophen (NORCO) 10-325 MG tablet; Take 1 tablet by mouth every 3 (three) hours as needed. For cancer related pain Dx: C61  Dispense: 120 tablet; Refill: 0 - Oxycodone HCl 10 MG TABS; One tablet every six hours scheduled, and one every three hours as needed for breakthrough pain  Dispense: 120 tablet; Refill: 0     [name], MD  [facility]

## 2018-07-10 NOTE — Patient Instructions (Signed)
.   Please review the attached list of medications and notify my office if there are any errors.   . Please bring all of your medications to every appointment so we can make sure that our medication list is the same as yours.   

## 2018-07-11 ENCOUNTER — Inpatient Hospital Stay: Payer: Medicare Other | Attending: Hematology and Oncology

## 2018-07-11 DIAGNOSIS — C61 Malignant neoplasm of prostate: Secondary | ICD-10-CM | POA: Diagnosis not present

## 2018-07-11 DIAGNOSIS — C7951 Secondary malignant neoplasm of bone: Secondary | ICD-10-CM | POA: Insufficient documentation

## 2018-07-11 LAB — HEPATIC FUNCTION PANEL
ALT: 10 U/L (ref 0–44)
AST: 12 U/L — ABNORMAL LOW (ref 15–41)
Albumin: 3.8 g/dL (ref 3.5–5.0)
Alkaline Phosphatase: 327 U/L — ABNORMAL HIGH (ref 38–126)
Bilirubin, Direct: <0.1 mg/dL (ref 0.0–0.2)
Total Bilirubin: 0.5 mg/dL (ref 0.3–1.2)
Total Protein: 6.8 g/dL (ref 6.5–8.1)

## 2018-07-12 LAB — URINE CULTURE: Organism ID, Bacteria: NO GROWTH

## 2018-07-13 MED FILL — ABIRATERONE ACETATE 250 MG: 250 | 30 days supply | Qty: 120 | Fill #0

## 2018-07-13 MED FILL — predniSONE 5 MG TABS: 5 | 30 days supply | Qty: 60 | Fill #1

## 2018-07-26 ENCOUNTER — Inpatient Hospital Stay (HOSPITAL_BASED_OUTPATIENT_CLINIC_OR_DEPARTMENT_OTHER): Payer: Medicare Other | Admitting: Hematology and Oncology

## 2018-07-26 ENCOUNTER — Inpatient Hospital Stay: Payer: Medicare Other

## 2018-07-26 ENCOUNTER — Encounter: Payer: Self-pay | Admitting: Hematology and Oncology

## 2018-07-26 VITALS — BP 195/85 | HR 85 | Temp 96.6°F | Resp 18 | Wt 156.3 lb

## 2018-07-26 DIAGNOSIS — C7951 Secondary malignant neoplasm of bone: Secondary | ICD-10-CM | POA: Diagnosis not present

## 2018-07-26 DIAGNOSIS — N289 Disorder of kidney and ureter, unspecified: Secondary | ICD-10-CM

## 2018-07-26 DIAGNOSIS — I10 Essential (primary) hypertension: Secondary | ICD-10-CM

## 2018-07-26 DIAGNOSIS — G893 Neoplasm related pain (acute) (chronic): Secondary | ICD-10-CM | POA: Diagnosis not present

## 2018-07-26 DIAGNOSIS — Z87891 Personal history of nicotine dependence: Secondary | ICD-10-CM | POA: Diagnosis not present

## 2018-07-26 DIAGNOSIS — C61 Malignant neoplasm of prostate: Secondary | ICD-10-CM

## 2018-07-26 LAB — CBC WITH DIFFERENTIAL/PLATELET
Abs Immature Granulocytes: 0.03 10*3/uL (ref 0.00–0.07)
Basophils Absolute: 0.1 10*3/uL (ref 0.0–0.1)
Basophils Relative: 1 %
Eosinophils Absolute: 0.2 10*3/uL (ref 0.0–0.5)
Eosinophils Relative: 3 %
HCT: 39.2 % (ref 39.0–52.0)
Hemoglobin: 13.2 g/dL (ref 13.0–17.0)
Immature Granulocytes: 0 %
Lymphocytes Relative: 25 %
Lymphs Abs: 2.1 10*3/uL (ref 0.7–4.0)
MCH: 32.5 pg (ref 26.0–34.0)
MCHC: 33.7 g/dL (ref 30.0–36.0)
MCV: 96.6 fL (ref 80.0–100.0)
Monocytes Absolute: 0.6 10*3/uL (ref 0.1–1.0)
Monocytes Relative: 7 %
Neutro Abs: 5.5 10*3/uL (ref 1.7–7.7)
Neutrophils Relative %: 64 %
Platelets: 227 10*3/uL (ref 150–400)
RBC: 4.06 MIL/uL — ABNORMAL LOW (ref 4.22–5.81)
RDW: 13.5 % (ref 11.5–15.5)
WBC: 8.5 10*3/uL (ref 4.0–10.5)
nRBC: 0 % (ref 0.0–0.2)

## 2018-07-26 LAB — COMPREHENSIVE METABOLIC PANEL
ALT: 9 U/L (ref 0–44)
AST: 15 U/L (ref 15–41)
Albumin: 4.2 g/dL (ref 3.5–5.0)
Alkaline Phosphatase: 326 U/L — ABNORMAL HIGH (ref 38–126)
Anion gap: 11 (ref 5–15)
BUN: 48 mg/dL — ABNORMAL HIGH (ref 8–23)
CO2: 22 mmol/L (ref 22–32)
Calcium: 9.1 mg/dL (ref 8.9–10.3)
Chloride: 106 mmol/L (ref 98–111)
Creatinine, Ser: 2.5 mg/dL — ABNORMAL HIGH (ref 0.61–1.24)
GFR calc Af Amer: 31 mL/min — ABNORMAL LOW (ref >60)
GFR calc non Af Amer: 26 mL/min — ABNORMAL LOW (ref >60)
Glucose, Bld: 96 mg/dL (ref 70–99)
Potassium: 4.7 mmol/L (ref 3.5–5.1)
Sodium: 139 mmol/L (ref 135–145)
Total Bilirubin: 0.5 mg/dL (ref 0.3–1.2)
Total Protein: 7.4 g/dL (ref 6.5–8.1)

## 2018-07-26 LAB — PSA: Prostatic Specific Antigen: 101.37 ng/mL — ABNORMAL HIGH (ref 0.00–4.00)

## 2018-07-26 NOTE — Progress Notes (Signed)
[facility] day:  07/26/2018   Chief Complaint: Michael Small is a 63 y.o. male with metastatic prostate cancer who is seen for 1 month assessment on abiraterone.  HPI:   The patient was last seen in the medical oncology clinic on 06/27/2018.   At that time, he was doing well.  Pain was well controlled.  Appetite was good.  He had gained 4 pounds.  He had vasomotor symptoms.  Exam was stable. PSA was 116.62.  He received Lupron.  During the interim, patient is doing well today. He does not make note of any acute or concerning symptoms. He feels generally well. Patient denies that he has experienced any B symptoms. His energy continues to improve, as does his stamina. He continues to have issues with exertional shortness of breath.   Patient makes mention of recent UTI that was diagnosed on 07/10/2018. UA reviewed; (+) for 2+ leukocytes and protein. C&S (-). He was treated by his PCP Michael Section, MD) with a 7 day course of cephalexin 500 mg QID.  Patient advises that he maintains an adequate appetite. He is eating well. He attributes his improved appetite to the low dose prednisone. Weight today is 156 lb 4.9 oz (70.9 kg), which compared to his last visit to the clinic, represents a 3 pound increase.    Patient denies pain in the clinic today.   Past Medical History:  Diagnosis Date  . Chronic kidney disease   . Depression   . GERD (gastroesophageal reflux disease)   . Hyperlipidemia   . Hypertension   . Myocardial infarction [facility])    s/p stent  . Neuromuscular disorder (HCC)    numbness in arms and legs  . Prostate cancer [facility])     Past Surgical History:  Procedure Laterality Date  . CORONARY ANGIOPLASTY WITH STENT PLACEMENT  12/2011   [facility]  . CYSTOSCOPY W/ RETROGRADES Bilateral 05/24/2017   Procedure: CYSTOSCOPY WITH RETROGRADE PYELOGRAM;  Surgeon: [name], MD;  Location: [facility];  Service: Urology;  Laterality:  Bilateral;  . CYSTOSCOPY W/ RETROGRADES Bilateral 11/21/2017   Procedure: CYSTOSCOPY WITH RETROGRADE PYELOGRAM;  Surgeon: [name], MD;  Location: [facility];  Service: Urology;  Laterality: Bilateral;  . CYSTOSCOPY W/ RETROGRADES Bilateral 03/20/2018   Procedure: CYSTOSCOPY WITH RETROGRADE PYELOGRAM;  Surgeon: [name], MD;  Location: [facility];  Service: Urology;  Laterality: Bilateral;  . CYSTOSCOPY W/ URETERAL STENT PLACEMENT Bilateral 11/21/2017   Procedure: CYSTOSCOPY WITH STENT REPLACEMENT;  Surgeon: [name], MD;  Location: [facility];  Service: Urology;  Laterality: Bilateral;  . CYSTOSCOPY W/ URETERAL STENT REMOVAL Bilateral 11/21/2017   Procedure: CYSTOSCOPY WITH STENT REMOVAL;  Surgeon: [name], MD;  Location: [facility];  Service: Urology;  Laterality: Bilateral;  . CYSTOSCOPY WITH STENT PLACEMENT Right 05/24/2017   Procedure: CYSTOSCOPY WITH STENT PLACEMENT;  Surgeon: [name], MD;  Location: [facility];  Service: Urology;  Laterality: Right;  . CYSTOSCOPY WITH STENT PLACEMENT Bilateral 03/20/2018   Procedure: CYSTOSCOPY WITH STENT EXCHANGE;  Surgeon: [name], MD;  Location: [facility];  Service: Urology;  Laterality: Bilateral;  . IR NEPHROSTOGRAM LEFT THRU EXISTING ACCESS  07/21/2017  . IR NEPHROSTOGRAM LEFT THRU EXISTING ACCESS  08/18/2017  . IR NEPHROSTOGRAM RIGHT THRU EXISTING ACCESS  08/18/2017  . IR NEPHROSTOMY PLACEMENT LEFT  06/15/2017  . IR NEPHROSTOMY PLACEMENT RIGHT  06/14/2017  . IR URETERAL STENT PLACEMENT EXISTING ACCESS  LEFT  07/21/2017  . PICC LINE INSERTION N/A 08/02/2017   Procedure: PICC LINE INSERTION;  Surgeon: Katha Cabal, MD;  Location: [facility];  Service: Cardiovascular;  Laterality: N/A;  . PROSTATE BIOPSY N/A 05/24/2017   Procedure: PROSTATE BIOPSY;  Surgeon: [name], MD;  Location: [facility];  Service: Urology;  Laterality: N/A;  . TEE WITHOUT CARDIOVERSION N/A 07/31/2017   Procedure:  TRANSESOPHAGEAL ECHOCARDIOGRAM (TEE);  Surgeon: Teodoro Spray, MD;  Location: [facility];  Service: Cardiovascular;  Laterality: N/A;  . US ECHOCARDIOGRAPHY  12/2011   Mild LV dysfunction, EF=45%    Family History  Problem Relation Age of Onset  . Cancer Mother   . Hypertension Mother   . Diabetes Father   . Heart disease Father   . Cancer Paternal Grandfather   . Prostate cancer Neg Hx   . Bladder Cancer Neg Hx   . Kidney cancer Neg Hx     Social History:  reports that he has been smoking cigars. He has a 24.00 pack-year smoking history. He has never used smokeless tobacco. He reports that he does not drink alcohol or use drugs.  He has smoked 1 pack per day since age 2 (28 years).  He smokes small cigars.  He does not drink alcohol, but previously drank heavily.  He describes being exposed to Round Up and asbestos.  He has been disabled for several years secondary to "nerve damage and problems in his legs, arms, and bad back".  He fell off a ladder.  He previously worked for a [employer], [employer].  He lives in [place].  He has step children.  The patient is alone today.  Allergies:  Allergies  Allergen Reactions  . Zytiga [Abiraterone] Diarrhea    Current Medications: Current Outpatient Medications  Medication Sig Dispense Refill  . abiraterone acetate (ZYTIGA) 250 MG tablet TAKE 4 TABLETS (1,000 MG TOTAL) BY MOUTH DAILY. TAKE ON AN EMPTY STOMACH 1 HOUR BEFORE OR 2 HOURS AFTER A MEAL 120 tablet 0  . amLODipine (NORVASC) 10 MG tablet Take 1 tablet (10 mg total) by mouth daily. 90 tablet 3  . buPROPion (WELLBUTRIN SR) 150 MG 12 hr tablet Take 1 tablet (150 mg total) by mouth 2 (two) times daily. 180 tablet 4  . diphenoxylate-atropine (LOMOTIL) 2.5-0.025 MG tablet Take 1 tablet by mouth 4 (four) times daily as needed for diarrhea or loose stools. 120 tablet 3  . HYDROcodone-acetaminophen (NORCO) 10-325 MG tablet Take 1 tablet by mouth every 3 (three) hours as needed. For cancer  related pain Dx: C61 120 tablet 0  . ondansetron (ZOFRAN) 8 MG tablet TAKE 1 TABLET BY MOUTH EVERY 8 HOURS AS NEEDED FOR NAUSEA 30 tablet 5  . Oxycodone HCl 10 MG TABS One tablet every six hours scheduled, and one every three hours as needed for breakthrough pain 120 tablet 0  . predniSONE (DELTASONE) 5 MG tablet TAKE 1 TABLET BY MOUTH TWICE DAILY WITH MEALS 60 tablet 2  . sodium polystyrene (KAYEXALATE) powder Take 15mg  twice a day (Patient not taking: Reported on 07/26/2018) 454 g 0   No current facility-administered medications for this visit.     Review of Systems  Constitutional: Negative.  Negative for chills, diaphoresis, fever, malaise/fatigue and weight loss (up 3 pounds).       Feels "fine".  HENT: Negative.  Negative for congestion, ear discharge, ear pain, nosebleeds, sinus pain and sore throat.   Eyes: Negative.  Negative for blurred vision, double vision  and photophobia.  Respiratory: Positive for shortness of breath (exertional). Negative for cough, hemoptysis, sputum production and wheezing.   Cardiovascular: Negative for chest pain, palpitations, orthopnea, leg swelling and PND.       H/o myocardial infarction.  Gastrointestinal: Negative for abdominal pain, blood in stool, constipation, diarrhea, heartburn, melena, nausea and vomiting.       Prednisone increases appetite.  Genitourinary: Negative for dysuria, frequency, hematuria and urgency.       S/p nephrostomy tubes.  Musculoskeletal: Positive for back pain (sacral pain- chronic). Negative for falls, joint pain, myalgias and neck pain.  Skin: Negative.  Negative for itching and rash.  Neurological: Negative.  Negative for dizziness, tingling, tremors, sensory change, speech change, focal weakness, weakness and headaches.  Endo/Heme/Allergies: Bruises/bleeds easily.       Vasomotor symptoms.  Psychiatric/Behavioral: Negative for depression and memory loss. The patient has insomnia (secondary to night sweats). The patient  is not nervous/anxious.   All other systems reviewed and are negative.  Performance status (ECOG): 2  Vital Signs: BP (!) 195/85 (BP Location: Left Arm, Patient Position: Sitting)   Pulse 85   Temp (!) 96.6 F (35.9 C) (Tympanic)   Resp 18   Wt 156 lb 4.9 oz (70.9 kg)   SpO2 99%   BMI 21.80 kg/m   Physical Exam  Constitutional: He is oriented to person, place, and time.  Slightly fatigued appearing gentleman sitting comfortably in the exam room in no acute distress.  HENT:  Head: Normocephalic and atraumatic.  Mouth/Throat: Oropharynx is clear and moist and mucous membranes are normal. No oropharyngeal exudate.  Near alopecia.  Scruffy beard.  Edentulous.  Eyes: Pupils are equal, round, and reactive to light. Conjunctivae and EOM are normal. No scleral icterus.  Neck: Normal range of motion. Neck supple. No JVD present.  Cardiovascular: Normal rate, regular rhythm, normal heart sounds and intact distal pulses. Exam reveals no gallop and no friction rub.  No murmur heard. Pulmonary/Chest: Effort normal and breath sounds normal. No respiratory distress. He has no wheezes. He has no rales.  Abdominal: Soft. Bowel sounds are normal. He exhibits no distension and no mass. There is no abdominal tenderness. There is no rebound and no guarding.  Musculoskeletal: Normal range of motion.        General: Edema (chronic mild lower extremity edema) present. No tenderness.  Lymphadenopathy:    He has no cervical adenopathy.    He has no axillary adenopathy.       Right: No supraclavicular adenopathy present.       Left: No supraclavicular adenopathy present.  Neurological: He is alert and oriented to person, place, and time. Gait normal.  Skin: Skin is warm and dry. No rash noted. He is not diaphoretic. No erythema. No pallor.  Psychiatric: Mood, affect and judgment normal.  Smiling.   Nursing note and vitals reviewed.   Appointment on 07/26/2018  Component Date Value Ref Range Status   . Sodium 07/26/2018 139  135 - 145 mmol/L Final  . Potassium 07/26/2018 4.7  3.5 - 5.1 mmol/L Final  . Chloride 07/26/2018 106  98 - 111 mmol/L Final  . CO2 07/26/2018 22  22 - 32 mmol/L Final  . Glucose, Bld 07/26/2018 96  70 - 99 mg/dL Final  . BUN 07/26/2018 48* 8 - 23 mg/dL Final  . Creatinine, Ser 07/26/2018 2.50* 0.61 - 1.24 mg/dL Final  . Calcium 07/26/2018 9.1  8.9 - 10.3 mg/dL Final  . Total Protein 07/26/2018 7.4  6.5 - 8.1 g/dL Final  . Albumin 07/26/2018 4.2  3.5 - 5.0 g/dL Final  . AST 07/26/2018 15  15 - 41 U/L Final  . ALT 07/26/2018 9  0 - 44 U/L Final  . Alkaline Phosphatase 07/26/2018 326* 38 - 126 U/L Final  . Total Bilirubin 07/26/2018 0.5  0.3 - 1.2 mg/dL Final  . GFR calc non Af Amer 07/26/2018 26* >60 mL/min Final  . GFR calc Af Amer 07/26/2018 31* >60 mL/min Final  . Anion gap 07/26/2018 11  5 - 15 Final   Performed at [facility], [address], [place], [place] [address]  . WBC 07/26/2018 8.5  4.0 - 10.5 K/uL Final  . RBC 07/26/2018 4.06* 4.22 - 5.81 MIL/uL Final  . Hemoglobin 07/26/2018 13.2  13.0 - 17.0 g/dL Final  . HCT 07/26/2018 39.2  39.0 - 52.0 % Final  . MCV 07/26/2018 96.6  80.0 - 100.0 fL Final  . MCH 07/26/2018 32.5  26.0 - 34.0 pg Final  . MCHC 07/26/2018 33.7  30.0 - 36.0 g/dL Final  . RDW 07/26/2018 13.5  11.5 - 15.5 % Final  . Platelets 07/26/2018 227  150 - 400 K/uL Final  . nRBC 07/26/2018 0.0  0.0 - 0.2 % Final  . Neutrophils Relative % 07/26/2018 64  % Final  . Neutro Abs 07/26/2018 5.5  1.7 - 7.7 K/uL Final  . Lymphocytes Relative 07/26/2018 25  % Final  . Lymphs Abs 07/26/2018 2.1  0.7 - 4.0 K/uL Final  . Monocytes Relative 07/26/2018 7  % Final  . Monocytes Absolute 07/26/2018 0.6  0.1 - 1.0 K/uL Final  . Eosinophils Relative 07/26/2018 3  % Final  . Eosinophils Absolute 07/26/2018 0.2  0.0 - 0.5 K/uL Final  . Basophils Relative 07/26/2018 1  % Final  . Basophils Absolute 07/26/2018 0.1  0.0 - 0.1 K/uL Final  .  Immature Granulocytes 07/26/2018 0  % Final  . Abs Immature Granulocytes 07/26/2018 0.03  0.00 - 0.07 K/uL Final   Performed at [facility], [address] [place], [place] [address]    Assessment:  Michael Small is a 63 y.o. male with metastatic prostate cancer s/p transrectal prostate biopsies and cystoscopy on 05/24/2017.  Prostate biopsies revealed acinar adenocarcinoma involving all 12 cores.  Adenocarcinoma involved colonic mucosa and skeletal muscle.  Gleason score was 9 (4+5), grade group 5.  Pathologic stage was T4N1M1c.  PSA was 2338.8 on 04/24/2017.  Abdomen and pelvic CT on 04/27/2017 revealed prominent pelvic adenopathy with pathologic retroperitoneal adenopathy, abnormal retroperitoneal stranding, and abnormal inflammatory type stranding along the pelvic sidewalls and perirectal space.  A representative right external iliac lymph node measured 3.2 cm.  A left external iliac lymph node measured 2.1 cm.  There was conglomerate left periaortic adenopathy 2.1 cm and a 1.4 cm aortocaval node.   There was abnormal adenopathy in the perirectal space with prominent wall thickening in the rectum and moderate wall thickening in the sigmoid colon. The findings along the pelvic sidewalls appeared to be causing distal ureteral obstruction resulting in moderate bilateral hydronephrosis and hydroureter.  Lymphoma or prostate cancer was favored. A right external iliac node measured 3.2 cm.  There was bladder wall thickening suggesting cystitis.  There was prostatomegaly.  Bone scan on 06/22/2017 revealed findings consistent with diffuse metastatic disease.  There was activity in right temporal area, sternum, anterior and posterior ribs bilaterally, and entire spine.  Non-contrast chest CT on 06/27/2017 revealed  right infrahilar prominence, progressed from prior CT, worrisome for a node/nodule, but poorly visualized given the lack of IV contrast.  There was mid thoracic lymphadenopathy,  progressed, including a dominant 14 mm short axis node at the left thoracic inlet.  There was stable 19 x 9 mm irregular nodule at the right lung apex.  There was a sclerotic osseous metastases at T8-9.  Bone scan on 03/09/2018 revealed widespread osseous metastatic disease with super scan appearance, progressive since prior exam.  Abdomen and pelvis CT on 03/14/2018 revealed interval progression of widespread sclerotic bony metastatic involvement with new areas of lucency in the axial skeleton and bony pelvis.  There was evidence of interval renal atrophy, right greater than left. There was mild persistent right hydronephrosis.  There was interval removal of percutaneous nephrostomy catheters with internal ureteral stents visualized bilaterally in situ.  There was mild lymphadenopathy in the pelvic sidewall bilaterally.  There was diffuse body wall, retroperitoneal, and mesenteric edema.  There was persistent circumferential wall thickening in the distal rectum.  He received Lupron 22.5 mg every 3 months (06/01/2017; 10/02/2017; last 06/27/2018) and Degarelix 240 mg on 06/15/2017.  He received Casodex from 06/13/2017 - 06/15/2017.    He began abiraterone on 02/28/2018 and discontinued after 2 days secondary to nausea, vomiting, diarrhea, and fatigue.  He restarted abiraterone (1 pill/day) on 03/16/2018 and increased to 2 pills/day on 03/23/2018.  He increased too 4 tablets in mid 03/2018.  Testosterone was 98 on 06/14/2017, < 3 on 09/07/2017, and < 3 on 03/16/2018.  PSA has been followed:  2338.8 on 04/24/2017, 3603.0 on 06/23/2017, 1035 on 07/26/2017, 241 on 09/07/2017, 342 on 01/23/2018, 799 on 10-23-2017, 107.14 on 04/24/2018, 116.03 on 05/22/2018, 116.52 on 06/27/2018, and 101.37 on 07/26/2018.  He was admitted to [facility] from 06/12/2017 - 06/15/2017 with hyperkalemia and acute renal failure.  Creatinine was 7.23.  He underwent emergent dialysis x1. He is s/p bilateral external nephrostomy tube  placement on 06/14/2017.  He was admitted to [facility] from 07/28/2017 - 07/31/2017 with sepsis secondary to UTI with MSSA bacteremia.    Bilateral lower extremity duplex on 06/12/2017 revealed no evidence of DVT.   Code status is DNR/DNI.  Symptomatically, he feels "fine".  Pain is controlled.  He has trouble sleeping secondary to vasomotor symptoms.  Exam is stable.  PSA is 101.37 (improved).  Plan: 1. Labs today:  CBC with diff, CMP, PSA. 2. Metastatic prostate cancer  He continues to do well.  Review trend in PSA. Continue abiraterone and prednisone. Discuss checking LFTs every month (instead of every 2 weeks). Continue Lupron every 3 months (last 06/27/2018). Restaging studies scheduled 09/08/2018. Discuss symptom management.  Interventions are adequate.    3. Renal insufficiency Creatinine 2.50 (increased). Renal ultrasound. 4. Vasomotor symptoms  Secondary to ongoing hormonal therapy.  Consider Effexor. 5. RTC in 1 month for MD assessment and labs (CBC with diff, CMP, PSA).   Honor Loh, NP  07/26/2018, 9:41 AM  I saw and evaluated the patient, participating in the key portions of the service and reviewing pertinent diagnostic studies and records.  I reviewed the nurse practitioner's note and agree with the findings and the plan.  The assessment and plan were discussed with the patient.  A few questions were asked by the patient and answered.   Nolon Stalls, MD 07/26/2018,9:41 AM

## 2018-07-26 NOTE — Progress Notes (Signed)
Pt here for follow up. Reports increased sweating at night which causes him to wake multiple times. Reports this has been happening for quite some time now.

## 2018-07-27 ENCOUNTER — Ambulatory Visit
Admission: RE | Admit: 2018-07-27 | Discharge: 2018-07-27 | Disposition: A | Discharge status: Home / Self Care | Payer: Medicare Other | Source: Ambulatory Visit | Attending: Urgent Care | Admitting: Urgent Care

## 2018-07-27 DIAGNOSIS — N289 Disorder of kidney and ureter, unspecified: Secondary | ICD-10-CM | POA: Diagnosis not present

## 2018-07-27 DIAGNOSIS — C61 Malignant neoplasm of prostate: Secondary | ICD-10-CM | POA: Diagnosis not present

## 2018-07-27 DIAGNOSIS — C7951 Secondary malignant neoplasm of bone: Secondary | ICD-10-CM | POA: Diagnosis not present

## 2018-08-06 ENCOUNTER — Other Ambulatory Visit: Payer: Self-pay | Admitting: Urgent Care

## 2018-08-06 ENCOUNTER — Other Ambulatory Visit: Payer: Self-pay | Admitting: Family Medicine

## 2018-08-06 DIAGNOSIS — G893 Neoplasm related pain (acute) (chronic): Secondary | ICD-10-CM

## 2018-08-06 DIAGNOSIS — C61 Malignant neoplasm of prostate: Secondary | ICD-10-CM

## 2018-08-06 MED ORDER — OXYCODONE HCL 10 MG PO TABS: ORAL_TABLET | ORAL | 0 refills | Status: DC

## 2018-08-06 MED ORDER — HYDROCODONE-ACETAMINOPHEN 10-325 MG PO TABS: 1.0000 | ORAL_TABLET | ORAL | 0 refills | Status: DC | PRN

## 2018-08-06 NOTE — Telephone Encounter (Signed)
Needs refill on   Oxycodone 10 mg  Hydrocodone 10-325  Wife ask that we put on the refill that it is for cancer pain  [facility] [address]  Thanks [employer]

## 2018-08-14 ENCOUNTER — Encounter: Payer: Self-pay | Admitting: Hematology and Oncology

## 2018-08-15 MED FILL — predniSONE 5 MG TABS: 5 | 30 days supply | Qty: 60 | Fill #2

## 2018-08-15 MED FILL — ABIRATERONE ACETATE 250 MG: 250 | 30 days supply | Qty: 120 | Fill #0

## 2018-08-28 ENCOUNTER — Other Ambulatory Visit: Payer: Self-pay

## 2018-08-28 ENCOUNTER — Telehealth: Payer: Self-pay

## 2018-08-28 ENCOUNTER — Inpatient Hospital Stay: Payer: Medicare Other | Attending: Hematology and Oncology

## 2018-08-28 DIAGNOSIS — C61 Malignant neoplasm of prostate: Secondary | ICD-10-CM

## 2018-08-28 DIAGNOSIS — N289 Disorder of kidney and ureter, unspecified: Secondary | ICD-10-CM

## 2018-08-28 DIAGNOSIS — G893 Neoplasm related pain (acute) (chronic): Secondary | ICD-10-CM

## 2018-08-28 DIAGNOSIS — C7951 Secondary malignant neoplasm of bone: Secondary | ICD-10-CM

## 2018-08-28 LAB — COMPREHENSIVE METABOLIC PANEL
ALT: 11 U/L (ref 0–44)
AST: 15 U/L (ref 15–41)
Albumin: 4 g/dL (ref 3.5–5.0)
Alkaline Phosphatase: 367 U/L — ABNORMAL HIGH (ref 38–126)
Anion gap: 8 (ref 5–15)
BUN: 32 mg/dL — ABNORMAL HIGH (ref 8–23)
CO2: 25 mmol/L (ref 22–32)
Calcium: 9.1 mg/dL (ref 8.9–10.3)
Chloride: 100 mmol/L (ref 98–111)
Creatinine, Ser: 2.23 mg/dL — ABNORMAL HIGH (ref 0.61–1.24)
GFR calc Af Amer: 35 mL/min — ABNORMAL LOW (ref >60)
GFR calc non Af Amer: 30 mL/min — ABNORMAL LOW (ref >60)
Glucose, Bld: 107 mg/dL — ABNORMAL HIGH (ref 70–99)
Potassium: 4.7 mmol/L (ref 3.5–5.1)
Sodium: 133 mmol/L — ABNORMAL LOW (ref 135–145)
Total Bilirubin: 0.4 mg/dL (ref 0.3–1.2)
Total Protein: 7.5 g/dL (ref 6.5–8.1)

## 2018-08-28 LAB — CBC WITH DIFFERENTIAL/PLATELET
Abs Immature Granulocytes: 0.02 10*3/uL (ref 0.00–0.07)
Basophils Absolute: 0.1 10*3/uL (ref 0.0–0.1)
Basophils Relative: 1 %
Eosinophils Absolute: 0.2 10*3/uL (ref 0.0–0.5)
Eosinophils Relative: 2 %
HCT: 39.2 % (ref 39.0–52.0)
Hemoglobin: 13.2 g/dL (ref 13.0–17.0)
Immature Granulocytes: 0 %
Lymphocytes Relative: 25 %
Lymphs Abs: 2.1 10*3/uL (ref 0.7–4.0)
MCH: 32.2 pg (ref 26.0–34.0)
MCHC: 33.7 g/dL (ref 30.0–36.0)
MCV: 95.6 fL (ref 80.0–100.0)
Monocytes Absolute: 0.5 10*3/uL (ref 0.1–1.0)
Monocytes Relative: 5 %
Neutro Abs: 5.6 10*3/uL (ref 1.7–7.7)
Neutrophils Relative %: 67 %
Platelets: 226 10*3/uL (ref 150–400)
RBC: 4.1 MIL/uL — ABNORMAL LOW (ref 4.22–5.81)
RDW: 13.8 % (ref 11.5–15.5)
WBC: 8.3 10*3/uL (ref 4.0–10.5)
nRBC: 0 % (ref 0.0–0.2)

## 2018-08-28 LAB — PSA: Prostatic Specific Antigen: 180 ng/mL — ABNORMAL HIGH (ref 0.00–4.00)

## 2018-08-28 MED ORDER — HYDROCODONE-ACETAMINOPHEN 10-325 MG PO TABS: 1.0000 | ORAL_TABLET | ORAL | 0 refills | Status: DC | PRN

## 2018-08-28 NOTE — Telephone Encounter (Signed)
Patient's wife called requesting a refill on HYDROcodone-acetaminophen (NORCO) 10-325 MG tablet [facility].

## 2018-08-29 ENCOUNTER — Other Ambulatory Visit: Payer: Medicare Other

## 2018-08-29 ENCOUNTER — Inpatient Hospital Stay: Payer: Medicare Other | Attending: Hematology and Oncology | Admitting: Hematology and Oncology

## 2018-08-29 VITALS — BP 132/89 | HR 96 | Temp 97.5°F | Resp 18 | Ht 71.0 in

## 2018-08-29 DIAGNOSIS — F17210 Nicotine dependence, cigarettes, uncomplicated: Secondary | ICD-10-CM | POA: Insufficient documentation

## 2018-08-29 DIAGNOSIS — N289 Disorder of kidney and ureter, unspecified: Secondary | ICD-10-CM

## 2018-08-29 DIAGNOSIS — C61 Malignant neoplasm of prostate: Secondary | ICD-10-CM

## 2018-08-29 DIAGNOSIS — I129 Hypertensive chronic kidney disease with stage 1 through stage 4 chronic kidney disease, or unspecified chronic kidney disease: Secondary | ICD-10-CM | POA: Insufficient documentation

## 2018-08-29 DIAGNOSIS — Z7189 Other specified counseling: Secondary | ICD-10-CM

## 2018-08-29 DIAGNOSIS — Z79899 Other long term (current) drug therapy: Secondary | ICD-10-CM | POA: Insufficient documentation

## 2018-08-29 DIAGNOSIS — G893 Neoplasm related pain (acute) (chronic): Secondary | ICD-10-CM

## 2018-08-29 DIAGNOSIS — N189 Chronic kidney disease, unspecified: Secondary | ICD-10-CM | POA: Insufficient documentation

## 2018-08-29 DIAGNOSIS — C7951 Secondary malignant neoplasm of bone: Secondary | ICD-10-CM | POA: Diagnosis not present

## 2018-08-29 DIAGNOSIS — Z5111 Encounter for antineoplastic chemotherapy: Secondary | ICD-10-CM | POA: Insufficient documentation

## 2018-08-29 NOTE — Progress Notes (Signed)
[facility]  [address] [place],  [address] Phone: [phone]  Fax: [phone]   Telephone Office Visit:  08/29/2018  I connected with Michael Small on 08/29/18 at 12:25 PM EDT by telephone and verified that I was speaking with the correct person using 2 identifiers.  The patient was at home.  I discussed the limitations, risk, security and privacy concerns of performing an evaluation and management service by telephone and  the availability of in person appointments.  I also discussed with the patient that there may be a patient responsible charge related to this service.  The patient expressed understanding and agreed to proceed.   Chief Complaint: Michael Small is a 63 y.o. male with metastatic prostate cancer who is evaluated for 1 month assessment on abiraterone.  HPI:  The patient was last seen on 07/26/2018.  At that time, he was doing well.  He denied any complaint.  He continued abiraterone.  PSA was 101.37 (improved from 116.52 on 06/27/2018).  Creatinine was 2.50 (baseline 2.08 - 2.36).  Renal ultrasound on 07/27/2018 revealed mild right kidney pelvicaliectasis and interval moderate atrophy from prior ultrasound. Findings stable from prior CT given differences in technique.  Left kidney length was decreased from prior ultrasound, possible mild interval atrophy versus sampling error. No left hydronephrosis.  During the interim, he notes increased pain in his left leg.  Pain has been present since diagnosis.  He notes that "sometimes the pain flares up".  His wife notes a sore spot on the "crown of the skull".  He denies any trauma.  He states that he is not very active.  He is staying home secondary to COVID-19.  He is taking oxycodone and hydrocodone/Tylenol for pain.  He takes 3-4 pills in a 24 hour period.  Pain will decrease from a level 10 to a 7.  Energy level is "not good" (chronic).  Bowel are "fine".  He takes Miralax.  He is  voiding "ok".  He denies any shortness of breath.   Past Medical History:  Diagnosis Date   Chronic kidney disease    Depression    GERD (gastroesophageal reflux disease)    Hyperlipidemia    Hypertension    Myocardial infarction [facility])    s/p stent   Neuromuscular disorder (HCC)    numbness in arms and legs   Prostate cancer [facility])     Past Surgical History:  Procedure Laterality Date   CORONARY ANGIOPLASTY WITH STENT PLACEMENT  12/2011   DES LAD [facility]   CYSTOSCOPY W/ RETROGRADES Bilateral 05/24/2017   Procedure: CYSTOSCOPY WITH RETROGRADE PYELOGRAM;  Surgeon: [name], MD;  Location: [facility] ORS;  Service: Urology;  Laterality: Bilateral;   CYSTOSCOPY W/ RETROGRADES Bilateral 11/21/2017   Procedure: CYSTOSCOPY WITH RETROGRADE PYELOGRAM;  Surgeon: [name], MD;  Location: [facility] OR[facility];  Service: Urology;  Laterality: Bilateral;   CYSTOSCOPY W/ RETROGRADES Bilateral 03/20/2018   Procedure: CYSTOSCOPY WITH RETROGRADE PYELOGRAM;  Surgeon: [name], MD;  Location: [facility] ORS;  Service: Urology;  Laterality: Bilateral;   CYSTOSCOPY W/ URETERAL STENT PLACEMENT Bilateral 11/21/2017   Procedure: CYSTOSCOPY WITH STENT REPLACEMENT;  Surgeon: [name], MD;  Location: [facility] OR[facility];  Service: Urology;  Laterality: Bilateral;   CYSTOSCOPY W/ URETERAL STENT REMOVAL Bilateral 11/21/2017   Procedure: CYSTOSCOPY WITH STENT REMOVAL;  Surgeon: [name], MD;  Location: [facility] OR[facility];  Service: Urology;  Laterality: Bilateral;   CYSTOSCOPY WITH STENT PLACEMENT Right 05/24/2017  Procedure: CYSTOSCOPY WITH STENT PLACEMENT;  Surgeon: [name], MD;  Location: [facility];  Service: Urology;  Laterality: Right;   CYSTOSCOPY WITH STENT PLACEMENT Bilateral 03/20/2018   Procedure: CYSTOSCOPY WITH STENT EXCHANGE;  Surgeon: [name], MD;  Location: [facility] OR[facility];  Service: Urology;  Laterality: Bilateral;   IR NEPHROSTOGRAM LEFT THRU EXISTING ACCESS  07/21/2017     IR NEPHROSTOGRAM LEFT THRU EXISTING ACCESS  08/18/2017   IR NEPHROSTOGRAM RIGHT THRU EXISTING ACCESS  08/18/2017   IR NEPHROSTOMY PLACEMENT LEFT  06/15/2017   IR NEPHROSTOMY PLACEMENT RIGHT  06/14/2017   IR URETERAL STENT PLACEMENT EXISTING ACCESS LEFT  07/21/2017   PICC LINE INSERTION N/A 08/02/2017   Procedure: PICC LINE INSERTION;  Surgeon: Katha Cabal, MD;  Location: [facility];  Service: Cardiovascular;  Laterality: N/A;   PROSTATE BIOPSY N/A 05/24/2017   Procedure: PROSTATE BIOPSY;  Surgeon: [name], MD;  Location: [facility];  Service: Urology;  Laterality: N/A;   TEE WITHOUT CARDIOVERSION N/A 07/31/2017   Procedure: TRANSESOPHAGEAL ECHOCARDIOGRAM (TEE);  Surgeon: Teodoro Spray, MD;  Location: [facility];  Service: Cardiovascular;  Laterality: N/A;   US ECHOCARDIOGRAPHY  12/2011   Mild LV dysfunction, EF=45%    Family History  Problem Relation Age of Onset   Cancer Mother    Hypertension Mother    Diabetes Father    Heart disease Father    Cancer Paternal Grandfather    Prostate cancer Neg Hx    Bladder Cancer Neg Hx    Kidney cancer Neg Hx     Social History:  reports that he has been smoking cigars. He has a 24.00 pack-year smoking history. He has never used smokeless tobacco. He reports that he does not drink alcohol or use drugs.  He has smoked 1 pack per day since age 77 (23 years).  He smokes small cigars.  He does not drink alcohol, but previously drank heavily.  He describes being exposed to Round Up and asbestos.  He has been disabled for several years secondary to "nerve damage and problems in his legs, arms, and bad back".  He fell off a ladder.  He previously worked for a [employer], [employer].  He lives in [place].  He has step children.  Participants in the patient's visit included the patient, his wife, and Waymon Budge, Therapist, sports.  The intake visit was provided by Waymon Budge, RN.  Allergies:  Allergies  Allergen  Reactions   Zytiga [Abiraterone] Diarrhea    Current Medications: Current Outpatient Medications  Medication Sig Dispense Refill   abiraterone acetate (ZYTIGA) 250 MG tablet TAKE 4 TABLETS (1,000 MG TOTAL) BY MOUTH DAILY. TAKE ON AN EMPTY STOMACH 1 HOUR BEFORE OR 2 HOURS AFTER A MEAL 120 tablet 0   amLODipine (NORVASC) 10 MG tablet Take 1 tablet (10 mg total) by mouth daily. 90 tablet 3   buPROPion (WELLBUTRIN SR) 150 MG 12 hr tablet Take 1 tablet (150 mg total) by mouth 2 (two) times daily. 180 tablet 4   diphenoxylate-atropine (LOMOTIL) 2.5-0.025 MG tablet Take 1 tablet by mouth 4 (four) times daily as needed for diarrhea or loose stools. 120 tablet 3   HYDROcodone-acetaminophen (NORCO) 10-325 MG tablet Take 1 tablet by mouth every 3 (three) hours as needed. For cancer related pain Dx: C61 120 tablet 0   ondansetron (ZOFRAN) 8 MG tablet TAKE 1 TABLET BY MOUTH EVERY 8 HOURS AS NEEDED FOR NAUSEA 30 tablet 5   Oxycodone HCl 10 MG TABS  One tablet every six hours scheduled, and one every three hours as needed for breakthrough pain 120 tablet 0   predniSONE (DELTASONE) 5 MG tablet TAKE 1 TABLET BY MOUTH TWICE DAILY WITH MEALS 60 tablet 2   sodium polystyrene (KAYEXALATE) powder Take 15mg  twice a day (Patient not taking: Reported on 07/26/2018) 454 g 0   No current facility-administered medications for this visit.     Review of Systems:  GENERAL:  Energy level "not good" (chronic).  No fevers, sweats or weight loss. PERFORMANCE STATUS (ECOG):  2 HEENT:  No visual changes, runny nose, sore throat, mouth sores or tenderness. Lungs: Shortness of breath on exertion.  No cough.  No hemoptysis. Cardiac:  No chest pain, palpitations, orthopnea, or PND. GI:  Constipation managed well with Miralax.  No nausea, vomiting, diarrhea,melena or hematochezia. GU:  S/p nephrostomy tubes.  No urgency, frequency, dysuria, or hematuria. Musculoskeletal:  Chronic sacral pain.  Skull pain.  No joint pain.   No muscle tenderness. Extremities:  Left leg pain (chronic, increased).  No swelling. Skin:  No rashes or skin changes. Neuro:  No headache, numbness or weakness, balance or coordination issues. Endocrine:  No diabetes, thyroid issues, hot flashes.  Intermittent night sweats. Psych:  No mood changes, depression or anxiety. Pain:  Pain in left leg and top of head. Review of systems:  All other systems reviewed and found to be negative.   Appointment on 08/28/2018  Component Date Value Ref Range Status   Prostatic Specific Antigen 08/28/2018 180.00* 0.00 - 4.00 ng/mL Final   Comment: (NOTE) While PSA levels of <=4.0 ng/ml are reported as reference range, some men with levels below 4.0 ng/ml can have prostate cancer and many men with PSA above 4.0 ng/ml do not have prostate cancer.  Other tests such as free PSA, age specific reference ranges, PSA velocity and PSA doubling time may be helpful especially in men less than 29 years old. Performed at [facility], [place] [address], [place], [place] [address]    Sodium 08/28/2018 133* 135 - 145 mmol/L Final   Potassium 08/28/2018 4.7  3.5 - 5.1 mmol/L Final   Chloride 08/28/2018 100  98 - 111 mmol/L Final   CO2 08/28/2018 25  22 - 32 mmol/L Final   Glucose, Bld 08/28/2018 107* 70 - 99 mg/dL Final   BUN 08/28/2018 32* 8 - 23 mg/dL Final   Creatinine, Ser 08/28/2018 2.23* 0.61 - 1.24 mg/dL Final   Calcium 08/28/2018 9.1  8.9 - 10.3 mg/dL Final   Total Protein 08/28/2018 7.5  6.5 - 8.1 g/dL Final   Albumin 08/28/2018 4.0  3.5 - 5.0 g/dL Final   AST 08/28/2018 15  15 - 41 U/L Final   ALT 08/28/2018 11  0 - 44 U/L Final   Alkaline Phosphatase 08/28/2018 367* 38 - 126 U/L Final   Total Bilirubin 08/28/2018 0.4  0.3 - 1.2 mg/dL Final   GFR calc non Af Amer 08/28/2018 30* >60 mL/min Final   GFR calc Af Amer 08/28/2018 35* >60 mL/min Final   Anion gap 08/28/2018 8  5 - 15 Final   Performed at [facility],  [address] [place], [place] [address]   WBC 08/28/2018 8.3  4.0 - 10.5 K/uL Final   RBC 08/28/2018 4.10* 4.22 - 5.81 MIL/uL Final   Hemoglobin 08/28/2018 13.2  13.0 - 17.0 g/dL Final   HCT 08/28/2018 39.2  39.0 - 52.0 % Final   MCV 08/28/2018 95.6  80.0 - 100.0 fL Final   MCH 08/28/2018 32.2  26.0 - 34.0 pg Final   MCHC 08/28/2018 33.7  30.0 - 36.0 g/dL Final   RDW 08/28/2018 13.8  11.5 - 15.5 % Final   Platelets 08/28/2018 226  150 - 400 K/uL Final   nRBC 08/28/2018 0.0  0.0 - 0.2 % Final   Neutrophils Relative % 08/28/2018 67  % Final   Neutro Abs 08/28/2018 5.6  1.7 - 7.7 K/uL Final   Lymphocytes Relative 08/28/2018 25  % Final   Lymphs Abs 08/28/2018 2.1  0.7 - 4.0 K/uL Final   Monocytes Relative 08/28/2018 5  % Final   Monocytes Absolute 08/28/2018 0.5  0.1 - 1.0 K/uL Final   Eosinophils Relative 08/28/2018 2  % Final   Eosinophils Absolute 08/28/2018 0.2  0.0 - 0.5 K/uL Final   Basophils Relative 08/28/2018 1  % Final   Basophils Absolute 08/28/2018 0.1  0.0 - 0.1 K/uL Final   Immature Granulocytes 08/28/2018 0  % Final   Abs Immature Granulocytes 08/28/2018 0.02  0.00 - 0.07 K/uL Final   Performed at [facility], [address] [place], [place] [address]    Assessment:  Michael Small is a 63 y.o. male with metastatic prostate cancer s/p transrectal prostate biopsies and cystoscopy on 05/24/2017.  Prostate biopsies revealed acinar adenocarcinoma involving all 12 cores.  Adenocarcinoma involved colonic mucosa and skeletal muscle.  Gleason score was 9 (4+5), grade group 5.  Pathologic stage was T4N1M1c.  PSA was 2338.8 on 04/24/2017.  Abdomen and pelvic CT on 04/27/2017 revealed prominent pelvic adenopathy with pathologic retroperitoneal adenopathy, abnormal retroperitoneal stranding, and abnormal inflammatory type stranding along the pelvic sidewalls and perirectal space.  A representative right external iliac lymph node measured 3.2 cm.  A  left external iliac lymph node measured 2.1 cm.  There was conglomerate left periaortic adenopathy 2.1 cm and a 1.4 cm aortocaval node.   There was abnormal adenopathy in the perirectal space with prominent wall thickening in the rectum and moderate wall thickening in the sigmoid colon. The findings along the pelvic sidewalls appeared to be causing distal ureteral obstruction resulting in moderate bilateral hydronephrosis and hydroureter.  Lymphoma or prostate cancer was favored. A right external iliac node measured 3.2 cm.  There was bladder wall thickening suggesting cystitis.  There was prostatomegaly.  Bone scan on 06/22/2017 revealed findings consistent with diffuse metastatic disease.  There was activity in right temporal area, sternum, anterior and posterior ribs bilaterally, and entire spine.  Non-contrast chest CT on 06/27/2017 revealed right infrahilar prominence, progressed from prior CT, worrisome for a node/nodule, but poorly visualized given the lack of IV contrast.  There was mid thoracic lymphadenopathy, progressed, including a dominant 14 mm short axis node at the left thoracic inlet.  There was stable 19 x 9 mm irregular nodule at the right lung apex.  There was a sclerotic osseous metastases at T8-9.  Bone scan on 03/09/2018 revealed widespread osseous metastatic disease with super scan appearance, progressive since prior exam.  Abdomen and pelvis CT on 03/14/2018 revealed interval progression of widespread sclerotic bony metastatic involvement with new areas of lucency in the axial skeleton and bony pelvis.  There was evidence of interval renal atrophy, right greater than left. There was mild persistent right hydronephrosis.  There was interval removal of percutaneous nephrostomy catheters with internal ureteral stents visualized bilaterally in situ.  There was mild lymphadenopathy in the pelvic sidewall bilaterally.  There was diffuse  body wall, retroperitoneal, and mesenteric edema.   There was persistent circumferential wall thickening in the distal rectum.  He received Lupron 22.5 mg every 3 months (06/01/2017; 10/02/2017; last 06/27/2018) and Degarelix 240 mg on 06/15/2017.  He received Casodex from 06/13/2017 - 06/15/2017.    He began abiraterone on 02/28/2018 and discontinued after 2 days secondary to nausea, vomiting, diarrhea, and fatigue.  He restarted abiraterone (1 pill/day) on 03/16/2018 and increased to 2 pills/day on 03/23/2018.  He increased to 4 tablets in mid 03/2018.  Testosterone was 98 on 06/14/2017, < 3 on 09/07/2017, and < 3 on 03/16/2018.  PSA has been followed:  2338.8 on 04/24/2017, 3603.0 on 06/23/2017, 1035 on 07/26/2017, 241 on 09/07/2017, 342 on 01/23/2018, 799 on 04-14-202019, 107.14 on 04/24/2018, 116.03 on 05/22/2018, 116.52 on 06/27/2018, 101.37 on 07/26/2018, and 180.0 on 08/28/2018.  He was admitted to [facility] from 06/12/2017 - 06/15/2017 with hyperkalemia and acute renal failure.  Creatinine was 7.23.  He underwent emergent dialysis x1. He is s/p bilateral external nephrostomy tube placement on 06/14/2017.  He was admitted to [facility] from 07/28/2017 - 07/31/2017 with sepsis secondary to UTI with MSSA bacteremia.    Bilateral lower extremity duplex on 06/12/2017 revealed no evidence of DVT.   Code status is DNR/DNI.  Symptomatically, he is having more pain.  PSA has increased.  Plan: 1.   Review labs from 08/28/2018. 2. Metastatic prostate cancer             Patient has increasing pain this month (new).  Discuss correlation with rising PSA.  Patient continues abiraterone and prednisone.  Continue  Lupron every 3 months (due 09/26/2018). Discuss upcoming restaging studies:  Bone scan on 09/11/2018.   Abdomen and pelvic CT on 09/11/2018. Discuss treatment options.    Discuss Taxotere, cabazitaxel (used after Taxotere failure), and Xtandi (enzalutamide).  Potential side effects reviewed.  Patient states that he is not interested in  chemotherapy.  Discuss sending patient information on medications. 3. Renal insufficiency Creatinine 2.23 (improved) 4. Cancer-related pain       Discuss keeping a pain dairy.  Discuss using oxycodone every 3-4 hours to improve pain.  Discuss plan to start long acting pain medication (Fentanyl patch) depending on pain medication usage.  Discuss importance of preventing constipation. 5.   RTC after imaging (CT and bone scan scheduled for 09/11/2018) for MD assessment. 6.   RTC on 09/26/2018 for Lupron.   I discussed the assessment and treatment plan with the patient.  The patient was provided an opportunity to ask questions and all were answered.  The patient agreed with the plan and demonstrated an understanding of the instructions.  The patient was advised to call back or seek an in person evaluation if the symptoms worsen or if the condition fails to improve as anticipated.  I provided 21 minutes (12:25 PM - 12:46 PM) of non-face-to-face time during this encounter.  I provided these services from the [facility] office.   Lequita Asal, MD, PhD  08/29/2018, 4:35 PM

## 2018-08-29 NOTE — Progress Notes (Signed)
Denies any concerns at this time. Verified Name, DOB, and Address.

## 2018-09-03 ENCOUNTER — Encounter: Payer: Self-pay | Admitting: Hematology and Oncology

## 2018-09-10 ENCOUNTER — Other Ambulatory Visit: Payer: Self-pay | Admitting: Hematology and Oncology

## 2018-09-10 ENCOUNTER — Other Ambulatory Visit: Payer: Self-pay | Admitting: *Deleted

## 2018-09-10 DIAGNOSIS — C61 Malignant neoplasm of prostate: Secondary | ICD-10-CM

## 2018-09-10 DIAGNOSIS — G893 Neoplasm related pain (acute) (chronic): Secondary | ICD-10-CM

## 2018-09-10 MED ORDER — HYDROCODONE-ACETAMINOPHEN 10-325 MG PO TABS: 1.0000 | ORAL_TABLET | ORAL | 0 refills | Status: DC | PRN

## 2018-09-10 MED ORDER — OXYCODONE HCL 10 MG PO TABS: ORAL_TABLET | ORAL | 0 refills | Status: DC

## 2018-09-10 MED FILL — predniSONE 5 MG TABS: 5 | 30 days supply | Qty: 60 | Fill #0

## 2018-09-10 NOTE — Telephone Encounter (Signed)
CBC with Differential/Platelet  Order: [id number]  Status:  Final result  Visible to patient:  Yes (MyChart)  Next appt:  09/11/2018 at 10:00 AM in Radiology ([facility])  Dx:  Prostate cancer ([place]); Metastasis to ...   Ref Range & Units 13d ago (08/28/18) 66mo ago (07/26/18) 41mo ago (06/27/18) 77mo ago (05/22/18) 47mo ago (05/08/18) 45mo ago (04/24/18) 35mo ago (04/10/18)  WBC 4.0 - 10.5 K/uL 8.3  8.5  6.5  6.4  6.0  6.8  5.8   RBC 4.22 - 5.81 MIL/uL 4.10Low   4.06Low   3.94Low   3.90Low   3.88Low   4.02Low   3.87Low    Hemoglobin 13.0 - 17.0 g/dL 13.2  13.2  12.6Low   11.9Low   12.0Low   12.1Low   11.9Low    HCT 39.0 - 52.0 % 39.2  39.2  38.2Low   37.2Low   37.2Low   38.1Low   36.7Low    MCV 80.0 - 100.0 fL 95.6  96.6  97.0  95.4  95.9  94.8  94.8   MCH 26.0 - 34.0 pg 32.2  32.5  32.0  30.5  30.9  30.1  30.7   MCHC 30.0 - 36.0 g/dL 33.7  33.7  33.0  32.0  32.3  31.8  32.4   RDW 11.5 - 15.5 % 13.8  13.5  13.9  14.8  15.5  16.4High   17.2High    Platelets 150 - 400 K/uL 226  227  228  231  233  200  218   nRBC 0.0 - 0.2 % 0.0  0.0  0.0  0.0  0.0  0.0  0.0   Neutrophils Relative % % 67  64  61  58  65  70  65   Neutro Abs 1.7 - 7.7 K/uL 5.6  5.5  4.0  3.7  4.0  4.8  3.8   Lymphocytes Relative % 25  25  27  30  24  19  27    Lymphs Abs 0.7 - 4.0 K/uL 2.1  2.1  1.8  1.9  1.5  1.3  1.5   Monocytes Relative % 5  7  7  7  7  8  5    Monocytes Absolute 0.1 - 1.0 K/uL 0.5  0.6  0.4  0.5  0.4  0.5  0.3   Eosinophils Relative % 2  3  4  4  3  2  2    Eosinophils Absolute 0.0 - 0.5 K/uL 0.2  0.2  0.2  0.2  0.2  0.1  0.1   Basophils Relative % 1  1  1  1  1  1  1    Basophils Absolute 0.0 - 0.1 K/uL 0.1  0.1  0.1  0.1  0.0  0.0  0.0   Immature Granulocytes % 0  0  0  0  0  0  0   Abs Immature Granulocytes 0.00 - 0.07 K/uL 0.02  0.03 CM 0.01 CM 0.01 CM 0.02 CM 0.02 CM 0.01 CM  Comment: Performed at [facility], [address], [place], [place] [address]  Resulting Agency  [facility] [place]  CLIN LAB [place] CL[facility] [place] [facility] [place] [facility] [place] [facility] [place] [facility]      Specimen Collected: 08/28/18 10:36  Last Resulted: 08/28/18 10:48     Lab Flowsheet    Order Details    View Encounter    Lab and Collection Details  Routing    Result History      CM=Additional comments      Other Results from 08/28/2018   Contains abnormal data PSA  Order: [id number]   Status:  Final result  Visible to patient:  Yes (MyChart)  Next appt:  09/11/2018 at 10:00 AM in Radiology ([facility])  Dx:  Prostate cancer ([place]); Metastasis to ...   Ref Range & Units 13d ago 76mo ago 63mo ago 20mo ago 44mo ago 32mo ago 75yr ago  Prostatic Specific Antigen 0.00 - 4.00 ng/mL 180.00High   101.37High  CM 116.52High  CM 116.03High  CM 107.14High  CM 799.00High  CM 1,035.00High  CM  Comment: (NOTE)  While PSA levels of <=4.0 ng/ml are reported as reference range, some  men with levels below 4.0 ng/ml can have prostate cancer and many men  with PSA above 4.0 ng/ml do not have prostate cancer. Other tests  such as free PSA, age specific reference ranges, PSA velocity and PSA  doubling time may be helpful especially in men less than 3 years  old.  Performed at [facility], [place] [address] [place], [place]  [address]   Resulting Agency  [facility] [place] [facility] [place] [facility] [place] [facility] [place] [facility] [place] [facility] [place] [facility]      Specimen Collected: 08/28/18 10:36  Last Resulted: 08/28/18 20:37     Lab Flowsheet    Order Details    View Encounter    Lab and Collection Details    Routing    Result History      CM=Additional comments        Contains abnormal data Comprehensive metabolic panel  Order: [id number]   Status:  Final result  Visible to patient:  Yes (MyChart)  Next appt:  09/11/2018 at 10:00 AM in Radiology ([facility])  Dx:  Prostate cancer ([place]); Metastasis to ...   Ref Range & Units 13d ago (08/28/18) 90mo ago (07/26/18) 55mo ago (07/11/18) 64mo ago (06/27/18)  1mo ago (06/19/18) 6mo ago (06/05/18) 14mo ago (05/22/18)  Sodium 135 - 145 mmol/L 133Low   139   138    140   Potassium 3.5 - 5.1 mmol/L 4.7  4.7   4.8    4.5   Chloride 98 - 111 mmol/L 100  106   108    106   CO2 22 - 32 mmol/L 25  22   23    25    Glucose, Bld 70 - 99 mg/dL 107High   96   109High     109High    BUN 8 - 23 mg/dL 32High   48High    35High     37High    Creatinine, Ser 0.61 - 1.24 mg/dL 2.23High   2.50High    2.42High     2.15High    Calcium 8.9 - 10.3 mg/dL 9.1  9.1   8.8Low     9.5   Total Protein 6.5 - 8.1 g/dL 7.5  7.4  6.8  7.1  7.3  6.8  7.0   Albumin 3.5 - 5.0 g/dL 4.0  4.2  3.8  3.9  4.2  4.1  4.1   AST 15 - 41 U/L 15  15  12Low   15  14Low   15  16   ALT 0 - 44 U/L 11  9  10  11  11  12  11    Alkaline Phosphatase 38 - 126 U/L  367High   326High   327High   313High   365High   325High   292High    Total Bilirubin 0.3 - 1.2 mg/dL 0.4  0.5  0.5  0.3  0.6  0.7  0.6   GFR calc non Af Amer >60 mL/min 30Low   26Low    27Low     32Low    GFR calc Af Amer >60 mL/min 35Low   31Low    32Low     37Low    Anion gap 5 - 15 8  11  CM  7 CM   9 CM  Comment: Performed at [facility], [address], [place], [place] [address]  Resulting Agency  [facility] CL[facility] [place] [facility] [place] [facility] [place] [facility] [place] [facility] [place] [facility] [place] [facility]      Specimen Collected: 08/28/18 10:36  Last Resulted: 08/28/18 10:59

## 2018-09-11 ENCOUNTER — Other Ambulatory Visit: Payer: Self-pay | Admitting: *Deleted

## 2018-09-11 ENCOUNTER — Ambulatory Visit
Admission: RE | Admit: 2018-09-11 | Discharge: 2018-09-11 | Disposition: A | Discharge status: Home / Self Care | Payer: Medicare Other | Source: Ambulatory Visit | Attending: Urgent Care | Admitting: Urgent Care

## 2018-09-11 ENCOUNTER — Ambulatory Visit: Admission: RE | Admit: 2018-09-11 | Payer: Medicare Other | Source: Ambulatory Visit

## 2018-09-11 ENCOUNTER — Encounter
Admission: RE | Admit: 2018-09-11 | Discharge: 2018-09-11 | Disposition: A | Discharge status: Home / Self Care | Payer: Medicare Other | Source: Ambulatory Visit | Attending: Urgent Care | Admitting: Urgent Care

## 2018-09-11 ENCOUNTER — Other Ambulatory Visit: Payer: Self-pay

## 2018-09-11 DIAGNOSIS — C61 Malignant neoplasm of prostate: Secondary | ICD-10-CM

## 2018-09-11 DIAGNOSIS — C7951 Secondary malignant neoplasm of bone: Secondary | ICD-10-CM | POA: Diagnosis not present

## 2018-09-11 MED ORDER — TECHNETIUM TC 99M MEDRONATE IV KIT
23.7000 | PACK | Freq: Once | INTRAVENOUS | Status: AC | PRN
  Administered 2018-09-11: 10:00:00 23.7 via INTRAVENOUS

## 2018-09-11 NOTE — Telephone Encounter (Signed)
CBC with Differential/Platelet  Order: [id number]  Status:  Final result  Visible to patient:  Yes (MyChart)  Next appt:  Today at 10:00 AM in Radiology ([facility])  Dx:  Prostate cancer ([place]); Metastasis to ...   Ref Range & Units 2wk ago (08/28/18) 58mo ago (07/26/18) 24mo ago (06/27/18) 74mo ago (05/22/18) 1mo ago (05/08/18) 106mo ago (04/24/18) 60mo ago (04/10/18)  WBC 4.0 - 10.5 K/uL 8.3  8.5  6.5  6.4  6.0  6.8  5.8   RBC 4.22 - 5.81 MIL/uL 4.10Low   4.06Low   3.94Low   3.90Low   3.88Low   4.02Low   3.87Low    Hemoglobin 13.0 - 17.0 g/dL 13.2  13.2  12.6Low   11.9Low   12.0Low   12.1Low   11.9Low    HCT 39.0 - 52.0 % 39.2  39.2  38.2Low   37.2Low   37.2Low   38.1Low   36.7Low    MCV 80.0 - 100.0 fL 95.6  96.6  97.0  95.4  95.9  94.8  94.8   MCH 26.0 - 34.0 pg 32.2  32.5  32.0  30.5  30.9  30.1  30.7   MCHC 30.0 - 36.0 g/dL 33.7  33.7  33.0  32.0  32.3  31.8  32.4   RDW 11.5 - 15.5 % 13.8  13.5  13.9  14.8  15.5  16.4High   17.2High    Platelets 150 - 400 K/uL 226  227  228  231  233  200  218   nRBC 0.0 - 0.2 % 0.0  0.0  0.0  0.0  0.0  0.0  0.0   Neutrophils Relative % % 67  64  61  58  65  70  65   Neutro Abs 1.7 - 7.7 K/uL 5.6  5.5  4.0  3.7  4.0  4.8  3.8   Lymphocytes Relative % 25  25  27  30  24  19  27    Lymphs Abs 0.7 - 4.0 K/uL 2.1  2.1  1.8  1.9  1.5  1.3  1.5   Monocytes Relative % 5  7  7  7  7  8  5    Monocytes Absolute 0.1 - 1.0 K/uL 0.5  0.6  0.4  0.5  0.4  0.5  0.3   Eosinophils Relative % 2  3  4  4  3  2  2    Eosinophils Absolute 0.0 - 0.5 K/uL 0.2  0.2  0.2  0.2  0.2  0.1  0.1   Basophils Relative % 1  1  1  1  1  1  1    Basophils Absolute 0.0 - 0.1 K/uL 0.1  0.1  0.1  0.1  0.0  0.0  0.0   Immature Granulocytes % 0  0  0  0  0  0  0   Abs Immature Granulocytes 0.00 - 0.07 K/uL 0.02  0.03 CM 0.01 CM 0.01 CM 0.02 CM 0.02 CM 0.01 CM  Comment: Performed at [facility], [address], [place], [place] [address]  Resulting Agency  [facility] [place] [facility]  LAB [place] [facility] [place] [facility] [place] [facility] [place] [facility] [place] [facility]      Specimen Collected: 08/28/18 10:36  Last Resulted: 08/28/18 10:48     Lab Flowsheet    Order Details    View Encounter    Lab and Collection Details  Routing    Result History      CM=Additional comments      Other Results from 08/28/2018   Contains abnormal data PSA  Order: [id number]   Status:  Final result  Visible to patient:  Yes (MyChart)  Next appt:  Today at 10:00 AM in Radiology ([facility])  Dx:  Prostate cancer ([place]); Metastasis to ...   Ref Range & Units 2wk ago 70mo ago 62mo ago 43mo ago 19mo ago 72mo ago 61yr ago  Prostatic Specific Antigen 0.00 - 4.00 ng/mL 180.00High   101.37High  CM 116.52High  CM 116.03High  CM 107.14High  CM 799.00High  CM 1,035.00High  CM  Comment: (NOTE)  While PSA levels of <=4.0 ng/ml are reported as reference range, some  men with levels below 4.0 ng/ml can have prostate cancer and many men  with PSA above 4.0 ng/ml do not have prostate cancer. Other tests  such as free PSA, age specific reference ranges, PSA velocity and PSA  doubling time may be helpful especially in men less than 13 years  old.  Performed at [facility], [place] [address] [place], [place]  [address]   Resulting Agency  [place] [facility] [place] [facility] [place] [facility] [place] [facility] [place] [facility] [place] [facility]      Specimen Collected: 08/28/18 10:36  Last Resulted: 08/28/18 20:37     Lab Flowsheet    Order Details    View Encounter    Lab and Collection Details    Routing    Result History      CM=Additional comments        Contains abnormal data Comprehensive metabolic panel  Order: [id number]   Status:  Final result  Visible to patient:  Yes (MyChart)  Next appt:  Today at 10:00 AM in Radiology ([facility])  Dx:  Prostate cancer ([place]); Metastasis to ...   Ref Range & Units 2wk ago (08/28/18) 44mo ago (07/26/18) 19mo ago (07/11/18) 74mo ago (06/27/18) 39mo  ago (06/19/18) 4mo ago (06/05/18) 47mo ago (05/22/18)  Sodium 135 - 145 mmol/L 133Low   139   138    140   Potassium 3.5 - 5.1 mmol/L 4.7  4.7   4.8    4.5   Chloride 98 - 111 mmol/L 100  106   108    106   CO2 22 - 32 mmol/L 25  22   23    25    Glucose, Bld 70 - 99 mg/dL 107High   96   109High     109High    BUN 8 - 23 mg/dL 32High   48High    35High     37High    Creatinine, Ser 0.61 - 1.24 mg/dL 2.23High   2.50High    2.42High     2.15High    Calcium 8.9 - 10.3 mg/dL 9.1  9.1   8.8Low     9.5   Total Protein 6.5 - 8.1 g/dL 7.5  7.4  6.8  7.1  7.3  6.8  7.0   Albumin 3.5 - 5.0 g/dL 4.0  4.2  3.8  3.9  4.2  4.1  4.1   AST 15 - 41 U/L 15  15  12Low   15  14Low   15  16   ALT 0 - 44 U/L 11  9  10  11  11  12  11    Alkaline Phosphatase 38 - 126 U/L  367High   326High   327High   313High   365High   325High   292High    Total Bilirubin 0.3 - 1.2 mg/dL 0.4  0.5  0.5  0.3  0.6  0.7  0.6   GFR calc non Af Amer >60 mL/min 30Low   26Low    27Low     32Low    GFR calc Af Amer >60 mL/min 35Low   31Low    32Low     37Low    Anion gap 5 - 15 8  11  CM  7 CM   9 CM  Comment: Performed at [facility], [address], [place], [place] [address]  Resulting Agency  [facility] CL[facility] LAB [place] [facility] [place] [facility] [place] [facility] [place] [facility] [place] [facility] [place] [facility]      Specimen Collected: 08/28/18 10:36  Last Resulted: 08/28/18 10:59

## 2018-09-12 ENCOUNTER — Other Ambulatory Visit: Payer: Self-pay

## 2018-09-13 ENCOUNTER — Other Ambulatory Visit: Payer: Self-pay | Admitting: Hematology and Oncology

## 2018-09-13 ENCOUNTER — Ambulatory Visit: Payer: Medicare Other | Admitting: Hematology and Oncology

## 2018-09-13 ENCOUNTER — Inpatient Hospital Stay: Payer: Medicare Other | Attending: Hematology and Oncology | Admitting: Hematology and Oncology

## 2018-09-13 ENCOUNTER — Encounter: Payer: Self-pay | Admitting: Hematology and Oncology

## 2018-09-13 DIAGNOSIS — C7951 Secondary malignant neoplasm of bone: Secondary | ICD-10-CM

## 2018-09-13 DIAGNOSIS — C61 Malignant neoplasm of prostate: Secondary | ICD-10-CM | POA: Diagnosis not present

## 2018-09-13 DIAGNOSIS — Z7189 Other specified counseling: Secondary | ICD-10-CM | POA: Diagnosis not present

## 2018-09-13 DIAGNOSIS — G893 Neoplasm related pain (acute) (chronic): Secondary | ICD-10-CM

## 2018-09-13 MED ORDER — FENTANYL 12 MCG/HR TD PT72: 1.0000 | MEDICATED_PATCH | TRANSDERMAL | 0 refills | Status: DC

## 2018-09-13 MED ORDER — ENZALUTAMIDE 40 MG PO CAPS: 160.0000 mg | ORAL_CAPSULE | Freq: Every day | ORAL | 0 refills | Status: DC

## 2018-09-13 NOTE — Progress Notes (Signed)
[facility]  [address] [place], [place] [address] Phone: [phone]  Fax: [phone]   Telephone Office Visit:  09/13/2018   Referring physician:  Birdie Sons, MD   I connected with Michael Small on 09/13/18 at 2:34 PM  EDT by telephone and verified that I was speaking with the correct person using 2 identifiers.  The patient was at home.  I discussed the limitations, risk, security and privacy concerns of performing an evaluation and management service by telephone and the availability of in person appointments.  I also discussed with the patient that there may be a patient responsible charge related to this service.  The patient expressed understanding and agreed to proceed.   Chief Complaint: Michael Small is a 63 y.o. 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   HPI:  The patient was last evaluated on 08/29/2018.  At that time, he was having more bone pain.  PSA had increased from 101.37 to 180.0 in 1 month. He was taking oxycodone every 3-4 hours.  We discussed keeping a pain diary and adding a Fentanyl patch based on his needs.  Restaging studies were ordered.   Abdomen and pelvic CT on 09/11/2018 revealed grossly stable widespread osseous metastatic disease. There was no new pathologic fracture.  There was small retroperitoneal and pelvic lymph nodes, stable.  Both ureteral stents appeared adequately positioned. There was stable right-sided hydronephrosis and hydroureter with chronic renal cortical thinning.  There was no evidence of urinary tract calculus.  There was interval decreased generalized soft tissue and perirectal edema.  There were no acute findings.  Bone scan on 09/11/2018 revealed worsening bony metastatic disease in the right humerus, left humerus, right side of the calvarium, the left side of the manubrium skin, and  in the bilateral femurs.  There was persistent metastatic disease in the pelvic bones, sternum, and spine.  There was subtle persistent metastases in the posterior ribs bilaterally. The metastasis in a medial left posterior rib was more prominent The lack of soft tissue uptake was consistent with a super scan due to the number metastases.  During the interim, he has been taking 4-6 oxycodone/day.  At times, he will take hydrocodone with Tylenol 10/325 in between if needed.  He is also taking ibuprofen.   Past Medical History:  Diagnosis Date   Chronic kidney disease    Depression    GERD (gastroesophageal reflux disease)    Hyperlipidemia    Hypertension    Myocardial infarction [facility])    s/p stent   Neuromuscular disorder (HCC)    numbness in arms and legs   Prostate cancer [facility])     Past Surgical History:  Procedure Laterality Date   CORONARY ANGIOPLASTY WITH STENT PLACEMENT  12/2011   DES LAD [facility]   CYSTOSCOPY W/ RETROGRADES Bilateral 05/24/2017   Procedure: CYSTOSCOPY WITH RETROGRADE PYELOGRAM;  Surgeon: [name], MD;  Location: [facility];  Service: Urology;  Laterality: Bilateral;   CYSTOSCOPY W/ RETROGRADES Bilateral 11/21/2017   Procedure: CYSTOSCOPY WITH RETROGRADE PYELOGRAM;  Surgeon: [name], MD;  Location: [facility];  Service: Urology;  Laterality: Bilateral;   CYSTOSCOPY W/ RETROGRADES Bilateral 03/20/2018   Procedure: CYSTOSCOPY WITH RETROGRADE PYELOGRAM;  Surgeon: [name], MD;  Location: [facility];  Service: Urology;  Laterality: Bilateral;   CYSTOSCOPY W/ URETERAL STENT PLACEMENT Bilateral 11/21/2017   Procedure:  CYSTOSCOPY WITH STENT REPLACEMENT;  Surgeon: [name], MD;  Location: [facility];  Service: Urology;  Laterality: Bilateral;   CYSTOSCOPY W/ URETERAL STENT REMOVAL Bilateral 11/21/2017   Procedure: CYSTOSCOPY WITH STENT REMOVAL;  Surgeon: [name], MD;  Location: [facility];  Service: Urology;  Laterality:  Bilateral;   CYSTOSCOPY WITH STENT PLACEMENT Right 05/24/2017   Procedure: CYSTOSCOPY WITH STENT PLACEMENT;  Surgeon: [name], MD;  Location: [facility];  Service: Urology;  Laterality: Right;   CYSTOSCOPY WITH STENT PLACEMENT Bilateral 03/20/2018   Procedure: CYSTOSCOPY WITH STENT EXCHANGE;  Surgeon: [name], MD;  Location: [facility];  Service: Urology;  Laterality: Bilateral;   IR NEPHROSTOGRAM LEFT THRU EXISTING ACCESS  07/21/2017   IR NEPHROSTOGRAM LEFT THRU EXISTING ACCESS  08/18/2017   IR NEPHROSTOGRAM RIGHT THRU EXISTING ACCESS  08/18/2017   IR NEPHROSTOMY PLACEMENT LEFT  06/15/2017   IR NEPHROSTOMY PLACEMENT RIGHT  06/14/2017   IR URETERAL STENT PLACEMENT EXISTING ACCESS LEFT  07/21/2017   PICC LINE INSERTION N/A 08/02/2017   Procedure: PICC LINE INSERTION;  Surgeon: Katha Cabal, MD;  Location: [facility];  Service: Cardiovascular;  Laterality: N/A;   PROSTATE BIOPSY N/A 05/24/2017   Procedure: PROSTATE BIOPSY;  Surgeon: [name], MD;  Location: [facility];  Service: Urology;  Laterality: N/A;   TEE WITHOUT CARDIOVERSION N/A 07/31/2017   Procedure: TRANSESOPHAGEAL ECHOCARDIOGRAM (TEE);  Surgeon: Teodoro Spray, MD;  Location: [facility];  Service: Cardiovascular;  Laterality: N/A;   US ECHOCARDIOGRAPHY  12/2011   Mild LV dysfunction, EF=45%    Family History  Problem Relation Age of Onset   Cancer Mother    Hypertension Mother    Diabetes Father    Heart disease Father    Cancer Paternal Grandfather    Prostate cancer Neg Hx    Bladder Cancer Neg Hx    Kidney cancer Neg Hx     Social History:  reports that he has been smoking cigars. He has a 24.00 pack-year smoking history. He has never used smokeless tobacco. He reports that he does not drink alcohol or use drugs.  He has smoked 1 pack per day since age 63 (20 years).  He smokes small cigars.  He does not drink alcohol, but previously drank heavily.  He describes being exposed  to Round Up and asbestos.  He has been disabled for several years secondary to "nerve damage and problems in his legs, arms, and bad back".  He fell off a ladder.  He previously worked for a [employer], [employer].  He lives in [place].  He has step children.  Participants in the patient's visit included the patient, his wife Parke Simmers), and Vito Berger, [place].  The intake visit was provided by Vito Berger, CMA.  Allergies:  Allergies  Allergen Reactions   Zytiga [Abiraterone] Diarrhea    Current Medications: Current Outpatient Medications  Medication Sig Dispense Refill   abiraterone acetate (ZYTIGA) 250 MG tablet TAKE 4 TABLETS (1,000 MG TOTAL) BY MOUTH DAILY. TAKE ON AN EMPTY STOMACH 1 HOUR BEFORE OR 2 HOURS AFTER A MEAL 120 tablet 0   amLODipine (NORVASC) 10 MG tablet Take 1 tablet (10 mg total) by mouth daily. 90 tablet 3   buPROPion (WELLBUTRIN SR) 150 MG 12 hr tablet Take 1 tablet (150 mg total) by mouth 2 (two) times daily. 180 tablet 4   HYDROcodone-acetaminophen (NORCO) 10-325 MG tablet Take 1 tablet by mouth every 3 (three) hours as needed. For cancer  related pain Dx: C61 120 tablet 0   Oxycodone HCl 10 MG TABS One tablet every six hours scheduled, and one every three hours as needed for breakthrough pain 120 tablet 0   predniSONE (DELTASONE) 5 MG tablet TAKE 1 TABLET BY MOUTH TWICE DAILY WITH MEALS 60 tablet 2   diphenoxylate-atropine (LOMOTIL) 2.5-0.025 MG tablet Take 1 tablet by mouth 4 (four) times daily as needed for diarrhea or loose stools. (Patient not taking: Reported on 09/13/2018) 120 tablet 3   ondansetron (ZOFRAN) 8 MG tablet TAKE 1 TABLET BY MOUTH EVERY 8 HOURS AS NEEDED FOR NAUSEA (Patient not taking: Reported on 09/13/2018) 30 tablet 5   No current facility-administered medications for this visit.     Review of Systems:  GENERAL:  Feels "the same".  No fevers, sweats or weight loss. PERFORMANCE STATUS (ECOG):  2 HEENT:  No visual changes, runny  nose, sore throat, mouth sores or tenderness. Lungs: Shortness of breath with exertion.  No cough.  No hemoptysis. Cardiac:  Soreness in chest when mowing yard. h/o angioplasty and stent.  No palpitations, orthopnea, or PND.  Sees Dr. Erin Fulling (last 2 years ago). GI:  No nausea, vomiting, diarrhea, constipation, melena or hematochezia. GU:  s/p nephrostomy tubes.  No urgency, frequency, dysuria, or hematuria. Musculoskeletal:  Leg, sacrum, and skull pain.  No muscle tenderness. Extremities:  Leg pain.  No swelling. Skin:  No rashes or skin changes. Neuro:  No headache, numbness or weakness, balance or coordination issues. Endocrine:  No diabetes, thyroid issues, hot flashes or night sweats. Psych:  No mood changes, depression or anxiety. Pain: Whole body pain. Review of systems:  All other systems reviewed and found to be negative.    Imaging studies:  04/27/2017:  Abdomen and pelvic CT revealed prominent pelvic adenopathy with pathologic retroperitoneal adenopathy, abnormal retroperitoneal stranding, and abnormal inflammatory type stranding along the pelvic sidewalls and perirectal space.  A representative right external iliac lymph node measured 3.2 cm.  A left external iliac lymph node measured 2.1 cm.  There was conglomerate left periaortic adenopathy 2.1 cm and a 1.4 cm aortocaval node.   There was abnormal adenopathy in the perirectal space with prominent wall thickening in the rectum and moderate wall thickening in the sigmoid colon. The findings along the pelvic sidewalls appeared to be causing distal ureteral obstruction resulting in moderate bilateral hydronephrosis and hydroureter.  Lymphoma or prostate cancer was favored. A right external iliac node measured 3.2 cm.  There was bladder wall thickening suggesting cystitis.  There was prostatomegaly. 06/22/2017:  Bone scan revealed findings consistent with diffuse metastatic disease.  There was activity in right temporal area, sternum,  anterior and posterior ribs bilaterally, and entire spine. 06/27/2017:  Chest CT revealed right infrahilar prominence, progressed from prior CT, worrisome for a node/nodule, but poorly visualized given the lack of IV contrast.  There was mid thoracic lymphadenopathy, progressed, including a dominant 14 mm short axis node at the left thoracic inlet.  There was stable 19 x 9 mm irregular nodule at the right lung apex.  There was a sclerotic osseous metastases at T8-9. 03/09/2018:  Bone scan revealed widespread osseous metastatic disease with super scan appearance, progressive since prior exam. 03/14/2018:  Abdomen and pelvic CT revealed interval progression of widespread sclerotic bony metastatic involvement with new areas of lucency in the axial skeleton and bony pelvis.  There was evidence of interval renal atrophy, right greater than left. There was mild persistent right hydronephrosis.  There was interval removal  of percutaneous nephrostomy catheters with internal ureteral stents visualized bilaterally in situ.  There was mild lymphadenopathy in the pelvic sidewall bilaterally.  There was diffuse body wall, retroperitoneal, and mesenteric edema.  There was persistent circumferential wall thickening in the distal rectum. 06/29/2018:  Low dose chest CT revealed Lung-RADS 2, benign appearance or behavior. There was stable nodular opacity in the right upper lobe which likely reflected pleural parenchymal scarring.  There was improved thoracic lymphadenopathy.  There was multifocal/diffuse osseous metastases throughout the visualized axial and appendicular skeleton, progressed. 09/11/2018:  Abdomen and pelvic CT revealed grossly stable widespread osseous metastatic disease. There was no new pathologic fracture.  There was small retroperitoneal and pelvic lymph nodes, stable.  Both ureteral stents appeared adequately positioned. There was stable right-sided hydronephrosis and hydroureter with chronic renal cortical  thinning.  There was no evidence of urinary tract calculus.  There was interval decreased generalized soft tissue and perirectal edema.  There were no acute findings. 09/11/2018:Bone scan revealed worsening bony metastatic disease in the right humerus, left humerus, right side of the calvarium, the left side of the manubrium skin, and in the bilateral femurs.  There was persistent metastatic disease in the pelvic bones, sternum, and spine.  There was subtle persistent metastases in the posterior ribs bilaterally. The metastasis in a medial left posterior rib was more prominent.  The lack of soft tissue uptake was consistent with a super scan due to the number metastases.   No visits with results within 3 Day(s) from this visit.  Latest known visit with results is:  Appointment on 08/28/2018  Component Date Value Ref Range Status   Prostatic Specific Antigen 08/28/2018 180.00* 0.00 - 4.00 ng/mL Final   Comment: (NOTE) While PSA levels of <=4.0 ng/ml are reported as reference range, some men with levels below 4.0 ng/ml can have prostate cancer and many men with PSA above 4.0 ng/ml do not have prostate cancer.  Other tests such as free PSA, age specific reference ranges, PSA velocity and PSA doubling time may be helpful especially in men less than 57 years old. Performed at [facility], [place] [address], [place], [place] [address]    Sodium 08/28/2018 133* 135 - 145 mmol/L Final   Potassium 08/28/2018 4.7  3.5 - 5.1 mmol/L Final   Chloride 08/28/2018 100  98 - 111 mmol/L Final   CO2 08/28/2018 25  22 - 32 mmol/L Final   Glucose, Bld 08/28/2018 107* 70 - 99 mg/dL Final   BUN 08/28/2018 32* 8 - 23 mg/dL Final   Creatinine, Ser 08/28/2018 2.23* 0.61 - 1.24 mg/dL Final   Calcium 08/28/2018 9.1  8.9 - 10.3 mg/dL Final   Total Protein 08/28/2018 7.5  6.5 - 8.1 g/dL Final   Albumin 08/28/2018 4.0  3.5 - 5.0 g/dL Final   AST 08/28/2018 15  15 - 41 U/L Final   ALT 08/28/2018 11   0 - 44 U/L Final   Alkaline Phosphatase 08/28/2018 367* 38 - 126 U/L Final   Total Bilirubin 08/28/2018 0.4  0.3 - 1.2 mg/dL Final   GFR calc non Af Amer 08/28/2018 30* >60 mL/min Final   GFR calc Af Amer 08/28/2018 35* >60 mL/min Final   Anion gap 08/28/2018 8  5 - 15 Final   Performed at [facility], [address], [place], [place] 11/10/1955   WBC 08/28/2018 8.3  4.0 - 10.5 K/uL Final   RBC 08/28/2018 4.10* 4.22 - 5.81 MIL/uL Final  Hemoglobin 08/28/2018 13.2  13.0 - 17.0 g/dL Final   HCT 08/28/2018 39.2  39.0 - 52.0 % Final   MCV 08/28/2018 95.6  80.0 - 100.0 fL Final   MCH 08/28/2018 32.2  26.0 - 34.0 pg Final   MCHC 08/28/2018 33.7  30.0 - 36.0 g/dL Final   RDW 08/28/2018 13.8  11.5 - 15.5 % Final   Platelets 08/28/2018 226  150 - 400 K/uL Final   nRBC 08/28/2018 0.0  0.0 - 0.2 % Final   Neutrophils Relative % 08/28/2018 67  % Final   Neutro Abs 08/28/2018 5.6  1.7 - 7.7 K/uL Final   Lymphocytes Relative 08/28/2018 25  % Final   Lymphs Abs 08/28/2018 2.1  0.7 - 4.0 K/uL Final   Monocytes Relative 08/28/2018 5  % Final   Monocytes Absolute 08/28/2018 0.5  0.1 - 1.0 K/uL Final   Eosinophils Relative 08/28/2018 2  % Final   Eosinophils Absolute 08/28/2018 0.2  0.0 - 0.5 K/uL Final   Basophils Relative 08/28/2018 1  % Final   Basophils Absolute 08/28/2018 0.1  0.0 - 0.1 K/uL Final   Immature Granulocytes 08/28/2018 0  % Final   Abs Immature Granulocytes 08/28/2018 0.02  0.00 - 0.07 K/uL Final   Performed at [facility], [address], [place], [place] [address]    Assessment:  Michael Small is a 63 y.o. male with metastatic prostate cancer s/p transrectal prostate biopsies and cystoscopy on 05/24/2017.  Prostate biopsies revealed acinar adenocarcinoma involving all 12 cores.  Adenocarcinoma involved colonic mucosa and skeletal muscle.  Gleason score was 9 (4+5), grade group 5.  Pathologic stage was T4N1M1c.  PSA was 2338.8 on  04/24/2017.  Abdomen and pelvic CT on 04/27/2017 revealed prominent pelvic adenopathy with pathologic retroperitoneal adenopathy, abnormal retroperitoneal stranding, and abnormal inflammatory type stranding along the pelvic sidewalls and perirectal space.  A representative right external iliac lymph node measured 3.2 cm.  A left external iliac lymph node measured 2.1 cm.  There was conglomerate left periaortic adenopathy 2.1 cm and a 1.4 cm aortocaval node.   There was abnormal adenopathy in the perirectal space with prominent wall thickening in the rectum and moderate wall thickening in the sigmoid colon. The findings along the pelvic sidewalls appeared to be causing distal ureteral obstruction resulting in moderate bilateral hydronephrosis and hydroureter.  Lymphoma or prostate cancer was favored. A right external iliac node measured 3.2 cm.  There was bladder wall thickening suggesting cystitis.  There was prostatomegaly.  Bone scan on 06/22/2017 revealed findings consistent with diffuse metastatic disease.  There was activity in right temporal area, sternum, anterior and posterior ribs bilaterally, and entire spine.  He received Lupron 22.5 mg every 3 months (06/01/2017; 10/02/2017; last 06/27/2018) and Degarelix 240 mg on 06/15/2017.  He received Casodex from 06/13/2017 - 06/15/2017.    He began abiraterone on 02/28/2018 and discontinued after 2 days secondary to nausea, vomiting, diarrhea, and fatigue.  He restarted abiraterone (1 pill/day) on 03/16/2018 and increased to 2 pills/day on 03/23/2018.  He increased to 4 tablets in mid 03/2018.  Testosterone was 98 on 06/14/2017, < 3 on 09/07/2017, and < 3 on 03/16/2018.  PSA has been followed:  2338.8 on 04/24/2017, 3603.0 on 06/23/2017, 1035 on 07/26/2017, 241 on 09/07/2017, 342 on 01/23/2018, 107.14 on 04/24/2018, 116.03 on 05/22/2018, 116.52 on 06/27/2018, 101.37 on 07/26/2018, and 180.0 on 08/28/2018.  Abdomen and pelvic CT on 09/11/2018  revealed no acute findings.  Bone scan on 09/11/2018  revealed worsening bony metastatic disease in the right humerus, left humerus, right side of the calvarium, the left side of the manubrium skin, and in the bilateral femurs.  There was persistent metastatic disease in the pelvic bones, sternum, and spine.  There was subtle persistent metastases in the posterior ribs bilaterally. The metastasis in a medial left posterior rib was more prominent.  The lack of soft tissue uptake was consistent with a super scan due to the number metastases.  He was admitted to [facility] from 06/12/2017 - 06/15/2017 with hyperkalemia and acute renal failure.  Creatinine was 7.23.  He underwent emergent dialysis x1. He is s/p bilateral external nephrostomy tube placement on 06/14/2017 (stent exchange 03/20/2018).  He was admitted to [facility] from 07/28/2017 - 07/31/2017 with sepsis secondary to UTI with MSSA bacteremia.    Bilateral lower extremity duplex on 06/12/2017 revealed no evidence of DVT.   Code status is DNR/DNI.  Symptomatically, he has diffuse bone pain.  Plan: 1.   Metastatic prostate cancer             Review abdomen and pelvic CT plus bone scan with patient.  Images personally reviewed.  Agree with radiology interpretation.  He has progressive bone metastasis.  Discuss obtaining plain films of weight bearing bones (femurs).  Discuss discontinuation of abiraterone.  Discuss patient's thoughts about chemotherapy- declines at this time.  Discuss enzalutamide.  Side effects reviewed.   Discuss follow-up with Dr. [name]- called.   Review ichemic heart disease was more common in patients taking enzalutamide compared to placebo.  Discuss consideration of Xofigo (radium-223).   Side effects reviewed.   Discuss consideration if no plan for chemotherapy as can cause myelosuppression.   Continue  Lupron every 3 months (due 09/26/2018).  Discuss plan for plain films of weight bearing lesions (femurs). 2.   Renal  insufficiency Patient s/p bilateral nephrostomy tube placement and exchange. Confirm next exchange date. Creatinine was 2.23 on 08/28/2018 (improved) 3.   Cancer-related pain       Patient taking oxycodone 10 mg 4-6 x/day (40-60 mg total).  In addition taking prn: hydrocodone/acetaminophen 10/325 and ibuprofen.  Begin Fentanyl patch 12-25 mcg/hr.  Continue pain dairy and adjust every week. 4.   Plain films of right and left femur. 5.   Preauth enzalutamide.  6.   RTC on 0427/2020 for MD assessment, labs (CBC with diff, CMP, Mg) and initiation of enzalutamide (if ok'd by cardiology).   I discussed the assessment and treatment plan with the patient.  The patient was provided an opportunity to ask questions and all were answered.  The patient agreed with the plan and demonstrated an understanding of the instructions.  The patient was advised to call back or seek an in person evaluation if the symptoms worsen or if the condition fails to improve as anticipated.  I provided 21 minutes (2:34 PM  - 2:55 PM) of non-face-to-face time during this encounter.  I provided these services from the [facility] office.   Lequita Asal, MD, PhD  09/13/2018, 2:34 PM

## 2018-09-13 NOTE — Progress Notes (Signed)
DISCONTINUE ON PATHWAY REGIMEN - Prostate     Daily:     Abiraterone acetate      Prednisone   **Always confirm dose/schedule in your pharmacy ordering system**  REASON: Disease Progression PRIOR TREATMENT: POS76: Abiraterone 1,000 mg Daily + Prednisone 5 mg BID  Until Progression or Toxicity TREATMENT RESPONSE: Partial Response (PR)  START ON PATHWAY REGIMEN - Prostate     Daily:     Enzalutamide   **Always confirm dose/schedule in your pharmacy ordering system**  Patient Characteristics: Adenocarcinoma, Distant Metastases, Castration Resistant, Symptomatic, Prior Docetaxel/Docetaxel Ineligible Histology: Adenocarcinoma Therapeutic Status: Distant Metastases  Intent of Therapy: Non-Curative / Palliative Intent, Discussed with Patient

## 2018-09-14 ENCOUNTER — Telehealth: Payer: Self-pay | Admitting: Pharmacist

## 2018-09-14 ENCOUNTER — Other Ambulatory Visit: Payer: Self-pay | Admitting: *Deleted

## 2018-09-14 DIAGNOSIS — C7951 Secondary malignant neoplasm of bone: Secondary | ICD-10-CM

## 2018-09-14 DIAGNOSIS — C61 Malignant neoplasm of prostate: Secondary | ICD-10-CM

## 2018-09-14 MED ORDER — ENZALUTAMIDE 40 MG PO CAPS: 160.0000 mg | ORAL_CAPSULE | Freq: Every day | ORAL | 0 refills | Status: DC

## 2018-09-14 NOTE — Telephone Encounter (Signed)
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  Prescription dose and frequency assessed.   Current medication list in Epic reviewed, several DDIs with [facility] identified: - Xtandi may decrease the serum concentration of fentanyl, amlodipine, hydrocodone, ondansetron. Monitor patient for decreased effectiveness of the listed medications (eg, pain, BP, and nausea control issues). No baseline dose adjustment needed.   Prescription has been e-scribed to the [facility] for benefits analysis and approval.  Oral Oncology Clinic will continue to follow for insurance authorization, copayment issues, initial counseling and start date.  Darl Pikes, PharmD, BCPS, [facility], [place] Hematology/Oncology Clinical Pharmacist [facility]/AP Oral [place] [facility] [phone]  09/14/2018 2:49 PM

## 2018-09-14 NOTE — Patient Outreach (Signed)
Outreach call for insurance screening, no answer to telephone, left voicemail requesting return phone call, mailed unsuccessful outreach letter to pt home.  PLAN Outreach pt in 3-4 business days  Jacqlyn Larsen [facility], [place] [phone]

## 2018-09-14 NOTE — Telephone Encounter (Addendum)
Oral Oncology Pharmacist Encounter   Prior Authorization for [facility] has been approved through [place].   PA completed via [place] [employer]  PA# [id number]  Effective dates: 09/14/2018 through 03/13/2019   Oral Oncology Clinic will continue to follow.   Darl Pikes, PharmD, BCPS. [facility] Hematology/Oncology Clinical Pharmacist [facility]/AP Oral Chemotherapy Navigation Clinic [phone]  09/14/2018 3:10 PM

## 2018-09-18 ENCOUNTER — Encounter: Payer: Self-pay | Admitting: Hematology and Oncology

## 2018-09-19 ENCOUNTER — Telehealth: Payer: Self-pay

## 2018-09-19 ENCOUNTER — Other Ambulatory Visit: Payer: Self-pay | Admitting: *Deleted

## 2018-09-19 NOTE — Patient Outreach (Signed)
Outreach call to pt for screening/ insurance referral, (2nd attempt), no answer to telephone and no option to leave voicemail.  PLAN Outreach pt in 3-4 business days  Jacqlyn Larsen [facility], [place] [phone]

## 2018-09-19 NOTE — Telephone Encounter (Signed)
PA has been approval for Xtandi 40 mg until 03/13/2019. Patient has been informed.

## 2018-09-21 NOTE — Progress Notes (Signed)
[facility]  [address], [address] [place], [place] [address] Phone: [phone]  Fax: [phone]   Office Visit:  09/24/2018  Referring physician: Birdie Sons, MD  Chief Complaint: Michael Small is a 63 y.o. male metastatic prostate cancer on abiraterone and Xtandi  HPI: The patient was last seen in the oncology clinic on 09/13/2018. At that time, he had diffuse bone pain and was taking 4-6 oxycodone/day, with hydrocodone with Tylenol 10/325 in between if needed in addition to ibuprofen.  He declined chemotherapy.  We discussed Xtandi and Xofigo (radium-223).  Plain films of weight bearing lesions were discussed.  I discussed the need for follow-up with his cardiologist, Dr [name], given his underlying cardiac disease.  In the interim, the patient was approved for treatment with Xtandi. He has not had plain films of his femurs.  He has not met with Dr. [name].  During the interim, he has been "ok".  Pain is "ok".  He is using 4 oxycodone/day and 4 hydrocodone with Tylenol/day.  He use the Fentanyl 12 mcg/hr patches.  He did not notice any improvement in pain.  He is unsure when he needs to have his nephrostomy tubes exchanged.   Past Medical History:  Diagnosis Date   Chronic kidney disease    Depression    GERD (gastroesophageal reflux disease)    Hyperlipidemia    Hypertension    Myocardial infarction [facility])    s/p stent   Neuromuscular disorder (HCC)    numbness in arms and legs   Prostate cancer [facility])     Past Surgical History:  Procedure Laterality Date   CORONARY ANGIOPLASTY WITH STENT PLACEMENT  12/2011   [facility]   CYSTOSCOPY W/ RETROGRADES Bilateral 05/24/2017   Procedure: CYSTOSCOPY WITH RETROGRADE PYELOGRAM;  Surgeon: [name], MD;  Location: [facility] ORS;  Service: Urology;  Laterality: Bilateral;   CYSTOSCOPY W/ RETROGRADES Bilateral 11/21/2017   Procedure: CYSTOSCOPY WITH RETROGRADE PYELOGRAM;   Surgeon: [name], MD;  Location: [facility] OR[facility];  Service: Urology;  Laterality: Bilateral;   CYSTOSCOPY W/ RETROGRADES Bilateral 03/20/2018   Procedure: CYSTOSCOPY WITH RETROGRADE PYELOGRAM;  Surgeon: [name], MD;  Location: [facility] OR[facility];  Service: Urology;  Laterality: Bilateral;   CYSTOSCOPY W/ URETERAL STENT PLACEMENT Bilateral 11/21/2017   Procedure: CYSTOSCOPY WITH STENT REPLACEMENT;  Surgeon: [name], MD;  Location: [facility] OR[facility];  Service: Urology;  Laterality: Bilateral;   CYSTOSCOPY W/ URETERAL STENT REMOVAL Bilateral 11/21/2017   Procedure: CYSTOSCOPY WITH STENT REMOVAL;  Surgeon: [name], MD;  Location: [facility] OR[facility];  Service: Urology;  Laterality: Bilateral;   CYSTOSCOPY WITH STENT PLACEMENT Right 05/24/2017   Procedure: CYSTOSCOPY WITH STENT PLACEMENT;  Surgeon: [name], MD;  Location: [facility];  Service: Urology;  Laterality: Right;   CYSTOSCOPY WITH STENT PLACEMENT Bilateral 03/20/2018   Procedure: CYSTOSCOPY WITH STENT EXCHANGE;  Surgeon: [name], MD;  Location: [facility];  Service: Urology;  Laterality: Bilateral;   IR NEPHROSTOGRAM LEFT THRU EXISTING ACCESS  07/21/2017   IR NEPHROSTOGRAM LEFT THRU EXISTING ACCESS  08/18/2017   IR NEPHROSTOGRAM RIGHT THRU EXISTING ACCESS  08/18/2017   IR NEPHROSTOMY PLACEMENT LEFT  06/15/2017   IR NEPHROSTOMY PLACEMENT RIGHT  06/14/2017   IR URETERAL STENT PLACEMENT EXISTING ACCESS LEFT  07/21/2017   PICC LINE INSERTION N/A 08/02/2017   Procedure: PICC LINE INSERTION;  Surgeon: Katha Cabal, MD;  Location: [facility];  Service: Cardiovascular;  Laterality: N/A;  PROSTATE BIOPSY N/A 05/24/2017   Procedure: PROSTATE BIOPSY;  Surgeon: [name], MD;  Location: [facility];  Service: Urology;  Laterality: N/A;   TEE WITHOUT CARDIOVERSION N/A 07/31/2017   Procedure: TRANSESOPHAGEAL ECHOCARDIOGRAM (TEE);  Surgeon: Teodoro Spray, MD;  Location: [facility];  Service: Cardiovascular;  Laterality:  N/A;   US ECHOCARDIOGRAPHY  12/2011   Mild LV dysfunction, EF=45%    Family History  Problem Relation Age of Onset   Cancer Mother    Hypertension Mother    Diabetes Father    Heart disease Father    Cancer Paternal Grandfather    Prostate cancer Neg Hx    Bladder Cancer Neg Hx    Kidney cancer Neg Hx     Social History:  reports that he has been smoking cigars. He has a 24.00 pack-year smoking history. He has never used smokeless tobacco. He reports that he does not drink alcohol or use drugs.  He has smoked 1 pack per day since age 30 (1 years).  He smokes small cigars.  He does not drink alcohol, but previously drank heavily.  He describes being exposed to Round Up and asbestos.  He has been disabled for several years secondary to "nerve damage and problems in his legs, arms, and bad back".  He fell off a ladder.  He previously worked for a [employer], [employer].  He lives in [place].  He has step children.  The patient is alone today.  Allergies:  Allergies  Allergen Reactions   Zytiga [Abiraterone] Diarrhea    Current Medications: Current Outpatient Medications  Medication Sig Dispense Refill   amLODipine (NORVASC) 10 MG tablet Take 1 tablet (10 mg total) by mouth daily. 90 tablet 3   enzalutamide (XTANDI) 40 MG capsule Take 4 capsules (160 mg total) by mouth daily. 120 capsule 0   fentaNYL (DURAGESIC) 12 MCG/HR Place 1 patch onto the skin every 3 (three) days. 3 patch 0   HYDROcodone-acetaminophen (NORCO) 10-325 MG tablet Take 1 tablet by mouth every 3 (three) hours as needed. For cancer related pain Dx: C61 120 tablet 0   Oxycodone HCl 10 MG TABS One tablet every six hours scheduled, and one every three hours as needed for breakthrough pain 120 tablet 0   buPROPion (WELLBUTRIN SR) 150 MG 12 hr tablet Take 1 tablet (150 mg total) by mouth 2 (two) times daily. (Patient not taking: Reported on 09/24/2018) 180 tablet 4   diphenoxylate-atropine (LOMOTIL)  2.5-0.025 MG tablet Take 1 tablet by mouth 4 (four) times daily as needed for diarrhea or loose stools. (Patient not taking: Reported on 09/13/2018) 120 tablet 3   ondansetron (ZOFRAN) 8 MG tablet TAKE 1 TABLET BY MOUTH EVERY 8 HOURS AS NEEDED FOR NAUSEA (Patient not taking: Reported on 09/13/2018) 30 tablet 5   No current facility-administered medications for this visit.     Review of Systems: Review of Systems  Constitutional: Negative.  Negative for chills, diaphoresis, fever, malaise/fatigue and weight loss (up 6 pounds since 07/26/2018).       Feels "ok".  HENT: Negative.  Negative for congestion, ear discharge, ear pain, nosebleeds, sinus pain, sore throat and tinnitus.   Eyes: Negative.  Negative for blurred vision, double vision, photophobia and pain.  Respiratory: Negative.  Negative for cough, hemoptysis, sputum production and shortness of breath.   Cardiovascular: Negative.  Negative for chest pain, palpitations, orthopnea and PND.  Gastrointestinal: Negative.  Negative for abdominal pain, blood in stool, constipation, diarrhea, heartburn, melena, nausea and  vomiting.  Genitourinary: Negative for dysuria, flank pain, frequency, hematuria and urgency.       Unsure when nephrostomy tubes need to be exchanged.  Musculoskeletal: Negative for falls, joint pain and myalgias.       Bone pain.  Skin: Negative.  Negative for itching and rash.  Neurological: Positive for weakness (general). Negative for dizziness, sensory change, speech change, focal weakness and headaches.  Endo/Heme/Allergies: Negative.  Does not bruise/bleed easily.  Psychiatric/Behavioral: Negative.  Negative for depression and memory loss. The patient is not nervous/anxious and does not have insomnia.    Performance status (ECOG): 1  Vital signs: BP 132/78    Pulse 86    Temp 98.8 F (37.1 C) (Tympanic)    Resp 18    Ht 5' 11"  (1.803 m)    Wt 162 lb 11.2 oz (73.8 kg)    BMI 22.69 kg/m    Physical Exam    Constitutional: He is oriented to person, place, and time. He appears well-developed and well-nourished. No distress.  HENT:  Head: Normocephalic and atraumatic.  Mouth/Throat: Oropharynx is clear and moist. No oropharyngeal exudate.  Near alopecia.  Scruffy beard.  Edentulous.  Eyes: Pupils are equal, round, and reactive to light. Conjunctivae and EOM are normal. No scleral icterus.  Neck: Normal range of motion. Neck supple. No JVD present.  Cardiovascular: Normal rate, regular rhythm and normal heart sounds. Exam reveals no gallop and no friction rub.  No murmur heard. Pulmonary/Chest: Effort normal and breath sounds normal. No respiratory distress. He has no wheezes. He has no rales.  Abdominal: Soft. Bowel sounds are normal. He exhibits no distension. There is no abdominal tenderness. There is no rebound and no guarding.  Musculoskeletal: Normal range of motion.        General: No tenderness or edema.  Lymphadenopathy:    He has no cervical adenopathy.  Neurological: He is alert and oriented to person, place, and time. He has normal reflexes.  Skin: Skin is warm and dry. No rash noted. He is not diaphoretic. No erythema. No pallor.  Psychiatric: He has a normal mood and affect. His behavior is normal. Judgment and thought content normal.  Nursing note and vitals reviewed.    Imaging studies:  04/27/2017:  Abdomen and pelvic CT revealed prominent pelvic adenopathy with pathologic retroperitoneal adenopathy, abnormal retroperitoneal stranding, and abnormal inflammatory type stranding along the pelvic sidewalls and perirectal space. A representative right external iliac lymph node measured 3.2 cm. A left external iliac lymph node measured 2.1 cm. There was conglomerate left periaortic adenopathy 2.1 cm and a 1.4 cm aortocaval node. There was abnormal adenopathy in the perirectal spacewith prominent wall thickening in the rectumand moderate wall thickening in the sigmoid colon. The  findings along the pelvic sidewalls appeared to be causingdistal ureteral obstructionresulting in moderatebilateral hydronephrosisand hydroureter. Lymphoma or prostate cancer was favored. A right external iliac node measured 3.2 cm. There was bladder wall thickeningsuggesting cystitis. There was prostatomegaly. 06/22/2017:  Bone scanrevealed findings consistent with diffuse metastatic disease. There was activity in right temporal area, sternum, anterior and posterior ribs bilaterally, and entire spine. 06/27/2017:  Chest CTrevealed right infrahilar prominence, progressed from prior CT, worrisome for a node/nodule, but poorly visualized given the lack of IV contrast. There was mid thoracic lymphadenopathy, progressed, including a dominant 14 mm short axis node at the left thoracic inlet. There was stable 19 x 9 mm irregular nodule at the right lung apex. There was a sclerotic osseous metastases at T8-9. 03/09/2018:  Bone scanrevealed widespread osseous metastatic disease with super scan appearance, progressive since prior exam. 03/14/2018:  Abdomen and pelvic CTrevealed interval progression of widespread sclerotic bony metastatic involvement with new areas of lucency in the axial skeleton and bony pelvis. There was evidence of interval renal atrophy, right greater than left. There was mild persistent right hydronephrosis. There was interval removal of percutaneous nephrostomy catheters with internal ureteral stents visualized bilaterally in situ. There was mild lymphadenopathy in the pelvic sidewall bilaterally. There was diffuse body wall, retroperitoneal, and mesenteric edema. There was persistent circumferential wall thickening in the distal rectum. 06/29/2018:  Low dose chest CT revealed Lung-RADS 2, benign appearance or behavior. There was stable nodular opacity in the right upper lobe which likely reflected pleural parenchymal scarring.  There was improved thoracic lymphadenopathy.   There was multifocal/diffuse osseous metastases throughout the visualized axial and appendicular skeleton, progressed. 09/11/2018:  Abdomen and pelvic CT revealed grossly stable widespread osseous metastatic disease. There was no new pathologic fracture.  There was small retroperitoneal and pelvic lymph nodes, stable.  Both ureteral stents appeared adequately positioned. There was stable right-sided hydronephrosis and hydroureter with chronic renal cortical thinning.  There was no evidence of urinary tract calculus.  There was interval decreased generalized soft tissue and perirectal edema.  There were no acute findings. 09/11/2018:Bone scan revealed worsening bony metastatic disease in the right humerus, left humerus, right side of the calvarium, the left side of the manubrium skin, and in the bilateral femurs.  There was persistent metastatic disease in the pelvic bones, sternum, and spine.  There was subtle persistent metastases in the posterior ribs bilaterally. The metastasis in a medial left posterior rib was more prominent.  The lack of soft tissue uptake was consistent with a super scan due to the number metastases   Appointment on 09/24/2018  Component Date Value Ref Range Status   Magnesium 09/24/2018 2.3  1.7 - 2.4 mg/dL Final   Performed at [facility], [address], [place], [place] [address]   Sodium 09/24/2018 137  135 - 145 mmol/L Final   Potassium 09/24/2018 4.8  3.5 - 5.1 mmol/L Final   Chloride 09/24/2018 103  98 - 111 mmol/L Final   CO2 09/24/2018 26  22 - 32 mmol/L Final   Glucose, Bld 09/24/2018 109* 70 - 99 mg/dL Final   BUN 09/24/2018 32* 8 - 23 mg/dL Final   Creatinine, Ser 09/24/2018 2.26* 0.61 - 1.24 mg/dL Final   Calcium 09/24/2018 9.3  8.9 - 10.3 mg/dL Final   Total Protein 09/24/2018 7.4  6.5 - 8.1 g/dL Final   Albumin 09/24/2018 4.0  3.5 - 5.0 g/dL Final   AST 09/24/2018 16  15 - 41 U/L Final   ALT 09/24/2018 11  0 - 44 U/L Final    Alkaline Phosphatase 09/24/2018 393* 38 - 126 U/L Final   Total Bilirubin 09/24/2018 0.4  0.3 - 1.2 mg/dL Final   GFR calc non Af Amer 09/24/2018 30* >60 mL/min Final   GFR calc Af Amer 09/24/2018 34* >60 mL/min Final   Anion gap 09/24/2018 8  5 - 15 Final   Performed at [facility], [address] [place], [place] [address]   WBC 09/24/2018 7.0  4.0 - 10.5 K/uL Final   RBC 09/24/2018 4.25  4.22 - 5.81 MIL/uL Final   Hemoglobin 09/24/2018 13.5  13.0 - 17.0 g/dL Final   HCT 09/24/2018 41.0  39.0 - 52.0 % Final   MCV 09/24/2018 96.5  80.0 - 100.0 fL Final   MCH 09/24/2018 31.8  26.0 - 34.0 pg Final   MCHC 09/24/2018 32.9  30.0 - 36.0 g/dL Final   RDW 09/24/2018 13.7  11.5 - 15.5 % Final   Platelets 09/24/2018 248  150 - 400 K/uL Final   nRBC 09/24/2018 0.0  0.0 - 0.2 % Final   Neutrophils Relative % 09/24/2018 52  % Final   Neutro Abs 09/24/2018 3.7  1.7 - 7.7 K/uL Final   Lymphocytes Relative 09/24/2018 34  % Final   Lymphs Abs 09/24/2018 2.4  0.7 - 4.0 K/uL Final   Monocytes Relative 09/24/2018 9  % Final   Monocytes Absolute 09/24/2018 0.6  0.1 - 1.0 K/uL Final   Eosinophils Relative 09/24/2018 4  % Final   Eosinophils Absolute 09/24/2018 0.3  0.0 - 0.5 K/uL Final   Basophils Relative 09/24/2018 1  % Final   Basophils Absolute 09/24/2018 0.1  0.0 - 0.1 K/uL Final   Immature Granulocytes 09/24/2018 0  % Final   Abs Immature Granulocytes 09/24/2018 0.02  0.00 - 0.07 K/uL Final   Performed at [facility], [address], [place], [place] [address]    Assessment:  Michael Small is a 63 y.o. male with metastaticprostate cancers/p transrectal prostate biopsies and cystoscopy on 05/24/2017. Prostate biopsies revealed acinar adenocarcinoma involving all 12 cores. Adenocarcinoma involved colonic mucosa and skeletal muscle. Gleason score was 9 (4+5), grade group 5. Pathologic stagewas [id number]. PSAwas 2338.8 on  04/24/2017.  Abdomen and pelvic CT on 04/27/2017 revealed prominent pelvic adenopathy with pathologic retroperitoneal adenopathy, abnormal retroperitoneal stranding, and abnormal inflammatory type stranding along the pelvic sidewalls and perirectal space. A representative right external iliac lymph node measured 3.2 cm. A left external iliac lymph node measured 2.1 cm. There was conglomerate left periaortic adenopathy 2.1 cm and a 1.4 cm aortocaval node. There was abnormal adenopathy in the perirectal spacewith prominent wall thickening in the rectumand moderate wall thickening in the sigmoid colon. The findings along the pelvic sidewalls appeared to be causingdistal ureteral obstructionresulting in moderatebilateral hydronephrosisand hydroureter. Lymphoma or prostate cancer was favored. A right external iliac node measured 3.2 cm. There was bladder wall thickeningsuggesting cystitis. There was prostatomegaly.  Bone scanon 06/22/2017 revealed findings consistent with diffuse metastatic disease. There was activity in right temporal area, sternum, anterior and posterior ribs bilaterally, and entire spine.  He received Lupron 22.5 mg every 3 months (06/01/2017; 10/02/2017; last01/29/2020) and Degarelix240 mg on 06/15/2017. He received Casodexfrom 06/13/2017 - 06/15/2017.   He began abirateroneon 02/28/2018 and discontinued after 2 days secondary to nausea, vomiting, diarrhea, and fatigue. He restarted abiraterone(1 pill/day) on 03/16/2018 and increased to 2 pills/day on 03/23/2018. He increased to 4 tablets in mid 03/2018. Testosteronewas 98 on 06/14/2017, <3 on 09/07/2017, and <3 on 03/16/2018.  PSAhas been followed: 2338.8 on 04/24/2017, 3603.0 on 06/23/2017, 1035 on 07/26/2017, 241 on 09/07/2017, 342 on 01/23/2018, 107.14 on 04/24/2018, 116.03 on 05/22/2018, 116.52 on 06/27/2018, 101.37 on 07/26/2018, and 180.0 on 08/28/2018.  Abdomen and pelvic CT on 09/11/2018  revealed no acute findings.  Bone scan on 09/11/2018 revealed worsening bony metastatic disease in the right humerus, left humerus, right side of the calvarium, the left side of the manubrium skin, and in the bilateral femurs.  There was persistent metastatic disease in the pelvic bones, sternum, and spine.  There was subtle persistent metastases in the posterior ribs bilaterally. The metastasis in a medial left posterior rib was more prominent.  The lack of  soft tissue uptake was consistent with a super scan due to the number metastases.  He was admitted to [facility] 06/12/2017 - 06/15/2017 with hyperkalemia and acute renal failure. Creatinine was 7.23. He underwent emergent dialysis x1. He is s/p bilateral external nephrostomy tubeplacement on 06/14/2017 (stent exchange 03/20/2018).  He was admitted to [facility] 07/28/2017 - 07/31/2017 with sepsis secondary to UTI with MSSA bacteremia.   Bilateral lower extremity duplexon 06/12/2017 revealed no evidence of DVT. Code statusis DNR/DNI.  Symptomatically, he feels "ok".  Pain is modestly controlled.  Plan: 1.   Metastatic prostate cancer  Review last imaging which documented progressive bone metastasis.  Discuss correlation with PSA.  Discuss need for plain films of weight bearing bones (femurs).             Discuss plan to start enzalutamide.   Potential side effects re-reviewed.   Contact Dr. Alveria Apley office for evaluation pretreatment.   Patient has known ichemic heart disease but denies any recent chest pain.                  Lupron today. 2.   Renal insufficiency Creatinine 2.26. Patient s/p bilateral nephrostomy tube placement and exchange. Last exchange noted on 03/20/2018 (confirmed). Contact Dr Dene Gentry office- done. 3.   Cancer-related pain Patient taking oxycodone 10 mg 4 x/day (40 mg total) and hydrocodone/acetaminophen 10/325 4 x/day.       Fentanyl patch 73mg/hr ineffective.  Increase patch to  25 mcg/hr (dis: #3).  Continue to titrate dose.       Continue pain dairy and adjust every 9 days. 4.   Plain films of femurs today. 5.   RTC in 2 weeks for MD assessment and labs (CBC with diff, CMP).   I discussed the assessment and treatment plan with the patient.  The patient was provided an opportunity to ask questions and all were answered.  The patient agreed with the plan and demonstrated an understanding of the instructions.  The patient was advised to call back or seek an in person evaluation if the symptoms worsen or if the condition fails to improve as anticipated.  MNolon Stalls MD, PhD  09/24/2018, 9:49 AM

## 2018-09-24 ENCOUNTER — Inpatient Hospital Stay: Payer: Medicare Other

## 2018-09-24 ENCOUNTER — Encounter: Payer: Self-pay | Admitting: Hematology and Oncology

## 2018-09-24 ENCOUNTER — Telehealth: Payer: Self-pay

## 2018-09-24 ENCOUNTER — Inpatient Hospital Stay (HOSPITAL_BASED_OUTPATIENT_CLINIC_OR_DEPARTMENT_OTHER): Payer: Medicare Other | Admitting: Hematology and Oncology

## 2018-09-24 ENCOUNTER — Other Ambulatory Visit: Payer: Self-pay

## 2018-09-24 VITALS — BP 132/78 | HR 86 | Temp 98.8°F | Resp 18 | Ht 71.0 in | Wt 162.7 lb

## 2018-09-24 DIAGNOSIS — I129 Hypertensive chronic kidney disease with stage 1 through stage 4 chronic kidney disease, or unspecified chronic kidney disease: Secondary | ICD-10-CM

## 2018-09-24 DIAGNOSIS — C7951 Secondary malignant neoplasm of bone: Secondary | ICD-10-CM

## 2018-09-24 DIAGNOSIS — F17210 Nicotine dependence, cigarettes, uncomplicated: Secondary | ICD-10-CM

## 2018-09-24 DIAGNOSIS — Z7189 Other specified counseling: Secondary | ICD-10-CM

## 2018-09-24 DIAGNOSIS — C61 Malignant neoplasm of prostate: Secondary | ICD-10-CM | POA: Diagnosis not present

## 2018-09-24 DIAGNOSIS — Z5111 Encounter for antineoplastic chemotherapy: Secondary | ICD-10-CM | POA: Diagnosis not present

## 2018-09-24 DIAGNOSIS — G893 Neoplasm related pain (acute) (chronic): Secondary | ICD-10-CM | POA: Diagnosis not present

## 2018-09-24 DIAGNOSIS — Z79899 Other long term (current) drug therapy: Secondary | ICD-10-CM | POA: Diagnosis not present

## 2018-09-24 DIAGNOSIS — N189 Chronic kidney disease, unspecified: Secondary | ICD-10-CM

## 2018-09-24 DIAGNOSIS — N289 Disorder of kidney and ureter, unspecified: Secondary | ICD-10-CM

## 2018-09-24 LAB — COMPREHENSIVE METABOLIC PANEL
ALT: 11 U/L (ref 0–44)
AST: 16 U/L (ref 15–41)
Albumin: 4 g/dL (ref 3.5–5.0)
Alkaline Phosphatase: 393 U/L — ABNORMAL HIGH (ref 38–126)
Anion gap: 8 (ref 5–15)
BUN: 32 mg/dL — ABNORMAL HIGH (ref 8–23)
CO2: 26 mmol/L (ref 22–32)
Calcium: 9.3 mg/dL (ref 8.9–10.3)
Chloride: 103 mmol/L (ref 98–111)
Creatinine, Ser: 2.26 mg/dL — ABNORMAL HIGH (ref 0.61–1.24)
GFR calc Af Amer: 34 mL/min — ABNORMAL LOW (ref >60)
GFR calc non Af Amer: 30 mL/min — ABNORMAL LOW (ref >60)
Glucose, Bld: 109 mg/dL — ABNORMAL HIGH (ref 70–99)
Potassium: 4.8 mmol/L (ref 3.5–5.1)
Sodium: 137 mmol/L (ref 135–145)
Total Bilirubin: 0.4 mg/dL (ref 0.3–1.2)
Total Protein: 7.4 g/dL (ref 6.5–8.1)

## 2018-09-24 LAB — CBC WITH DIFFERENTIAL/PLATELET
Abs Immature Granulocytes: 0.02 10*3/uL (ref 0.00–0.07)
Basophils Absolute: 0.1 10*3/uL (ref 0.0–0.1)
Basophils Relative: 1 %
Eosinophils Absolute: 0.3 10*3/uL (ref 0.0–0.5)
Eosinophils Relative: 4 %
HCT: 41 % (ref 39.0–52.0)
Hemoglobin: 13.5 g/dL (ref 13.0–17.0)
Immature Granulocytes: 0 %
Lymphocytes Relative: 34 %
Lymphs Abs: 2.4 10*3/uL (ref 0.7–4.0)
MCH: 31.8 pg (ref 26.0–34.0)
MCHC: 32.9 g/dL (ref 30.0–36.0)
MCV: 96.5 fL (ref 80.0–100.0)
Monocytes Absolute: 0.6 10*3/uL (ref 0.1–1.0)
Monocytes Relative: 9 %
Neutro Abs: 3.7 10*3/uL (ref 1.7–7.7)
Neutrophils Relative %: 52 %
Platelets: 248 10*3/uL (ref 150–400)
RBC: 4.25 MIL/uL (ref 4.22–5.81)
RDW: 13.7 % (ref 11.5–15.5)
WBC: 7 10*3/uL (ref 4.0–10.5)
nRBC: 0 % (ref 0.0–0.2)

## 2018-09-24 LAB — MAGNESIUM: Magnesium: 2.3 mg/dL (ref 1.7–2.4)

## 2018-09-24 MED ORDER — LEUPROLIDE ACETATE (3 MONTH) 22.5 MG IM KIT
22.5000 mg | PACK | Freq: Once | INTRAMUSCULAR | Status: AC
  Administered 2018-09-24: 22.5 mg via INTRAMUSCULAR

## 2018-09-24 NOTE — Patient Instructions (Signed)
Enzalutamide capsules What is this medicine? ENZALUTAMIDE blocks the effect of the male hormone called testosterone. This medicine is used for certain types of prostate cancer. This medicine may be used for other purposes; ask your health care provider or pharmacist if you have questions. COMMON BRAND NAME(S): XTANDI What should I tell my health care provider before I take this medicine? They need to know if you have any of these conditions: -bone problems -brain tumor -head injury -heart disease -history of stroke -seizures -an unusual or allergic reaction to enzalutamide, other medicines, foods, dyes, or preservatives -pregnant or trying to get pregnant -breast-feeding How should I use this medicine? Take this medicine by mouth with a glass of water. You can take it with or without food. If it upsets your stomach, take it with food. Follow the directions on the prescription label. Do not cut, crush, or chew this medicine. Take your medicine at regular intervals. Do not take it more often than directed. Do not stop taking except on your doctor's advice. Talk to your pediatrician regarding the use of this medicine in children. Special care may be needed. Overdosage: If you think you have taken too much of this medicine contact a poison control center or emergency room at once. NOTE: This medicine is only for you. Do not share this medicine with others. What if I miss a dose? If you miss a dose, take it as soon as you can. If it is almost time for your next dose, take only that dose and skip your missed dose. Do not take double or extra doses. What may interact with this medicine? This medicine may interact with the following medications: -alfentanil -bosentan -certain medications for seizures like carbamazepine, phenobarbital, and  phenytoin -cyclosporine -dihydroergotamine -efavirenz -ergotamine -etravirine -fentanyl -gemfibrozil -midazolam -modafinil -nafcillin -omeprazole -pimozide -quinidine -rifabutin -rifampin -rifapentine -St. John's Wort -sirolimus -tacrolimus -warfarin This list may not describe all possible interactions. Give your health care provider a list of all the medicines, herbs, non-prescription drugs, or dietary supplements you use. Also tell them if you smoke, drink alcohol, or use illegal drugs. Some items may interact with your medicine. What should I watch for while using this medicine? This medicine should not be used in women. Men should not father a child while taking this medicine and for 3 months after stopping it. There is a potential for serious side effects to an unborn child. Talk to your health care professional or pharmacist for more information. Women who are pregnant or may get pregnant must not handle enzalutamide capsules, as they could harm an unborn baby. Due to a risk of seizures with enzalutamide therapy, use caution when engaging in activities that could result in serious harm to yourself or others if you pass out. What side effects may I notice from receiving this medicine? Side effects that you should report to your doctor or health care professional as soon as possible: -allergic reactions like skin rash, itching or hives, swelling of the face, lips, or tongue -changes in vision -chest pain or tightness -confusion -falls -loss of memory -seizures -sudden numbness or weakness of the face, arm or leg -trouble speaking or understanding -trouble walking, dizziness, loss of balance or coordination Side effects that usually do not require medical attention (report to your doctor or health care professional if they continue or are bothersome): -blood in the urine -breathing problems -diarrhea -dizziness -headache -joint pain -pain, tingling, numbness in the  hands or feet -swelling of the ankles, feet, hands -unusually weak  or tired This list may not describe all possible side effects. Call your doctor for medical advice about side effects. You may report side effects to FDA at 1-800-FDA-1088. Where should I keep my medicine? Keep out of the reach of children. Store between 20 and 25 degrees C (68 and 77 degrees F). Throw away any unused medicine after the expiration date. NOTE: This sheet is a summary. It may not cover all possible information. If you have questions about this medicine, talk to your doctor, pharmacist, or health care provider.  2019 [employer] Standard (2016-12-14 14:49:39)

## 2018-09-24 NOTE — Patient Instructions (Signed)

## 2018-09-24 NOTE — Telephone Encounter (Signed)
Faxed new patinet information to Dr [name] office the patient has been schedule for 09/26/2018 at 10 : 00 AM. The patient was informed of the appointment time and date. The patient was agreeable and understanding

## 2018-09-24 NOTE — Progress Notes (Signed)
No new changes noted today 

## 2018-09-25 ENCOUNTER — Other Ambulatory Visit: Payer: Self-pay | Admitting: *Deleted

## 2018-09-25 MED ORDER — FENTANYL 25 MCG/HR TD PT72: 1.0000 | MEDICATED_PATCH | TRANSDERMAL | 0 refills | Status: AC

## 2018-09-26 ENCOUNTER — Inpatient Hospital Stay: Payer: Medicare Other

## 2018-09-26 DIAGNOSIS — R0602 Shortness of breath: Secondary | ICD-10-CM | POA: Diagnosis not present

## 2018-09-26 DIAGNOSIS — I25118 Atherosclerotic heart disease of native coronary artery with other forms of angina pectoris: Secondary | ICD-10-CM | POA: Diagnosis not present

## 2018-09-26 DIAGNOSIS — Z7689 Persons encountering health services in other specified circumstances: Secondary | ICD-10-CM | POA: Diagnosis not present

## 2018-10-01 ENCOUNTER — Other Ambulatory Visit: Payer: Self-pay | Admitting: Hematology and Oncology

## 2018-10-01 ENCOUNTER — Telehealth: Payer: Self-pay

## 2018-10-01 DIAGNOSIS — C7951 Secondary malignant neoplasm of bone: Secondary | ICD-10-CM

## 2018-10-01 DIAGNOSIS — G893 Neoplasm related pain (acute) (chronic): Secondary | ICD-10-CM

## 2018-10-01 DIAGNOSIS — C61 Malignant neoplasm of prostate: Secondary | ICD-10-CM

## 2018-10-01 NOTE — Telephone Encounter (Signed)
Contacted patient/wife regarding information below. Wife states patient will complete xrays within the next two days.   Also wife states patient is scheduled to get Echo and Stress test on Wednesday to determine eligibility of starting Enzalutamide.   Wife also reports patient is still experiencing pain with the increase in Fentanyl patch. Reports patient is taking medications around 0730-0830, mid morning, right after lunch, mid afternoon, supper, and bedtime. Reports he alternates the oxycodone and hydrocodone and at times, if it is needed, he will take them both together. Dr. Mike Gip made aware.       Please call patient  Received: 2 days ago  Message Contents  Corcoran, Drue Second, MD  Arlan Organ, RN         Tozer, ILIYA SPIVACK [[id number]]   Did he see Dr [name] last week? If so, we need to get his note and find out if he is cleared to begin enzalutamide.   He did not get the palin films of his femurs. This is the second time we discussed. Was there an issue in radiology.   Ask him to continue to keep a pain diary. If he is going to run out of his pain medications before his next appointment, please let us know when he needs a refill.   M

## 2018-10-02 ENCOUNTER — Telehealth: Payer: Self-pay

## 2018-10-02 ENCOUNTER — Other Ambulatory Visit: Payer: Self-pay

## 2018-10-02 ENCOUNTER — Encounter: Payer: Self-pay | Admitting: Hematology and Oncology

## 2018-10-02 DIAGNOSIS — G893 Neoplasm related pain (acute) (chronic): Secondary | ICD-10-CM

## 2018-10-02 DIAGNOSIS — C61 Malignant neoplasm of prostate: Secondary | ICD-10-CM

## 2018-10-02 MED ORDER — FENTANYL 12 MCG/HR TD PT72: 1.0000 | MEDICATED_PATCH | TRANSDERMAL | 0 refills | Status: DC

## 2018-10-02 NOTE — Telephone Encounter (Signed)
Spoke with patient's wife regarding pain. Wife states patient is taking Oxycodone 1 tablet x 4/day and Hydrocodone 1 tablet x 4/day (Alternating each approx. 3 hour in between and taking together with extreme pain) in addition to 25 MCG Fentanyl patch and Tylenol/Advil. Wife reports patient's pain is not controlled at this time. Wife reports patient does not get sleepy with current doses. Dr. Mike Gip made aware.   Dr. Mike Gip advised to add a 12 MCG Fentanyl patch in addition to current 25 MCG patch and to change Oxycodone and Hydrocodone patch to PRN only and to not take on routine basis. Educated wife on importance of following as we don't want to increase risk of overdose. Wife also made aware that patient needs to keep accurate pain diary. Advised Dr. Mike Gip would be in contact with her on Friday to determine pain control and possible changes to plan if necessary.   Educated wife on patch placement and repeated instructions. Wife verbalizes understanding and denies any questions.

## 2018-10-03 ENCOUNTER — Encounter: Payer: Medicare Other | Admitting: Hospice and Palliative Medicine

## 2018-10-03 ENCOUNTER — Other Ambulatory Visit: Payer: Self-pay | Admitting: Family Medicine

## 2018-10-03 DIAGNOSIS — I10 Essential (primary) hypertension: Secondary | ICD-10-CM | POA: Diagnosis not present

## 2018-10-03 DIAGNOSIS — I214 Non-ST elevation (NSTEMI) myocardial infarction: Secondary | ICD-10-CM | POA: Diagnosis not present

## 2018-10-03 DIAGNOSIS — G893 Neoplasm related pain (acute) (chronic): Secondary | ICD-10-CM

## 2018-10-03 DIAGNOSIS — I25118 Atherosclerotic heart disease of native coronary artery with other forms of angina pectoris: Secondary | ICD-10-CM | POA: Diagnosis not present

## 2018-10-03 DIAGNOSIS — I2510 Atherosclerotic heart disease of native coronary artery without angina pectoris: Secondary | ICD-10-CM | POA: Diagnosis not present

## 2018-10-03 NOTE — Telephone Encounter (Signed)
Pt needs a refill  On his hydrocodone 10-325  Oxycodone 10 mg  pt has cancer.  [facility] [address]  Thanks teri

## 2018-10-04 MED ORDER — OXYCODONE HCL 10 MG PO TABS: ORAL_TABLET | ORAL | 0 refills | Status: DC

## 2018-10-04 MED ORDER — HYDROCODONE-ACETAMINOPHEN 10-325 MG PO TABS: 1.0000 | ORAL_TABLET | ORAL | 0 refills | Status: DC | PRN

## 2018-10-04 NOTE — Progress Notes (Signed)
[facility]  [address] [address] [place], [place] [address] Phone: [phone]  Fax: [phone]   Telemedicine Office Visit:  10/08/2018  Referring physician: Birdie Sons, MD  I connected with Dan Maker on 10/08/2018 at 4:07 PM by videoconferencing and verified that I was speaking with the correct person using 2 identifiers.  The patient was at home.  I discussed the limitations, risk, security and privacy concerns of performing an evaluation and management service by videoconferencing and the availability of in person appointments.  I also discussed with the patient that there may be a patient responsible charge related to this service.  The patient expressed understanding and agreed to proceed.   Chief Complaint: Michael Small is a 63 y.o. male with metastatic prostate cancerfor assessment priot to initiation of Xtandi  HPI: The patient was last seen in the oncology clinic on 09/24/2018. At that time, he felt "ok". Pain was modestly controlled.  His wife called the clinic on 10/01/2018 stating he was still experiencing pain despite an increase in his Fentanyl patch, Tylenol and Advil, alternating hydrocodone and oxycodone 6x daily, occasionally taking both at the same time. On 10/02/2018,  Fentanyl was increased to 37 mcg/hr with oxycodone and hydrocodone prn.  He was referred to [name], NP for ongoing pain management.   He was seen by Dr. Raynald Blend, MD, cardiology, on 09/26/2018 and 10/03/2018.  Notes reviewed.  EKG at the time showed normal sinus rhythm, left atrial enlargement, and poor R wave progression. Anterior infarct, age undetermined, could not be ruled out.  Stress test showed a fixed anterior apical myocardial perfusion defect c/w previous infarct and/or scar.  Echo showed a mild apical defect c/w previous infarct with ejection fraction of 40 to 45%.  He was felt stable from a cardiac perspective to proceed with enzalutamide.   Plain films of the right femur on 10/05/2018 revealed sclerotic metastatic disease involving the right femur and right hemipelvis without evidence of a pathologic fracture.  Plain films of the left femur revealed extensive sclerotic lesions throughout the left hemipelvis and proximal left femur without evidence of a pathologic fracture.  CBC on 10/05/2018: WBC 7,200 (ANC 5,200), hemoglobin 12.3, hematocrit 36.9, platelets 222,000. Creatinine was 2.30, BUN 33. Alkaline phosphatase was 333.  During the interim, the patient noted no significant improvement in pain with Fentanyl 37 mcg/hr.  When discussing his pain medications and prescriptions documented in Epic, he states that he contacted Dr. Maralyn Sago office.  An Rx for oxycodone 10 mg every 6 hours prn pain (dis #120) and hydrocodone 10/acetaminophen 325 mg 1 po q 3 hours prn pain (dis: #120) was written by Dr. Caryn Section on 10/04/2018.    He states that he was told by [facility] that it was too early to fill his hydrocodone.  They were out of stock for oxycodone.  He then called our office for an Rx.   Past Medical History:  Diagnosis Date  . Chronic kidney disease   . Depression   . GERD (gastroesophageal reflux disease)   . Hyperlipidemia   . Hypertension   . Myocardial infarction [facility])    s/p stent  . Neuromuscular disorder (HCC)    numbness in arms and legs  . Prostate cancer [facility])     Past Surgical History:  Procedure Laterality Date  . CORONARY ANGIOPLASTY WITH STENT PLACEMENT  12/2011   [facility]  . CYSTOSCOPY W/ RETROGRADES Bilateral 05/24/2017  Procedure: CYSTOSCOPY WITH RETROGRADE PYELOGRAM;  Surgeon: [name], MD;  Location: [facility] ORS;  Service: Urology;  Laterality: Bilateral;  . CYSTOSCOPY W/ RETROGRADES Bilateral 11/21/2017   Procedure: CYSTOSCOPY WITH RETROGRADE PYELOGRAM;  Surgeon: [name], MD;  Location: [facility];  Service: Urology;  Laterality: Bilateral;  . CYSTOSCOPY W/  RETROGRADES Bilateral 03/20/2018   Procedure: CYSTOSCOPY WITH RETROGRADE PYELOGRAM;  Surgeon: [name], MD;  Location: [facility] OR[facility];  Service: Urology;  Laterality: Bilateral;  . CYSTOSCOPY W/ URETERAL STENT PLACEMENT Bilateral 11/21/2017   Procedure: CYSTOSCOPY WITH STENT REPLACEMENT;  Surgeon: [name], MD;  Location: [facility];  Service: Urology;  Laterality: Bilateral;  . CYSTOSCOPY W/ URETERAL STENT REMOVAL Bilateral 11/21/2017   Procedure: CYSTOSCOPY WITH STENT REMOVAL;  Surgeon: [name], MD;  Location: [facility];  Service: Urology;  Laterality: Bilateral;  . CYSTOSCOPY WITH STENT PLACEMENT Right 05/24/2017   Procedure: CYSTOSCOPY WITH STENT PLACEMENT;  Surgeon: [name], MD;  Location: [facility];  Service: Urology;  Laterality: Right;  . CYSTOSCOPY WITH STENT PLACEMENT Bilateral 03/20/2018   Procedure: CYSTOSCOPY WITH STENT EXCHANGE;  Surgeon: [name], MD;  Location: [facility];  Service: Urology;  Laterality: Bilateral;  . IR NEPHROSTOGRAM LEFT THRU EXISTING ACCESS  07/21/2017  . IR NEPHROSTOGRAM LEFT THRU EXISTING ACCESS  08/18/2017  . IR NEPHROSTOGRAM RIGHT THRU EXISTING ACCESS  08/18/2017  . IR NEPHROSTOMY PLACEMENT LEFT  06/15/2017  . IR NEPHROSTOMY PLACEMENT RIGHT  06/14/2017  . IR URETERAL STENT PLACEMENT EXISTING ACCESS LEFT  07/21/2017  . PICC LINE INSERTION N/A 08/02/2017   Procedure: PICC LINE INSERTION;  Surgeon: Katha Cabal, MD;  Location: [place] [facility];  Service: Cardiovascular;  Laterality: N/A;  . PROSTATE BIOPSY N/A 05/24/2017   Procedure: PROSTATE BIOPSY;  Surgeon: [name], MD;  Location: [facility];  Service: Urology;  Laterality: N/A;  . TEE WITHOUT CARDIOVERSION N/A 07/31/2017   Procedure: TRANSESOPHAGEAL ECHOCARDIOGRAM (TEE);  Surgeon: Teodoro Spray, MD;  Location: [facility];  Service: Cardiovascular;  Laterality: N/A;  . US ECHOCARDIOGRAPHY  12/2011   Mild LV dysfunction, EF=45%    Family History  Problem Relation Age  of Onset  . Cancer Mother   . Hypertension Mother   . Diabetes Father   . Heart disease Father   . Cancer Paternal Grandfather   . Prostate cancer Neg Hx   . Bladder Cancer Neg Hx   . Kidney cancer Neg Hx     Social History:  reports that he has been smoking cigars. He has a 24.00 pack-year smoking history. He has never used smokeless tobacco. He reports that he does not drink alcohol or use drugs. He has smoked 1 pack per day since age 27 (28 years). He smokes small cigars. He does not drink alcohol, but previously drank heavily. He describes being exposed to Round [place] and asbestos. He has been disabled for several years secondary to "nerve damage and problems in his legs, arms, and bad back". He fell off a ladder. He previously worked for a [employer], [employer]. He lives in [place]. He has step children. The patient is accompanied by his wife today.  Participants in the patient's visit and their role in the encounter included the patient, his wife, and Vito Berger, CMA, today.  The intake visit was provided by Vito Berger, CMA.  Allergies:  Allergies  Allergen Reactions  . Zytiga [Abiraterone] Diarrhea    Current Medications: Current Outpatient Medications  Medication Sig Dispense Refill  .  amLODipine (NORVASC) 10 MG tablet Take 1 tablet (10 mg total) by mouth daily. 90 tablet 3  . diphenoxylate-atropine (LOMOTIL) 2.5-0.025 MG tablet Take 1 tablet by mouth 4 (four) times daily as needed for diarrhea or loose stools. 120 tablet 3  . diphenoxylate-atropine (LOMOTIL) 2.5-0.025 MG tablet Take by mouth.    . enzalutamide (XTANDI) 40 MG capsule Take by mouth.    . fentaNYL (DURAGESIC) 12 MCG/HR Place 1 patch onto the skin every 3 (three) days. 1 patch 0  . fentaNYL (DURAGESIC) 25 MCG/HR Place 1 patch onto the skin every 3 (three) days.    Marland Kitchen HYDROcodone-acetaminophen (NORCO/VICODIN) 5-325 MG tablet Take by mouth.    . Oxycodone HCl 10 MG TABS One tablet every six hours  scheduled, and one every three hours as needed for breakthrough pain 120 tablet 0  . buPROPion (WELLBUTRIN SR) 150 MG 12 hr tablet Take 1 tablet (150 mg total) by mouth 2 (two) times daily. (Patient not taking: Reported on 09/24/2018) 180 tablet 4  . enzalutamide (XTANDI) 40 MG capsule Take 4 capsules (160 mg total) by mouth daily. 120 capsule 0  . fentaNYL (DURAGESIC) 50 MCG/HR Place onto the skin.    Marland Kitchen HYDROcodone-acetaminophen (NORCO) 10-325 MG tablet Take 1 tablet by mouth every 3 (three) hours as needed. For cancer related pain Dx: Dannielle Huh (Patient not taking: Reported on 10/08/2018) 120 tablet 0  . ondansetron (ZOFRAN) 8 MG tablet TAKE 1 TABLET BY MOUTH EVERY 8 HOURS AS NEEDED FOR NAUSEA (Patient not taking: Reported on 09/13/2018) 30 tablet 5   No current facility-administered medications for this visit.      Review of Systems  Constitutional: Negative.  Negative for chills, diaphoresis, fever, malaise/fatigue and weight loss (up 6 pounds since 07/26/2018).       Feels "ok".  HENT: Negative.  Negative for congestion, ear discharge, ear pain, nosebleeds, sinus pain, sore throat and tinnitus.   Eyes: Negative.  Negative for blurred vision, double vision, photophobia and pain.  Respiratory: Negative.  Negative for cough, hemoptysis, sputum production and shortness of breath.   Cardiovascular: Negative.  Negative for chest pain, palpitations, orthopnea and PND.  Gastrointestinal: Negative.  Negative for abdominal pain, blood in stool, constipation, diarrhea, heartburn, melena, nausea and vomiting.  Genitourinary: Negative for dysuria, flank pain, frequency, hematuria and urgency.       Nephrostomy tube exchange in 10/2018.  Musculoskeletal: Negative for falls, joint pain and myalgias.       Bone pain.  Skin: Negative.  Negative for itching and rash.  Neurological: Positive for weakness (generalized). Negative for dizziness, tremors, sensory change, speech change, focal weakness and headaches.   Endo/Heme/Allergies: Negative.  Does not bruise/bleed easily.  Psychiatric/Behavioral: Negative.  Negative for depression and memory loss. The patient is not nervous/anxious and does not have insomnia.     Performance status (ECOG): 2  Physical Exam  Constitutional: He is oriented to person, place, and time.  Chronically fatigued gentleman sitting at home in no acute distress.  HENT:  Head: Normocephalic and atraumatic.  Near alopecia.  Scruffy beard.  Eyes: Conjunctivae and EOM are normal. No scleral icterus.  Neurological: He is alert and oriented to person, place, and time.  Skin: He is not diaphoretic.  Psychiatric: He has a normal mood and affect. His behavior is normal. Judgment and thought content normal.  Nursing note reviewed.    Imaging studies:  04/27/2017:Abdomen and pelvic CT revealed prominent pelvic adenopathy with pathologic retroperitoneal adenopathy, abnormal retroperitoneal stranding, and abnormal inflammatory  type stranding along the pelvic sidewalls and perirectal space. A representative right external iliac lymph node measured 3.2 cm. A left external iliac lymph node measured 2.1 cm. There was conglomerate left periaortic adenopathy 2.1 cm and a 1.4 cm aortocaval node. There was abnormal adenopathy in the perirectal spacewith prominent wall thickening in the rectumand moderate wall thickening in the sigmoid colon. The findings along the pelvic sidewalls appeared to be causingdistal ureteral obstructionresulting in moderatebilateral hydronephrosisand hydroureter. Lymphoma or prostate cancer was favored. A right external iliac node measured 3.2 cm. There was bladder wall thickeningsuggesting cystitis. There was prostatomegaly. 06/22/2017:Bone scanrevealed findings consistent with diffuse metastatic disease. There was activity in right temporal area, sternum, anterior and posterior ribs bilaterally, and entire spine. 06/27/2017:Chest CTrevealed right  infrahilar prominence, progressed from prior CT, worrisome for a node/nodule, but poorly visualized given the lack of IV contrast. There was mid thoracic lymphadenopathy, progressed, including a dominant 14 mm short axis node at the left thoracic inlet. There was stable 19 x 9 mm irregular nodule at the right lung apex. There was a sclerotic osseous metastases at T8-9. 03/09/2018:Bone scanrevealed widespread osseous metastatic disease with super scan appearance, progressive since prior exam. 03/14/2018:Abdomen and pelvicCTrevealed interval progression of widespread sclerotic bony metastatic involvement with new areas of lucency in the axial skeleton and bony pelvis. There was evidence of interval renal atrophy, right greater than left. There was mild persistent right hydronephrosis. There was interval removal of percutaneous nephrostomy catheters with internal ureteral stents visualized bilaterally in situ. There was mild lymphadenopathy in the pelvic sidewall bilaterally. There was diffuse body wall, retroperitoneal, and mesenteric edema. There was persistent circumferential wall thickening in the distal rectum. 06/29/2018: Low dose chest CT revealedLung-RADS 2, benign appearance or behavior.There was stable nodular opacity in the right upper lobewhichlikely reflectedpleural parenchymal scarring.There was improved thoracic lymphadenopathy.There was multifocal/diffuse osseous metastases throughout the visualizedaxial and appendicular skeleton, progressed. 09/11/2018: Abdomen and pelvic CTrevealed grossly stable widespread osseous metastatic disease.There was no new pathologic fracture.There was small retroperitoneal and pelvic lymph nodes,stable. Both ureteral stents appearedadequately positioned. There wasstable right-sided hydronephrosis and hydroureter with chronic renal cortical thinning.There was no evidence of urinary tract calculus.There was interval decreased  generalized soft tissue and perirectal edema.There were no acute findings. 09/11/2018:Bone scanrevealed worsening bony metastatic diseasein theright humerus, left humerus, right side of the calvarium, the left side of the manubrium skin, and in the bilateral femurs.There was persistent metastatic disease in the pelvic bones, sternum, andspine.There wassubtle persistent metastases in the posterior ribs bilaterally. Themetastasis in a medial left posterior rib wasmore prominent.The lack of soft tissue uptakewasconsistent with asuper scandue to the number metastases   No visits with results within 3 Day(s) from this visit.  Latest known visit with results is:  Orders Only on 10/05/2018  Component Date Value Ref Range Status  . Sodium 10/05/2018 137  135 - 145 mmol/L Final  . Potassium 10/05/2018 5.0  3.5 - 5.1 mmol/L Final  . Chloride 10/05/2018 106  98 - 111 mmol/L Final  . CO2 10/05/2018 22  22 - 32 mmol/L Final  . Glucose, Bld 10/05/2018 110* 70 - 99 mg/dL Final  . BUN 10/05/2018 33* 8 - 23 mg/dL Final  . Creatinine, Ser 10/05/2018 2.30* 0.61 - 1.24 mg/dL Final  . Calcium 10/05/2018 9.0  8.9 - 10.3 mg/dL Final  . Total Protein 10/05/2018 7.2  6.5 - 8.1 g/dL Final  . Albumin 10/05/2018 3.7  3.5 - 5.0 g/dL Final  . AST 10/05/2018 14* 15 -  41 U/L Final  . ALT 10/05/2018 10  0 - 44 U/L Final  . Alkaline Phosphatase 10/05/2018 333* 38 - 126 U/L Final  . Total Bilirubin 10/05/2018 0.4  0.3 - 1.2 mg/dL Final  . GFR calc non Af Amer 10/05/2018 29* >60 mL/min Final  . GFR calc Af Amer 10/05/2018 34* >60 mL/min Final  . Anion gap 10/05/2018 9  5 - 15 Final   Performed at [facility], [address] [place], [place] [address]  . WBC 10/05/2018 7.2  4.0 - 10.5 K/uL Final  . RBC 10/05/2018 3.88* 4.22 - 5.81 MIL/uL Final  . Hemoglobin 10/05/2018 12.3* 13.0 - 17.0 g/dL Final  . HCT 10/05/2018 36.9* 39.0 - 52.0 % Final  . MCV 10/05/2018 95.1  80.0 - 100.0 fL Final  . MCH  10/05/2018 31.7  26.0 - 34.0 pg Final  . MCHC 10/05/2018 33.3  30.0 - 36.0 g/dL Final  . RDW 10/05/2018 13.6  11.5 - 15.5 % Final  . Platelets 10/05/2018 222  150 - 400 K/uL Final  . nRBC 10/05/2018 0.0  0.0 - 0.2 % Final  . Neutrophils Relative % 10/05/2018 70  % Final  . Neutro Abs 10/05/2018 5.2  1.7 - 7.7 K/uL Final  . Lymphocytes Relative 10/05/2018 20  % Final  . Lymphs Abs 10/05/2018 1.4  0.7 - 4.0 K/uL Final  . Monocytes Relative 10/05/2018 7  % Final  . Monocytes Absolute 10/05/2018 0.5  0.1 - 1.0 K/uL Final  . Eosinophils Relative 10/05/2018 2  % Final  . Eosinophils Absolute 10/05/2018 0.1  0.0 - 0.5 K/uL Final  . Basophils Relative 10/05/2018 1  % Final  . Basophils Absolute 10/05/2018 0.0  0.0 - 0.1 K/uL Final  . Immature Granulocytes 10/05/2018 0  % Final  . Abs Immature Granulocytes 10/05/2018 0.01  0.00 - 0.07 K/uL Final   Performed at [facility], [address] [place], [place] [address]    Assessment:  KODA ROUTON is a 63 y.o. male with metastaticprostate cancers/p transrectal prostate biopsies and cystoscopy on 05/24/2017. Prostate biopsies revealed acinar adenocarcinoma involving all 12 cores. Adenocarcinoma involved colonic mucosa and skeletal muscle. Gleason score was 9 (4+5), grade group 5. Pathologic stagewas T4N1M1c. PSAwas 2338.8 on 04/24/2017.  Abdomen and pelvic CT on 04/27/2017 revealed prominent pelvic adenopathy with pathologic retroperitoneal adenopathy, abnormal retroperitoneal stranding, and abnormal inflammatory type stranding along the pelvic sidewalls and perirectal space. A representative right external iliac lymph node measured 3.2 cm. A left external iliac lymph node measured 2.1 cm. There was conglomerate left periaortic adenopathy 2.1 cm and a 1.4 cm aortocaval node. There was abnormal adenopathy in the perirectal spacewith prominent wall thickening in the rectumand moderate wall thickening in the sigmoid colon. The  findings along the pelvic sidewalls appeared to be causingdistal ureteral obstructionresulting in moderatebilateral hydronephrosisand hydroureter. Lymphoma or prostate cancer was favored. A right external iliac node measured 3.2 cm. There was bladder wall thickeningsuggesting cystitis. There was prostatomegaly.  Bone scanon 06/22/2017 revealed findings consistent with diffuse metastatic disease. There was activity in right temporal area, sternum, anterior and posterior ribs bilaterally, and entire spine.  He received Lupron 22.5 mg every 3 months (06/01/2017; 10/02/2017; last04/27/2020) and Degarelix240 mg on 06/15/2017. He received Casodexfrom 06/13/2017 - 06/15/2017.   He began abirateroneon 02/28/2018 and discontinued after 2 days secondary to nausea, vomiting, diarrhea, and fatigue. He restarted abiraterone(1 pill/day) on 03/16/2018 and increased to 2 pills/day on 03/23/2018. He increased to 4 tablets in  mid 03/2018. Testosteronewas 98 on 06/14/2017, <3 on 09/07/2017, and <3 on 03/16/2018.  PSAhas been followed: 2338.8 on 04/24/2017, 3603.0 on 06/23/2017, 1035 on 07/26/2017, 241 on 09/07/2017, 342 on 01/23/2018, 107.14 on 04/24/2018, 116.03 on 05/22/2018, 116.52 on 06/27/2018, 101.37 on 07/26/2018, and 180.0 on 08/28/2018.  Abdomen and pelvic CTon 09/11/2018 revealed no acute findings.Bone scanon 09/11/2018 revealed worsening bony metastatic diseasein theright humerus, left humerus, right side of the calvarium, the left side of the manubrium skin, and in the bilateral femurs.There was persistent metastatic disease in the pelvic bones, sternum, andspine.There wassubtle persistent metastases in the posterior ribs bilaterally. Themetastasis in a medial left posterior rib wasmore prominent.The lack of soft tissue uptakewasconsistent with asuper scandue to the number metastases.  He was admitted to [facility] 06/12/2017 - 06/15/2017 with hyperkalemia and  acute renal failure. Creatinine was 7.23. He underwent emergent dialysis x1. He is s/p bilateral external nephrostomy tubeplacement on 06/14/2017(stent exchange 03/20/2018).  He was admitted to [facility] 07/28/2017 - 07/31/2017 with sepsis secondary to UTI with MSSA bacteremia.   Bilateral lower extremity duplexon 06/12/2017 revealed no evidence of DVT. Code statusis DNR/DNI.  Symptomatically, he continues to have bone pain.  Plan: 1.   Review labs from 10/05/2018. 2.   Metastatic prostate cancer Patient has progressive bone metastasis.             PSA correlates with disease progression.  PSA 180.             Plain films of femurs personally reviewed.  No apparent risk for fracture.   Review with radiology. Patient cleared for initiation of enzalutamide by Dr. [name].   Potential side effects re-reviewed.   Discuss plan to initiate on shipment arrival.   Contact Nuala Alpha, pharmacist- done. Continue Lupron every 3 months (last 09/24/2018). 3.Renal insufficiency Creatinine 2.30. Patient s/p bilateral nephrostomy tube placement and exchange. Last exchange noted on 03/20/2018. Next exchange scheduled for 10/2018. 4.Cancer-related pain Patient takingoxycodone10 mg and hydrocodone/acetaminophen 10/325 prn. Fentanyl patch 1mcg/hr ineffective.       Anticipate increasing Fentanyl patch to 50 mcg/hr.  Discuss pain Rxs written by Dr. Caryn Section.  Review pain medications can ONLY be written by one provider.  He will need to follow-up with Dr. Caryn Section regarding medications he has written and pharmacy availability of medications. 5.   RTC in 3 weeks for MD assessment and labs (CBC with diff, CMP, Mg, PSA).  I discussed the assessment and treatment plan with the patient.  The patient was provided an opportunity to ask questions and all were answered.  The patient agreed with the plan and demonstrated an understanding of the  instructions.  The patient was advised to call back or seek an in person evaluation if the symptoms worsen or if the condition fails to improve as anticipated.  I provided 17 minutes (4:07 PM - 4:24 PM) of face-to-face video visit time during this this encounter and > 50% was spent counseling as documented under my assessment and plan.  I provided these services from the [facility] office.   Nolon Stalls, MD, PhD  10/08/2018, 4:07 PM  I, Molly Dorshimer, am acting as Education administrator for [employer]. Mike Gip, MD, PhD.

## 2018-10-05 ENCOUNTER — Encounter: Payer: Self-pay | Admitting: Hematology and Oncology

## 2018-10-05 ENCOUNTER — Other Ambulatory Visit: Payer: Self-pay

## 2018-10-05 ENCOUNTER — Ambulatory Visit
Admission: RE | Admit: 2018-10-05 | Discharge: 2018-10-05 | Disposition: A | Discharge status: Home / Self Care | Payer: Medicare Other | Source: Ambulatory Visit | Attending: Hematology and Oncology | Admitting: Hematology and Oncology

## 2018-10-05 ENCOUNTER — Inpatient Hospital Stay: Payer: Medicare Other | Attending: Nurse Practitioner

## 2018-10-05 DIAGNOSIS — Z79899 Other long term (current) drug therapy: Secondary | ICD-10-CM | POA: Diagnosis not present

## 2018-10-05 DIAGNOSIS — C61 Malignant neoplasm of prostate: Secondary | ICD-10-CM | POA: Diagnosis not present

## 2018-10-05 DIAGNOSIS — Z833 Family history of diabetes mellitus: Secondary | ICD-10-CM | POA: Diagnosis not present

## 2018-10-05 DIAGNOSIS — N189 Chronic kidney disease, unspecified: Secondary | ICD-10-CM | POA: Diagnosis not present

## 2018-10-05 DIAGNOSIS — C7951 Secondary malignant neoplasm of bone: Secondary | ICD-10-CM | POA: Insufficient documentation

## 2018-10-05 DIAGNOSIS — I252 Old myocardial infarction: Secondary | ICD-10-CM | POA: Diagnosis not present

## 2018-10-05 DIAGNOSIS — F17290 Nicotine dependence, other tobacco product, uncomplicated: Secondary | ICD-10-CM | POA: Diagnosis not present

## 2018-10-05 DIAGNOSIS — F17210 Nicotine dependence, cigarettes, uncomplicated: Secondary | ICD-10-CM | POA: Insufficient documentation

## 2018-10-05 DIAGNOSIS — G893 Neoplasm related pain (acute) (chronic): Secondary | ICD-10-CM | POA: Insufficient documentation

## 2018-10-05 DIAGNOSIS — Z809 Family history of malignant neoplasm, unspecified: Secondary | ICD-10-CM | POA: Insufficient documentation

## 2018-10-05 DIAGNOSIS — I129 Hypertensive chronic kidney disease with stage 1 through stage 4 chronic kidney disease, or unspecified chronic kidney disease: Secondary | ICD-10-CM | POA: Insufficient documentation

## 2018-10-05 DIAGNOSIS — Z8249 Family history of ischemic heart disease and other diseases of the circulatory system: Secondary | ICD-10-CM | POA: Insufficient documentation

## 2018-10-05 DIAGNOSIS — N179 Acute kidney failure, unspecified: Secondary | ICD-10-CM | POA: Diagnosis not present

## 2018-10-05 LAB — CBC WITH DIFFERENTIAL/PLATELET
Abs Immature Granulocytes: 0.01 10*3/uL (ref 0.00–0.07)
Basophils Absolute: 0 10*3/uL (ref 0.0–0.1)
Basophils Relative: 1 %
Eosinophils Absolute: 0.1 10*3/uL (ref 0.0–0.5)
Eosinophils Relative: 2 %
HCT: 36.9 % — ABNORMAL LOW (ref 39.0–52.0)
Hemoglobin: 12.3 g/dL — ABNORMAL LOW (ref 13.0–17.0)
Immature Granulocytes: 0 %
Lymphocytes Relative: 20 %
Lymphs Abs: 1.4 10*3/uL (ref 0.7–4.0)
MCH: 31.7 pg (ref 26.0–34.0)
MCHC: 33.3 g/dL (ref 30.0–36.0)
MCV: 95.1 fL (ref 80.0–100.0)
Monocytes Absolute: 0.5 10*3/uL (ref 0.1–1.0)
Monocytes Relative: 7 %
Neutro Abs: 5.2 10*3/uL (ref 1.7–7.7)
Neutrophils Relative %: 70 %
Platelets: 222 10*3/uL (ref 150–400)
RBC: 3.88 MIL/uL — ABNORMAL LOW (ref 4.22–5.81)
RDW: 13.6 % (ref 11.5–15.5)
WBC: 7.2 10*3/uL (ref 4.0–10.5)
nRBC: 0 % (ref 0.0–0.2)

## 2018-10-05 LAB — COMPREHENSIVE METABOLIC PANEL
ALT: 10 U/L (ref 0–44)
AST: 14 U/L — ABNORMAL LOW (ref 15–41)
Albumin: 3.7 g/dL (ref 3.5–5.0)
Alkaline Phosphatase: 333 U/L — ABNORMAL HIGH (ref 38–126)
Anion gap: 9 (ref 5–15)
BUN: 33 mg/dL — ABNORMAL HIGH (ref 8–23)
CO2: 22 mmol/L (ref 22–32)
Calcium: 9 mg/dL (ref 8.9–10.3)
Chloride: 106 mmol/L (ref 98–111)
Creatinine, Ser: 2.3 mg/dL — ABNORMAL HIGH (ref 0.61–1.24)
GFR calc Af Amer: 34 mL/min — ABNORMAL LOW (ref >60)
GFR calc non Af Amer: 29 mL/min — ABNORMAL LOW (ref >60)
Glucose, Bld: 110 mg/dL — ABNORMAL HIGH (ref 70–99)
Potassium: 5 mmol/L (ref 3.5–5.1)
Sodium: 137 mmol/L (ref 135–145)
Total Bilirubin: 0.4 mg/dL (ref 0.3–1.2)
Total Protein: 7.2 g/dL (ref 6.5–8.1)

## 2018-10-08 ENCOUNTER — Other Ambulatory Visit: Payer: Medicare Other

## 2018-10-08 ENCOUNTER — Other Ambulatory Visit: Payer: Self-pay

## 2018-10-08 ENCOUNTER — Inpatient Hospital Stay (HOSPITAL_BASED_OUTPATIENT_CLINIC_OR_DEPARTMENT_OTHER): Payer: Medicare Other | Admitting: Hematology and Oncology

## 2018-10-08 ENCOUNTER — Telehealth: Payer: Self-pay | Admitting: *Deleted

## 2018-10-08 ENCOUNTER — Encounter: Payer: Self-pay | Admitting: Hematology and Oncology

## 2018-10-08 ENCOUNTER — Other Ambulatory Visit: Payer: Self-pay | Admitting: Hematology and Oncology

## 2018-10-08 ENCOUNTER — Telehealth: Payer: Self-pay

## 2018-10-08 DIAGNOSIS — G893 Neoplasm related pain (acute) (chronic): Secondary | ICD-10-CM

## 2018-10-08 DIAGNOSIS — C7951 Secondary malignant neoplasm of bone: Secondary | ICD-10-CM

## 2018-10-08 DIAGNOSIS — C61 Malignant neoplasm of prostate: Secondary | ICD-10-CM

## 2018-10-08 NOTE — Telephone Encounter (Signed)
-----   Message from Lequita Asal, MD sent at 10/08/2018  8:12 AM EDT ----- Regarding: RE: Does patient has a lab appt today?  He needs labs to start enzalutamide per last note.  Let's get him in for labs this morning.  M ----- Message ----- From: Vito Berger, [place] Sent: 10/08/2018   8:10 AM EDT To: Lequita Asal, MD Subject: RE: Does patient has a lab appt today?         He look like he is only schedule for a Doxim[facility] visit today ----- Message ----- From: Lequita Asal, MD Sent: 10/08/2018   4:52 AM EDT To: Arlan Organ, RN, [place]und[place] Ginger, # Subject: Does patient has a lab appt today?

## 2018-10-08 NOTE — Telephone Encounter (Signed)
Patient's wife Parke Simmers called and stated pt could not get hydrocodone 10-325 mg filled [facility]. Parke Simmers is requesting a new script be written and sent to a different pharmacy. Michel Santee that Dr. Caryn Section os out of office until 10/12/2018 and that we would need to send request to another provider. Parke Simmers expressed understanding. Parke Simmers also stated [facility]. Please advise?

## 2018-10-08 NOTE — Progress Notes (Signed)
Patient c/o pain / whole body pain due to pharmacy not having his medication / Oxycodone 10 mg.

## 2018-10-08 NOTE — Telephone Encounter (Signed)
Ms Michael Small called to report that Mr Michael Small has not had pain medication all weekend due to the pharmacy not having it in stock. The patient has recently got a refill from his PCP on 10/04/2018. After reviewing things I spoke with the patient wife and she states he did get a refill and due to the pharmacy not having the medication that's why she called the office. I have informed her that she will need to contact the PCP for the a new script to be sent to a different pharmacy. His wife was understanding and agreeable to call PCP for medication.

## 2018-10-08 NOTE — Telephone Encounter (Signed)
Has this been taken care of?I must have missed this! Do I need to still call him?

## 2018-10-09 ENCOUNTER — Inpatient Hospital Stay: Payer: Medicare Other | Admitting: Nurse Practitioner

## 2018-10-09 ENCOUNTER — Telehealth: Payer: Self-pay | Admitting: Pharmacist

## 2018-10-09 DIAGNOSIS — C61 Malignant neoplasm of prostate: Secondary | ICD-10-CM

## 2018-10-09 DIAGNOSIS — C7951 Secondary malignant neoplasm of bone: Secondary | ICD-10-CM

## 2018-10-09 NOTE — Telephone Encounter (Signed)
[facility]

## 2018-10-09 NOTE — Telephone Encounter (Signed)
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  Counseled patient on administration, dosing, side effects, monitoring, drug-food interactions, safe handling, storage, and disposal. Patient will take 4 capsules (160 mg total) by mouth daily.  Side effects include but not limited to: fatigue, HTN, peripheral edema.    Reviewed with patient importance of keeping a medication schedule and plan for any missed doses.  Michael Small voiced understanding and appreciation. All questions answered. Medication handout placed in the mail.  Provided patient with [facility] phone number. Patient knows to call the office with questions or concerns. [facility] will continue to follow.  Darl Pikes, PharmD, BCPS, [facility] Hematology/Oncology Clinical Pharmacist [facility] [phone]  10/09/2018 3:54 PM

## 2018-10-09 NOTE — Telephone Encounter (Signed)
Please review for Dr. Caryn Section  Thanks,   -Mickel Baas

## 2018-10-10 MED FILL — XTANDI 40 MG CAPSULE: 40 | 30 days supply | Qty: 120 | Fill #0

## 2018-10-15 ENCOUNTER — Other Ambulatory Visit: Payer: Self-pay

## 2018-10-15 ENCOUNTER — Encounter: Payer: Self-pay | Admitting: Family Medicine

## 2018-10-15 ENCOUNTER — Ambulatory Visit (INDEPENDENT_AMBULATORY_CARE_PROVIDER_SITE_OTHER): Payer: Medicare Other | Admitting: Family Medicine

## 2018-10-15 VITALS — BP 142/70 | HR 68 | Temp 98.5°F | Resp 16 | Ht 71.0 in | Wt 163.0 lb

## 2018-10-15 DIAGNOSIS — I10 Essential (primary) hypertension: Secondary | ICD-10-CM | POA: Diagnosis not present

## 2018-10-15 NOTE — Progress Notes (Signed)
Patient: Michael Small Male    DOB: 10-12-1955   63 y.o.   MRN: [id number] Visit Date: 10/15/2018  Today's Provider: [name], MD   Chief Complaint  Patient presents with  . Hypertension   Subjective:     HPI  Hypertension, follow-up:  BP Readings from Last 3 Encounters:  10/15/18 (!) 142/70  10/05/18 (!) 175/76  09/24/18 132/78    He was last seen for hypertension 3 months ago.  BP at that visit was 172/90. Management since that visit includes no changes. Patient had run out of blood pressure medication prior to last office visit.  He reports good compliance with treatment. He is not having side effects.  He is not exercising. He is adherent to low salt diet.   Outside blood pressures are checked occasionally. Patient reports that it averages in the 140s/70s.  He is experiencing none.  Patient denies exertional chest pressure/discomfort, irregular heart beat and lower extremity edema.   Cardiovascular risk factors include dyslipidemia and smoking/ tobacco exposure.   Weight trend: stable Wt Readings from Last 3 Encounters:  10/15/18 163 lb (73.9 kg)  09/24/18 162 lb 11.2 oz (73.8 kg)  07/26/18 156 lb 4.9 oz (70.9 kg)    Current diet: on average, 2 meals per day   Allergies  Allergen Reactions  . Zytiga [Abiraterone] Diarrhea     Current Outpatient Medications:  .  amLODipine (NORVASC) 10 MG tablet, Take 1 tablet (10 mg total) by mouth daily., Disp: 90 tablet, Rfl: 3 .  buPROPion (WELLBUTRIN SR) 150 MG 12 hr tablet, Take 1 tablet (150 mg total) by mouth 2 (two) times daily., Disp: 180 tablet, Rfl: 4 .  diphenoxylate-atropine (LOMOTIL) 2.5-0.025 MG tablet, Take 1 tablet by mouth 4 (four) times daily as needed for diarrhea or loose stools., Disp: 120 tablet, Rfl: 3 .  diphenoxylate-atropine (LOMOTIL) 2.5-0.025 MG tablet, Take by mouth., Disp: , Rfl:  .  enzalutamide (XTANDI) 40 MG capsule, Take 4 capsules (160 mg total) by mouth daily., Disp: 120  capsule, Rfl: 0 .  HYDROcodone-acetaminophen (NORCO) 10-325 MG tablet, Take 1 tablet by mouth every 3 (three) hours as needed. For cancer related pain Dx: C61, Disp: 120 tablet, Rfl: 0 .  HYDROcodone-acetaminophen (NORCO/VICODIN) 5-325 MG tablet, Take by mouth., Disp: , Rfl:  .  Oxycodone HCl 10 MG TABS, One tablet every six hours scheduled, and one every three hours as needed for breakthrough pain, Disp: 120 tablet, Rfl: 0 .  fentaNYL (DURAGESIC) 12 MCG/HR, Place 1 patch onto the skin every 3 (three) days. (Patient not taking: Reported on 10/15/2018), Disp: 1 patch, Rfl: 0 .  fentaNYL (DURAGESIC) 25 MCG/HR, Place 1 patch onto the skin every 3 (three) days., Disp: , Rfl:  .  fentaNYL (DURAGESIC) 50 MCG/HR, Place onto the skin., Disp: , Rfl:  .  ondansetron (ZOFRAN) 8 MG tablet, TAKE 1 TABLET BY MOUTH EVERY 8 HOURS AS NEEDED FOR NAUSEA (Patient not taking: Reported on 09/13/2018), Disp: 30 tablet, Rfl: 5  Review of Systems  Constitutional: Negative for activity change, appetite change, chills, diaphoresis, fatigue, fever and unexpected weight change.  Respiratory: Negative for cough and shortness of breath.   Cardiovascular: Negative for chest pain, palpitations and leg swelling.  Allergic/Immunologic: Negative for environmental allergies.  Neurological: Negative for dizziness, light-headedness and headaches.    Social History   Tobacco Use  . Smoking status: Current Every Day Smoker    Packs/day: 0.50    Years: 48.00  Pack years: 24.00    Types: Cigars  . Smokeless tobacco: Never Used  . Tobacco comment: used to smoke 2PPD- lately cut down to 1/2 PPD (cigars)  Substance Use Topics  . 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      Objective:   BP (!) 142/70 (BP Location: Left Arm, Patient Position: Sitting, Cuff Size: Normal)   Pulse 68   Temp 98.5 F (36.9 C)   Resp 16   Ht 5\' 11"  (1.803 m)   Wt 163  lb (73.9 kg)   SpO2 100%   BMI 22.73 kg/m  Vitals:   10/15/18 0936  BP: (!) 142/70  Pulse: 68  Resp: 16  Temp: 98.5 F (36.9 C)  SpO2: 100%  Weight: 163 lb (73.9 kg)  Height: 5\' 11"  (1.803 m)     Physical Exam   General Appearance:    Alert, cooperative, no distress  Eyes:    PERRL, conjunctiva/corneas clear, EOM's intact       Lungs:     Clear to auscultation bilaterally, respirations unlabored  Heart:    Regular rate and rhythm  Neurologic:   Awake, alert, oriented x 3. No apparent focal neurological           defect.           Assessment & Plan    1. Essential hypertension Much better since taking amlodipine consistently.   The entirety of the information documented in the History of Present Illness, Review of Systems and Physical Exam were personally obtained by me. Portions of this information were initially documented by Wilburt Finlay, CMA and reviewed by me for thoroughness and accuracy.      [name], MD  [facility]

## 2018-10-15 NOTE — Patient Instructions (Signed)
.   Please review the attached list of medications and notify my office if there are any errors.   . Please bring all of your medications to every appointment so we can make sure that our medication list is the same as yours.   

## 2018-10-24 ENCOUNTER — Other Ambulatory Visit: Payer: Self-pay

## 2018-10-24 DIAGNOSIS — C61 Malignant neoplasm of prostate: Secondary | ICD-10-CM

## 2018-10-26 ENCOUNTER — Other Ambulatory Visit: Payer: Self-pay

## 2018-10-26 ENCOUNTER — Inpatient Hospital Stay: Payer: Medicare Other

## 2018-10-26 DIAGNOSIS — I129 Hypertensive chronic kidney disease with stage 1 through stage 4 chronic kidney disease, or unspecified chronic kidney disease: Secondary | ICD-10-CM | POA: Diagnosis not present

## 2018-10-26 DIAGNOSIS — C61 Malignant neoplasm of prostate: Secondary | ICD-10-CM | POA: Diagnosis not present

## 2018-10-26 DIAGNOSIS — G893 Neoplasm related pain (acute) (chronic): Secondary | ICD-10-CM | POA: Diagnosis not present

## 2018-10-26 DIAGNOSIS — N179 Acute kidney failure, unspecified: Secondary | ICD-10-CM | POA: Diagnosis not present

## 2018-10-26 DIAGNOSIS — N189 Chronic kidney disease, unspecified: Secondary | ICD-10-CM | POA: Diagnosis not present

## 2018-10-26 DIAGNOSIS — C7951 Secondary malignant neoplasm of bone: Secondary | ICD-10-CM | POA: Diagnosis not present

## 2018-10-26 LAB — COMPREHENSIVE METABOLIC PANEL
ALT: 12 U/L (ref 0–44)
AST: 16 U/L (ref 15–41)
Albumin: 4 g/dL (ref 3.5–5.0)
Alkaline Phosphatase: 379 U/L — ABNORMAL HIGH (ref 38–126)
Anion gap: 9 (ref 5–15)
BUN: 33 mg/dL — ABNORMAL HIGH (ref 8–23)
CO2: 23 mmol/L (ref 22–32)
Calcium: 9.2 mg/dL (ref 8.9–10.3)
Chloride: 106 mmol/L (ref 98–111)
Creatinine, Ser: 2.51 mg/dL — ABNORMAL HIGH (ref 0.61–1.24)
GFR calc Af Amer: 30 mL/min — ABNORMAL LOW (ref >60)
GFR calc non Af Amer: 26 mL/min — ABNORMAL LOW (ref >60)
Glucose, Bld: 101 mg/dL — ABNORMAL HIGH (ref 70–99)
Potassium: 5 mmol/L (ref 3.5–5.1)
Sodium: 138 mmol/L (ref 135–145)
Total Bilirubin: 0.4 mg/dL (ref 0.3–1.2)
Total Protein: 7.4 g/dL (ref 6.5–8.1)

## 2018-10-26 LAB — CBC WITH DIFFERENTIAL/PLATELET
Abs Immature Granulocytes: 0.02 10*3/uL (ref 0.00–0.07)
Basophils Absolute: 0.1 10*3/uL (ref 0.0–0.1)
Basophils Relative: 1 %
Eosinophils Absolute: 0.2 10*3/uL (ref 0.0–0.5)
Eosinophils Relative: 4 %
HCT: 38.2 % — ABNORMAL LOW (ref 39.0–52.0)
Hemoglobin: 12.8 g/dL — ABNORMAL LOW (ref 13.0–17.0)
Immature Granulocytes: 0 %
Lymphocytes Relative: 36 %
Lymphs Abs: 2 10*3/uL (ref 0.7–4.0)
MCH: 31.6 pg (ref 26.0–34.0)
MCHC: 33.5 g/dL (ref 30.0–36.0)
MCV: 94.3 fL (ref 80.0–100.0)
Monocytes Absolute: 0.4 10*3/uL (ref 0.1–1.0)
Monocytes Relative: 8 %
Neutro Abs: 2.8 10*3/uL (ref 1.7–7.7)
Neutrophils Relative %: 51 %
Platelets: 250 10*3/uL (ref 150–400)
RBC: 4.05 MIL/uL — ABNORMAL LOW (ref 4.22–5.81)
RDW: 13.7 % (ref 11.5–15.5)
WBC: 5.4 10*3/uL (ref 4.0–10.5)
nRBC: 0 % (ref 0.0–0.2)

## 2018-10-26 LAB — PSA: Prostatic Specific Antigen: 113.85 ng/mL — ABNORMAL HIGH (ref 0.00–4.00)

## 2018-10-26 LAB — MAGNESIUM: Magnesium: 2.2 mg/dL (ref 1.7–2.4)

## 2018-10-26 NOTE — Progress Notes (Signed)
Michael Small  [address] [place], [place] [address] Phone: [phone]  Fax: [phone]   Telemedicine Office Visit:  10/29/2018  Referring physician: Birdie Sons, MD  I connected with Michael Small on 10/29/2018 at 11:14 AM by videoconferencing and verified that I was speaking with the correct person using 2 identifiers.  The patient was at home.  I discussed the limitations, risk, security and privacy concerns of performing an evaluation and management service by videoconferencing and the availability of in person appointments.  I also discussed with the patient that there may be a patient responsible charge related to this service.  The patient expressed understanding and agreed to proceed.   Chief Complaint: Michael Small is a 63 y.o. male with metastatic prostate cancerfor assessment on Xtandi  HPI: The patient was last seen in the oncology clinic on 10/08/2018 via telemedicine. At that time, he continued to have bone pain. He was cleared to begin enzalutamide by Dr Michael Small.  He began enzalutamide 3 weeks ago on approximately 10/08/2018.   He saw Dr. Lelon Small on 10/15/2018 and continued on amlodipine for essential hypertension.   Labs on 10/26/2018: WBC 5,400, hemoglobin 12.8, hematocrit 38.2, platelets 250,000. Creatinine 2.51, BUN 33. Alkaline phosphatase 379. Magnesium 2.2. PSA 113.85. Testosterone < 3.   During the interim, the patient is doing moderately well. He is tolerating Xtandi well. His wife notes pain in his bilateral legs and involuntary movements. He denies any numbness and is able to walk. He is fatigued.   He started [place] approximately 3 weeks ago, on 10/08/2018, with 2 pills daily switching to 3 pills daily during the first week. He has been at the full dose, 4 pills daily, for 2 weeks.  He denies any side effects associated with Xtandi.  His bone pain is managed with hydrocodone, 10mg  4x daily, and oxycodone 10mg ,  4x daily.  He takes a total of 8 pain pills/day.  On a good day, his pain is 7-8 out of 10. The pain wakes him up at night.   He is hesitant, but willing, to meet with Michael Harm, NP for management of his pain.    Past Medical History:  Diagnosis Date   Chronic kidney disease    Depression    GERD (gastroesophageal reflux disease)    Hyperlipidemia    Hypertension    Myocardial infarction [facility])    s/p stent   Neuromuscular disorder (HCC)    numbness in arms and legs   Prostate cancer [facility])     Past Surgical History:  Procedure Laterality Date   CORONARY ANGIOPLASTY WITH STENT PLACEMENT  12/2011   [facility]   CYSTOSCOPY W/ RETROGRADES Bilateral 05/24/2017   Procedure: CYSTOSCOPY WITH RETROGRADE PYELOGRAM;  Surgeon: [name], MD;  Location: [facility] ORS;  Service: Urology;  Laterality: Bilateral;   CYSTOSCOPY W/ RETROGRADES Bilateral 11/21/2017   Procedure: CYSTOSCOPY WITH RETROGRADE PYELOGRAM;  Surgeon: [name], MD;  Location: [facility];  Service: Urology;  Laterality: Bilateral;   CYSTOSCOPY W/ RETROGRADES Bilateral 03/20/2018   Procedure: CYSTOSCOPY WITH RETROGRADE PYELOGRAM;  Surgeon: [name], MD;  Location: [facility];  Service: Urology;  Laterality: Bilateral;   CYSTOSCOPY W/ URETERAL STENT PLACEMENT Bilateral 11/21/2017   Procedure: CYSTOSCOPY WITH STENT REPLACEMENT;  Surgeon: [name], MD;  Location: [facility];  Service: Urology;  Laterality: Bilateral;   CYSTOSCOPY W/ URETERAL STENT REMOVAL Bilateral 11/21/2017   Procedure: CYSTOSCOPY WITH STENT REMOVAL;  Surgeon: [name], MD;  Location: [facility];  Service: Urology;  Laterality: Bilateral;   CYSTOSCOPY WITH STENT PLACEMENT Right 05/24/2017   Procedure: CYSTOSCOPY WITH STENT PLACEMENT;  Surgeon: [name], MD;  Location: [facility];  Service: Urology;  Laterality: Right;   CYSTOSCOPY WITH STENT PLACEMENT Bilateral 03/20/2018   Procedure: CYSTOSCOPY WITH  STENT EXCHANGE;  Surgeon: [name], MD;  Location: [facility];  Service: Urology;  Laterality: Bilateral;   IR NEPHROSTOGRAM LEFT THRU EXISTING ACCESS  07/21/2017   IR NEPHROSTOGRAM LEFT THRU EXISTING ACCESS  08/18/2017   IR NEPHROSTOGRAM RIGHT THRU EXISTING ACCESS  08/18/2017   IR NEPHROSTOMY PLACEMENT LEFT  06/15/2017   IR NEPHROSTOMY PLACEMENT RIGHT  06/14/2017   IR URETERAL STENT PLACEMENT EXISTING ACCESS LEFT  07/21/2017   PICC LINE INSERTION N/A 08/02/2017   Procedure: PICC LINE INSERTION;  Surgeon: Katha Cabal, MD;  Location: [facility];  Service: Cardiovascular;  Laterality: N/A;   PROSTATE BIOPSY N/A 05/24/2017   Procedure: PROSTATE BIOPSY;  Surgeon: [name], MD;  Location: [facility];  Service: Urology;  Laterality: N/A;   TEE WITHOUT CARDIOVERSION N/A 07/31/2017   Procedure: TRANSESOPHAGEAL ECHOCARDIOGRAM (TEE);  Surgeon: Teodoro Spray, MD;  Location: [facility];  Service: Cardiovascular;  Laterality: N/A;   US ECHOCARDIOGRAPHY  12/2011   Mild LV dysfunction, EF=45%    Family History  Problem Relation Age of Onset   Cancer Mother    Hypertension Mother    Diabetes Father    Heart disease Father    Cancer Paternal Grandfather    Prostate cancer Neg Hx    Bladder Cancer Neg Hx    Kidney cancer Neg Hx     Social History:  reports that he has been smoking cigars. He has a 24.00 pack-year smoking history. He has never used smokeless tobacco. He reports that he does not drink alcohol or use drugs. He has smoked 1 pack per day since age 64 (49 years). He smokes small cigars. He does not drink alcohol, but previously drank heavily. He describes being exposed to [place] and asbestos. He has been disabled for several years secondary to "nerve damage and problems in his legs, arms, and bad back". He fell off a ladder. He previously worked for a [employer], [employer]. He lives in [place]. He has step children.The patient is accompanied by  his wife today.  Participants in the patient's visit and their role in the encounter included the patient, his wife, and Vito Berger, CMA, today.  The intake visit was provided by Vito Berger, CMA.  Allergies:  Allergies  Allergen Reactions   Zytiga [Abiraterone] Diarrhea    Current Medications: Current Outpatient Medications  Medication Sig Dispense Refill   amLODipine (NORVASC) 10 MG tablet Take 1 tablet (10 mg total) by mouth daily. 90 tablet 3   buPROPion (WELLBUTRIN SR) 150 MG 12 hr tablet Take 1 tablet (150 mg total) by mouth 2 (two) times daily. 180 tablet 4   enzalutamide (XTANDI) 40 MG capsule Take 4 capsules (160 mg total) by mouth daily. 120 capsule 0   HYDROcodone-acetaminophen (NORCO) 10-325 MG tablet Take 1 tablet by mouth every 3 (three) hours as needed. For cancer related pain Dx: C61 120 tablet 0   Oxycodone HCl 10 MG TABS One tablet every six hours scheduled, and one every three hours as needed for breakthrough pain 120 tablet 0   diphenoxylate-atropine (LOMOTIL) 2.5-0.025 MG tablet Take 1 tablet by mouth 4 (four) times  daily as needed for diarrhea or loose stools. (Patient not taking: Reported on 10/29/2018) 120 tablet 3   diphenoxylate-atropine (LOMOTIL) 2.5-0.025 MG tablet Take by mouth.     fentaNYL (DURAGESIC) 12 MCG/HR Place 1 patch onto the skin every 3 (three) days. (Patient not taking: Reported on 10/15/2018) 1 patch 0   fentaNYL (DURAGESIC) 25 MCG/HR Place 1 patch onto the skin every 3 (three) days.     fentaNYL (DURAGESIC) 50 MCG/HR Place onto the skin.     ondansetron (ZOFRAN) 8 MG tablet TAKE 1 TABLET BY MOUTH EVERY 8 HOURS AS NEEDED FOR NAUSEA (Patient not taking: Reported on 09/13/2018) 30 tablet 5   No current facility-administered medications for this visit.     Review of Systems  Constitutional: Positive for malaise/fatigue (chronic). Negative for chills, diaphoresis, fever and weight loss.  HENT: Negative.  Negative for  congestion, ear discharge, ear pain, nosebleeds, sinus pain, sore throat and tinnitus.   Eyes: Negative.  Negative for blurred vision, double vision, photophobia and pain.  Respiratory: Negative.  Negative for cough, hemoptysis, sputum production and shortness of breath.   Cardiovascular: Negative.  Negative for chest pain, palpitations, orthopnea and PND.  Gastrointestinal: Negative.  Negative for abdominal pain, blood in stool, constipation, diarrhea, heartburn, melena, nausea and vomiting.  Genitourinary: Negative for dysuria, flank pain, frequency, hematuria and urgency.       Nephrostomy tube exchange in 10/2018.  Musculoskeletal: Positive for joint pain (bone pain, leg pain). Negative for falls and myalgias.  Skin: Negative.  Negative for itching and rash.  Neurological: Positive for tremors (involunatry movements in bilateral legs) and weakness (generalized). Negative for dizziness, sensory change, speech change, focal weakness and headaches.  Endo/Heme/Allergies: Negative.  Does not bruise/bleed easily.  Psychiatric/Behavioral: Negative.  Negative for depression and memory loss. The patient is not nervous/anxious and does not have insomnia.   All other systems reviewed and are negative.   Performance status (ECOG): 2-3  Physical Exam  Constitutional: He is oriented to person, place, and time.  Chronically fatigued gentleman sitting at home in no acute distress.  HENT:  Head: Normocephalic and atraumatic.  Near alopecia.  Graying light brown beard.  Eyes: Conjunctivae and EOM are normal. No scleral icterus.  Neurological: He is alert and oriented to person, place, and time.  Skin: He is not diaphoretic.  Psychiatric: He has a normal mood and affect. His behavior is normal. Judgment and thought content normal.  Nursing note reviewed.   Imaging studies:  04/27/2017:Abdomen and pelvic CT revealed prominent pelvic adenopathy with pathologic retroperitoneal adenopathy, abnormal  retroperitoneal stranding, and abnormal inflammatory type stranding along the pelvic sidewalls and perirectal space. A representative right external iliac lymph node measured 3.2 cm. A left external iliac lymph node measured 2.1 cm. There was conglomerate left periaortic adenopathy 2.1 cm and a 1.4 cm aortocaval node. There was abnormal adenopathy in the perirectal spacewith prominent wall thickening in the rectumand moderate wall thickening in the sigmoid colon. The findings along the pelvic sidewalls appeared to be causingdistal ureteral obstructionresulting in moderatebilateral hydronephrosisand hydroureter. Lymphoma or prostate cancer was favored. A right external iliac node measured 3.2 cm. There was bladder wall thickeningsuggesting cystitis. There was prostatomegaly. 06/22/2017:Bone scanrevealed findings consistent with diffuse metastatic disease. There was activity in right temporal area, sternum, anterior and posterior ribs bilaterally, and entire spine. 06/27/2017:Chest CTrevealed right infrahilar prominence, progressed from prior CT, worrisome for a node/nodule, but poorly visualized given the lack of IV contrast. There was mid thoracic lymphadenopathy, progressed,  including a dominant 14 mm short axis node at the left thoracic inlet. There was stable 19 x 9 mm irregular nodule at the right lung apex. There was a sclerotic osseous metastases at T8-9. 03/09/2018:Bone scanrevealed widespread osseous metastatic disease with super scan appearance, progressive since prior exam. 03/14/2018:Abdomen and pelvicCTrevealed interval progression of widespread sclerotic bony metastatic involvement with new areas of lucency in the axial skeleton and bony pelvis. There was evidence of interval renal atrophy, right greater than left. There was mild persistent right hydronephrosis. There was interval removal of percutaneous nephrostomy catheters with internal ureteral stents visualized  bilaterally in situ. There was mild lymphadenopathy in the pelvic sidewall bilaterally. There was diffuse body wall, retroperitoneal, and mesenteric edema. There was persistent circumferential wall thickening in the distal rectum. 06/29/2018: Low dose chest CT revealedLung-RADS 2, benign appearance or behavior.There was stable nodular opacity in the right upper lobewhichlikely reflectedpleural parenchymal scarring.There was improved thoracic lymphadenopathy.There was multifocal/diffuse osseous metastases throughout the visualizedaxial and appendicular skeleton, progressed. 09/11/2018: Abdomen and pelvic CTrevealed grossly stable widespread osseous metastatic disease.There was no new pathologic fracture.There was small retroperitoneal and pelvic lymph nodes,stable. Both ureteral stents appearedadequately positioned. There wasstable right-sided hydronephrosis and hydroureter with chronic renal cortical thinning.There was no evidence of urinary tract calculus.There was interval decreased generalized soft tissue and perirectal edema.There were no acute findings. 09/11/2018:Bone scanrevealed worsening bony metastatic diseasein theright humerus, left humerus, right side of the calvarium, the left side of the manubrium skin, and in the bilateral femurs.There was persistent metastatic disease in the pelvic bones, sternum, andspine.There wassubtle persistent metastases in the posterior ribs bilaterally. Themetastasis in a medial left posterior rib wasmore prominent.The lack of soft tissue uptakewasconsistent with asuper scandue to the number metastases   No visits with results within 3 Day(s) from this visit.  Latest known visit with results is:  Orders Only on 10/26/2018  Component Date Value Ref Range Status   WBC 10/26/2018 5.4  4.0 - 10.5 K/uL Final   RBC 10/26/2018 4.05* 4.22 - 5.81 MIL/uL Final   Hemoglobin 10/26/2018 12.8* 13.0 - 17.0 g/dL Final   HCT  10/26/2018 38.2* 39.0 - 52.0 % Final   MCV 10/26/2018 94.3  80.0 - 100.0 fL Final   MCH 10/26/2018 31.6  26.0 - 34.0 pg Final   MCHC 10/26/2018 33.5  30.0 - 36.0 g/dL Final   RDW 10/26/2018 13.7  11.5 - 15.5 % Final   Platelets 10/26/2018 250  150 - 400 K/uL Final   nRBC 10/26/2018 0.0  0.0 - 0.2 % Final   Neutrophils Relative % 10/26/2018 51  % Final   Neutro Abs 10/26/2018 2.8  1.7 - 7.7 K/uL Final   Lymphocytes Relative 10/26/2018 36  % Final   Lymphs Abs 10/26/2018 2.0  0.7 - 4.0 K/uL Final   Monocytes Relative 10/26/2018 8  % Final   Monocytes Absolute 10/26/2018 0.4  0.1 - 1.0 K/uL Final   Eosinophils Relative 10/26/2018 4  % Final   Eosinophils Absolute 10/26/2018 0.2  0.0 - 0.5 K/uL Final   Basophils Relative 10/26/2018 1  % Final   Basophils Absolute 10/26/2018 0.1  0.0 - 0.1 K/uL Final   Immature Granulocytes 10/26/2018 0  % Final   Abs Immature Granulocytes 10/26/2018 0.02  0.00 - 0.07 K/uL Final   Performed at [facility], [place], [place] [address]   Sodium 10/26/2018 138  135 - 145 mmol/L Final   Potassium 10/26/2018 5.0  3.5 - 5.1 mmol/L Final  Chloride 10/26/2018 106  98 - 111 mmol/L Final   CO2 10/26/2018 23  22 - 32 mmol/L Final   Glucose, Bld 10/26/2018 101* 70 - 99 mg/dL Final   BUN 10/26/2018 33* 8 - 23 mg/dL Final   Creatinine, Ser 10/26/2018 2.51* 0.61 - 1.24 mg/dL Final   Calcium 10/26/2018 9.2  8.9 - 10.3 mg/dL Final   Total Protein 10/26/2018 7.4  6.5 - 8.1 g/dL Final   Albumin 10/26/2018 4.0  3.5 - 5.0 g/dL Final   AST 10/26/2018 16  15 - 41 U/L Final   ALT 10/26/2018 12  0 - 44 U/L Final   Alkaline Phosphatase 10/26/2018 379* 38 - 126 U/L Final   Total Bilirubin 10/26/2018 0.4  0.3 - 1.2 mg/dL Final   GFR calc non Af Amer 10/26/2018 26* >60 mL/min Final   GFR calc Af Amer 10/26/2018 30* >60 mL/min Final   Anion gap 10/26/2018 9  5 - 15 Final   Performed at [facility], [address] [place], [place] [address]   Magnesium 10/26/2018 2.2  1.7 - 2.4 mg/dL Final   Performed at [facility], [place], [place] [address]   Prostatic Specific Antigen 10/26/2018 113.85* 0.00 - 4.00 ng/mL Final   Comment: (NOTE) While PSA levels of <=4.0 ng/ml are reported as reference range, some men with levels below 4.0 ng/ml can have prostate cancer and many men with PSA above 4.0 ng/ml do not have prostate cancer.  Other tests such as free PSA, age specific reference ranges, PSA velocity and PSA doubling time may be helpful especially in men less than 75 years old. Performed at [facility], [place] [address], [place], [place] [address]    Testosterone 10/26/2018 <3* 264 - 916 ng/dL Final   Comment: (NOTE) Adult male reference interval is based on a population of healthy nonobese males (BMI <30) between 30 and 50 years old. [place], [place] [id number]. PMID: [id number]. Performed At: [facility] [address] [place], [place] [id number] Rush Farmer MD DV:[id number]     Assessment:  IAM LIPSON is a 63 y.o. male with metastaticprostate cancers/p transrectal prostate biopsies and cystoscopy on 05/24/2017. Prostate biopsies revealed acinar adenocarcinoma involving all 12 cores. Adenocarcinoma involved colonic mucosa and skeletal muscle. Gleason score was 9 (4+5), grade group 5. Pathologic stagewas [id number]. PSAwas 2338.8 on 04/24/2017.  Abdomen and pelvic CT on 04/27/2017 revealed prominent pelvic adenopathy with pathologic retroperitoneal adenopathy, abnormal retroperitoneal stranding, and abnormal inflammatory type stranding along the pelvic sidewalls and perirectal space. A representative right external iliac lymph node measured 3.2 cm. A left external iliac lymph node measured 2.1 cm. There was conglomerate left periaortic adenopathy 2.1 cm and a 1.4 cm aortocaval node. There was abnormal adenopathy in the perirectal  spacewith prominent wall thickening in the rectumand moderate wall thickening in the sigmoid colon. The findings along the pelvic sidewalls appeared to be causingdistal ureteral obstructionresulting in moderatebilateral hydronephrosisand hydroureter. Lymphoma or prostate cancer was favored. A right external iliac node measured 3.2 cm. There was bladder wall thickeningsuggesting cystitis. There was prostatomegaly.  Bone scanon 06/22/2017 revealed findings consistent with diffuse metastatic disease. There was activity in right temporal area, sternum, anterior and posterior ribs bilaterally, and entire spine.  He received Lupron 22.5 mg every 3 months (06/01/2017; 10/02/2017; last04/27/2020) and Degarelix240 mg on 06/15/2017. He received Casodexfrom 06/13/2017 - 06/15/2017.   He began abirateroneon 02/28/2018 and discontinued after 2 days secondary to nausea, vomiting, diarrhea, and  fatigue. He restarted abiraterone(1 pill/day) on 03/16/2018 and increased to 2 pills/day on 03/23/2018. He increased to 4 tablets in mid 03/2018. He began enzalutamide on 10/08/2018.  He has been on full dose enzalutamide since approximately 10/17/2018.  Testosteronewas 98 on 06/14/2017, <3 on 09/07/2017, and <3 on 03/16/2018.  PSAhas been followed: 2338.8 on 04/24/2017, 3603.0 on 06/23/2017, 1035 on 07/26/2017, 241 on 09/07/2017, 342 on 01/23/2018, 107.14 on 04/24/2018, 116.03 on 05/22/2018, 116.52 on 06/27/2018, 101.37 on 07/26/2018, 180.0 on 08/28/2018, and 113.85 on 10/26/2018.  Abdomen and pelvic CTon 09/11/2018 revealed no acute findings.Bone scanon 09/11/2018 revealed worsening bony metastatic diseasein theright humerus, left humerus, right side of the calvarium, the left side of the manubrium skin, and in the bilateral femurs.There was persistent metastatic disease in the pelvic bones, sternum, andspine.There wassubtle persistent metastases in the posterior ribs bilaterally.  Themetastasis in a medial left posterior rib wasmore prominent.The lack of soft tissue uptakewasconsistent with asuper scandue to the number metastases.  He was admitted to [facility] 06/12/2017 - 06/15/2017 with hyperkalemia and acute renal failure. Creatinine was 7.23. He underwent emergent dialysis x1. He is s/p bilateral external nephrostomy tubeplacement on 06/14/2017(stent exchange 03/20/2018).  He was admitted to [facility] 07/28/2017 - 07/31/2017 with sepsis secondary to UTI with MSSA bacteremia.   Bilateral lower extremity duplexon 06/12/2017 revealed no evidence of DVT. Code statusis DNR/DNI.  Symptomatically, he is tolerating Xtandi well.  He has been on full dose Xtandi for approximately 2 weeks.  PSA is 113.85 (improved).  Plan: 1.   Review labs from 10/26/2018. 2.   Metastatic prostate cancer Patient has extensive bone metastasis. He began enzatutamide on 10/08/2018 (full dose 10/17/2018).  PSA has correlated with disease status.     PSA was 180 on 08/28/2018.   PSA was 113.85 on 10/26/2018.  There is early suggestion of disease response Continue Lupron every 3 months (last 09/24/2018). 3.Renal insufficiency Creatinine 2.51. He is scheduled for bilateral stent exchange on 11/14/2018. 4.   Cancer-related pain Patient taking oxycodone and hydrocodone/acetaminophen.  Pain medications have been written by Dr. Caryn Section.       Discuss consideration of follow-up with palliative care, Michael Harm, NP.  Patient in agreement. 5.   Palliative care consult with Michael Harm, NP. 6.   RTC in 3 weeks for MD assessment and labs (CBC with diff, CMP, PSA).  I discussed the assessment and treatment plan with the patient.  The patient was provided an opportunity to ask questions and all were answered.  The patient agreed with the plan and demonstrated an understanding of the instructions.  The patient was advised to call back  or seek an in person evaluation if the symptoms worsen or if the condition fails to improve as anticipated.  I provided 15 minutes (11:14 AM - 11:29 AM) of face-to-face video visit time during this this encounter and > 50% was spent counseling as documented under my assessment and plan.  I provided these services from the [facility] office.   Nolon Stalls, MD, PhD  10/29/2018, 11:14 AM  I, Molly Dorshimer, am acting as Education administrator for [employer]. Mike Gip, MD, PhD.  I, Frannie Shedrick C. Mike Gip, MD, have reviewed the above documentation for accuracy and completeness, and I agree with the above.

## 2018-10-27 LAB — TESTOSTERONE: Testosterone: <3 ng/dL — ABNORMAL LOW (ref 264–916)

## 2018-10-29 ENCOUNTER — Encounter: Payer: Self-pay | Admitting: Hematology and Oncology

## 2018-10-29 ENCOUNTER — Inpatient Hospital Stay: Payer: Medicare Other | Attending: Hematology and Oncology | Admitting: Hematology and Oncology

## 2018-10-29 ENCOUNTER — Other Ambulatory Visit: Payer: Medicare Other

## 2018-10-29 DIAGNOSIS — C7951 Secondary malignant neoplasm of bone: Secondary | ICD-10-CM | POA: Diagnosis not present

## 2018-10-29 DIAGNOSIS — Z809 Family history of malignant neoplasm, unspecified: Secondary | ICD-10-CM | POA: Insufficient documentation

## 2018-10-29 DIAGNOSIS — M2550 Pain in unspecified joint: Secondary | ICD-10-CM | POA: Insufficient documentation

## 2018-10-29 DIAGNOSIS — R197 Diarrhea, unspecified: Secondary | ICD-10-CM | POA: Insufficient documentation

## 2018-10-29 DIAGNOSIS — I129 Hypertensive chronic kidney disease with stage 1 through stage 4 chronic kidney disease, or unspecified chronic kidney disease: Secondary | ICD-10-CM | POA: Insufficient documentation

## 2018-10-29 DIAGNOSIS — E875 Hyperkalemia: Secondary | ICD-10-CM | POA: Insufficient documentation

## 2018-10-29 DIAGNOSIS — Z7189 Other specified counseling: Secondary | ICD-10-CM

## 2018-10-29 DIAGNOSIS — Z8249 Family history of ischemic heart disease and other diseases of the circulatory system: Secondary | ICD-10-CM | POA: Insufficient documentation

## 2018-10-29 DIAGNOSIS — R590 Localized enlarged lymph nodes: Secondary | ICD-10-CM | POA: Insufficient documentation

## 2018-10-29 DIAGNOSIS — G893 Neoplasm related pain (acute) (chronic): Secondary | ICD-10-CM | POA: Insufficient documentation

## 2018-10-29 DIAGNOSIS — I10 Essential (primary) hypertension: Secondary | ICD-10-CM

## 2018-10-29 DIAGNOSIS — F17299 Nicotine dependence, other tobacco product, with unspecified nicotine-induced disorders: Secondary | ICD-10-CM | POA: Insufficient documentation

## 2018-10-29 DIAGNOSIS — M898X6 Other specified disorders of bone, lower leg: Secondary | ICD-10-CM | POA: Insufficient documentation

## 2018-10-29 DIAGNOSIS — R112 Nausea with vomiting, unspecified: Secondary | ICD-10-CM | POA: Insufficient documentation

## 2018-10-29 DIAGNOSIS — M79606 Pain in leg, unspecified: Secondary | ICD-10-CM | POA: Insufficient documentation

## 2018-10-29 DIAGNOSIS — R5383 Other fatigue: Secondary | ICD-10-CM | POA: Insufficient documentation

## 2018-10-29 DIAGNOSIS — N289 Disorder of kidney and ureter, unspecified: Secondary | ICD-10-CM | POA: Diagnosis not present

## 2018-10-29 DIAGNOSIS — Z79899 Other long term (current) drug therapy: Secondary | ICD-10-CM

## 2018-10-29 DIAGNOSIS — C61 Malignant neoplasm of prostate: Secondary | ICD-10-CM

## 2018-10-29 DIAGNOSIS — N400 Enlarged prostate without lower urinary tract symptoms: Secondary | ICD-10-CM | POA: Insufficient documentation

## 2018-10-29 DIAGNOSIS — N390 Urinary tract infection, site not specified: Secondary | ICD-10-CM | POA: Insufficient documentation

## 2018-10-29 DIAGNOSIS — M898X9 Other specified disorders of bone, unspecified site: Secondary | ICD-10-CM | POA: Insufficient documentation

## 2018-10-29 DIAGNOSIS — B9561 Methicillin susceptible Staphylococcus aureus infection as the cause of diseases classified elsewhere: Secondary | ICD-10-CM | POA: Insufficient documentation

## 2018-10-29 DIAGNOSIS — Z833 Family history of diabetes mellitus: Secondary | ICD-10-CM | POA: Insufficient documentation

## 2018-10-29 NOTE — Progress Notes (Signed)
No new changes noted today. The patient name and DOB has been verified by phone today. 

## 2018-10-31 ENCOUNTER — Ambulatory Visit: Payer: Medicare Other | Admitting: Hematology and Oncology

## 2018-10-31 ENCOUNTER — Other Ambulatory Visit: Payer: Medicare Other

## 2018-11-01 ENCOUNTER — Other Ambulatory Visit: Payer: Self-pay | Admitting: Hematology and Oncology

## 2018-11-01 DIAGNOSIS — C7951 Secondary malignant neoplasm of bone: Secondary | ICD-10-CM

## 2018-11-01 DIAGNOSIS — C61 Malignant neoplasm of prostate: Secondary | ICD-10-CM

## 2018-11-02 ENCOUNTER — Other Ambulatory Visit: Payer: Self-pay | Admitting: Radiology

## 2018-11-02 DIAGNOSIS — N135 Crossing vessel and stricture of ureter without hydronephrosis: Secondary | ICD-10-CM

## 2018-11-02 DIAGNOSIS — C61 Malignant neoplasm of prostate: Secondary | ICD-10-CM

## 2018-11-05 ENCOUNTER — Inpatient Hospital Stay: Payer: Medicare Other | Attending: Hospice and Palliative Medicine | Admitting: Oncology

## 2018-11-06 ENCOUNTER — Other Ambulatory Visit: Payer: Self-pay

## 2018-11-06 DIAGNOSIS — G893 Neoplasm related pain (acute) (chronic): Secondary | ICD-10-CM

## 2018-11-06 MED ORDER — OXYCODONE HCL 10 MG PO TABS: ORAL_TABLET | ORAL | 0 refills | Status: DC

## 2018-11-06 MED ORDER — HYDROCODONE-ACETAMINOPHEN 10-325 MG PO TABS: 1.0000 | ORAL_TABLET | ORAL | 0 refills | Status: DC | PRN

## 2018-11-06 MED FILL — XTANDI 40 MG CAPSULE: 40 | 30 days supply | Qty: 120 | Fill #0

## 2018-11-06 NOTE — Telephone Encounter (Signed)
Patient wife Michael Small (Michael Small) called in requesting a refill on patients Norco and Oxycodone. Please advise.

## 2018-11-07 ENCOUNTER — Other Ambulatory Visit: Payer: Self-pay

## 2018-11-07 ENCOUNTER — Other Ambulatory Visit: Payer: Medicare Other

## 2018-11-07 ENCOUNTER — Encounter
Admission: RE | Admit: 2018-11-07 | Discharge: 2018-11-07 | Disposition: A | Discharge status: Home / Self Care | Payer: Medicare Other | Source: Ambulatory Visit | Attending: Urology | Admitting: Urology

## 2018-11-07 DIAGNOSIS — Z1159 Encounter for screening for other viral diseases: Secondary | ICD-10-CM | POA: Diagnosis not present

## 2018-11-07 DIAGNOSIS — Z01812 Encounter for preprocedural laboratory examination: Secondary | ICD-10-CM | POA: Insufficient documentation

## 2018-11-07 HISTORY — DX: Headache, unspecified: R51.9

## 2018-11-07 NOTE — Pre-Procedure Instructions (Signed)
NM myocardial perfusion SPECT multiple (stress and rest)10/09/2018 [place] Result Impression  Determinate Lexiscan infusion EKG Mild LV systolic dysfunction with ejection fraction of 44% Fixed distal anterior and apical myocardial perfusion defect consistent  previous infarct and/or scar without evidence of myocardial ischemia  Serafina Royals  Result Narrative  C[facility]Y [facility] [place], [place], [place] [address] [phone]  Procedure: Pharmacologic Myocardial Perfusion Imaging  ONE day procedure  Indication: Atherosclerosis of native coronary artery of native heart with  stable angina pectoris (CMS-HCC) Plan: NM myocardial perfusion SPECT multiple (stress     and rest), ECG stress test only  Ordering Physician:   Dr. Bartholome Bill   Clinical History: 63 y.o. year old male Vitals: Height: 71 in Weight: 163 lb Cardiac risk factors include:   Smoking, Hyperlipidemia, Previous MI, PCI, HTN and CAD    Procedure:  Pharmacologic stress testing was performed with Regadenoson using a single  use 0.4mg /78ml (0.08 mg/ml) prefilled syringe intravenously infused as a  bolus dose over 10-15 seconds. The stress test was stopped due to Infusion  completion. Blood pressure response was normal. The patient did not  develop any symptoms other than fatigue during the procedure.   Rest HR: 82bpm Rest BP: 140/57mmHg Max HR: 107bpm Min BP: 144/50mmHg  Stress Test Administered by: Oswald Hillock, CMA  ECG Interpretation: Rest ECG: normal sinus rhythm, none Stress ECG: normal sinus rhythm,  Recovery ECG: normal sinus rhythm ECG Interpretation: non-diagnostic due to pharmacologic testing.   Administrations This Visit   regadenoson (LEXISCAN) 0.4 mg/5 mL inj syringe 0.4 mg   Admin Date 10/03/2018 [place] Given Dose 0.4 mg Route Intravenous Administered By Herbert Seta, CNMT     technetium Tc33m sestamibi (CARDIOLITE) injection 91.47 millicurie   Admin Date 10/03/2018 Action Given Dose 82.95 millicurie Route Intravenous Administered By Herbert Seta, CNMT     technetium Tc39m sestamibi (CARDIOLITE) injection 62.13 millicurie   Admin Date 10/03/2018 Action Given Dose 08.65 millicurie Route Intravenous Administered By Herbert Seta, CNMT       Gated post-stress perfusion imaging was performed 30 minutes after stress.  Rest images were performed 30 minutes after injection.  Gated LV Analysis:   Summary of LV Perfusion: Abnormalinfarction,  Summary of LV Function: Abnormal   TID Ratio: 1.09  LVEF= 44%  FINDINGS: Regional wall motion: demonstrates akinesis of the Apical and distal  serial anterior wall. The overall quality of the study is good.  Artifacts noted: no Left ventricular cavity: normal.  Perfusion Analysis: SPECT images demonstrate moderate perfusion  abnormality of moderate intensity is present in the distal anterior and  apical myocardial region on the stress images. Resting images showed  continued distal anterior and apical myocardial perfusion defect  consistent with previous anterior myocardial infarction and/or scar  without evidence of myocardial ischemia defect type : Fixed   Status Results Details   Encounter Summary

## 2018-11-07 NOTE — Pre-Procedure Instructions (Signed)
ECG 12-lead4/29/2020 [place] Component Name Value Ref Range  Vent Rate (bpm) 93   PR Interval (msec) 134   QRS Interval (msec) 90   QT Interval (msec) 358   QTc (msec) 445   Other Result Information  This result has an attachment that is not available.  Result Narrative  Normal sinus rhythm Possible Left atrial enlargement Anteroseptal infarct , age undetermined Abnormal ECG No previous ECGs available I reviewed and concur with this report. Electronically signed [id number] MD, Darnell Level ([id number]) on 09/27/2018 10:08:31 AM  Status Results Details

## 2018-11-07 NOTE — Pre-Procedure Instructions (Signed)
Progress Notes - documented in this encounter Flossie Dibble, MD - 10/03/2018 2:30 PM EDT Formatting of this note might be different from the original. Established Patient Visit   Chief Complaint: Chief Complaint  Patient presents with  . Hypertension  . Coronary Artery Disease  Date of Service: 10/03/2018 Date of Birth: 1955-08-13 PCP: Birdie Sons, MD  History of Present Illness: Michael Small is a 63 y.o.male patient  Patient returns today for further evaluation and treatment options for prostate cancer therapy. The patient has had known coronary disease status post previous anterior and apical myocardial infarction in the remote past. He had a PCI and stent placement of that artery and has done well for many many years. His only treatment has been minimal hypertension control as well as treatment of his hyperlipidemia. Occasionally he has taken aspirin in the past. He is not had any episodes of chest pain shortness of breath or congestive heart failure despite having multiple rounds of chemotherapy and other treatment for prostate cancer. He has not responded well to apparently most of the chemotherapy recently and therefore is needing enzalutamide. This medication has been subject to evaluation from multiple side effects standpoint and cardiovascular disease has been evaluated. It does show that enzalutamide definitely cause significant hypertension which can be treated with antihypertensives. Additionally enzalutamide has a slightly other cardiovascular risk although it does not appear to be directly related to myocardial infarction. There are limited studies with this and currently the patient has no other choices able to be treated. Currently he has had a stress test showing a fixed anterior apical myocardial perfusion defect consistent with previous infarct and/or scar as well as an echocardiogram showing mild apical defect consistent with previous infarct as before in the remote past with  ejection fraction of 40 to 45%. This has therefore been deemed stable at this time on appropriate medication management and therefore the patient is at lowest risk possible for further cardiovascular complication with enzalutamide treatment. Past Medical and Surgical History  Past Medical History Past Medical History:  Diagnosis Date  . History of cancer  . Hyperlipidemia  . Hypertension   Past Surgical History He has a past surgical history that includes stent in kidney (Bilateral).   Medications and Allergies  Current Medications  Current Outpatient Medications on File Prior to Visit  Medication Sig Dispense Refill  . amLODIPine (NORVASC) 10 MG tablet Take 10 mg by mouth once daily  . atorvastatin (LIPITOR) 20 MG tablet Take 20 mg by mouth once daily 6  . buPROPion (WELLBUTRIN SR) 150 MG SR tablet TAKE 1 TABLET BY MOUTH 3 TIMES A DAY**NEEDS OFFICE VISIT** 5  . diphenoxylate-atropine (LOMOTIL) 2.5-0.025 mg tablet Take 1-2 tablets by mouth as needed  . enzalutamide (XTANDI) 40 mg capsule Take 160 mg by mouth once daily  . fentaNYL (DURAGESIC) 50 mcg/hr patch Place 1 patch onto the skin every third day  . HYDROcodone-acetaminophen (NORCO) 5-325 mg tablet Take 1 tablet by mouth 4 (four) times daily   No current facility-administered medications on file prior to visit.   Allergies: Abiraterone  Social and Family History  Social History reports that he has been smoking cigars. He has never used smokeless tobacco. He reports previous alcohol use.  Family History No family history on file.  Review of Systems   Review of Systems  Positive for weakness shortness of breath Negative for weight gain weight loss, vision change, hearing loss, cough, congestion, PND, orthopnea, heartburn, nausea, diaphoresis, vomiting, diarrhea, bloody  stool, melena, stomach pain, extremity pain, leg weakness, leg cramping, leg blood clots, headache, blackouts, nosebleed, trouble swallowing, mouth pain,  urinary frequency, urination at night, muscle weakness, skin lesions, skin rashes, tingling ,ulcers, numbness, anxiety, and/or depression Physical Examination   Vitals:BP 140/68  Pulse 82  Ht 180.3 cm (5\' 11" )  Wt 74 kg (163 lb 2.3 oz)  BMI 22.75 kg/m  Ht:180.3 cm (5\' 11" ) Wt:74 kg (163 lb 2.3 oz) AQT:MAUQ surface area is 1.93 meters squared. Body mass index is 22.75 kg/m. Appearance: well appearing in no acute distress HEENT: Pupils equally reactive to light and accomodation, no xanthalasma  Neck: Supple, no apparent thyromegaly, masses, or lymphadenopathy  Lungs: normal respiratory effort; no crackles, no rhonchi, no wheezes Heart: Regular rate and rhythm. Normal S1 S2 No gallops, murmur, no rub, PMI is normal size and placement. carotid upstroke normal without bruit. Jugular venous pressure is normal Abdomen: soft, nontender, not distended with normal bowel sounds. No apparent hepatosplenomegally. Abdominal aorta is normal size without bruit Extremities: no edema, no ulcers, no clubbing, no cyanosis Peripheral Pulses: 2+ in upper extremities, 2+ femoral pulses bilaterally, 2+lower extremity  Musculoskeletal; Normal muscle tone without kyphosis Neurological: Oriented and Alert, Cranial nerves intact  Assessment   63 y.o. male with  Encounter Diagnoses  Name Primary?  . Atherosclerosis of native coronary artery of native heart without angina pectoris Yes  . Essential hypertension  . Non-ST elevation myocardial infarction (NSTEMI), subendocardial infarction, subsequent episode of care (CMS-HCC)   Plan  -Proceed with enzalutamide treatment for metastatic prostate cancer due to stability of previous anterior apical myocardial infarction and coronary atherosclerosis without evidence of congestive heart failure angina or myocardial ischemia on appropriate medication management for risk reduction -Continue hypertension control with current medical regimen and follow very closely for  worsening episodes of hypertension which may be more common with enzalutamide treatment -Continue high intensity cholesterol therapy for coronary artery disease -Okay for use of aspirin for further risk reduction cardiovascular event -No further cardiac diagnostics necessary at this time  No orders of the defined types were placed in this encounter.  Return in about 6 months (around 04/05/2019).  Flossie Dibble, MD

## 2018-11-07 NOTE — Pre-Procedure Instructions (Signed)
Echo complete5/10/2018 [place] Component Name Value Ref Range  LV Ejection Fraction (%) 45   Aortic Valve Regurgitation Grade none   Aortic Valve Stenosis Grade none   Aortic Valve Max Velocity (m/s) 1.5 m/sec  Aortic Valve Stenosis Mean Gradient (mmHg) 4.3 mmHg  Mitral Valve Regurgitation Grade trivial   Mitral Valve Stenosis Grade none   Tricuspid Valve Regurgitation Grade trivial   LV End Diastolic Diameter (cm) 5.2 cm  LV End Systolic Diameter (cm) 4.1 cm  LV Septum Wall Thickness (cm) 0.95 cm  LV Posterior Wall Thickness (cm) 0.98 cm  Left Atrium Diameter (cm) 4.2 cm  Result Narrative              CARDIOLOGY DEPARTMENT        [facility]                  [id number]      [place] #: [id number]      [address] [place], [place] [address]    Date: 10/03/2018 08: 7 AM                                Adult  Male  Age: 63 yrs      ECHOCARDIOGRAM REPORT               Outpatient                                KC^^KCWC    STUDY:CHEST WALL        TAPE:0000: 00: 0: 00: 00 MD1: [name], MD Bruce    ECHO:Yes  DOPPLER:Yes    FILE:0000-000-000    BP: 160/80 mmHg    COLOR:Yes  CONTRAST:No   MACHINE:Philips  RV BIOPSY:No     3D:No SOUND QLTY:Moderate      Height: 71 in   MEDIUM:None                       Weight: 163 lb                                BSA: 1.9 m2 _________________________________________________________________________________________        HISTORY: DOE, Chest pain         REASON: Assess, LV function       INDICATION: I25.118 Atherosclerosis of native coronary artery of native  hear _________________________________________________________________________________________ ECHOCARDIOGRAPHIC MEASUREMENTS 2D DIMENSIONS AORTA         Values  Normal Range  MAIN PA     Values  Normal Range        Annulus: nm*     [2.3-2.9]     PA Main: nm*    [1.5-2.1]       Aorta Sin: nm*     [3.1-3.7]  RIGHT VENTRICLE      ST Junction: nm*     [2.6-3.2]     RV Base: nm*    [<4.2]       Asc.Aorta: nm*     [2.6-3.4]     RV Mid: nm*    [<3.5] LEFT VENTRICLE  RV Length: nm*    [<8.6]         LVIDd: 5.2 cm    [4.2-5.9]  INFERIOR VENA CAVA         LVIDs: 4.1 cm            Max. IVC: nm*    [<=2.1]           FS: 22.3 %    [>25]      Min. IVC: nm*          SWT: 0.95 cm   [0.6-1.0]  ------------------          PWT: 0.98 cm   [0.6-1.0]  nm* - not measured LEFT ATRIUM        LA Diam: 4.2 cm    [3.0-4.0]      LA A4C Area: nm*     [<20]       LA Volume: nm*     [18-58] _________________________________________________________________________________________ ECHOCARDIOGRAPHIC DESCRIPTIONS AORTIC ROOT          Size: Normal       Dissection: INDETERM FOR DISSECTION AORTIC VALVE        Leaflets: Tricuspid          Morphology: Normal        Mobility: Fully mobile LEFT VENTRICLE          Size: Normal            Anterior: Normal      Contraction: REGIONALLY IMPAIRED      Lateral: Normal       Closest EF: 45% (Estimated)         Septal: HYPOCONTRACTILE       LV Masses: No Masses            Apical: AKINETIC          LVH: None             Inferior: Normal                           Posterior: Normal      Dias.FxClass: (Grade  1) relaxation abnormal, E/A reversal MITRAL VALVE        Leaflets: Normal            Mobility: Fully mobile       Morphology: Normal LEFT ATRIUM          Size: Normal            LA Masses: No masses       IA Septum: Normal IAS MAIN PA          Size: Normal PULMONIC VALVE       Morphology: Normal            Mobility: Fully mobile RIGHT VENTRICLE       RV Masses: No Masses             Size: Normal       Free Wall: Normal           Contraction: Normal TRICUSPID VALVE        Leaflets: Normal            Mobility: Fully mobile       Morphology: Normal RIGHT ATRIUM          Size: Normal            RA Other: None        RA Mass: No masses PERICARDIUM  Fluid: No effusion INFERIOR VENACAVA          Size: Normal Normal respiratory collapse _________________________________________________________________________________________  DOPPLER ECHO and OTHER SPECIAL PROCEDURES         Aortic: No AR           No AS             153.6 cm/sec peak vel   9.4 mmHg peak grad             4.3 mmHg mean grad     2.2 cm^2 by DOPPLER         Mitral: TRIVIAL MR         No MS             MV Inflow E Vel = 96.4 cm/sec   MV Annulus E'Vel = nm*             E/E'Ratio = nm*       Tricuspid: TRIVIAL TR         No TS       Pulmonary: TRIVIAL PR         No PS _________________________________________________________________________________________ INTERPRETATION MILD LV SYSTOLIC DYSFUNCTION (See above) NORMAL RIGHT VENTRICULAR SYSTOLIC FUNCTION TRIVIAL REGURGITATION NOTED (See above) NO VALVULAR STENOSIS _________________________________________________________________________________________ Electronically signed by   MD  Serafina Royals on 10/03/2018 01: 18 PM      Performed By: Cephus Shelling, RVT   Ordering Physician: [name], MD Bruce _________________________________________________________________________________________  Other Result Information  Interface, Text Results In - 10/03/2018  1:19 PM EDT                         CARDIOLOGY DEPARTMENT                [place], [place]                                    [id number]           [place] #: [id number]           [address], [place], [place] [address]       Date: 10/03/2018 08: 46 AM                                                              Adult   Male   Age: 63 yrs           ECHOCARDIOGRAM REPORT                              Outpatient  KC^^KCWC      STUDY:CHEST WALL               TAPE:0000: 00: 0: 00: 00 MD1: [name], MD Bruce       ECHO:Yes    DOPPLER:Yes       FILE:0000-000-000        BP: 160/80 mmHg      COLOR:Yes   CONTRAST:No     MACHINE:Philips  RV BIOPSY:No          3D:No  SOUND QLTY:Moderate            Height: 71 in     MEDIUM:None                                              Weight: 163 lb                                                              BSA: 1.9 m2 _________________________________________________________________________________________               HISTORY: DOE, Chest pain                REASON: Assess, LV function            INDICATION: I25.118 Atherosclerosis of native coronary artery of native hear _________________________________________________________________________________________ ECHOCARDIOGRAPHIC MEASUREMENTS 2D DIMENSIONS AORTA                  Values   Normal Range   MAIN PA         Values    Normal Range               Annulus: nm*          [2.3-2.9]         PA Main: nm*       [1.5-2.1]             Aorta Sin: nm*          [3.1-3.7]    RIGHT VENTRICLE            ST Junction: nm*          [2.6-3.2]         RV Base: nm*       [<4.2]             Asc.Aorta: nm*          [2.6-3.4]          RV Mid: nm*       [<3.5] LEFT VENTRICLE                                      RV Length: nm*       [<8.6]                 LVIDd: 5.2 cm       [4.2-5.9]    INFERIOR VENA CAVA                 LVIDs: 4.1 cm  Max. IVC: nm*       [<=2.1]                    FS: 22.3 %       [>25]            Min. IVC: nm*                   SWT: 0.95 cm      [0.6-1.0]    ------------------                   PWT: 0.98 cm      [0.6-1.0]    nm* - not measured LEFT ATRIUM               LA Diam: 4.2 cm       [3.0-4.0]           LA A4C Area: nm*          [<20]             LA Volume: nm*          [18-58] _________________________________________________________________________________________ ECHOCARDIOGRAPHIC DESCRIPTIONS AORTIC ROOT                  Size: Normal            Dissection: INDETERM FOR DISSECTION AORTIC VALVE              Leaflets: Tricuspid                   Morphology: Normal              Mobility: Fully mobile LEFT VENTRICLE                  Size: Normal                        Anterior: Normal           Contraction: REGIONALLY IMPAIRED            Lateral: Normal            Closest EF: 45% (Estimated)                 Septal: HYPOCONTRACTILE             LV Masses: No Masses                       Apical: AKINETIC                   LVH: None                          Inferior: Normal                                                     Posterior: Normal          Dias.FxClass: (Grade 1) relaxation abnormal, E/A reversal MITRAL VALVE              Leaflets: Normal                        Mobility: Fully mobile  Morphology: Normal LEFT ATRIUM                  Size: Normal                       LA Masses: No masses             IA Septum: Normal IAS MAIN PA                  Size: Normal PULMONIC VALVE            Morphology: Normal                         Mobility: Fully mobile RIGHT VENTRICLE             RV Masses: No Masses                         Size: Normal             Free Wall: Normal                     Contraction: Normal TRICUSPID VALVE              Leaflets: Normal                        Mobility: Fully mobile            Morphology: Normal RIGHT ATRIUM                  Size: Normal                        RA Other: None               RA Mass: No masses PERICARDIUM                 Fluid: No effusion INFERIOR VENACAVA                  Size: Normal Normal respiratory collapse _________________________________________________________________________________________  DOPPLER ECHO and OTHER SPECIAL PROCEDURES                Aortic: No AR                      No AS                        153.6 cm/sec peak vel      9.4 mmHg peak grad                        4.3 mmHg mean grad         2.2 cm^2 by DOPPLER                Mitral: TRIVIAL MR                 No MS                        MV Inflow E Vel = 96.4 cm/sec      MV Annulus E'Vel = nm*                        E/E'Ratio = nm*  Tricuspid: TRIVIAL TR                 No TS             Pulmonary: TRIVIAL PR                 No PS _________________________________________________________________________________________ INTERPRETATION MILD LV SYSTOLIC DYSFUNCTION (See above) NORMAL RIGHT VENTRICULAR SYSTOLIC FUNCTION TRIVIAL REGURGITATION NOTED (See above) NO VALVULAR STENOSIS _________________________________________________________________________________________ Electronically signed by      MD Serafina Royals on 10/03/2018 01: 18 PM          Performed By: Cephus Shelling, RVT    Ordering Physician: [name], MD Bruce _________________________________________________________________________________________  Status Results Details   Encounter Summary

## 2018-11-07 NOTE — Patient Instructions (Signed)
Your procedure is scheduled on: 11-14-18 [facility] Report to Same Day Surgery 2nd floor medical mall [facility] Entrance-take elevator on left to 2nd floor.  Check in with surgery information desk.) To find out your arrival time please call [phone] between 1PM - 3PM on 11-13-18 TUESDAY  Remember: Instructions that are not followed completely may result in serious medical risk, up to and including death, or upon the discretion of your surgeon and anesthesiologist your surgery may need to be rescheduled.    _x___ 1. Do not eat food after midnight the night before your procedure. NO GUM OR CANDY AFTER MIDNIGHT. You may drink clear liquids up to 2 hours before you are scheduled to arrive at the hospital for your procedure.  Do not drink clear liquids within 2 hours of your scheduled arrival to the hospital.  Clear liquids include  --Water or Apple juice without pulp  --Clear carbohydrate beverage such as ClearFast or Gatorade  --Black Coffee or Clear Tea (No milk, no creamers, do not add anything to the coffee or Tea   ____Ensure clear carbohydrate drink on the way to the hospital for bariatric patients  ____Ensure clear carbohydrate drink 3 hours before surgery for Dr Dwyane Luo patients if physician instructed.    __x__ 2. No Alcohol for 24 hours before or after surgery.   __x__3. No Smoking or e-cigarettes for 24 prior to surgery.  Do not use any chewable tobacco products for at least 6 hour prior to surgery   ____  4. Bring all medications with you on the day of surgery if instructed.    __x__ 5. Notify your doctor if there is any change in your medical condition     (cold, fever, infections).    x___6. On the morning of surgery brush your teeth with toothpaste and water.  You may rinse your mouth with mouth wash if you wish.  Do not swallow any toothpaste or mouthwash.   Do not wear jewelry, make-up, hairpins, clips or nail polish.  Do not wear lotions, powders, or perfumes.  You may wear deodorant.  Do not shave 48 hours prior to surgery. Men may shave face and neck.  Do not bring valuables to the hospital.    [facility] is not responsible for any belongings or valuables.               Contacts, dentures or bridgework may not be worn into surgery.  Leave your suitcase in the car. After surgery it may be brought to your room.  For patients admitted to the hospital, discharge time is determined by your treatment team.  _  Patients discharged the day of surgery will not be allowed to drive home.  You will need someone to drive you home and stay with you the night of your procedure.    Please read over the following fact sheets that you were given:   [facility] Preparing for Surgery   _x___ TAKE THE FOLLOWING MEDICATION THE MORNING OF SURGERY WITH A SMALL SIP OF WATER. These include:  1. AMLODIPINE (NORVASC)  2. WELLBUTRIN (BUPROPION)   3. HYDROCODONE (NORCO)  4. OXYCODONE  5.  6.  ____Fleets enema or Magnesium Citrate as directed.   ____ Use CHG Soap or sage wipes as directed on instruction sheet   ____ Use inhalers on the day of surgery and bring to hospital day of surgery  ____ Stop Metformin and Janumet 2 days prior to surgery.    ____ Take 1/2 of usual  insulin dose the night before surgery and none on the morning  surgery.   ____ Follow recommendations from Cardiologist, Pulmonologist or PCP regarding stopping Aspirin, Coumadin, Plavix ,Eliquis, Effient, or Pradaxa, and Pletal.  X____Stop Anti-inflammatories such as Advil, Aleve, Ibuprofen, Motrin, Naproxen, Naprosyn, Goodies powders or aspirin products. OK to take Tylenol/HYDROCODONE/OXYCODONE    ____ Stop supplements until after surgery.     ____ Bring C-Pap to the hospital.

## 2018-11-08 LAB — URINE CULTURE: Culture: <10000 — AB

## 2018-11-09 ENCOUNTER — Other Ambulatory Visit
Admission: RE | Admit: 2018-11-09 | Discharge: 2018-11-09 | Disposition: A | Discharge status: Home / Self Care | Payer: Medicare Other | Source: Ambulatory Visit | Attending: Urology | Admitting: Urology

## 2018-11-09 ENCOUNTER — Other Ambulatory Visit: Payer: Self-pay

## 2018-11-09 DIAGNOSIS — Z01812 Encounter for preprocedural laboratory examination: Secondary | ICD-10-CM | POA: Diagnosis not present

## 2018-11-09 DIAGNOSIS — Z1159 Encounter for screening for other viral diseases: Secondary | ICD-10-CM | POA: Diagnosis not present

## 2018-11-10 LAB — NOVEL CORONAVIRUS, NAA (HOSP ORDER, SEND-OUT TO REF LAB; TAT 18-24 HRS): SARS-CoV-2, NAA: NOT DETECTED

## 2018-11-13 MED ORDER — CEFAZOLIN SODIUM-DEXTROSE 2-4 GM/100ML-% IV SOLN: 2.0000 g | INTRAVENOUS | Status: DC

## 2018-11-13 NOTE — Pre-Procedure Instructions (Signed)
Cardiac clearance in Care Everywhere from Dr [name]

## 2018-11-14 ENCOUNTER — Ambulatory Visit: Payer: Medicare Other

## 2018-11-14 ENCOUNTER — Ambulatory Visit
Admission: RE | Admit: 2018-11-14 | Discharge: 2018-11-14 | Disposition: A | Discharge status: Home / Self Care | Payer: Medicare Other | Attending: Urology | Admitting: Urology

## 2018-11-14 ENCOUNTER — Ambulatory Visit: Payer: Medicare Other | Admitting: Certified Registered"

## 2018-11-14 ENCOUNTER — Encounter
Admission: RE | Disposition: A | Discharge status: Home / Self Care | Payer: Self-pay | Source: Home / Self Care | Attending: Urology

## 2018-11-14 ENCOUNTER — Encounter: Payer: Self-pay | Admitting: *Deleted

## 2018-11-14 DIAGNOSIS — E785 Hyperlipidemia, unspecified: Secondary | ICD-10-CM | POA: Insufficient documentation

## 2018-11-14 DIAGNOSIS — C61 Malignant neoplasm of prostate: Secondary | ICD-10-CM

## 2018-11-14 DIAGNOSIS — F329 Major depressive disorder, single episode, unspecified: Secondary | ICD-10-CM | POA: Insufficient documentation

## 2018-11-14 DIAGNOSIS — I129 Hypertensive chronic kidney disease with stage 1 through stage 4 chronic kidney disease, or unspecified chronic kidney disease: Secondary | ICD-10-CM | POA: Insufficient documentation

## 2018-11-14 DIAGNOSIS — Z466 Encounter for fitting and adjustment of urinary device: Secondary | ICD-10-CM | POA: Diagnosis not present

## 2018-11-14 DIAGNOSIS — Z192 Hormone resistant malignancy status: Secondary | ICD-10-CM | POA: Diagnosis not present

## 2018-11-14 DIAGNOSIS — I252 Old myocardial infarction: Secondary | ICD-10-CM | POA: Insufficient documentation

## 2018-11-14 DIAGNOSIS — Z79899 Other long term (current) drug therapy: Secondary | ICD-10-CM | POA: Diagnosis not present

## 2018-11-14 DIAGNOSIS — N135 Crossing vessel and stricture of ureter without hydronephrosis: Secondary | ICD-10-CM | POA: Insufficient documentation

## 2018-11-14 DIAGNOSIS — Z955 Presence of coronary angioplasty implant and graft: Secondary | ICD-10-CM | POA: Diagnosis not present

## 2018-11-14 DIAGNOSIS — Z960 Presence of urogenital implants: Secondary | ICD-10-CM | POA: Diagnosis not present

## 2018-11-14 DIAGNOSIS — Z419 Encounter for procedure for purposes other than remedying health state, unspecified: Secondary | ICD-10-CM

## 2018-11-14 DIAGNOSIS — M1990 Unspecified osteoarthritis, unspecified site: Secondary | ICD-10-CM | POA: Diagnosis not present

## 2018-11-14 DIAGNOSIS — N131 Hydronephrosis with ureteral stricture, not elsewhere classified: Secondary | ICD-10-CM | POA: Diagnosis not present

## 2018-11-14 DIAGNOSIS — K219 Gastro-esophageal reflux disease without esophagitis: Secondary | ICD-10-CM | POA: Insufficient documentation

## 2018-11-14 DIAGNOSIS — N1330 Unspecified hydronephrosis: Secondary | ICD-10-CM | POA: Diagnosis not present

## 2018-11-14 DIAGNOSIS — F17290 Nicotine dependence, other tobacco product, uncomplicated: Secondary | ICD-10-CM | POA: Insufficient documentation

## 2018-11-14 DIAGNOSIS — N189 Chronic kidney disease, unspecified: Secondary | ICD-10-CM | POA: Diagnosis not present

## 2018-11-14 HISTORY — PX: CYSTOSCOPY W/ URETERAL STENT PLACEMENT: SHX1429

## 2018-11-14 HISTORY — PX: CYSTOSCOPY W/ RETROGRADES: SHX1426

## 2018-11-14 SURGERY — CYSTOSCOPY, FLEXIBLE, WITH STENT REPLACEMENT
Anesthesia: General | Site: Ureter | Laterality: Bilateral

## 2018-11-14 MED ORDER — PROPOFOL 10 MG/ML IV BOLUS
INTRAVENOUS | Status: DC | PRN
  Administered 2018-11-14: 50 mg via INTRAVENOUS
  Administered 2018-11-14: 150 mg via INTRAVENOUS

## 2018-11-14 MED ORDER — FENTANYL CITRATE (PF) 100 MCG/2ML IJ SOLN: 25.0000 ug | INTRAMUSCULAR | Status: DC | PRN

## 2018-11-14 MED ORDER — LIDOCAINE HCL (CARDIAC) PF 100 MG/5ML IV SOSY
PREFILLED_SYRINGE | INTRAVENOUS | Status: DC | PRN
  Administered 2018-11-14: 50 mg via INTRAVENOUS

## 2018-11-14 MED ORDER — FENTANYL CITRATE (PF) 100 MCG/2ML IJ SOLN
INTRAMUSCULAR | Status: DC | PRN
  Administered 2018-11-14 (×4): 25 ug via INTRAVENOUS

## 2018-11-14 MED ORDER — PROPOFOL 10 MG/ML IV BOLUS
INTRAVENOUS | Status: AC
  Filled 2018-11-14: qty 40

## 2018-11-14 MED ORDER — FAMOTIDINE 20 MG PO TABS
ORAL_TABLET | ORAL | Status: AC
  Filled 2018-11-14: qty 1

## 2018-11-14 MED ORDER — FAMOTIDINE 20 MG PO TABS
20.0000 mg | ORAL_TABLET | Freq: Once | ORAL | Status: AC
  Administered 2018-11-14: 20 mg via ORAL

## 2018-11-14 MED ORDER — DEXAMETHASONE SODIUM PHOSPHATE 10 MG/ML IJ SOLN
INTRAMUSCULAR | Status: DC | PRN
  Administered 2018-11-14: 10 mg via INTRAVENOUS

## 2018-11-14 MED ORDER — LACTATED RINGERS IV SOLN
INTRAVENOUS | Status: DC | PRN
  Administered 2018-11-14: 09:00:00 via INTRAVENOUS

## 2018-11-14 MED ORDER — ONDANSETRON HCL 4 MG/2ML IJ SOLN
INTRAMUSCULAR | Status: DC | PRN
  Administered 2018-11-14: 4 mg via INTRAVENOUS

## 2018-11-14 MED ORDER — FENTANYL CITRATE (PF) 100 MCG/2ML IJ SOLN
INTRAMUSCULAR | Status: AC
  Filled 2018-11-14: qty 2

## 2018-11-14 MED ORDER — PROMETHAZINE HCL 25 MG/ML IJ SOLN: 6.2500 mg | INTRAMUSCULAR | Status: DC | PRN

## 2018-11-14 MED ORDER — IOPAMIDOL (ISOVUE-200) INJECTION 41%
INTRAVENOUS | Status: DC | PRN
  Administered 2018-11-14: 15 mL via INTRAVENOUS

## 2018-11-14 MED ORDER — PHENYLEPHRINE HCL (PRESSORS) 10 MG/ML IV SOLN
INTRAVENOUS | Status: DC | PRN
  Administered 2018-11-14 (×2): 50 ug via INTRAVENOUS

## 2018-11-14 MED ORDER — CEFAZOLIN SODIUM-DEXTROSE 2-4 GM/100ML-% IV SOLN
INTRAVENOUS | Status: AC
  Filled 2018-11-14: qty 100

## 2018-11-14 MED ORDER — LACTATED RINGERS IV SOLN
INTRAVENOUS | Status: DC
  Administered 2018-11-14: 07:00:00 via INTRAVENOUS

## 2018-11-14 SURGICAL SUPPLY — 21 items
BAG DRAIN CYSTO-URO LG1000N (MISCELLANEOUS) ×3 IMPLANT
BRUSH SCRUB EZ  4% CHG (MISCELLANEOUS) ×2
BRUSH SCRUB EZ 4% CHG (MISCELLANEOUS) ×1 IMPLANT
CATH URETL 5X70 OPEN END (CATHETERS) ×3 IMPLANT
DRAPE UTILITY 15X26 TOWEL STRL (DRAPES) ×3 IMPLANT
GLOVE BIO SURGEON STRL SZ8 (GLOVE) ×3 IMPLANT
GOWN STRL REUS W/ TWL LRG LVL3 (GOWN DISPOSABLE) ×1 IMPLANT
GOWN STRL REUS W/ TWL XL LVL3 (GOWN DISPOSABLE) ×1 IMPLANT
GOWN STRL REUS W/TWL LRG LVL3 (GOWN DISPOSABLE) ×2
GOWN STRL REUS W/TWL XL LVL3 (GOWN DISPOSABLE) ×8 IMPLANT
GUIDEWIRE STR DUAL SENSOR (WIRE) ×3 IMPLANT
KIT TURNOVER CYSTO (KITS) ×3 IMPLANT
PACK CYSTO AR (MISCELLANEOUS) ×3 IMPLANT
SET CYSTO W/LG BORE CLAMP LF (SET/KITS/TRAYS/PACK) ×3 IMPLANT
SOL .9 NS 3000ML IRR  AL (IV SOLUTION) ×2
SOL .9 NS 3000ML IRR UROMATIC (IV SOLUTION) ×1 IMPLANT
STENT URET 6FRX24 CONTOUR (STENTS) IMPLANT
STENT URET 6FRX26 CONTOUR (STENTS) IMPLANT
STENT URO INLAY 6FRX24CM (STENTS) ×6 IMPLANT
SURGILUBE 2OZ TUBE FLIPTOP (MISCELLANEOUS) ×3 IMPLANT
WATER STERILE IRR 1000ML POUR (IV SOLUTION) ×3 IMPLANT

## 2018-11-14 NOTE — Interval H&P Note (Signed)
History and Physical Interval Note:  11/14/2018 8:15 AM  Michael Small  has presented today for surgery, with the diagnosis of BILATERAL URETERAL OBSTRUCTION, PROSTATE CANCER.  The various methods of treatment have been discussed with the patient and family. After consideration of risks, benefits and other options for treatment, the patient has consented to  Procedure(s): CYSTOSCOPY WITH STENT REPLACEMENT (Bilateral) CYSTOSCOPY WITH RETROGRADE PYELOGRAM (Bilateral) as a surgical intervention.  The patient's history has been reviewed, patient examined, no change in status, stable for surgery.  I have reviewed the patient's chart and labs.  Questions were answered to the patient's satisfaction.     [place]

## 2018-11-14 NOTE — Anesthesia Preprocedure Evaluation (Signed)
Anesthesia Evaluation  Patient identified by MRN, date of birth, ID band Patient awake    Reviewed: Allergy & Precautions, NPO status , Patient's Chart, lab work & pertinent test results  History of Anesthesia Complications Negative for: history of anesthetic complications  Airway Mallampati: II  TM Distance: >3 FB Neck ROM: Full    Dental  (+) Edentulous Upper, Edentulous Lower   Pulmonary neg shortness of breath, neg sleep apnea, COPD, neg recent URI, Current Smoker,    breath sounds clear to auscultation- rhonchi (-) wheezing      Cardiovascular hypertension, (-) angina+ CAD, + Past MI and + Cardiac Stents (~5 yrs ago)   Rhythm:Regular Rate:Normal - Systolic murmurs and - Diastolic murmurs    Neuro/Psych PSYCHIATRIC DISORDERS Depression negative neurological ROS     GI/Hepatic Neg liver ROS, GERD  ,  Endo/Other  negative endocrine ROSneg diabetes  Renal/GU Renal InsufficiencyRenal disease     Musculoskeletal  (+) Arthritis ,   Abdominal (+) - obese,   Peds  Hematology negative hematology ROS (+)   Anesthesia Other Findings Past Medical History: No date: Chronic kidney disease No date: Depression No date: GERD (gastroesophageal reflux disease) No date: Hyperlipidemia No date: Hypertension No date: Myocardial infarction ([place])     Comment:  s/p stent No date: Neuromuscular disorder (HCC)     Comment:  numbness in arms and legs No date: Prostate cancer ([place])   Reproductive/Obstetrics                             Anesthesia Physical  Anesthesia Plan  ASA: III  Anesthesia Plan: General   Post-op Pain Management:    Induction: Intravenous  PONV Risk Score and Plan: 0 and Ondansetron and Midazolam  Airway Management Planned: LMA  Additional Equipment:   Intra-op Plan:   Post-operative Plan:   Informed Consent: I have reviewed the patients History and Physical, chart,  labs and discussed the procedure including the risks, benefits and alternatives for the proposed anesthesia with the patient or authorized representative who has indicated his/her understanding and acceptance.     Dental advisory given  Plan Discussed with: CRNA and Anesthesiologist  Anesthesia Plan Comments:         Anesthesia Quick Evaluation

## 2018-11-14 NOTE — H&P (Signed)
11/14/2018 8:11 AM   Michael Small Jan 03, 1956 [id number]  Referring provider: No referring provider defined for this encounter.  No chief complaint on file.   HPI: Michael Small is a 63 year old male with metastatic castrate resistant prostate cancer.  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  He presents today for cystoscopy with bilateral retrograde pyelograms and stent exchange.  His last PSA had increased to 180.  He is followed in medical oncology.   PMH: Past Medical History:  Diagnosis Date  . 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 Depression   . GERD (gastroesophageal reflux disease)    occ   . Headache    migraines occ  . Hyperlipidemia   . Hypertension   . Myocardial infarction [facility])    s/p stent  . Neuromuscular disorder (HCC)    numbness in arms and legs  . Prostate cancer [facility])     Surgical History: Past Surgical History:  Procedure Laterality Date  . CORONARY ANGIOPLASTY WITH STENT PLACEMENT  12/2011   [facility]  . CYSTOSCOPY W/ RETROGRADES Bilateral 05/24/2017   Procedure: CYSTOSCOPY WITH RETROGRADE PYELOGRAM;  Surgeon: [name], MD;  Location: [facility] ORS;  Service: Urology;  Laterality: Bilateral;  . CYSTOSCOPY W/ RETROGRADES Bilateral 11/21/2017   Procedure: CYSTOSCOPY WITH RETROGRADE PYELOGRAM;  Surgeon: [name], MD;  Location: [facility] ORS;  Service: Urology;  Laterality: Bilateral;  . CYSTOSCOPY W/ RETROGRADES Bilateral 03/20/2018   Procedure: CYSTOSCOPY WITH RETROGRADE PYELOGRAM;  Surgeon: [name], MD;  Location: [facility] ORS;  Service: Urology;  Laterality: Bilateral;  . CYSTOSCOPY W/ URETERAL STENT PLACEMENT Bilateral 11/21/2017   Procedure: CYSTOSCOPY WITH STENT REPLACEMENT;  Surgeon: [name], MD;  Location: [facility] OR[facility];  Service: Urology;  Laterality: Bilateral;  . CYSTOSCOPY W/ URETERAL STENT REMOVAL Bilateral 11/21/2017   Procedure: CYSTOSCOPY WITH STENT REMOVAL;  Surgeon: [name], MD;  Location: [facility] OR[facility];  Service: Urology;  Laterality: Bilateral;  . CYSTOSCOPY WITH STENT PLACEMENT Right 05/24/2017   Procedure: CYSTOSCOPY WITH STENT PLACEMENT;  Surgeon: [name], MD;  Location: [facility] OR[facility];  Service: Urology;  Laterality: Right;  . CYSTOSCOPY WITH STENT PLACEMENT Bilateral 03/20/2018   Procedure: CYSTOSCOPY WITH STENT EXCHANGE;  Surgeon: [name], MD;  Location: [facility] ORS;  Service: Urology;  Laterality: Bilateral;  . IR NEPHROSTOGRAM LEFT THRU EXISTING ACCESS  07/21/2017  . IR NEPHROSTOGRAM LEFT THRU EXISTING ACCESS  08/18/2017  . IR NEPHROSTOGRAM RIGHT THRU EXISTING ACCESS  08/18/2017  . IR NEPHROSTOMY PLACEMENT LEFT  06/15/2017  . IR NEPHROSTOMY PLACEMENT RIGHT  06/14/2017  . IR URETERAL STENT PLACEMENT EXISTING ACCESS LEFT  07/21/2017  . PICC LINE INSERTION N/A 08/02/2017   Procedure: PICC LINE INSERTION;  Surgeon: Katha Cabal, MD;  Location: [place] [facility];  Service: Cardiovascular;  Laterality: N/A;  . PROSTATE BIOPSY N/A 05/24/2017   Procedure: PROSTATE BIOPSY;  Surgeon: [name], MD;  Location: [facility] OR[facility];  Service: Urology;  Laterality: N/A;  . TEE WITHOUT CARDIOVERSION N/A 07/31/2017   Procedure: TRANSESOPHAGEAL ECHOCARDIOGRAM (TEE);  Surgeon: Teodoro Spray, MD;  Location: [facility] OR[facility];  Service: Cardiovascular;  Laterality: N/A;  . US ECHOCARDIOGRAPHY  12/2011   Mild LV dysfunction, EF=45%    Home Medications:  . amLODipine (NORVASC) 10 MG tablet Take 1 tablet (10 mg total) by mouth daily. 90 tablet 3  . diphenoxylate-atropine (LOMOTIL) 2.5-0.025 MG tablet Take 1 tablet  by mouth 4 (four) times daily as needed for diarrhea or loose stools. 120 tablet 3  . diphenoxylate-atropine (LOMOTIL) 2.5-0.025 MG tablet Take by mouth.    . enzalutamide (XTANDI) 40 MG capsule Take by mouth.    . fentaNYL (DURAGESIC) 12 MCG/HR Place 1 patch onto the skin every 3 (three) days. 1  patch 0  . fentaNYL (DURAGESIC) 25 MCG/HR Place 1 patch onto the skin every 3 (three) days.    Marland Kitchen HYDROcodone-acetaminophen (NORCO/VICODIN) 5-325 MG tablet Take by mouth.    . Oxycodone HCl 10 MG TABS One tablet every six hours scheduled, and one every three hours as needed for breakthrough pain 120 tablet 0  . buPROPion (WELLBUTRIN SR) 150 MG 12 hr tablet Take 1 tablet (150 mg total) by mouth 2 (two) times daily. (Patient not taking: Reported on 09/24/2018) 180 tablet 4  . enzalutamide (XTANDI) 40 MG capsule Take 4 capsules (160 mg total) by mouth daily. 120 capsule 0  . fentaNYL (DURAGESIC) 50 MCG/HR Place onto the skin.    Marland Kitchen HYDROcodone-acetaminophen (NORCO) 10-325 MG tablet Take 1 tablet by mouth every 3 (three) hours as needed. For cancer related pain Dx: Dannielle Huh (Patient not taking: Reported on 10/08/2018) 120 tablet 0  . ondansetron (ZOFRAN) 8 MG tablet TAKE 1 TABLET BY MOUTH EVERY 8 HOURS AS NEEDED FOR NAUSEA (Patient not taking: Reported on 09/13/2018)      Allergies:  Allergies  Allergen Reactions  . Zytiga [Abiraterone] Diarrhea    Family History: Family History  Problem Relation Age of Onset  . Cancer Mother   . Hypertension Mother   . Diabetes Father   . Heart disease Father   . Cancer Paternal Grandfather   . Prostate cancer Neg Hx   . Bladder Cancer Neg Hx   . Kidney cancer Neg Hx     Social History:  reports that he has been smoking cigars. He has a 24.00 pack-year smoking history. He has never used smokeless tobacco. He reports that he does not drink alcohol or use drugs.  ROS: Constitutional: Negative.  Negative for chills, diaphoresis, fever, malaise/fatigue and weight loss (up 6 pounds since 07/26/2018).       Feels "ok".  HENT: Negative.  Negative for congestion, ear discharge, ear pain, nosebleeds, sinus pain, sore throat and tinnitus.   Eyes: Negative.  Negative for blurred vision, double vision, photophobia and pain.  Respiratory: Negative.  Negative for  cough, hemoptysis, sputum production and shortness of breath.   Cardiovascular: Negative.  Negative for chest pain, palpitations, orthopnea and PND.  Gastrointestinal: Negative.  Negative for abdominal pain, blood in stool, constipation, diarrhea, heartburn, melena, nausea and vomiting.  Genitourinary: Negative for dysuria, flank pain, frequency, hematuria and urgency.  Musculoskeletal: Negative for falls, joint pain and myalgias.       Bone pain.  Skin: Negative.  Negative for itching and rash.  Neurological: Positive for weakness (generalized). Negative for dizziness, tremors, sensory change, speech change, focal weakness and headaches.  Endo/Heme/Allergies: Negative.  Does not bruise/bleed easily.  Psychiatric/Behavioral: Negative.  Negative for depression and memory loss. The patient is not nervous/anxious and does not have insomnia.    Physical Exam: BP (!) 180/119   Pulse (!) 101   Temp 97.8 F (36.6 C) (Tympanic)   Resp 12   Ht 5\' 11"  (1.803 m)   Wt 73.5 kg   SpO2 99%   BMI 22.60 kg/m   Constitutional:  Alert and oriented, No acute distress. HEENT: [place] AT, moist mucus membranes.  Trachea midline, no masses. Cardiovascular: No clubbing, cyanosis, or edema.  RRR Respiratory: Normal respiratory effort, no increased work of breathing.  Clear GI: Abdomen is soft, nontender, nondistended, no abdominal masses GU: No CVA tenderness Lymph: No cervical or inguinal lymphadenopathy. Skin: No rashes, bruises or suspicious lesions. Neurologic: Grossly intact, no focal deficits, moving all 4 extremities. Psychiatric: Normal mood and affect.   Assessment & Plan:   Mr. Wamser presents for bilateral ureteral stent exchange.  The procedure was cussed in detail including potential risks of bleeding, infection/sepsis.  He indicated all questions were answered and desires to proceed.  [name], [place] [address], [place], [place]  [id number] [phone]

## 2018-11-14 NOTE — Anesthesia Post-op Follow-up Note (Signed)
Anesthesia QCDR form completed.        

## 2018-11-14 NOTE — Discharge Instructions (Signed)
DISCHARGE INSTRUCTIONS FOR KIDNEY STONE/URETERAL STENT   MEDICATIONS:  1. Resume all your other meds from home.  2.  AZO (over-the-counter) can help with the burning/stinging when you urinate.   ACTIVITY:  1. May resume regular activities in 24 hours. 2. No driving while on narcotic pain medications  3. Drink plenty of water  4. Continue to walk at home - you can still get blood clots when you are at home, so keep active, but don't over do it.  5. May return to work/school tomorrow or when you feel ready   BATHING:  1. You can shower.  SIGNS/SYMPTOMS TO CALL:  Please call us if you have a fever greater than 101.5, uncontrolled nausea/vomiting, uncontrolled pain, dizziness, unable to urinate, bloody urine, chest pain, shortness of breath, leg swelling, leg pain, or any other concerns or questions.   You can reach [place] at [phone].   FOLLOW-UP:  1. You will be contacted regarding a follow-up appointment approximately 4 months.

## 2018-11-14 NOTE — Op Note (Signed)
Preoperative diagnosis:  1. Bilateral ureteral obstruction secondary to metastatic castrate resistant prostate cancer  Postoperative diagnosis:  1. Same  Procedure:  1. Cystoscopy with bilateral stent removal 2. Bilateral ureteral stent exchange 3. Bilateral retrograde pyelography with interpretation   Surgeon: Nicki Reaper C. Elizet Kaplan, M.D.  Anesthesia: General  Complications: None  Intraoperative findings: 1. Right retrograde pyelogram showed severe right hydronephrosis/proximal hydroureter.  2. Left retrograde pyelogram with moderate proximal ureteral dilation and  moderate hydronephrosis.   EBL: Minimal  Specimens: None  Indication: Michael Small is a 63 y.o. patient with bilateral ureteral obstruction secondary to metastatic castrate resistant prostate cancer.  His stents were last exchanged in October 2019.  After reviewing the management options for treatment, he elected to proceed with the above surgical procedure(s). We have discussed the potential benefits and risks of the procedure, side effects of the proposed treatment, the likelihood of the patient achieving the goals of the procedure, and any potential problems that might occur during the procedure or recuperation. Informed consent has been obtained.  Description of procedure:  The patient was taken to the operating room and general anesthesia was induced.  The patient was placed in the dorsal lithotomy position, prepped and draped in the usual sterile fashion, and preoperative antibiotics were administered. A preoperative time-out was performed.   A 21 French cystoscope was lubricated and passed per urethra.  The urethra was normal in caliber without stricture.  Prostate demonstrated mild lateral lobe enlargement but was not occlusive and there was no abnormal appearing tissue present.  Panendoscopy was performed and there  was bullous edema noted around the ureteral orifices bilaterally secondary to the indwelling  stents. No other mucosal abnormalities, solid or papillary lesions were noted.  The left ureteral stent was grasped with endoscopic forceps and brought out to the urethral meatus.  A 0.038 sensor wire however would not advance through the stent.  The cystoscope was repassed and the sensor wire was advanced into the left ureteral orifice alongside the stent and advanced to the left renal pelvis under fluoroscopic guidance.  The cystoscope was removed.  The previous stent was removed.  A 5 French open-ended ureteral catheter was placed over the guidewire and retrograde pyelogram performed with findings as described above.  Guidewire was replaced and the ureteral catheter was removed.  The guidewire was backloaded on the cystoscope which was repassed.  A 6 French/24 cm Bard Optima ureteral stent was advanced over the wire without difficulty.  There was a good curl seen in the renal pelvis under fluoroscopy and the distal end of the stent was well-positioned in the bladder.  Attention was directed to the right ureteral orifice.  The sensor wire was placed through the cystoscope into the orifice and advanced to the renal pelvis.  The cystoscope was removed and repassed.  The indwelling right ureteral stent was grasped with endoscopic forceps and removed under fluoroscopic guidance.  Right retrograde pyelogram was performed in a similar fashion with findings as described above.  A 6 French/24 cm Bard Optima ureteral stent was placed in a similar fashion to the contralateral side.  The bladder was emptied and cystoscope was removed.  After anesthetic reversal of the patient was transported to the PACU in stable condition.   Michael Giovanni, MD

## 2018-11-14 NOTE — Transfer of Care (Signed)
Immediate Anesthesia Transfer of Care Note  Patient: Michael Small  Procedure(s) Performed: CYSTOSCOPY WITH STENT REPLACEMENT (Bilateral Ureter) CYSTOSCOPY WITH RETROGRADE PYELOGRAM (Bilateral Ureter)  Patient Location: PACU  Anesthesia Type:General  Level of Consciousness: awake  Airway & Oxygen Therapy: Patient Spontanous Breathing  Post-op Assessment: Report given to RN  Post vital signs: Reviewed  Last Vitals:  Vitals Value Taken Time  BP    Temp    Pulse    Resp 13 11/14/18 0944  SpO2    Vitals shown include unvalidated device data.  Last Pain:  Vitals:   11/14/18 0701  TempSrc: Tympanic  PainSc: 0-No pain         Complications: No apparent anesthesia complications

## 2018-11-14 NOTE — Anesthesia Procedure Notes (Signed)
Procedure Name: LMA Insertion Date/Time: 11/14/2018 9:07 AM Performed by: Timoteo Expose, CRNA Pre-anesthesia Checklist: Patient identified, Emergency Drugs available, Suction available, Patient being monitored and Timeout performed Patient Re-evaluated:Patient Re-evaluated prior to induction Oxygen Delivery Method: Circle system utilized Preoxygenation: Pre-oxygenation with 100% oxygen Induction Type: IV induction Ventilation: Mask ventilation without difficulty LMA: LMA inserted LMA Size: 4.0

## 2018-11-15 NOTE — Anesthesia Postprocedure Evaluation (Signed)
Anesthesia Post Note  Patient: Michael Small  Procedure(s) Performed: CYSTOSCOPY WITH STENT REPLACEMENT (Bilateral Ureter) CYSTOSCOPY WITH RETROGRADE PYELOGRAM (Bilateral Ureter)  Patient location during evaluation: PACU Anesthesia Type: General Level of consciousness: awake and alert Pain management: pain level controlled Vital Signs Assessment: post-procedure vital signs reviewed and stable Respiratory status: spontaneous breathing, nonlabored ventilation, respiratory function stable and patient connected to nasal cannula oxygen Cardiovascular status: blood pressure returned to baseline and stable Postop Assessment: no apparent nausea or vomiting Anesthetic complications: no     Last Vitals:  Vitals:   11/14/18 1013 11/14/18 1019  BP: (!) 165/64 (!) 174/68  Pulse: 78 85  Resp: 15 16  Temp:  (!) 36.4 C  SpO2: 98% 100%    Last Pain:  Vitals:   11/14/18 1019  TempSrc: Temporal                 Martha Clan

## 2018-11-19 ENCOUNTER — Other Ambulatory Visit: Payer: Self-pay

## 2018-11-20 ENCOUNTER — Telehealth: Payer: Self-pay | Admitting: Family Medicine

## 2018-11-20 ENCOUNTER — Other Ambulatory Visit: Payer: Medicare Other

## 2018-11-20 ENCOUNTER — Inpatient Hospital Stay: Payer: Medicare Other

## 2018-11-20 ENCOUNTER — Other Ambulatory Visit: Payer: Self-pay

## 2018-11-20 DIAGNOSIS — R197 Diarrhea, unspecified: Secondary | ICD-10-CM | POA: Diagnosis not present

## 2018-11-20 DIAGNOSIS — M898X9 Other specified disorders of bone, unspecified site: Secondary | ICD-10-CM | POA: Diagnosis not present

## 2018-11-20 DIAGNOSIS — R5383 Other fatigue: Secondary | ICD-10-CM | POA: Diagnosis not present

## 2018-11-20 DIAGNOSIS — Z79899 Other long term (current) drug therapy: Secondary | ICD-10-CM | POA: Diagnosis not present

## 2018-11-20 DIAGNOSIS — C61 Malignant neoplasm of prostate: Secondary | ICD-10-CM | POA: Diagnosis not present

## 2018-11-20 DIAGNOSIS — B9561 Methicillin susceptible Staphylococcus aureus infection as the cause of diseases classified elsewhere: Secondary | ICD-10-CM | POA: Diagnosis not present

## 2018-11-20 DIAGNOSIS — N390 Urinary tract infection, site not specified: Secondary | ICD-10-CM | POA: Diagnosis not present

## 2018-11-20 DIAGNOSIS — C7951 Secondary malignant neoplasm of bone: Secondary | ICD-10-CM | POA: Diagnosis not present

## 2018-11-20 DIAGNOSIS — M898X6 Other specified disorders of bone, lower leg: Secondary | ICD-10-CM | POA: Diagnosis not present

## 2018-11-20 DIAGNOSIS — E875 Hyperkalemia: Secondary | ICD-10-CM | POA: Diagnosis not present

## 2018-11-20 DIAGNOSIS — Z8249 Family history of ischemic heart disease and other diseases of the circulatory system: Secondary | ICD-10-CM | POA: Diagnosis not present

## 2018-11-20 DIAGNOSIS — M79606 Pain in leg, unspecified: Secondary | ICD-10-CM | POA: Diagnosis not present

## 2018-11-20 DIAGNOSIS — G893 Neoplasm related pain (acute) (chronic): Secondary | ICD-10-CM | POA: Diagnosis not present

## 2018-11-20 DIAGNOSIS — Z833 Family history of diabetes mellitus: Secondary | ICD-10-CM | POA: Diagnosis not present

## 2018-11-20 DIAGNOSIS — R590 Localized enlarged lymph nodes: Secondary | ICD-10-CM | POA: Diagnosis not present

## 2018-11-20 DIAGNOSIS — Z809 Family history of malignant neoplasm, unspecified: Secondary | ICD-10-CM | POA: Diagnosis not present

## 2018-11-20 DIAGNOSIS — R112 Nausea with vomiting, unspecified: Secondary | ICD-10-CM | POA: Diagnosis not present

## 2018-11-20 DIAGNOSIS — I129 Hypertensive chronic kidney disease with stage 1 through stage 4 chronic kidney disease, or unspecified chronic kidney disease: Secondary | ICD-10-CM | POA: Diagnosis not present

## 2018-11-20 DIAGNOSIS — M2550 Pain in unspecified joint: Secondary | ICD-10-CM | POA: Diagnosis not present

## 2018-11-20 DIAGNOSIS — N400 Enlarged prostate without lower urinary tract symptoms: Secondary | ICD-10-CM | POA: Diagnosis not present

## 2018-11-20 DIAGNOSIS — F17299 Nicotine dependence, other tobacco product, with unspecified nicotine-induced disorders: Secondary | ICD-10-CM | POA: Diagnosis not present

## 2018-11-20 LAB — CBC WITH DIFFERENTIAL/PLATELET
Abs Immature Granulocytes: 0.01 10*3/uL (ref 0.00–0.07)
Basophils Absolute: 0.1 10*3/uL (ref 0.0–0.1)
Basophils Relative: 1 %
Eosinophils Absolute: 0.2 10*3/uL (ref 0.0–0.5)
Eosinophils Relative: 4 %
HCT: 35.2 % — ABNORMAL LOW (ref 39.0–52.0)
Hemoglobin: 11.8 g/dL — ABNORMAL LOW (ref 13.0–17.0)
Immature Granulocytes: 0 %
Lymphocytes Relative: 34 %
Lymphs Abs: 1.9 10*3/uL (ref 0.7–4.0)
MCH: 31.6 pg (ref 26.0–34.0)
MCHC: 33.5 g/dL (ref 30.0–36.0)
MCV: 94.1 fL (ref 80.0–100.0)
Monocytes Absolute: 0.6 10*3/uL (ref 0.1–1.0)
Monocytes Relative: 11 %
Neutro Abs: 2.8 10*3/uL (ref 1.7–7.7)
Neutrophils Relative %: 50 %
Platelets: 296 10*3/uL (ref 150–400)
RBC: 3.74 MIL/uL — ABNORMAL LOW (ref 4.22–5.81)
RDW: 13.5 % (ref 11.5–15.5)
WBC: 5.5 10*3/uL (ref 4.0–10.5)
nRBC: 0 % (ref 0.0–0.2)

## 2018-11-20 LAB — COMPREHENSIVE METABOLIC PANEL
ALT: 7 U/L (ref 0–44)
AST: 13 U/L — ABNORMAL LOW (ref 15–41)
Albumin: 3.5 g/dL (ref 3.5–5.0)
Alkaline Phosphatase: 328 U/L — ABNORMAL HIGH (ref 38–126)
Anion gap: 13 (ref 5–15)
BUN: 35 mg/dL — ABNORMAL HIGH (ref 8–23)
CO2: 21 mmol/L — ABNORMAL LOW (ref 22–32)
Calcium: 9.2 mg/dL (ref 8.9–10.3)
Chloride: 105 mmol/L (ref 98–111)
Creatinine, Ser: 2.5 mg/dL — ABNORMAL HIGH (ref 0.61–1.24)
GFR calc Af Amer: 31 mL/min — ABNORMAL LOW (ref >60)
GFR calc non Af Amer: 26 mL/min — ABNORMAL LOW (ref >60)
Glucose, Bld: 106 mg/dL — ABNORMAL HIGH (ref 70–99)
Potassium: 4.9 mmol/L (ref 3.5–5.1)
Sodium: 139 mmol/L (ref 135–145)
Total Bilirubin: 0.3 mg/dL (ref 0.3–1.2)
Total Protein: 7.4 g/dL (ref 6.5–8.1)

## 2018-11-20 LAB — PSA: Prostatic Specific Antigen: 219 ng/mL — ABNORMAL HIGH (ref 0.00–4.00)

## 2018-11-20 NOTE — Chronic Care Management (AMB) (Signed)
Chronic Care Management   Note  11/20/2018 Name: Michael Small MRN: [id number] DOB: Mar 26, 1956  Michael Small is a 63 y.o. year old male who is a primary care patient of Caryn Section, [name], MD. I reached out to Dan Maker by phone today in response to a referral sent by Michael Small health plan.    Mr. Delair was given information about Chronic Care Management services today including:  1. CCM service includes personalized support from designated clinical staff supervised by his physician, including individualized plan of care and coordination with other care providers 2. 24/7 contact phone numbers for assistance for urgent and routine care needs. 3. Service will only be billed when office clinical staff spend 20 minutes or more in a month to coordinate care. 4. Only one practitioner may furnish and bill the service in a calendar month. 5. The patient may stop CCM services at any time (effective at the end of the month) by phone call to the office staff. 6. The patient will be responsible for cost sharing (co-pay) of up to 20% of the service fee (after annual deductible is met).  Patient did not agree to enrollment in care management services and does not wish to consider at this time.  Follow up plan: The patient has been provided with contact information for the chronic care management team and has been advised to call with any health related questions or concerns.   [place]  [email or web address]   ??[phone]

## 2018-11-20 NOTE — Progress Notes (Signed)
[facility]  [address] [place], [place] [address] Phone: [phone]  Fax: [phone]   Telemedicine Office Visit:  11/21/2018  Referring physician: Birdie Sons, MD  I connected with Michael Small on 11/21/2018 at 4:03 PM by videoconferencing and verified that I was speaking with the correct person using 2 identifiers.  The patient was at home.  I discussed the limitations, risk, security and privacy concerns of performing an evaluation and management service by videoconferencing and the availability of in person appointments.  I also discussed with the patient that there may be a patient responsible charge related to this service.  The patient expressed understanding and agreed to proceed.   Chief Complaint: Michael Small is a 63 y.o. male  withmetastatic prostate cancerfor assessment onXtandi.  HPI: The patient was last seen in the medical oncology clinic on 10/29/2018 via telemedicine. At that time, he was tolerating Xtandi well.  He had been on full dose Xtandi for 2 weeks.  PSA was 113.85 (improved).  He saw his urologist, Dr. John Giovanni, for cytoscopy with stent replacement (bilateral), and cystoscopy with retograde pyleogram (bilateral) on 11/14/2018.   Labs followed: 11/09/2018: COVID-19 negative. 11/20/2018: WBC 5,500, hemoglobin 11.8, hematocrit 35.2, platelets 296,000.  PSA was 219.00.  BUN 35 and creatinine 2.50.   During the interim, he states  His pain is "so-so".  He reports having pain in the back of his head on the right and pain in his arms since surgery. He has bone and joint pain. He is voiding well.  He denies any new symptoms.   He is not interested in chemotherapy, or talking to someone who has experienced similar treatments. His wife reports he is worried about not being able to handle chemotherapy, and not being able to function.  He continues 160 mg Xtandi daily.    Past Medical History:  Diagnosis Date  .  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 Depression   . GERD (gastroesophageal reflux disease)    occ   . Headache    migraines occ  . Hyperlipidemia   . Hypertension   . Myocardial infarction [facility])    s/p stent  . Neuromuscular disorder (HCC)    numbness in arms and legs  . Prostate cancer [facility])     Past Surgical History:  Procedure Laterality Date  . CORONARY ANGIOPLASTY WITH STENT PLACEMENT  12/2011   [facility]  . CYSTOSCOPY W/ RETROGRADES Bilateral 05/24/2017   Procedure: CYSTOSCOPY WITH RETROGRADE PYELOGRAM;  Surgeon: [name], MD;  Location: [facility] ORS;  Service: Urology;  Laterality: Bilateral;  . CYSTOSCOPY W/ RETROGRADES Bilateral 11/21/2017   Procedure: CYSTOSCOPY WITH RETROGRADE PYELOGRAM;  Surgeon: [name], MD;  Location: [facility];  Service: Urology;  Laterality: Bilateral;  . CYSTOSCOPY W/ RETROGRADES Bilateral 03/20/2018   Procedure: CYSTOSCOPY WITH RETROGRADE PYELOGRAM;  Surgeon: [name], MD;  Location: [facility];  Service: Urology;  Laterality: Bilateral;  . CYSTOSCOPY W/ RETROGRADES Bilateral 11/14/2018   Procedure: CYSTOSCOPY WITH RETROGRADE PYELOGRAM;  Surgeon: [name], MD;  Location: [facility];  Service: Urology;  Laterality: Bilateral;  . CYSTOSCOPY W/ URETERAL STENT PLACEMENT Bilateral 11/21/2017   Procedure: CYSTOSCOPY WITH STENT REPLACEMENT;  Surgeon: [name], MD;  Location: [facility];  Service: Urology;  Laterality: Bilateral;  . CYSTOSCOPY W/ URETERAL STENT PLACEMENT Bilateral 11/14/2018   Procedure: CYSTOSCOPY WITH STENT REPLACEMENT;  Surgeon: [name], MD;  Location: [facility];  Service: Urology;  Laterality: Bilateral;  . CYSTOSCOPY W/ URETERAL STENT REMOVAL Bilateral 11/21/2017   Procedure: CYSTOSCOPY WITH STENT REMOVAL;  Surgeon: [name], MD;  Location: [facility];  Service: Urology;  Laterality: Bilateral;  . CYSTOSCOPY WITH STENT PLACEMENT Right 05/24/2017   Procedure:  CYSTOSCOPY WITH STENT PLACEMENT;  Surgeon: [name], MD;  Location: [facility];  Service: Urology;  Laterality: Right;  . CYSTOSCOPY WITH STENT PLACEMENT Bilateral 03/20/2018   Procedure: CYSTOSCOPY WITH STENT EXCHANGE;  Surgeon: [name], MD;  Location: [facility];  Service: Urology;  Laterality: Bilateral;  . IR NEPHROSTOGRAM LEFT THRU EXISTING ACCESS  07/21/2017  . IR NEPHROSTOGRAM LEFT THRU EXISTING ACCESS  08/18/2017  . IR NEPHROSTOGRAM RIGHT THRU EXISTING ACCESS  08/18/2017  . IR NEPHROSTOMY PLACEMENT LEFT  06/15/2017  . IR NEPHROSTOMY PLACEMENT RIGHT  06/14/2017  . IR URETERAL STENT PLACEMENT EXISTING ACCESS LEFT  07/21/2017  . PICC LINE INSERTION N/A 08/02/2017   Procedure: PICC LINE INSERTION;  Surgeon: Katha Cabal, MD;  Location: [facility];  Service: Cardiovascular;  Laterality: N/A;  . PROSTATE BIOPSY N/A 05/24/2017   Procedure: PROSTATE BIOPSY;  Surgeon: [name], MD;  Location: [facility];  Service: Urology;  Laterality: N/A;  . TEE WITHOUT CARDIOVERSION N/A 07/31/2017   Procedure: TRANSESOPHAGEAL ECHOCARDIOGRAM (TEE);  Surgeon: Teodoro Spray, MD;  Location: [facility];  Service: Cardiovascular;  Laterality: N/A;  . US ECHOCARDIOGRAPHY  12/2011   Mild LV dysfunction, EF=45%    Family History  Problem Relation Age of Onset  . Cancer Mother   . Hypertension Mother   . Diabetes Father   . Heart disease Father   . Cancer Paternal Grandfather   . Prostate cancer Neg Hx   . Bladder Cancer Neg Hx   . Kidney cancer Neg Hx     Social History:  reports that he has been smoking cigars. He has a 24.00 pack-year smoking history. He has never used smokeless tobacco. He reports that he does not drink alcohol or use drugs. He has smoked 1 pack per day since age 59 (57 years). He smokes small cigars. He does not drink alcohol, but previously drank heavily. He describes being exposed to Round Up and asbestos. He has been disabled for several years secondary to  "nerve damage and problems in his legs, arms, and bad back". He fell off a ladder. He previously worked for a [employer], [employer]. He lives in [place]. He has step children.The patient is accompanied by his wife today.  Participants in the patient's visit and their role in the encounter included the patient, his wife, and Vito Berger, CMA, today. The intake visit was provided by Vito Berger, CMA.  Allergies:  Allergies  Allergen Reactions  . Zytiga [Abiraterone] Diarrhea    Current Medications: Current Outpatient Medications  Medication Sig Dispense Refill  . amLODipine (NORVASC) 10 MG tablet Take 1 tablet (10 mg total) by mouth daily. (Patient taking differently: Take 10 mg by mouth every morning. ) 90 tablet 3  . buPROPion (WELLBUTRIN SR) 150 MG 12 hr tablet Take 1 tablet (150 mg total) by mouth 2 (two) times daily. 180 tablet 4  . Ca Carbonate-Mag Hydroxide (ROLAIDS PO) Take 1 tablet by mouth as needed.    . diphenoxylate-atropine (LOMOTIL) 2.5-0.025 MG tablet Take 1 tablet by mouth 4 (four) times daily as needed for diarrhea or loose stools. 120 tablet 3  . HYDROcodone-acetaminophen (NORCO) 10-325 MG tablet Take 1  tablet by mouth every 3 (three) hours as needed. For cancer related pain Dx: C61 (Patient taking differently: Take 1 tablet by mouth 4 (four) times daily. For cancer related pain Dx: C61) 120 tablet 0  . Oxycodone HCl 10 MG TABS One tablet every six hours scheduled, and one every three hours as needed for breakthrough pain (Patient taking differently: Take 10 mg by mouth 4 (four) times daily. ) 120 tablet 0  . XTANDI 40 MG capsule TAKE 4 CAPSULES (160 MG TOTAL) BY MOUTH DAILY. (Patient taking differently: Take 640 mg by mouth every morning. ) 120 capsule 0   No current facility-administered medications for this visit.      Review of Systems  Constitutional: Positive for malaise/fatigue (chronic). Negative for chills, fever and weight loss.       "I am in  pain."  HENT: Negative for congestion, hearing loss, nosebleeds, sinus pain, sore throat and tinnitus.   Eyes: Negative.  Negative for blurred vision, double vision, photophobia and pain.  Respiratory: Negative for cough, hemoptysis and shortness of breath.   Cardiovascular: Negative.  Negative for chest pain, palpitations and leg swelling.  Gastrointestinal: Negative.  Negative for abdominal pain, constipation, diarrhea, heartburn, nausea and vomiting.  Genitourinary: Negative for dysuria, frequency and urgency.       Nephrostomy tube exchange in 10/2018.  Musculoskeletal: Positive for joint pain (bone and leg pain) and myalgias (arms).       Pain on right side of skull.  Skin: Negative.  Negative for itching and rash.  Neurological: Positive for tremors (involuntary movements in bilateral legs) and weakness (generalized). Negative for dizziness, sensory change and headaches.  Endo/Heme/Allergies: Does not bruise/bleed easily.  Psychiatric/Behavioral: Negative.  Negative for depression and memory loss. The patient is not nervous/anxious and does not have insomnia.   All other systems reviewed and are negative.   Performance status (ECOG): 2  There were no vitals taken for this visit.  Physical Exam  Constitutional: He is oriented to person, place, and time.  Chronically fatigued appearing gentleman sitting at home in no acute distress.  HENT:  Head: Normocephalic and atraumatic.  Near alopecia.  Graying light brown beard.  Eyes: Conjunctivae and EOM are normal. No scleral icterus.  Neurological: He is alert and oriented to person, place, and time. He has normal reflexes.  Skin: He is not diaphoretic. No pallor.  Psychiatric: He has a normal mood and affect. His behavior is normal. Judgment and thought content normal.  Nursing note reviewed.   Appointment on 11/20/2018  Component Date Value Ref Range Status  . Prostatic Specific Antigen 11/20/2018 219.00* 0.00 - 4.00 ng/mL Final    Comment: (NOTE) While PSA levels of <=4.0 ng/ml are reported as reference range, some men with levels below 4.0 ng/ml can have prostate cancer and many men with PSA above 4.0 ng/ml do not have prostate cancer.  Other tests such as free PSA, age specific reference ranges, PSA velocity and PSA doubling time may be helpful especially in men less than 58 years old. Performed at [facility], [place] [address] [place], [place] [address]   . Sodium 11/20/2018 139  135 - 145 mmol/L Final  . Potassium 11/20/2018 4.9  3.5 - 5.1 mmol/L Final  . Chloride 11/20/2018 105  98 - 111 mmol/L Final  . CO2 11/20/2018 21* 22 - 32 mmol/L Final  . Glucose, Bld 11/20/2018 106* 70 - 99 mg/dL Final  . BUN 11/20/2018 35* 8 - 23 mg/dL Final  .  Creatinine, Ser 11/20/2018 2.50* 0.61 - 1.24 mg/dL Final  . Calcium 11/20/2018 9.2  8.9 - 10.3 mg/dL Final  . Total Protein 11/20/2018 7.4  6.5 - 8.1 g/dL Final  . Albumin 11/20/2018 3.5  3.5 - 5.0 g/dL Final  . AST 11/20/2018 13* 15 - 41 U/L Final  . ALT 11/20/2018 7  0 - 44 U/L Final  . Alkaline Phosphatase 11/20/2018 328* 38 - 126 U/L Final  . Total Bilirubin 11/20/2018 0.3  0.3 - 1.2 mg/dL Final  . GFR calc non Af Amer 11/20/2018 26* >60 mL/min Final  . GFR calc Af Amer 11/20/2018 31* >60 mL/min Final  . Anion gap 11/20/2018 13  5 - 15 Final   Performed at [facility], [address], [place], [place] [address]  . WBC 11/20/2018 5.5  4.0 - 10.5 K/uL Final  . RBC 11/20/2018 3.74* 4.22 - 5.81 MIL/uL Final  . Hemoglobin 11/20/2018 11.8* 13.0 - 17.0 g/dL Final  . HCT 11/20/2018 35.2* 39.0 - 52.0 % Final  . MCV 11/20/2018 94.1  80.0 - 100.0 fL Final  . MCH 11/20/2018 31.6  26.0 - 34.0 pg Final  . MCHC 11/20/2018 33.5  30.0 - 36.0 g/dL Final  . RDW 11/20/2018 13.5  11.5 - 15.5 % Final  . Platelets 11/20/2018 296  150 - 400 K/uL Final  . nRBC 11/20/2018 0.0  0.0 - 0.2 % Final  . Neutrophils Relative % 11/20/2018 50  % Final  . Neutro Abs 11/20/2018 2.8   1.7 - 7.7 K/uL Final  . Lymphocytes Relative 11/20/2018 34  % Final  . Lymphs Abs 11/20/2018 1.9  0.7 - 4.0 K/uL Final  . Monocytes Relative 11/20/2018 11  % Final  . Monocytes Absolute 11/20/2018 0.6  0.1 - 1.0 K/uL Final  . Eosinophils Relative 11/20/2018 4  % Final  . Eosinophils Absolute 11/20/2018 0.2  0.0 - 0.5 K/uL Final  . Basophils Relative 11/20/2018 1  % Final  . Basophils Absolute 11/20/2018 0.1  0.0 - 0.1 K/uL Final  . Immature Granulocytes 11/20/2018 0  % Final  . Abs Immature Granulocytes 11/20/2018 0.01  0.00 - 0.07 K/uL Final   Performed at [facility], [address] [place], [place] [address]    Assessment:  Michael Small is a 63 y.o. male with metastaticprostate cancers/p transrectal prostate biopsies and cystoscopy on 05/24/2017. Prostate biopsies revealed acinar adenocarcinoma involving all 12 cores. Adenocarcinoma involved colonic mucosa and skeletal muscle. Gleason score was 9 (4+5), grade group 5. Pathologic stagewas [id number]. PSAwas 2338.8 on 04/24/2017.  Abdomen and pelvic CT on 04/27/2017 revealed prominent pelvic adenopathy with pathologic retroperitoneal adenopathy, abnormal retroperitoneal stranding, and abnormal inflammatory type stranding along the pelvic sidewalls and perirectal space. A representative right external iliac lymph node measured 3.2 cm. A left external iliac lymph node measured 2.1 cm. There was conglomerate left periaortic adenopathy 2.1 cm and a 1.4 cm aortocaval node. There was abnormal adenopathy in the perirectal spacewith prominent wall thickening in the rectumand moderate wall thickening in the sigmoid colon. The findings along the pelvic sidewalls appeared to be causingdistal ureteral obstructionresulting in moderatebilateral hydronephrosisand hydroureter. Lymphoma or prostate cancer was favored. A right external iliac node measured 3.2 cm. There was bladder wall thickeningsuggesting cystitis. There was  prostatomegaly.  Bone scanon 06/22/2017 revealed findings consistent with diffuse metastatic disease. There was activity in right temporal area, sternum, anterior and posterior ribs bilaterally, and entire spine.  He received Lupron 22.5 mg every 3 months (06/01/2017;  10/02/2017; last04/27/2020) and Degarelix240 mg on 06/15/2017. He received Casodexfrom 06/13/2017 - 06/15/2017.   He began abirateroneon 02/28/2018 and discontinued after 2 days secondary to nausea, vomiting, diarrhea, and fatigue. He restarted abiraterone(1 pill/day) on 03/16/2018 and increased to 2 pills/day on 03/23/2018. He increased to 4 tablets in mid 03/2018. He began enzalutamide on 10/08/2018.  He has been on full dose enzalutamide since 10/17/2018.  Testosteronewas 98 on 06/14/2017, <3 on 09/07/2017, and <3 on 03/16/2018.  PSAhas been followed: 2338.8 on 04/24/2017, 3603.0 on 06/23/2017, 1035 on 07/26/2017, 241 on 09/07/2017, 342 on 01/23/2018, 107.14 on 04/24/2018, 116.03 on 05/22/2018, 116.52 on 06/27/2018, 101.37 on 07/26/2018, 180.0 on 08/28/2018, 113.85 on 10/26/2018, and 219 on 11/20/2018.  Abdomen and pelvic CTon 09/11/2018 revealed no acute findings.Bone scanon 09/11/2018 revealed worsening bony metastatic diseasein theright humerus, left humerus, right side of the calvarium, the left side of the manubrium skin, and in the bilateral femurs.There was persistent metastatic disease in the pelvic bones, sternum, andspine.There wassubtle persistent metastases in the posterior ribs bilaterally. Themetastasis in a medial left posterior rib wasmore prominent.The lack of soft tissue uptakewasconsistent with asuper scandue to the number metastases.  He was admitted to [facility] 06/12/2017 - 06/15/2017 with hyperkalemia and acute renal failure. Creatinine was 7.23. He underwent emergent dialysis x1. He is s/p bilateral external nephrostomy tubeplacement on 06/14/2017(last stent exchange  11/14/2018).  He was admitted to [facility] 07/28/2017 - 07/31/2017 with sepsis secondary to UTI with MSSA bacteremia.   Bilateral lower extremity duplexon 06/12/2017 revealed no evidence of DVT. Code statusis DNR/DNI.  Symptomatically, he continues to have bone pain.  He is not interested in chemotherapy.  Plan: 1.   Review labs from 11/20/2018. 2.Metastatic prostate cancer Clinically, patient continues to have bone pain.  He has extensive bony metastasis. He has been on full dose enzalutamide since 10/17/2018.             PSA has correlated with disease status.                           PSA was 180 on 08/28/2018.                         PSA was 113.85 on 10/26/2018.   PSA was 219 on 11/20/2018.             Discuss consideration of chemotherapy (cabazitaxel).   Information provided.   Patient declines today.   Encourage patient to consider further. Continue Lupron every 3 months (last 09/24/2018).  Consider next generation sequencing for potential other options. 3.Renal insufficiency Creatinine 2.50. Patient is status post bilateral stent exchange on 11/14/2018. 4.   Cancer-related pain Patient  is taking oxycodone and hydrocodone/acetaminophen.       Pain medications are provided by Dr. Caryn Section. Patient requesting rescheduling appointment with palliative care, [name], NP, in a few months. 5.     RTC on 12/17/2018 for MD assessment, labs (CBC with diff, CMP, PSA) and Lupron.  I discussed the assessment and treatment plan with the patient.  The patient was provided an opportunity to ask questions and all were answered.  The patient agreed with the plan and demonstrated an understanding of the instructions.  The patient was advised to call back or seek an in person evaluation if the symptoms worsen or if the condition fails to improve as anticipated.  I provided 17 minutes (4:03 PM - 4:20 PM) of face-to-face video  visit  time during this this encounter and > 50% was spent counseling as documented under my assessment and plan.  I provided these services from the [facility] office.   Nolon Stalls, MD, PhD  11/21/2018, 4:03 PM  I, Selena Batten, am acting as scribe for [employer]. Mike Gip, MD, PhD.  I, Melissa C. Mike Gip, MD, have reviewed the above documentation for accuracy and completeness, and I agree with the above.

## 2018-11-21 ENCOUNTER — Encounter: Payer: Self-pay | Admitting: Hematology and Oncology

## 2018-11-21 ENCOUNTER — Inpatient Hospital Stay (HOSPITAL_BASED_OUTPATIENT_CLINIC_OR_DEPARTMENT_OTHER): Payer: Medicare Other | Admitting: Hematology and Oncology

## 2018-11-21 ENCOUNTER — Other Ambulatory Visit: Payer: Medicare Other

## 2018-11-21 DIAGNOSIS — C61 Malignant neoplasm of prostate: Secondary | ICD-10-CM

## 2018-11-21 DIAGNOSIS — Z791 Long term (current) use of non-steroidal anti-inflammatories (NSAID): Secondary | ICD-10-CM

## 2018-11-21 DIAGNOSIS — C7951 Secondary malignant neoplasm of bone: Secondary | ICD-10-CM | POA: Diagnosis not present

## 2018-11-21 DIAGNOSIS — G893 Neoplasm related pain (acute) (chronic): Secondary | ICD-10-CM | POA: Diagnosis not present

## 2018-11-21 NOTE — Progress Notes (Signed)
Patient c/o diarrhea x 1-2 week and wasn't  unable to go see NP, Jousha  For pain due to diarrhea

## 2018-11-21 NOTE — Patient Instructions (Signed)
Cabazitaxel injection What is this medicine? CABAZITAXEL (ka baz i TAX el) is a chemotherapy drug. It is used to treat prostate cancer. It targets fast dividing cells, like cancer cells, and causes these cells to die. This medicine may be used for other purposes; ask your health care provider or pharmacist if you have questions. COMMON BRAND NAME(S): Jevtana What should I tell my health care provider before I take this medicine? They need to know if you have any of these conditions: -history of stomach bleeding -kidney disease -liver disease -low blood counts, like low white cell, platelet, or red cell counts -lung or breathing disease, like asthma -recent or ongoing radiation therapy -take medicines that treat or prevent blood clots -an unusual or allergic reaction to cabazitaxel, polysorbate 80, other medicines, foods, dyes, or preservatives -pregnant or trying to get pregnant -breast-feeding How should I use this medicine? This medicine is for infusion into a vein. It is given by a health care professional in a hospital or clinic setting. Talk to your pediatrician regarding the use of this medicine in children. Special care may be needed. Overdosage: If you think you have taken too much of this medicine contact a poison control center or emergency room at once. NOTE: This medicine is only for you. Do not share this medicine with others. What if I miss a dose? It is important not to miss your dose. Call your doctor or health care professional if you are unable to keep an appointment. What may interact with this medicine? -antiviral medicines for HIV or AIDS -clarithromycin -medicines for fungal infections like ketoconazole, fluconazole, itraconazole, and voriconazole -nefazodone -telithromycin This list may not describe all possible interactions. Give your health care provider a list of all the medicines, herbs, non-prescription drugs, or dietary supplements you use. Also tell them if  you smoke, drink alcohol, or use illegal drugs. Some items may interact with your medicine. What should I watch for while using this medicine? Your condition will be monitored carefully while you are receiving this medicine. This drug may make you feel generally unwell. This is not uncommon, as chemotherapy can affect healthy cells as well as cancer cells. Report any side effects. Continue your course of treatment even though you feel ill unless your doctor tells you to stop. Call your doctor or health care professional for advice if you get a fever, chills or sore throat, or other symptoms of a cold or flu. Do not treat yourself. This drug decreases your body's ability to fight infections. Try to avoid being around people who are sick. This medicine may increase your risk to bruise or bleed. Call your doctor or health care professional if you notice any unusual bleeding. Be careful brushing and flossing your teeth or using a toothpick because you may get an infection or bleed more easily. If you have any dental work done, tell your dentist you are receiving this medicine. Avoid taking products that contain aspirin, acetaminophen, ibuprofen, naproxen, or ketoprofen unless instructed by your doctor. These medicines may hide a fever. Do not become pregnant while taking this medicine. Women should inform their doctor if they wish to become pregnant or think they might be pregnant. Men should not father a child while taking this medicine and for 3 months after stopping it. There is a potential for serious side effects to an unborn child. Talk to your health care professional or pharmacist for more information. Do not breast-feed an infant while taking this medicine. What side effects may I  notice from receiving this medicine? Side effects that you should report to your doctor or health care professional as soon as possible: -allergic reactions like skin rash, itching or hives, swelling of the face, lips, or  tongue -blood in the urine -breathing problems -constipation -dark urine -diarrhea -pain in the lower back or side -pain, tingling, numbness in the hands or feet -pain when urinating -severe abdominal pain -signs of infection - fever or chills, cough, sore throat, pain or difficulty passing urine -signs and symptoms of kidney injury like trouble passing urine or change in the amount of urine -signs of decreased platelets or bleeding - bruising, pinpoint red spots on the skin, black, tarry stools, blood in the urine -signs of decreased red blood cells - unusually weak or tired, fainting spells, lightheadedness -vomiting Side effects that usually do not require medical attention (report to your doctor or health care professional if they continue or are bothersome): -back pain -change in taste -hair loss -headache -loss of appetite -muscle or joint pain -nausea -upset stomach This list may not describe all possible side effects. Call your doctor for medical advice about side effects. You may report side effects to FDA at 1-800-FDA-1088. Where should I keep my medicine? This drug is given in a hospital or clinic and will not be stored at home. NOTE: This sheet is a summary. It may not cover all possible information. If you have questions about this medicine, talk to your doctor, pharmacist, or health care provider.  2019 Elsevier/Gold Standard (2016-06-10 15:38:47)

## 2018-12-05 ENCOUNTER — Other Ambulatory Visit: Payer: Self-pay

## 2018-12-05 DIAGNOSIS — G893 Neoplasm related pain (acute) (chronic): Secondary | ICD-10-CM

## 2018-12-05 MED ORDER — HYDROCODONE-ACETAMINOPHEN 10-325 MG PO TABS: 1.0000 | ORAL_TABLET | ORAL | 0 refills | Status: DC | PRN

## 2018-12-05 MED ORDER — OXYCODONE HCL 10 MG PO TABS: ORAL_TABLET | ORAL | 0 refills | Status: DC

## 2018-12-10 ENCOUNTER — Encounter: Payer: Self-pay | Admitting: Hematology and Oncology

## 2018-12-16 NOTE — Progress Notes (Signed)
[facility]  [address] [place], [place] [address] Phone: [phone]  Fax: [phone]   Clinic Day:  12/17/2018  Referring physician: Birdie Sons, MD  Chief Complaint: Michael Small is a 63 y.o. male withmetastatic prostate cancerwho is seen for 1 month assessmentonXtandi.  HPI: The patient was last seen in the medical oncology clinic via telemedicine on 11/21/2018. At that time, he continued to have bone pain.  He was not interested in chemotherapy. PSA was 219.  During the interim, he has been doing "okay." He notes fatigue and "not a lot of energy". His bone pain is well controlled with hydrocodone 4x daily alternating with oxycodone 4x daily.   He continues on Xtandi and denies any side effects. He will consider chemotherapy.    Past Medical History:  Diagnosis Date  . 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 Depression   . GERD (gastroesophageal reflux disease)    occ   . Headache    migraines occ  . Hyperlipidemia   . Hypertension   . Myocardial infarction [facility])    s/p stent  . Neuromuscular disorder (HCC)    numbness in arms and legs  . Prostate cancer [facility])     Past Surgical History:  Procedure Laterality Date  . CORONARY ANGIOPLASTY WITH STENT PLACEMENT  12/2011   [facility] Cardiology  . CYSTOSCOPY W/ RETROGRADES Bilateral 05/24/2017   Procedure: CYSTOSCOPY WITH RETROGRADE PYELOGRAM;  Surgeon: [name], MD;  Location: [facility] ORS;  Service: Urology;  Laterality: Bilateral;  . CYSTOSCOPY W/ RETROGRADES Bilateral 11/21/2017   Procedure: CYSTOSCOPY WITH RETROGRADE PYELOGRAM;  Surgeon: [name], MD;  Location: [facility] ORS;  Service: Urology;  Laterality: Bilateral;  . CYSTOSCOPY W/ RETROGRADES Bilateral 03/20/2018   Procedure: CYSTOSCOPY WITH RETROGRADE PYELOGRAM;  Surgeon: [name], MD;  Location: [facility] ORS;  Service: Urology;  Laterality: Bilateral;  . CYSTOSCOPY W/ RETROGRADES  Bilateral 11/14/2018   Procedure: CYSTOSCOPY WITH RETROGRADE PYELOGRAM;  Surgeon: [name], MD;  Location: [facility] ORS;  Service: Urology;  Laterality: Bilateral;  . CYSTOSCOPY W/ URETERAL STENT PLACEMENT Bilateral 11/21/2017   Procedure: CYSTOSCOPY WITH STENT REPLACEMENT;  Surgeon: [name], MD;  Location: [facility] ORS;  Service: Urology;  Laterality: Bilateral;  . CYSTOSCOPY W/ URETERAL STENT PLACEMENT Bilateral 11/14/2018   Procedure: CYSTOSCOPY WITH STENT REPLACEMENT;  Surgeon: [name], MD;  Location: [facility] ORS;  Service: Urology;  Laterality: Bilateral;  . CYSTOSCOPY W/ URETERAL STENT REMOVAL Bilateral 11/21/2017   Procedure: CYSTOSCOPY WITH STENT REMOVAL;  Surgeon: [name], MD;  Location: [facility] ORS;  Service: Urology;  Laterality: Bilateral;  . CYSTOSCOPY WITH STENT PLACEMENT Right 05/24/2017   Procedure: CYSTOSCOPY WITH STENT PLACEMENT;  Surgeon: [name], MD;  Location: [facility] ORS;  Service: Urology;  Laterality: Right;  . CYSTOSCOPY WITH STENT PLACEMENT Bilateral 03/20/2018   Procedure: CYSTOSCOPY WITH STENT EXCHANGE;  Surgeon: [name], MD;  Location: [facility] ORS;  Service: Urology;  Laterality: Bilateral;  . IR NEPHROSTOGRAM LEFT THRU EXISTING ACCESS  07/21/2017  . IR NEPHROSTOGRAM LEFT THRU EXISTING ACCESS  08/18/2017  . IR NEPHROSTOGRAM RIGHT THRU EXISTING ACCESS  08/18/2017  . IR NEPHROSTOMY PLACEMENT LEFT  06/15/2017  . IR NEPHROSTOMY PLACEMENT RIGHT  06/14/2017  . IR URETERAL STENT PLACEMENT EXISTING ACCESS LEFT  07/21/2017  . PICC LINE INSERTION N/A 08/02/2017   Procedure: PICC LINE INSERTION;  Surgeon: Katha Cabal, MD;  Location: [facility]  INVASIVE CV LAB;  Service: Cardiovascular;  Laterality: N/A;  . PROSTATE BIOPSY N/A 05/24/2017   Procedure: PROSTATE BIOPSY;  Surgeon: [name], MD;  Location: [facility] ORS;  Service: Urology;  Laterality: N/A;  . TEE WITHOUT CARDIOVERSION N/A 07/31/2017   Procedure: TRANSESOPHAGEAL ECHOCARDIOGRAM (TEE);   Surgeon: Teodoro Spray, MD;  Location: [facility] ORS;  Service: Cardiovascular;  Laterality: N/A;  . US ECHOCARDIOGRAPHY  12/2011   Mild LV dysfunction, EF=45%    Family History  Problem Relation Age of Onset  . Cancer Mother   . Hypertension Mother   . Diabetes Father   . Heart disease Father   . Cancer Paternal Grandfather   . Prostate cancer Neg Hx   . Bladder Cancer Neg Hx   . Kidney cancer Neg Hx     Social History:  reports that he has been smoking cigars. He has a 24.00 pack-year smoking history. He has never used smokeless tobacco. He reports that he does not drink alcohol or use drugs. He has smoked 1 pack per day since age 44 (53 years). He smokes small cigars. He does not drink alcohol, but previously drank heavily. He describes being exposed to Round Up and asbestos. He has been disabled for several years secondary to "nerve damage and problems in his legs, arms, and bad back". He fell off a ladder. He previously worked for a [employer], [employer]. He lives in [place]. He has step children.The patient is alone today.   Allergies:  Allergies  Allergen Reactions  . Zytiga [Abiraterone] Diarrhea    Current Medications: Current Outpatient Medications  Medication Sig Dispense Refill  . amLODipine (NORVASC) 10 MG tablet Take 1 tablet (10 mg total) by mouth daily. (Patient taking differently: Take 10 mg by mouth every morning. ) 90 tablet 3  . buPROPion (WELLBUTRIN SR) 150 MG 12 hr tablet Take 1 tablet (150 mg total) by mouth 2 (two) times daily. 180 tablet 4  . HYDROcodone-acetaminophen (NORCO) 10-325 MG tablet Take 1 tablet by mouth every 3 (three) hours as needed. For cancer related pain Dx: C61 120 tablet 0  . Oxycodone HCl 10 MG TABS One tablet every six hours scheduled, and one every three hours as needed for breakthrough pain 120 tablet 0  . XTANDI 40 MG capsule TAKE 4 CAPSULES (160 MG TOTAL) BY MOUTH DAILY. (Patient taking differently: Take 640 mg by mouth every  morning. ) 120 capsule 0  . Ca Carbonate-Mag Hydroxide (ROLAIDS PO) Take 1 tablet by mouth as needed.    . diphenoxylate-atropine (LOMOTIL) 2.5-0.025 MG tablet Take 1 tablet by mouth 4 (four) times daily as needed for diarrhea or loose stools. (Patient not taking: Reported on 12/17/2018) 120 tablet 3  . ondansetron (ZOFRAN) 8 MG tablet Take 8 mg by mouth every 8 (eight) hours as needed. for nausea     No current facility-administered medications for this visit.     Review of Systems  Constitutional: Positive for malaise/fatigue (chronic) and weight loss (11 pounds since 08/2018). Negative for chills and fever.       Doing "okay."  HENT: Negative for congestion, hearing loss, nosebleeds, sinus pain, sore throat and tinnitus.   Eyes: Negative.  Negative for blurred vision, double vision, photophobia and pain.  Respiratory: Negative for cough, hemoptysis and shortness of breath.   Cardiovascular: Negative.  Negative for chest pain, palpitations, orthopnea, leg swelling and PND.  Gastrointestinal: Negative.  Negative for abdominal pain, constipation, diarrhea, heartburn, nausea and vomiting.  Genitourinary: Negative for dysuria,  frequency and urgency.       Nephrostomy tube exchange in 10/2018.  Musculoskeletal: Positive for joint pain (bone and leg pain). Negative for back pain and myalgias.  Skin: Negative.  Negative for itching and rash.  Neurological: Positive for weakness (generalized). Negative for dizziness, tremors, sensory change and headaches.  Endo/Heme/Allergies: Bruises/bleeds easily (easy bruising on arms).  Psychiatric/Behavioral: Negative.  Negative for depression and memory loss. The patient is not nervous/anxious and does not have insomnia.   All other systems reviewed and are negative.  Performance status (ECOG): 2  Vitals Blood pressure (!) 165/74, pulse 88, resp. rate 18, height 5\' 11"  (1.803 m), weight 151 lb 7.3 oz (68.7 kg), SpO2 100 %.   Physical Exam   Constitutional: He is oriented to person, place, and time. He appears well-developed and well-nourished. No distress.  Chronically fatigued appearing gentleman in no acute distress.  HENT:  Head: Normocephalic and atraumatic.  Mouth/Throat: Oropharynx is clear and moist. No oropharyngeal exudate.  Near alopecia.  Graying light brown beard. Mask.  Eyes: Pupils are equal, round, and reactive to light. Conjunctivae and EOM are normal. No scleral icterus.  Neck: Normal range of motion. Neck supple. No JVD present.  Cardiovascular: Normal rate, regular rhythm and normal heart sounds.  No murmur heard. Pulmonary/Chest: Effort normal and breath sounds normal. No respiratory distress. He has no wheezes.  Abdominal: Soft. Bowel sounds are normal. He exhibits no distension. There is no abdominal tenderness.  Musculoskeletal: Normal range of motion.        General: No edema.  Lymphadenopathy:    He has no cervical adenopathy.    He has no axillary adenopathy.       Right: No supraclavicular adenopathy present.       Left: No supraclavicular adenopathy present.  Neurological: He is alert and oriented to person, place, and time. He has normal reflexes.  Skin: Skin is warm and dry. Bruising (scattered, BUE, left leg) noted. He is not diaphoretic. No pallor.  Psychiatric: He has a normal mood and affect. His behavior is normal. Judgment and thought content normal.  Nursing note and vitals reviewed.   Appointment on 12/17/2018  Component Date Value Ref Range Status  . Sodium 12/17/2018 136  135 - 145 mmol/L Final  . Potassium 12/17/2018 4.5  3.5 - 5.1 mmol/L Final  . Chloride 12/17/2018 104  98 - 111 mmol/L Final  . CO2 12/17/2018 21* 22 - 32 mmol/L Final  . Glucose, Bld 12/17/2018 100* 70 - 99 mg/dL Final  . BUN 12/17/2018 34* 8 - 23 mg/dL Final  . Creatinine, Ser 12/17/2018 2.42* 0.61 - 1.24 mg/dL Final  . Calcium 12/17/2018 9.3  8.9 - 10.3 mg/dL Final  . Total Protein 12/17/2018 6.9  6.5 - 8.1  g/dL Final  . Albumin 12/17/2018 3.3* 3.5 - 5.0 g/dL Final  . AST 12/17/2018 15  15 - 41 U/L Final  . ALT 12/17/2018 9  0 - 44 U/L Final  . Alkaline Phosphatase 12/17/2018 339* 38 - 126 U/L Final  . Total Bilirubin 12/17/2018 0.4  0.3 - 1.2 mg/dL Final  . GFR calc non Af Amer 12/17/2018 27* >60 mL/min Final  . GFR calc Af Amer 12/17/2018 32* >60 mL/min Final  . Anion gap 12/17/2018 11  5 - 15 Final   Performed at [facility], [address], [place], [place] [address]  . WBC 12/17/2018 7.4  4.0 - 10.5 K/uL Final  . RBC 12/17/2018 3.65* 4.22 - 5.81  MIL/uL Final  . Hemoglobin 12/17/2018 11.8* 13.0 - 17.0 g/dL Final  . HCT 12/17/2018 34.6* 39.0 - 52.0 % Final  . MCV 12/17/2018 94.8  80.0 - 100.0 fL Final  . MCH 12/17/2018 32.3  26.0 - 34.0 pg Final  . MCHC 12/17/2018 34.1  30.0 - 36.0 g/dL Final  . RDW 12/17/2018 14.1  11.5 - 15.5 % Final  . Platelets 12/17/2018 287  150 - 400 K/uL Final  . nRBC 12/17/2018 0.0  0.0 - 0.2 % Final  . Neutrophils Relative % 12/17/2018 58  % Final  . Neutro Abs 12/17/2018 4.3  1.7 - 7.7 K/uL Final  . Lymphocytes Relative 12/17/2018 26  % Final  . Lymphs Abs 12/17/2018 1.9  0.7 - 4.0 K/uL Final  . Monocytes Relative 12/17/2018 8  % Final  . Monocytes Absolute 12/17/2018 0.6  0.1 - 1.0 K/uL Final  . Eosinophils Relative 12/17/2018 7  % Final  . Eosinophils Absolute 12/17/2018 0.5  0.0 - 0.5 K/uL Final  . Basophils Relative 12/17/2018 1  % Final  . Basophils Absolute 12/17/2018 0.1  0.0 - 0.1 K/uL Final  . Immature Granulocytes 12/17/2018 0  % Final  . Abs Immature Granulocytes 12/17/2018 0.03  0.00 - 0.07 K/uL Final   Performed at [facility], [address] [place], [place] [address]    Assessment:  Michael Small is a 63 y.o. male with metastaticprostate cancers/p transrectal prostate biopsies and cystoscopy on 05/24/2017. Prostate biopsies revealed acinar adenocarcinoma involving all 12 cores. Adenocarcinoma involved  colonic mucosa and skeletal muscle. Gleason score was 9 (4+5), grade group 5. Pathologic stagewas T4N1M1c. PSAwas 2338.8 on 04/24/2017.  Abdomen and pelvic CT on 04/27/2017 revealed prominent pelvic adenopathy with pathologic retroperitoneal adenopathy, abnormal retroperitoneal stranding, and abnormal inflammatory type stranding along the pelvic sidewalls and perirectal space. A representative right external iliac lymph node measured 3.2 cm. A left external iliac lymph node measured 2.1 cm. There was conglomerate left periaortic adenopathy 2.1 cm and a 1.4 cm aortocaval node. There was abnormal adenopathy in the perirectal spacewith prominent wall thickening in the rectumand moderate wall thickening in the sigmoid colon. The findings along the pelvic sidewalls appeared to be causingdistal ureteral obstructionresulting in moderatebilateral hydronephrosisand hydroureter. Lymphoma or prostate cancer was favored. A right external iliac node measured 3.2 cm. There was bladder wall thickeningsuggesting cystitis. There was prostatomegaly.  Bone scanon 06/22/2017 revealed findings consistent with diffuse metastatic disease. There was activity in right temporal area, sternum, anterior and posterior ribs bilaterally, and entire spine.  He received Lupron 22.5 mg every 3 months (06/01/2017; 10/02/2017; last04/27/2020) and Degarelix240 mg on 06/15/2017. He received Casodexfrom 06/13/2017 - 06/15/2017.   He began abirateroneon 02/28/2018 and discontinued after 2 days secondary to nausea, vomiting, diarrhea, and fatigue. He restarted abiraterone(1 pill/day) on 03/16/2018 and increased to 2 pills/day on 03/23/2018. He increased to 4 tablets in mid 03/2018. He began enzalutamideon 10/08/2018.  He has been on full dose enzalutamide since 10/17/2018. Testosteronewas 98 on 06/14/2017, <3 on 09/07/2017, and <3 on 03/16/2018.  PSAhas been followed: 2338.8 on 04/24/2017, 3603.0 on  06/23/2017, 1035 on 07/26/2017, 241 on 09/07/2017, 342 on 01/23/2018, 107.14 on 04/24/2018, 116.03 on 05/22/2018, 116.52 on 06/27/2018, 101.37 on 07/26/2018, 180.0 on 08/28/2018, 113.85 on 10/26/2018, and 219 on 11/20/2018.  Abdomen and pelvic CTon 09/11/2018 revealed no acute findings.Bone scanon 09/11/2018 revealed worsening bony metastatic diseasein theright humerus, left humerus, right side of the calvarium, the left side of the manubrium skin, and in  the bilateral femurs.There was persistent metastatic disease in the pelvic bones, sternum, andspine.There wassubtle persistent metastases in the posterior ribs bilaterally. Themetastasis in a medial left posterior rib wasmore prominent.The lack of soft tissue uptakewasconsistent with asuper scandue to the number metastases.  He was admitted to [facility] 06/12/2017 - 06/15/2017 with hyperkalemia and acute renal failure. Creatinine was 7.23. He underwent emergent dialysis x1. He is s/p bilateral external nephrostomy tubeplacement on 06/14/2017(last stent exchange 11/14/2018).  He was admitted to [place] 07/28/2017 - 07/31/2017 with sepsis secondary to UTI with MSSA bacteremia.   Bilateral lower extremity duplexon 06/12/2017 revealed no evidence of DVT. Code statusis DNR/DNI.  Symptomatically, he is fatigued.  Appetite is modest.  He has lost weight.  Exam is notable for weight loss.  Plan: 1.   Labs today: CBC with diff, CMP, PSA.  2.Metastatic prostate cancer Clinically, patient continues to have bone pain.             He has extensive bony metastasis. He has been on full dose enzalutamide since 10/17/2018. PSA has correlated with disease status. PSA was 180 on03/31/2020. PSAwas113.85 on 10/26/2018.                         PSA was 219 on 11/20/2018. Re-address consideration of chemotherapy (cabazitaxel).                          Potential side effects we reviewed.     Information provided on cabazitaxel. Lupron today.             Refill Xtandi per patient's request.  Send next generation sequencing for potential other options. 3.Renal insufficiency Creatinine 2.42. Patient is s/p bilateral stent exchange on 11/14/2018. 4.Cancer-related pain He continues alternating oxycodone and hydrocodone/acetaminophen. Pain medications are provided by Dr. Caryn Section. Encourage follow-up with palliative care, [name], NP. 5.   RTC in 1 month for MD assessment, labs (CBC with diff, CMP, PSA), and further discussion regarding direction of therapy.  I discussed the assessment and treatment plan with the patient.  The patient was provided an opportunity to ask questions and all were answered.  The patient agreed with the plan and demonstrated an understanding of the instructions.  The patient was advised to call back if the symptoms worsen or if the condition fails to improve as anticipated.   Lequita Asal, MD, PhD    12/17/2018, 3:05 PM  I, Molly Dorshimer, am acting as Education administrator for [employer]. Mike Gip, MD, PhD.  I, Melissa C. Mike Gip, MD, have reviewed the above documentation for accuracy and completeness, and I agree with the above.

## 2018-12-17 ENCOUNTER — Inpatient Hospital Stay: Payer: Medicare Other

## 2018-12-17 ENCOUNTER — Other Ambulatory Visit: Payer: Self-pay | Admitting: Hematology and Oncology

## 2018-12-17 ENCOUNTER — Inpatient Hospital Stay: Payer: Medicare Other | Attending: Hematology and Oncology | Admitting: Hematology and Oncology

## 2018-12-17 ENCOUNTER — Other Ambulatory Visit: Payer: Self-pay

## 2018-12-17 ENCOUNTER — Encounter: Payer: Self-pay | Admitting: Hematology and Oncology

## 2018-12-17 VITALS — BP 165/74 | HR 88 | Resp 18 | Ht 71.0 in | Wt 151.5 lb

## 2018-12-17 DIAGNOSIS — C61 Malignant neoplasm of prostate: Secondary | ICD-10-CM

## 2018-12-17 DIAGNOSIS — Z5111 Encounter for antineoplastic chemotherapy: Secondary | ICD-10-CM | POA: Diagnosis not present

## 2018-12-17 DIAGNOSIS — Z79899 Other long term (current) drug therapy: Secondary | ICD-10-CM | POA: Diagnosis not present

## 2018-12-17 DIAGNOSIS — F17210 Nicotine dependence, cigarettes, uncomplicated: Secondary | ICD-10-CM

## 2018-12-17 DIAGNOSIS — Z955 Presence of coronary angioplasty implant and graft: Secondary | ICD-10-CM | POA: Diagnosis not present

## 2018-12-17 DIAGNOSIS — G893 Neoplasm related pain (acute) (chronic): Secondary | ICD-10-CM | POA: Diagnosis not present

## 2018-12-17 DIAGNOSIS — Z809 Family history of malignant neoplasm, unspecified: Secondary | ICD-10-CM | POA: Diagnosis not present

## 2018-12-17 DIAGNOSIS — F17290 Nicotine dependence, other tobacco product, uncomplicated: Secondary | ICD-10-CM | POA: Diagnosis not present

## 2018-12-17 DIAGNOSIS — I252 Old myocardial infarction: Secondary | ICD-10-CM

## 2018-12-17 DIAGNOSIS — R5383 Other fatigue: Secondary | ICD-10-CM

## 2018-12-17 DIAGNOSIS — C7951 Secondary malignant neoplasm of bone: Secondary | ICD-10-CM | POA: Diagnosis not present

## 2018-12-17 DIAGNOSIS — Z833 Family history of diabetes mellitus: Secondary | ICD-10-CM

## 2018-12-17 DIAGNOSIS — N179 Acute kidney failure, unspecified: Secondary | ICD-10-CM

## 2018-12-17 DIAGNOSIS — I10 Essential (primary) hypertension: Secondary | ICD-10-CM | POA: Diagnosis not present

## 2018-12-17 DIAGNOSIS — Z7189 Other specified counseling: Secondary | ICD-10-CM

## 2018-12-17 DIAGNOSIS — R531 Weakness: Secondary | ICD-10-CM

## 2018-12-17 DIAGNOSIS — Z8249 Family history of ischemic heart disease and other diseases of the circulatory system: Secondary | ICD-10-CM

## 2018-12-17 LAB — CBC WITH DIFFERENTIAL/PLATELET
Abs Immature Granulocytes: 0.03 10*3/uL (ref 0.00–0.07)
Basophils Absolute: 0.1 10*3/uL (ref 0.0–0.1)
Basophils Relative: 1 %
Eosinophils Absolute: 0.5 10*3/uL (ref 0.0–0.5)
Eosinophils Relative: 7 %
HCT: 34.6 % — ABNORMAL LOW (ref 39.0–52.0)
Hemoglobin: 11.8 g/dL — ABNORMAL LOW (ref 13.0–17.0)
Immature Granulocytes: 0 %
Lymphocytes Relative: 26 %
Lymphs Abs: 1.9 10*3/uL (ref 0.7–4.0)
MCH: 32.3 pg (ref 26.0–34.0)
MCHC: 34.1 g/dL (ref 30.0–36.0)
MCV: 94.8 fL (ref 80.0–100.0)
Monocytes Absolute: 0.6 10*3/uL (ref 0.1–1.0)
Monocytes Relative: 8 %
Neutro Abs: 4.3 10*3/uL (ref 1.7–7.7)
Neutrophils Relative %: 58 %
Platelets: 287 10*3/uL (ref 150–400)
RBC: 3.65 MIL/uL — ABNORMAL LOW (ref 4.22–5.81)
RDW: 14.1 % (ref 11.5–15.5)
WBC: 7.4 10*3/uL (ref 4.0–10.5)
nRBC: 0 % (ref 0.0–0.2)

## 2018-12-17 LAB — PSA: Prostatic Specific Antigen: 412 ng/mL — ABNORMAL HIGH (ref 0.00–4.00)

## 2018-12-17 LAB — COMPREHENSIVE METABOLIC PANEL
ALT: 9 U/L (ref 0–44)
AST: 15 U/L (ref 15–41)
Albumin: 3.3 g/dL — ABNORMAL LOW (ref 3.5–5.0)
Alkaline Phosphatase: 339 U/L — ABNORMAL HIGH (ref 38–126)
Anion gap: 11 (ref 5–15)
BUN: 34 mg/dL — ABNORMAL HIGH (ref 8–23)
CO2: 21 mmol/L — ABNORMAL LOW (ref 22–32)
Calcium: 9.3 mg/dL (ref 8.9–10.3)
Chloride: 104 mmol/L (ref 98–111)
Creatinine, Ser: 2.42 mg/dL — ABNORMAL HIGH (ref 0.61–1.24)
GFR calc Af Amer: 32 mL/min — ABNORMAL LOW (ref >60)
GFR calc non Af Amer: 27 mL/min — ABNORMAL LOW (ref >60)
Glucose, Bld: 100 mg/dL — ABNORMAL HIGH (ref 70–99)
Potassium: 4.5 mmol/L (ref 3.5–5.1)
Sodium: 136 mmol/L (ref 135–145)
Total Bilirubin: 0.4 mg/dL (ref 0.3–1.2)
Total Protein: 6.9 g/dL (ref 6.5–8.1)

## 2018-12-17 MED ORDER — LEUPROLIDE ACETATE (3 MONTH) 22.5 MG IM KIT
22.5000 mg | PACK | Freq: Once | INTRAMUSCULAR | Status: AC
  Administered 2018-12-17: 22.5 mg via INTRAMUSCULAR

## 2018-12-17 MED ORDER — LEUPROLIDE ACETATE (3 MONTH) 22.5 MG IM KIT
PACK | INTRAMUSCULAR | Status: AC
  Filled 2018-12-17: qty 22.5

## 2018-12-17 MED FILL — XTANDI 40 MG CAPSULE: 40 | 30 days supply | Qty: 120 | Fill #0

## 2018-12-17 NOTE — Telephone Encounter (Signed)
CBC with Differential/Platelet Order: [id number] Status:  Final result Visible to patient:  Yes (MyChart) Next appt:  Today at 02:00 PM in Oncology ([facility]) Dx:  Prostate cancer ([place])  Ref Range & Units 3wk ago 49mo ago 18mo ago  WBC 4.0 - 10.5 K/uL 5.5  5.4  7.2   RBC 4.22 - 5.81 MIL/uL 3.74Low   4.05Low   3.88Low    Hemoglobin 13.0 - 17.0 g/dL 11.8Low   12.8Low   12.3Low    HCT 39.0 - 52.0 % 35.2Low   38.2Low   36.9Low    MCV 80.0 - 100.0 fL 94.1  94.3  95.1   MCH 26.0 - 34.0 pg 31.6  31.6  31.7   MCHC 30.0 - 36.0 g/dL 33.5  33.5  33.3   RDW 11.5 - 15.5 % 13.5  13.7  13.6   Platelets 150 - 400 K/uL 296  250  222   nRBC 0.0 - 0.2 % 0.0  0.0  0.0   Neutrophils Relative % % 50  51  70   Neutro Abs 1.7 - 7.7 K/uL 2.8  2.8  5.2   Lymphocytes Relative % 34  36  20   Lymphs Abs 0.7 - 4.0 K/uL 1.9  2.0  1.4   Monocytes Relative % 11  8  7    Monocytes Absolute 0.1 - 1.0 K/uL 0.6  0.4  0.5   Eosinophils Relative % 4  4  2    Eosinophils Absolute 0.0 - 0.5 K/uL 0.2  0.2  0.1   Basophils Relative % 1  1  1    Basophils Absolute 0.0 - 0.1 K/uL 0.1  0.1  0.0   Immature Granulocytes % 0  0  0   Abs Immature Granulocytes 0.00 - 0.07 K/uL 0.01  0.02 CM  0.01 CM   Comment: Performed at [facility], [address] [place], [place] [address]  Resulting Agency  [facility] CLIN LAB [facility] CLIN LAB [facility] LAB      Specimen Collected: 11/20/18 10:56 Last Resulted: 11/20/18 11:12     Lab Flowsheet   Order Details   View Encounter   Lab and Collection Details   Routing   Result History     CM=Additional comments      Other Results from 11/20/2018  Contains abnormal data PSA Order: [id number]  Status:  Final result Visible to patient:  Yes (MyChart) Next appt:  Today at 02:00 PM in Oncology ([facility]) Dx:  Prostate cancer ([place])  Ref Range & Units 3wk ago 5mo ago 15mo ago  Prostatic Specific Antigen 0.00 - 4.00 ng/mL 219.00High   113.85High  CM  180.00High  CM   Comment: (NOTE)   While PSA levels of <=4.0 ng/ml are reported as reference range, some  men with levels below 4.0 ng/ml can have prostate cancer and many men  with PSA above 4.0 ng/ml do not have prostate cancer. Other tests  such as free PSA, age specific reference ranges, PSA velocity and PSA  doubling time may be helpful especially in men less than 63 years  old.  Performed at [facility], [place] [address] [place],   [address]   Resulting Agency  [facility] LAB [facility] CL[facility] LAB [facility] LAB      Specimen Collected: 11/20/18 10:56 Last Resulted: 11/20/18 14:12     Lab Flowsheet   Order Details   View Encounter   Lab and Collection Details   Routing   Result History  CM=Additional comments        Contains abnormal data Comprehensive metabolic panel Order: [id number]  Status:  Final result Visible to patient:  Yes (MyChart) Next appt:  Today at 02:00 PM in Oncology ([facility]) Dx:  Prostate cancer ([place])  Ref Range & Units 3wk ago 103mo ago 60mo ago  Sodium 135 - 145 mmol/L 139  138  137   Potassium 3.5 - 5.1 mmol/L 4.9  5.0  5.0   Chloride 98 - 111 mmol/L 105  106  106   CO2 22 - 32 mmol/L 21Low   23  22   Glucose, Bld 70 - 99 mg/dL 106High   101High   110High    BUN 8 - 23 mg/dL 35High   33High   33High    Creatinine, Ser 0.61 - 1.24 mg/dL 2.50High   2.51High   2.30High    Calcium 8.9 - 10.3 mg/dL 9.2  9.2  9.0   Total Protein 6.5 - 8.1 g/dL 7.4  7.4  7.2   Albumin 3.5 - 5.0 g/dL 3.5  4.0  3.7   AST 15 - 41 U/L 13Low   16  14Low    ALT 0 - 44 U/L 7  12  10    Alkaline Phosphatase 38 - 126 U/L 328High   379High   333High    Total Bilirubin 0.3 - 1.2 mg/dL 0.3  0.4  0.4   GFR calc non Af Amer >60 mL/min 26Low   26Low   29Low    GFR calc Af Amer >60 mL/min 31Low   30Low   34Low    Anion gap 5 - 15 13  9  CM  9 CM   Comment: Performed at [facility], [address] [place], [place] [address]  Resulting Agency  [facility] CLIN LAB [facility] CLIN LAB [facility] CL[facility] LAB      Specimen  Collected: 11/20/18 10:56 Last Resulted: 11/20/18 11:33

## 2018-12-17 NOTE — Patient Instructions (Signed)
Cabazitaxel injection What is this medicine? CABAZITAXEL (ka baz i TAX el) is a chemotherapy drug. It is used to treat prostate cancer. It targets fast dividing cells, like cancer cells, and causes these cells to die. This medicine may be used for other purposes; ask your health care provider or pharmacist if you have questions. COMMON BRAND NAME(S): Jevtana What should I tell my health care provider before I take this medicine? They need to know if you have any of these conditions:  history of stomach bleeding  kidney disease  liver disease  low blood counts, like low white cell, platelet, or red cell counts  lung or breathing disease, like asthma  recent or ongoing radiation therapy  take medicines that treat or prevent blood clots  an unusual or allergic reaction to cabazitaxel, polysorbate 80, other medicines, foods, dyes, or preservatives  pregnant or trying to get pregnant  breast-feeding How should I use this medicine? This medicine is for infusion into a vein. It is given by a health care professional in a hospital or clinic setting. Talk to your pediatrician regarding the use of this medicine in children. Special care may be needed. Overdosage: If you think you have taken too much of this medicine contact a poison control center or emergency room at once. NOTE: This medicine is only for you. Do not share this medicine with others. What if I miss a dose? It is important not to miss your dose. Call your doctor or health care professional if you are unable to keep an appointment. What may interact with this medicine?  antiviral medicines for HIV or AIDS  clarithromycin  medicines for fungal infections like ketoconazole, fluconazole, itraconazole, and voriconazole  nefazodone  telithromycin This list may not describe all possible interactions. Give your health care provider a list of all the medicines, herbs, non-prescription drugs, or dietary supplements you use.  Also tell them if you smoke, drink alcohol, or use illegal drugs. Some items may interact with your medicine. What should I watch for while using this medicine? Your condition will be monitored carefully while you are receiving this medicine. This drug may make you feel generally unwell. This is not uncommon, as chemotherapy can affect healthy cells as well as cancer cells. Report any side effects. Continue your course of treatment even though you feel ill unless your doctor tells you to stop. Call your doctor or health care professional for advice if you get a fever, chills or sore throat, or other symptoms of a cold or flu. Do not treat yourself. This drug decreases your body's ability to fight infections. Try to avoid being around people who are sick. This medicine may increase your risk to bruise or bleed. Call your doctor or health care professional if you notice any unusual bleeding. Be careful brushing and flossing your teeth or using a toothpick because you may get an infection or bleed more easily. If you have any dental work done, tell your dentist you are receiving this medicine. Avoid taking products that contain aspirin, acetaminophen, ibuprofen, naproxen, or ketoprofen unless instructed by your doctor. These medicines may hide a fever. Do not become pregnant while taking this medicine. Women should inform their doctor if they wish to become pregnant or think they might be pregnant. Men should not father a child while taking this medicine and for 3 months after stopping it. There is a potential for serious side effects to an unborn child. Talk to your health care professional or pharmacist for more  information. Do not breast-feed an infant while taking this medicine. What side effects may I notice from receiving this medicine? Side effects that you should report to your doctor or health care professional as soon as possible:  allergic reactions like skin rash, itching or hives, swelling of  the face, lips, or tongue  blood in the urine  breathing problems  constipation  dark urine  diarrhea  pain in the lower back or side  pain, tingling, numbness in the hands or feet  pain when urinating  severe abdominal pain  signs of infection - fever or chills, cough, sore throat, pain or difficulty passing urine  signs and symptoms of kidney injury like trouble passing urine or change in the amount of urine  signs of decreased platelets or bleeding - bruising, pinpoint red spots on the skin, black, tarry stools, blood in the urine  signs of decreased red blood cells - unusually weak or tired, fainting spells, lightheadedness  vomiting Side effects that usually do not require medical attention (report to your doctor or health care professional if they continue or are bothersome):  back pain  change in taste  hair loss  headache  loss of appetite  muscle or joint pain  nausea  upset stomach This list may not describe all possible side effects. Call your doctor for medical advice about side effects. You may report side effects to FDA at 1-800-FDA-1088. Where should I keep my medicine? This drug is given in a hospital or clinic and will not be stored at home. NOTE: This sheet is a summary. It may not cover all possible information. If you have questions about this medicine, talk to your doctor, pharmacist, or health care provider.  2020 [employer] (2016-06-10 15:38:47)

## 2018-12-17 NOTE — Progress Notes (Signed)
No new changes noted today 

## 2018-12-18 ENCOUNTER — Telehealth: Payer: Self-pay

## 2018-12-18 NOTE — Telephone Encounter (Signed)
-----   Message from Lequita Asal, MD sent at 12/18/2018  3:38 PM EDT ----- Regarding: Please call patient with PSA results  ----- Message ----- From: Interface, [facility] [place] Sent: 12/17/2018   2:00 PM EDT To: Lequita Asal, MD

## 2018-12-18 NOTE — Telephone Encounter (Signed)
Left a message on the patient voice mail to inform him of his PSA results. It has increase from [phone]. I have informed the patient if he have any question please feel free to contact the office.

## 2018-12-21 DIAGNOSIS — C61 Malignant neoplasm of prostate: Secondary | ICD-10-CM | POA: Diagnosis not present

## 2018-12-24 ENCOUNTER — Other Ambulatory Visit: Payer: Self-pay | Admitting: Family Medicine

## 2018-12-24 DIAGNOSIS — R609 Edema, unspecified: Secondary | ICD-10-CM

## 2018-12-27 ENCOUNTER — Other Ambulatory Visit: Payer: Self-pay | Admitting: Family Medicine

## 2018-12-27 DIAGNOSIS — G893 Neoplasm related pain (acute) (chronic): Secondary | ICD-10-CM

## 2018-12-27 MED ORDER — OXYCODONE HCL 10 MG PO TABS: ORAL_TABLET | ORAL | 0 refills | Status: DC

## 2018-12-27 MED ORDER — HYDROCODONE-ACETAMINOPHEN 10-325 MG PO TABS: 1.0000 | ORAL_TABLET | ORAL | 0 refills | Status: DC | PRN

## 2018-12-27 NOTE — Telephone Encounter (Signed)
Pt needing refills on:  Oxycodone HCl 10 MG TABS HYDROcodone-acetaminophen (NORCO) 10-325 MG tablet  Please fill at:  [id number] [address], [place] [phone] (Phone) [phone] (Fax)   Thanks, [employer]

## 2019-01-04 ENCOUNTER — Encounter: Payer: Self-pay | Admitting: Hematology and Oncology

## 2019-01-07 ENCOUNTER — Encounter: Payer: Self-pay | Admitting: Hematology and Oncology

## 2019-01-10 NOTE — Progress Notes (Signed)
[facility]  [address] [place],  [address] Phone: [phone]  Fax: [phone]   Clinic Day:  01/10/2019  Referring physician: Birdie Sons, MD  Chief Complaint: Michael Small is a 63 y.o. male with metastatic prostate cancer who is seen for 1 month assessment on Xtandi.  HPI: The patient was last seen in the medical oncology clinic on 12/17/2018. At that time, he was fatigued. Appetite was modest.  He had lost weight.  He received Lupron. Hematocrit was 34.6. Hemoglobin 11.8. BUN 34. Creatinine was 2.42. Albumin was 3.3, alkaline phosphate 339.  PSA was 412.  We discussed progression of disease and consideration of chemotherapy.  He was interested in other options.  OmniSeq testing on 12/26/2018 revealed no actionable mutations.  MSI was stable, PD-L1 0%, TMB 3.5/Mb (low) and negative ATM mutation, BRCA1/2 mutation, and NTRK fusion.  There was TMPRSS2e1-ERGe5 fusion and TP53 c.818G>T (R273L).  During the interim, the patient notes increased pain which at times makes him want to "scream".  He reports right hip pain that radiates down to his right knee. He rates his pain as 7/10.  He has recently doubled up his pain medications.  He has been taking 2 hydrocodone 10-325 mg every 3 hours and 2 oxycodone 10 mg every 6 hours.  He denies any lower extremity numbness or weakness.  He denies any falls.  He reports that he is eating.  His weight is down 8 pounds in the clinic today.  He declines an appetite stimulant.  He is not interested in seeing a nutritionist. The patient remains adamant about no chemotherapy.  He is willing to see radiation oncology for palliative radiation.  He is willing to see [name], NP of palliative care medicine.   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   Depression    GERD (gastroesophageal reflux disease)    occ    Headache    migraines occ    Hyperlipidemia    Hypertension    Myocardial infarction [facility])    s/p stent   Neuromuscular disorder (HCC)    numbness in arms and legs   Prostate cancer [facility])     Past Surgical History:  Procedure Laterality Date   CORONARY ANGIOPLASTY WITH STENT PLACEMENT  12/2011   [facility]   CYSTOSCOPY W/ RETROGRADES Bilateral 05/24/2017   Procedure: CYSTOSCOPY WITH RETROGRADE PYELOGRAM;  Surgeon: [name], MD;  Location: [facility] ORS;  Service: Urology;  Laterality: Bilateral;   CYSTOSCOPY W/ RETROGRADES Bilateral 11/21/2017   Procedure: CYSTOSCOPY WITH RETROGRADE PYELOGRAM;  Surgeon: [name], MD;  Location: [facility] ORS;  Service: Urology;  Laterality: Bilateral;   CYSTOSCOPY W/ RETROGRADES Bilateral 03/20/2018   Procedure: CYSTOSCOPY WITH RETROGRADE PYELOGRAM;  Surgeon: [name], MD;  Location: [facility] ORS;  Service: Urology;  Laterality: Bilateral;   CYSTOSCOPY W/ RETROGRADES Bilateral 11/14/2018   Procedure: CYSTOSCOPY WITH RETROGRADE PYELOGRAM;  Surgeon: [name], MD;  Location: [facility] ORS;  Service: Urology;  Laterality: Bilateral;   CYSTOSCOPY W/ URETERAL STENT PLACEMENT Bilateral 11/21/2017   Procedure: CYSTOSCOPY WITH STENT REPLACEMENT;  Surgeon: [name], MD;  Location: [facility] ORS;  Service: Urology;  Laterality: Bilateral;   CYSTOSCOPY W/ URETERAL STENT PLACEMENT Bilateral 11/14/2018   Procedure: CYSTOSCOPY WITH STENT REPLACEMENT;  Surgeon: [name], MD;  Location: [facility] ORS;  Service: Urology;  Laterality: Bilateral;   CYSTOSCOPY W/ URETERAL  STENT REMOVAL Bilateral 11/21/2017   Procedure: CYSTOSCOPY WITH STENT REMOVAL;  Surgeon: [name], MD;  Location: [facility] OR[facility];  Service: Urology;  Laterality: Bilateral;   CYSTOSCOPY WITH STENT PLACEMENT Right 05/24/2017   Procedure: CYSTOSCOPY WITH STENT PLACEMENT;  Surgeon: [name], MD;  Location: [facility] OR[facility];  Service: Urology;  Laterality: Right;   CYSTOSCOPY WITH STENT PLACEMENT  Bilateral 03/20/2018   Procedure: CYSTOSCOPY WITH STENT EXCHANGE;  Surgeon: [name], MD;  Location: [facility] OR[facility];  Service: Urology;  Laterality: Bilateral;   IR NEPHROSTOGRAM LEFT THRU EXISTING ACCESS  07/21/2017   IR NEPHROSTOGRAM LEFT THRU EXISTING ACCESS  08/18/2017   IR NEPHROSTOGRAM RIGHT THRU EXISTING ACCESS  08/18/2017   IR NEPHROSTOMY PLACEMENT LEFT  06/15/2017   IR NEPHROSTOMY PLACEMENT RIGHT  06/14/2017   IR URETERAL STENT PLACEMENT EXISTING ACCESS LEFT  07/21/2017   PICC LINE INSERTION N/A 08/02/2017   Procedure: PICC LINE INSERTION;  Surgeon: Katha Cabal, MD;  Location: [facility] [facility];  Service: Cardiovascular;  Laterality: N/A;   PROSTATE BIOPSY N/A 05/24/2017   Procedure: PROSTATE BIOPSY;  Surgeon: [name], MD;  Location: [facility];  Service: Urology;  Laterality: N/A;   TEE WITHOUT CARDIOVERSION N/A 07/31/2017   Procedure: TRANSESOPHAGEAL ECHOCARDIOGRAM (TEE);  Surgeon: Teodoro Spray, MD;  Location: [facility] OR[facility];  Service: Cardiovascular;  Laterality: N/A;   US ECHOCARDIOGRAPHY  12/2011   Mild LV dysfunction, EF=45%    Family History  Problem Relation Age of Onset   Cancer Mother    Hypertension Mother    Diabetes Father    Heart disease Father    Cancer Paternal Grandfather    Prostate cancer Neg Hx    Bladder Cancer Neg Hx    Kidney cancer Neg Hx     Social History:  reports that he has been smoking cigars. He has a 24.00 pack-year smoking history. He has never used smokeless tobacco. He reports that he does not drink alcohol or use drugs.  He has smoked 1 pack per day since age 49 (42 years). He smokes small cigars. He does not drink alcohol, but previously drank heavily. He describes being exposed to Round Up and asbestos. He has been disabled for several years secondary to "nerve damage and problems in his legs, arms, and bad back". He fell off a ladder. He previously worked for a [employer], [employer]. He lives in [place].  He has step children. The patient is alone today.  Allergies:  Allergies  Allergen Reactions   Zytiga [Abiraterone] Diarrhea    Current Medications: Current Outpatient Medications  Medication Sig Dispense Refill   amLODipine (NORVASC) 10 MG tablet Take 1 tablet (10 mg total) by mouth daily. (Patient taking differently: Take 10 mg by mouth every morning. ) 90 tablet 3   buPROPion (WELLBUTRIN SR) 150 MG 12 hr tablet Take 1 tablet (150 mg total) by mouth 2 (two) times daily. 180 tablet 4   Ca Carbonate-Mag Hydroxide (ROLAIDS PO) Take 1 tablet by mouth as needed.     diphenoxylate-atropine (LOMOTIL) 2.5-0.025 MG tablet Take 1 tablet by mouth 4 (four) times daily as needed for diarrhea or loose stools. (Patient not taking: Reported on 12/17/2018) 120 tablet 3   furosemide (LASIX) 20 MG tablet Take 1 tablet (20 mg total) by mouth daily as needed for edema. 90 tablet 3   HYDROcodone-acetaminophen (NORCO) 10-325 MG tablet Take 1 tablet by mouth every 3 (three) hours as needed. For cancer related pain Dx: C61 120 tablet  0   ondansetron (ZOFRAN) 8 MG tablet Take 8 mg by mouth every 8 (eight) hours as needed. for nausea     Oxycodone HCl 10 MG TABS One tablet every six hours scheduled, and one every three hours as needed for breakthrough pain 120 tablet 0   XTANDI 40 MG capsule TAKE 4 CAPSULES (160 MG TOTAL) BY MOUTH DAILY. 120 capsule 0   No current facility-administered medications for this visit.     Review of Systems  Constitutional: Positive for malaise/fatigue (chronic) and weight loss (8 lbs). Negative for chills and fever.  HENT: Negative.  Negative for congestion, hearing loss, nosebleeds, sinus pain, sore throat and tinnitus.   Eyes: Negative.  Negative for blurred vision, double vision, photophobia and pain.  Respiratory: Negative for cough, hemoptysis and shortness of breath.   Cardiovascular: Negative.  Negative for chest pain, palpitations, orthopnea, leg swelling and PND.    Gastrointestinal: Negative for abdominal pain, constipation, diarrhea, heartburn, nausea and vomiting.       Decrease appetite.  Genitourinary: Negative for dysuria, frequency and urgency.       Nephrostomy tube exchange in 10/2018.  Musculoskeletal: Positive for joint pain (right hip and knee). Negative for back pain and myalgias.  Skin: Negative.  Negative for itching and rash.  Neurological: Positive for weakness (generalized). Negative for dizziness, tremors, sensory change, speech change, focal weakness and headaches.  Endo/Heme/Allergies: Bruises/bleeds easily (easy bruising on arms).  Psychiatric/Behavioral: Negative.  Negative for depression and memory loss. The patient is not nervous/anxious and does not have insomnia.   All other systems reviewed and are negative.  Performance status (ECOG):  2  Vitals Blood pressure (!) 181/78, pulse 85, temperature (!) 96.9 F (36.1 C), temperature source Tympanic, resp. rate 18, height 5' 11"  (1.803 m), weight 143 lb 10.1 oz (65.2 kg), SpO2 100 %.  Physical Exam  Constitutional: He is oriented to person, place, and time. He appears well-developed and well-nourished.  Chronically fatigued appearing gentleman sitting in the exam room in no acute distress.  HENT:  Head: Normocephalic and atraumatic.  Mouth/Throat: Oropharynx is clear and moist. No oropharyngeal exudate.  Near alopecia.  Graying light brown beard.  Mask.  Eyes: Pupils are equal, round, and reactive to light. Conjunctivae and EOM are normal. No scleral icterus.  Neck: Normal range of motion. Neck supple. No JVD present.  Cardiovascular: Normal rate, regular rhythm and normal heart sounds. Exam reveals no gallop and no friction rub.  No murmur heard. Pulmonary/Chest: Effort normal and breath sounds normal. No respiratory distress. He has no wheezes. He has no rales.  Abdominal: Soft. Bowel sounds are normal. He exhibits no distension and no mass. There is no abdominal  tenderness. There is no rebound and no guarding.  Musculoskeletal: Normal range of motion.        General: No edema.     Comments: No back pain on palpation.  Tender right hip.  Lymphadenopathy:    He has no cervical adenopathy.    He has no axillary adenopathy.       Right: No supraclavicular adenopathy present.       Left: No supraclavicular adenopathy present.  Neurological: He is alert and oriented to person, place, and time. He has normal reflexes.  Skin: Skin is warm and dry. No bruising noted. He is not diaphoretic. No pallor.  Psychiatric: He has a normal mood and affect. His behavior is normal. Judgment and thought content normal.  Nursing note and vitals reviewed.   No  visits with results within 3 Day(s) from this visit.  Latest known visit with results is:  Appointment on 12/17/2018  Component Date Value Ref Range Status   Prostatic Specific Antigen 12/17/2018 412.00* 0.00 - 4.00 ng/mL Final   Comment: (NOTE) While PSA levels of <=4.0 ng/ml are reported as reference range, some men with levels below 4.0 ng/ml can have prostate cancer and many men with PSA above 4.0 ng/ml do not have prostate cancer.  Other tests such as free PSA, age specific reference ranges, PSA velocity and PSA doubling time may be helpful especially in men less than 45 years old. Performed at [facility], [place] [address], [place], [place] [address]    Sodium 12/17/2018 136  135 - 145 mmol/L Final   Potassium 12/17/2018 4.5  3.5 - 5.1 mmol/L Final   Chloride 12/17/2018 104  98 - 111 mmol/L Final   CO2 12/17/2018 21* 22 - 32 mmol/L Final   Glucose, Bld 12/17/2018 100* 70 - 99 mg/dL Final   BUN 12/17/2018 34* 8 - 23 mg/dL Final   Creatinine, Ser 12/17/2018 2.42* 0.61 - 1.24 mg/dL Final   Calcium 12/17/2018 9.3  8.9 - 10.3 mg/dL Final   Total Protein 12/17/2018 6.9  6.5 - 8.1 g/dL Final   Albumin 12/17/2018 3.3* 3.5 - 5.0 g/dL Final   AST 12/17/2018 15  15 - 41 U/L Final   ALT  12/17/2018 9  0 - 44 U/L Final   Alkaline Phosphatase 12/17/2018 339* 38 - 126 U/L Final   Total Bilirubin 12/17/2018 0.4  0.3 - 1.2 mg/dL Final   GFR calc non Af Amer 12/17/2018 27* >60 mL/min Final   GFR calc Af Amer 12/17/2018 32* >60 mL/min Final   Anion gap 12/17/2018 11  5 - 15 Final   Performed at [facility], [address], [place], [place] [address]   WBC 12/17/2018 7.4  4.0 - 10.5 K/uL Final   RBC 12/17/2018 3.65* 4.22 - 5.81 MIL/uL Final   Hemoglobin 12/17/2018 11.8* 13.0 - 17.0 g/dL Final   HCT 12/17/2018 34.6* 39.0 - 52.0 % Final   MCV 12/17/2018 94.8  80.0 - 100.0 fL Final   MCH 12/17/2018 32.3  26.0 - 34.0 pg Final   MCHC 12/17/2018 34.1  30.0 - 36.0 g/dL Final   RDW 12/17/2018 14.1  11.5 - 15.5 % Final   Platelets 12/17/2018 287  150 - 400 K/uL Final   nRBC 12/17/2018 0.0  0.0 - 0.2 % Final   Neutrophils Relative % 12/17/2018 58  % Final   Neutro Abs 12/17/2018 4.3  1.7 - 7.7 K/uL Final   Lymphocytes Relative 12/17/2018 26  % Final   Lymphs Abs 12/17/2018 1.9  0.7 - 4.0 K/uL Final   Monocytes Relative 12/17/2018 8  % Final   Monocytes Absolute 12/17/2018 0.6  0.1 - 1.0 K/uL Final   Eosinophils Relative 12/17/2018 7  % Final   Eosinophils Absolute 12/17/2018 0.5  0.0 - 0.5 K/uL Final   Basophils Relative 12/17/2018 1  % Final   Basophils Absolute 12/17/2018 0.1  0.0 - 0.1 K/uL Final   Immature Granulocytes 12/17/2018 0  % Final   Abs Immature Granulocytes 12/17/2018 0.03  0.00 - 0.07 K/uL Final   Performed at [facility], [address], [place],  [address]    Assessment:  Michael Small is a 62 y.o. male with metastaticprostate cancers/p transrectal prostate biopsies and cystoscopy on 05/24/2017. Prostate biopsies revealed acinar  adenocarcinoma involving all 12 cores. Adenocarcinoma involved colonic mucosa and skeletal muscle. Gleason score was 9 (4+5), grade group 5. Pathologic stagewas T4N1M1c.  PSAwas 2338.8 on 04/24/2017.  OmniSeq testing on 12/26/2018 revealed MSI stable, PD-L1 0%, TMB 3.5/Mb (low) and negative ATM mutation, BRCA1/2 mutation, and NTRK fusion.  There was TMPRSS2e1-ERGe5 fusion and TP53 c.818G>T (R273L).  Abdomen and pelvic CT on 04/27/2017 revealed prominent pelvic adenopathy with pathologic retroperitoneal adenopathy, abnormal retroperitoneal stranding, and abnormal inflammatory type stranding along the pelvic sidewalls and perirectal space. A representative right external iliac lymph node measured 3.2 cm. A left external iliac lymph node measured 2.1 cm. There was conglomerate left periaortic adenopathy 2.1 cm and a 1.4 cm aortocaval node. There was abnormal adenopathy in the perirectal spacewith prominent wall thickening in the rectumand moderate wall thickening in the sigmoid colon. The findings along the pelvic sidewalls appeared to be causingdistal ureteral obstructionresulting in moderatebilateral hydronephrosisand hydroureter. Lymphoma or prostate cancer was favored. A right external iliac node measured 3.2 cm. There was bladder wall thickeningsuggesting cystitis. There was prostatomegaly.  Bone scanon 06/22/2017 revealed findings consistent with diffuse metastatic disease. There was activity in right temporal area, sternum, anterior and posterior ribs bilaterally, and entire spine.  He received Lupron 22.5 mg every 3 months (06/01/2017; 10/02/2017; last07/20/2020) and Degarelix240 mg on 06/15/2017. He received Casodexfrom 06/13/2017 - 06/15/2017.   He began abirateroneon 02/28/2018 and discontinued after 2 days secondary to nausea, vomiting, diarrhea, and fatigue. He restarted abiraterone(1 pill/day) on 03/16/2018 and increased to 2 pills/day on 03/23/2018. He increased to 4 tablets in mid 03/2018. He began enzalutamideon05/03/2019.He has been on full dose enzalutamide since 10/17/2018.Testosteronewas 98 on 06/14/2017, <3 on  09/07/2017, and <3 on 03/16/2018.  PSAhas been followed: 2338.8 on 04/24/2017, 3603.0 on 06/23/2017, 1035 on 07/26/2017, 241 on 09/07/2017, 342 on 01/23/2018, 107.14 on 04/24/2018, 116.03 on 05/22/2018, 116.52 on 06/27/2018, 101.37 on 07/26/2018, 180.0 on 08/28/2018, 113.85 on 10/26/2018, 219 on 11/20/2018, 412 on 12/17/2018, and 404 on 01/22/2019.  Abdomen and pelvic CTon 09/11/2018 revealed no acute findings.Bone scanon 09/11/2018 revealed worsening bony metastatic diseasein theright humerus, left humerus, right side of the calvarium, the left side of the manubrium skin, and in the bilateral femurs.There was persistent metastatic disease in the pelvic bones, sternum, andspine.There wassubtle persistent metastases in the posterior ribs bilaterally. Themetastasis in a medial left posterior rib wasmore prominent.The lack of soft tissue uptakewasconsistent with asuper scandue to the number metastases.  He was admitted to [facility] 06/12/2017 - 06/15/2017 with hyperkalemia and acute renal failure. Creatinine was 7.23. He underwent emergent dialysis x1. He is s/p bilateral external nephrostomy tubeplacement on 06/14/2017(laststent exchange06/17/2020).  He was admitted to [facility] 07/28/2017 - 07/31/2017 with sepsis secondary to UTI with MSSA bacteremia.   Bilateral lower extremity duplexon 06/12/2017 revealed no evidence of DVT. Code statusis DNR/DNI.  Symptomatically, he has increasing pain; he has increased his pain medications.  Pain is predominantly in his right hip but extends to his knee.  Alkaline phosphatase is 408.  Plan: 1.   Labs today: CBC with diff, CMP, PSA.  2.Metastatic prostate cancer Clinically, his disease is progressing with increased pain. He has extensive bony metastasis. He has been on full dose enzalutamide since05/20/2020. PSA has correlated with disease  status. PSA was 180 on03/31/2020. PSAwas113.85 on 10/26/2018. PSA was 219 on06/23/2020.   PSA was 412 on 12/17/2018. Patient declines chemotherapy.  Next generation sequencing provides no options. Discuss consideration of palliative radiation to right hip and possibly future Xofigo.  Discuss unclear renal requirements (CrCl) for Xofigo.   Creatinine has improved since last stent placement (CrCl 45.6 ml/min). 3.Renal insufficiency Creatinine 1.51 (improved). He is s/p bilateral stent exchange on 11/14/2018. Continue to monitor. 4.Cancer-related pain Pain has increased in intensity since his last visit.    He has increased his pain medications on his own.    Discuss concern for his increased use of Tylenol with hydrocodone.   Discuss risk of Tylenol induced liver toxicity. Pain medications have been provided by Dr. Caryn Section.   Discuss consideration of referral to [name], NP in palliative care, to take over pain medications. 5.   Weight loss  Discuss importance of nutrition and caloric intake.  Discuss appetite stimulant- patient declines. 6.   Radiation Oncology consult. 7.   Palliative care consult with Billey Chang, NP- called. 8.   RTC in 2 weeks for MD assessment.  I discussed the assessment and treatment plan with the patient.  The patient was provided an opportunity to ask questions and all were answered.  The patient agreed with the plan and demonstrated an understanding of the instructions.  The patient was advised to call back if the symptoms worsen or if the condition fails to improve as anticipated.    Lequita Asal, MD, PhD    01/10/2019, 11:52 AM  I, Selena Batten, am acting as scribe for [employer]. Mike Gip, MD, PhD.  I, Virgie Chery C. Mike Gip, MD, have reviewed the above documentation for accuracy and completeness, and I  agree with the above.

## 2019-01-21 NOTE — Progress Notes (Signed)
Confirmed Name, DOB, Address. Assessment completed with help of spouse, Gregery Walberg. Reports patient's pain in left hip, right leg, and lower back continue to progress and pain medications are not helping. Also reports he continues to have head pain. Denies any other concerns.

## 2019-01-22 ENCOUNTER — Other Ambulatory Visit: Payer: Self-pay

## 2019-01-22 ENCOUNTER — Encounter: Payer: Self-pay | Admitting: Hematology and Oncology

## 2019-01-22 ENCOUNTER — Inpatient Hospital Stay: Payer: Medicare Other

## 2019-01-22 ENCOUNTER — Inpatient Hospital Stay: Payer: Medicare Other | Attending: Hematology and Oncology | Admitting: Hematology and Oncology

## 2019-01-22 VITALS — BP 181/78 | HR 85 | Temp 96.9°F | Resp 18 | Ht 71.0 in | Wt 143.6 lb

## 2019-01-22 DIAGNOSIS — F17290 Nicotine dependence, other tobacco product, uncomplicated: Secondary | ICD-10-CM | POA: Diagnosis not present

## 2019-01-22 DIAGNOSIS — G893 Neoplasm related pain (acute) (chronic): Secondary | ICD-10-CM | POA: Diagnosis not present

## 2019-01-22 DIAGNOSIS — R5383 Other fatigue: Secondary | ICD-10-CM | POA: Insufficient documentation

## 2019-01-22 DIAGNOSIS — F17210 Nicotine dependence, cigarettes, uncomplicated: Secondary | ICD-10-CM | POA: Insufficient documentation

## 2019-01-22 DIAGNOSIS — C7951 Secondary malignant neoplasm of bone: Secondary | ICD-10-CM

## 2019-01-22 DIAGNOSIS — Z8249 Family history of ischemic heart disease and other diseases of the circulatory system: Secondary | ICD-10-CM | POA: Diagnosis not present

## 2019-01-22 DIAGNOSIS — Z809 Family history of malignant neoplasm, unspecified: Secondary | ICD-10-CM | POA: Insufficient documentation

## 2019-01-22 DIAGNOSIS — Z66 Do not resuscitate: Secondary | ICD-10-CM | POA: Diagnosis not present

## 2019-01-22 DIAGNOSIS — R634 Abnormal weight loss: Secondary | ICD-10-CM | POA: Insufficient documentation

## 2019-01-22 DIAGNOSIS — C61 Malignant neoplasm of prostate: Secondary | ICD-10-CM | POA: Diagnosis not present

## 2019-01-22 DIAGNOSIS — Z7189 Other specified counseling: Secondary | ICD-10-CM | POA: Diagnosis not present

## 2019-01-22 DIAGNOSIS — N289 Disorder of kidney and ureter, unspecified: Secondary | ICD-10-CM

## 2019-01-22 DIAGNOSIS — N189 Chronic kidney disease, unspecified: Secondary | ICD-10-CM | POA: Insufficient documentation

## 2019-01-22 DIAGNOSIS — Z79899 Other long term (current) drug therapy: Secondary | ICD-10-CM | POA: Insufficient documentation

## 2019-01-22 DIAGNOSIS — I252 Old myocardial infarction: Secondary | ICD-10-CM | POA: Diagnosis not present

## 2019-01-22 DIAGNOSIS — Z833 Family history of diabetes mellitus: Secondary | ICD-10-CM | POA: Diagnosis not present

## 2019-01-22 DIAGNOSIS — M25551 Pain in right hip: Secondary | ICD-10-CM | POA: Diagnosis not present

## 2019-01-22 DIAGNOSIS — M25561 Pain in right knee: Secondary | ICD-10-CM | POA: Diagnosis not present

## 2019-01-22 DIAGNOSIS — R531 Weakness: Secondary | ICD-10-CM | POA: Diagnosis not present

## 2019-01-22 LAB — CBC WITH DIFFERENTIAL/PLATELET
Abs Immature Granulocytes: 0.04 10*3/uL (ref 0.00–0.07)
Basophils Absolute: 0.1 10*3/uL (ref 0.0–0.1)
Basophils Relative: 1 %
Eosinophils Absolute: 0.2 10*3/uL (ref 0.0–0.5)
Eosinophils Relative: 2 %
HCT: 35.3 % — ABNORMAL LOW (ref 39.0–52.0)
Hemoglobin: 12 g/dL — ABNORMAL LOW (ref 13.0–17.0)
Immature Granulocytes: 1 %
Lymphocytes Relative: 26 %
Lymphs Abs: 1.8 10*3/uL (ref 0.7–4.0)
MCH: 33.2 pg (ref 26.0–34.0)
MCHC: 34 g/dL (ref 30.0–36.0)
MCV: 97.8 fL (ref 80.0–100.0)
Monocytes Absolute: 0.6 10*3/uL (ref 0.1–1.0)
Monocytes Relative: 9 %
Neutro Abs: 4.2 10*3/uL (ref 1.7–7.7)
Neutrophils Relative %: 61 %
Platelets: 346 10*3/uL (ref 150–400)
RBC: 3.61 MIL/uL — ABNORMAL LOW (ref 4.22–5.81)
RDW: 14.8 % (ref 11.5–15.5)
WBC: 6.9 10*3/uL (ref 4.0–10.5)
nRBC: 0 % (ref 0.0–0.2)

## 2019-01-22 LAB — COMPREHENSIVE METABOLIC PANEL
ALT: 9 U/L (ref 0–44)
AST: 17 U/L (ref 15–41)
Albumin: 3.2 g/dL — ABNORMAL LOW (ref 3.5–5.0)
Alkaline Phosphatase: 408 U/L — ABNORMAL HIGH (ref 38–126)
Anion gap: 11 (ref 5–15)
BUN: 26 mg/dL — ABNORMAL HIGH (ref 8–23)
CO2: 22 mmol/L (ref 22–32)
Calcium: 9.1 mg/dL (ref 8.9–10.3)
Chloride: 105 mmol/L (ref 98–111)
Creatinine, Ser: 1.51 mg/dL — ABNORMAL HIGH (ref 0.61–1.24)
GFR calc Af Amer: 56 mL/min — ABNORMAL LOW (ref >60)
GFR calc non Af Amer: 48 mL/min — ABNORMAL LOW (ref >60)
Glucose, Bld: 116 mg/dL — ABNORMAL HIGH (ref 70–99)
Potassium: 4.6 mmol/L (ref 3.5–5.1)
Sodium: 138 mmol/L (ref 135–145)
Total Bilirubin: 0.2 mg/dL — ABNORMAL LOW (ref 0.3–1.2)
Total Protein: 7.1 g/dL (ref 6.5–8.1)

## 2019-01-22 LAB — PSA: Prostatic Specific Antigen: 404 ng/mL — ABNORMAL HIGH (ref 0.00–4.00)

## 2019-01-22 NOTE — Progress Notes (Signed)
The patient c/o increase pain from lower to knee ( pain 7) The patient states he has been taking the Norco 2 tabs every 3 hrs due to the pain increase.

## 2019-01-23 ENCOUNTER — Encounter: Payer: Self-pay | Admitting: Hospice and Palliative Medicine

## 2019-01-23 ENCOUNTER — Inpatient Hospital Stay: Payer: Medicare Other | Attending: Hospice and Palliative Medicine | Admitting: Hospice and Palliative Medicine

## 2019-01-23 ENCOUNTER — Other Ambulatory Visit: Payer: Self-pay

## 2019-01-23 VITALS — BP 154/70 | HR 102 | Temp 99.5°F | Resp 18 | Wt 144.0 lb

## 2019-01-23 DIAGNOSIS — I252 Old myocardial infarction: Secondary | ICD-10-CM | POA: Insufficient documentation

## 2019-01-23 DIAGNOSIS — F329 Major depressive disorder, single episode, unspecified: Secondary | ICD-10-CM | POA: Insufficient documentation

## 2019-01-23 DIAGNOSIS — Z515 Encounter for palliative care: Secondary | ICD-10-CM | POA: Diagnosis not present

## 2019-01-23 DIAGNOSIS — Z79899 Other long term (current) drug therapy: Secondary | ICD-10-CM | POA: Diagnosis not present

## 2019-01-23 DIAGNOSIS — E785 Hyperlipidemia, unspecified: Secondary | ICD-10-CM | POA: Diagnosis not present

## 2019-01-23 DIAGNOSIS — C7951 Secondary malignant neoplasm of bone: Secondary | ICD-10-CM

## 2019-01-23 DIAGNOSIS — G893 Neoplasm related pain (acute) (chronic): Secondary | ICD-10-CM | POA: Diagnosis not present

## 2019-01-23 DIAGNOSIS — I10 Essential (primary) hypertension: Secondary | ICD-10-CM | POA: Diagnosis not present

## 2019-01-23 DIAGNOSIS — C61 Malignant neoplasm of prostate: Secondary | ICD-10-CM | POA: Diagnosis not present

## 2019-01-23 MED ORDER — NALOXONE HCL 4 MG/0.1ML NA LIQD: NASAL | 0 refills | Status: AC

## 2019-01-23 MED ORDER — DEXAMETHASONE 4 MG PO TABS: 4.0000 mg | ORAL_TABLET | Freq: Every day | ORAL | 0 refills | Status: AC

## 2019-01-23 MED ORDER — OXYCODONE HCL ER 15 MG PO T12A: 15.0000 mg | EXTENDED_RELEASE_TABLET | Freq: Three times a day (TID) | ORAL | 0 refills | Status: DC

## 2019-01-23 MED ORDER — OXYCODONE HCL 10 MG PO TABS: 10.0000 mg | ORAL_TABLET | ORAL | 0 refills | Status: DC | PRN

## 2019-01-23 NOTE — Progress Notes (Signed)
[place]  [phone] [phone]   Name: Michael Small Date: 01/23/2019 MRN: [id number]  DOB: 11-Aug-1955  Patient Care Team: Birdie Sons, MD as PCP - General (Family Medicine) Quay Burow Wandra Feinstein, NP as Nurse Practitioner (Oncology) [name], MD (Urology) Nickie Retort, MD as Consulting Physician (Urology) Lequita Asal, MD as Referring Physician (Hematology and Oncology) Corey Skains, MD as Consulting Physician (Cardiology)    REASON FOR CONSULTATION: Palliative Care consult requested for this 63 y.o. 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  He has bilateral ureteral obstruction status post stenting.  He has extensive bony metastases with evidence of disease progression on bone scan in April 2020.  PSA has been uptrending over the past several months.  Patient has declined chemotherapy.  Additionally, patient has had worsening pain and weight loss.  He was referred to palliative care to help address goals and manage ongoing symptoms.   SOCIAL HISTORY:     reports that he has been smoking cigars. He has a 24.00 pack-year smoking history. He has never used smokeless tobacco. He reports that he does not drink alcohol or use drugs.   Patient is married and lives at home with his wife.  He has no children.  He has a Fransisco Beau terrier that he says is "like a child."  Patient previously worked in [employer].  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   Depression    GERD (gastroesophageal reflux disease)    occ    Headache    migraines occ   Hyperlipidemia    Hypertension    Myocardial infarction [facility])    s/p stent   Neuromuscular  disorder ([place])    numbness in arms and legs   Prostate cancer ([place])     PAST SURGICAL HISTORY:  Past Surgical History:  Procedure Laterality Date   CORONARY ANGIOPLASTY WITH STENT PLACEMENT  12/2011   [facility] Cardiology   CYSTOSCOPY W/ RETROGRADES Bilateral 05/24/2017   Procedure: CYSTOSCOPY WITH RETROGRADE PYELOGRAM;  Surgeon: [name], MD;  Location: [facility] ORS;  Service: Urology;  Laterality: Bilateral;   CYSTOSCOPY W/ RETROGRADES Bilateral 11/21/2017   Procedure: CYSTOSCOPY WITH RETROGRADE PYELOGRAM;  Surgeon: [name], MD;  Location: [facility] OR[facility];  Service: Urology;  Laterality: Bilateral;   CYSTOSCOPY W/ RETROGRADES Bilateral 03/20/2018   Procedure: CYSTOSCOPY WITH RETROGRADE PYELOGRAM;  Surgeon: [name], MD;  Location: [facility] OR[facility];  Service: Urology;  Laterality: Bilateral;   CYSTOSCOPY W/ RETROGRADES Bilateral 11/14/2018   Procedure: CYSTOSCOPY WITH RETROGRADE PYELOGRAM;  Surgeon: [name], MD;  Location: [facility];  Service: Urology;  Laterality: Bilateral;   CYSTOSCOPY W/ URETERAL STENT PLACEMENT Bilateral 11/21/2017   Procedure: CYSTOSCOPY WITH STENT REPLACEMENT;  Surgeon: [name], MD;  Location: [facility];  Service: Urology;  Laterality: Bilateral;   CYSTOSCOPY W/ URETERAL STENT PLACEMENT Bilateral 11/14/2018   Procedure: CYSTOSCOPY WITH STENT REPLACEMENT;  Surgeon: [name], MD;  Location: [facility];  Service: Urology;  Laterality: Bilateral;   CYSTOSCOPY W/ URETERAL STENT REMOVAL Bilateral 11/21/2017   Procedure: CYSTOSCOPY WITH STENT REMOVAL;  Surgeon: [name], MD;  Location: [facility];  Service: Urology;  Laterality: Bilateral;   CYSTOSCOPY WITH  STENT PLACEMENT Right 05/24/2017   Procedure: CYSTOSCOPY WITH STENT PLACEMENT;  Surgeon: [name], MD;  Location: [facility];  Service: Urology;  Laterality: Right;   CYSTOSCOPY WITH STENT PLACEMENT Bilateral 03/20/2018   Procedure: CYSTOSCOPY WITH STENT EXCHANGE;  Surgeon:  [name], MD;  Location: [facility];  Service: Urology;  Laterality: Bilateral;   IR NEPHROSTOGRAM LEFT THRU EXISTING ACCESS  07/21/2017   IR NEPHROSTOGRAM LEFT THRU EXISTING ACCESS  08/18/2017   IR NEPHROSTOGRAM RIGHT THRU EXISTING ACCESS  08/18/2017   IR NEPHROSTOMY PLACEMENT LEFT  06/15/2017   IR NEPHROSTOMY PLACEMENT RIGHT  06/14/2017   IR URETERAL STENT PLACEMENT EXISTING ACCESS LEFT  07/21/2017   PICC LINE INSERTION N/A 08/02/2017   Procedure: PICC LINE INSERTION;  Surgeon: Katha Cabal, MD;  Location: [place] [facility];  Service: Cardiovascular;  Laterality: N/A;   PROSTATE BIOPSY N/A 05/24/2017   Procedure: PROSTATE BIOPSY;  Surgeon: [name], MD;  Location: [facility];  Service: Urology;  Laterality: N/A;   TEE WITHOUT CARDIOVERSION N/A 07/31/2017   Procedure: TRANSESOPHAGEAL ECHOCARDIOGRAM (TEE);  Surgeon: Teodoro Spray, MD;  Location: [facility];  Service: Cardiovascular;  Laterality: N/A;   US ECHOCARDIOGRAPHY  12/2011   Mild LV dysfunction, EF=45%    HEMATOLOGY/ONCOLOGY HISTORY:  Oncology History   No history exists.    ALLERGIES:  is allergic to zytiga [abiraterone].  MEDICATIONS:  Current Outpatient Medications  Medication Sig Dispense Refill   amLODipine (NORVASC) 10 MG tablet Take 1 tablet (10 mg total) by mouth daily. (Patient taking differently: Take 10 mg by mouth every morning. ) 90 tablet 3   buPROPion (WELLBUTRIN SR) 150 MG 12 hr tablet Take 1 tablet (150 mg total) by mouth 2 (two) times daily. 180 tablet 4   Ca Carbonate-Mag Hydroxide (ROLAIDS PO) Take 1 tablet by mouth as needed.     diphenoxylate-atropine (LOMOTIL) 2.5-0.025 MG tablet Take 1 tablet by mouth 4 (four) times daily as needed for diarrhea or loose stools. 120 tablet 3   furosemide (LASIX) 20 MG tablet Take 1 tablet (20 mg total) by mouth daily as needed for edema. 90 tablet 3   HYDROcodone-acetaminophen (NORCO) 10-325 MG tablet Take 1 tablet by mouth every 3 (three) hours  as needed. For cancer related pain Dx: C61 120 tablet 0   ondansetron (ZOFRAN) 8 MG tablet Take 8 mg by mouth every 8 (eight) hours as needed. for nausea     Oxycodone HCl 10 MG TABS One tablet every six hours scheduled, and one every three hours as needed for breakthrough pain 120 tablet 0   XTANDI 40 MG capsule TAKE 4 CAPSULES (160 MG TOTAL) BY MOUTH DAILY. 120 capsule 0   No current facility-administered medications for this visit.     VITAL SIGNS: There were no vitals taken for this visit. There were no vitals filed for this visit.  Estimated body mass index is 20.03 kg/m as calculated from the following:   Height as of 01/22/19: 5' 11"  (1.803 m).   Weight as of 01/22/19: 143 lb 10.1 oz (65.2 kg).  LABS: CBC:    Component Value Date/Time   WBC 6.9 01/22/2019 0827   HGB 12.0 (L) 01/22/2019 0827   HCT 35.3 (L) 01/22/2019 0827   PLT 346 01/22/2019 0827   MCV 97.8 01/22/2019 0827   NEUTROABS 4.2 01/22/2019 0827   LYMPHSABS 1.8 01/22/2019 0827   MONOABS 0.6 01/22/2019 0827   EOSABS 0.2 01/22/2019 0827   BASOSABS 0.1 01/22/2019 0827  Comprehensive Metabolic Panel:    Component Value Date/Time   NA 138 01/22/2019 0827   NA 139 11/15/2016 0858   NA 137 01/20/2012 0615   K 4.6 01/22/2019 0827   K 4.1 01/20/2012 0615   CL 105 01/22/2019 0827   CL 103 01/20/2012 0615   CO2 22 01/22/2019 0827   CO2 24 01/20/2012 0615   BUN 26 (H) 01/22/2019 0827   BUN 35 (H) 06/23/2017 1037   BUN 15 01/20/2012 0615   CREATININE 1.51 (H) 01/22/2019 0827   CREATININE 2.95 (H) 04/24/2017 1107   GLUCOSE 116 (H) 01/22/2019 0827   GLUCOSE 101 (H) 01/20/2012 0615   CALCIUM 9.1 01/22/2019 0827   CALCIUM 8.9 01/20/2012 0615   AST 17 01/22/2019 0827   ALT 9 01/22/2019 0827   ALKPHOS 408 (H) 01/22/2019 0827   BILITOT 0.2 (L) 01/22/2019 0827   BILITOT 0.3 11/15/2016 0858   PROT 7.1 01/22/2019 0827   PROT 7.0 11/15/2016 0858   ALBUMIN 3.2 (L) 01/22/2019 0827   ALBUMIN 4.5 11/15/2016 0858     RADIOGRAPHIC STUDIES: No results found.  PERFORMANCE STATUS (ECOG) : 1 - Symptomatic but completely ambulatory  Review of Systems Unless otherwise noted, a complete review of systems is negative.  Physical Exam General: NAD, frail appearing, thin Cardiovascular: regular rate and rhythm Pulmonary: clear ant fields Abdomen: soft, nontender, + bowel sounds GU: no suprapubic tenderness Extremities: Trace bilateral lower extremity edema Skin: no rashes Neurological: Weakness but otherwise nonfocal  IMPRESSION: I met with patient today in the clinic to discuss goals and to address his pain management.  I introduced palliative care services and attempted to establish therapeutic rapport.  Patient reports severe and progressive right lower back pain over the past few weeks.  He describes pain as feeling "like a horse kicked me".  Pain often radiates down the right leg to the knee.  He denies weakness, bowel or bladder incontinence, falls, or difficulty ambulating.  Pain is exacerbated with movement but not fully relieved with rest.  He says pain is preventing him from engaging in activities he finds enjoyable.  Pain is inhibiting quality sleep.  Patient is currently on oxycodone 10 mg IR Q3H and Norco 10-325 mg Q3H. patient says that he has been taking the oxycodone and Norco regularly together as frequently as every 2 hours.  Discussed the risks of acetaminophen overdose as patient is likely exceeding doses of 3900 mg/day.  Patient says that the Norco and oxycodone initially helped but now poorly control the pain.  He finds little relief with the current regimen.  Previously tried opioid regimens include MSIR and transdermal fentanyl.    Patient denies adverse effects from the pain medication.  He has no confusion or sedation.  He has occasional constipation, which is relieved with as needed use of a laxative.  Patient has an appointment to see Dr. Baruch Gouty on Friday for consideration  of RT.  Patient is unsure if he will agree to start RT.  However, I encouraged him to consider it. RT may help improve patient's pain and may limit his need for frequent opioids.  We discussed goals.  He is adamant that he does not want future chemotherapy.  He seems to recognize that his cancer is progressing and will at some point likely be terminal.  He has previously been followed by hospice several years ago and says he would be interested in hospice at some point in the future but wants to "wait until it is time."  He clarifies that he is independent and does not want "someone waiting on me".  Discussed CODE STATUS.  Patient says that he does not want to be resuscitated or have his life prolonged artificially on machines.  He says he has a signed DNR order on his refrigerator and another one in his truck.  PDMP reviewed  PLAN: -Discontinue Norco -Start OxyContin 15 mg every 8 hours ( -Increase oxycodone IR 10 mg to 20 mg every 3 hours as needed for breakthrough pain -Trial of dexamethasone 4 mg p.o. daily -Would avoid NSAIDs due to recent history of AKI -Would consider trial of gabapentin due to possible neuropathic component -Naloxone kit -Prophylactic bowel regimen -DNR -RTC in 1 week   Patient expressed understanding and was in agreement with this plan. He also understands that He can call the clinic at any time with any questions, concerns, or complaints.     Time Total: 30 minutes  Visit consisted of counseling and education dealing with the complex and emotionally intense issues of symptom management and palliative care in the setting of serious and potentially life-threatening illness.Greater than 50%  of this time was spent counseling and coordinating care related to the above assessment and plan.  Signed by: [name], PhD, NP-C [phone] (Work Cell)

## 2019-01-24 ENCOUNTER — Telehealth: Payer: Self-pay | Admitting: *Deleted

## 2019-01-24 NOTE — Telephone Encounter (Signed)
Michael Small called stating the medicine for pain ordered by Sharion Dove, NP is not covered by insurance and is asking if something else can be ordered. Please advise

## 2019-01-25 ENCOUNTER — Ambulatory Visit
Admission: RE | Admit: 2019-01-25 | Discharge: 2019-01-25 | Disposition: A | Discharge status: Home / Self Care | Payer: Medicare Other | Source: Ambulatory Visit | Attending: Radiation Oncology | Admitting: Radiation Oncology

## 2019-01-25 ENCOUNTER — Other Ambulatory Visit: Payer: Self-pay

## 2019-01-25 ENCOUNTER — Encounter: Payer: Self-pay | Admitting: Radiation Oncology

## 2019-01-25 DIAGNOSIS — C7951 Secondary malignant neoplasm of bone: Secondary | ICD-10-CM | POA: Insufficient documentation

## 2019-01-25 DIAGNOSIS — C61 Malignant neoplasm of prostate: Secondary | ICD-10-CM | POA: Insufficient documentation

## 2019-01-25 DIAGNOSIS — I129 Hypertensive chronic kidney disease with stage 1 through stage 4 chronic kidney disease, or unspecified chronic kidney disease: Secondary | ICD-10-CM | POA: Insufficient documentation

## 2019-01-25 DIAGNOSIS — F17210 Nicotine dependence, cigarettes, uncomplicated: Secondary | ICD-10-CM | POA: Diagnosis not present

## 2019-01-25 DIAGNOSIS — Z192 Hormone resistant malignancy status: Secondary | ICD-10-CM | POA: Insufficient documentation

## 2019-01-25 DIAGNOSIS — N189 Chronic kidney disease, unspecified: Secondary | ICD-10-CM | POA: Insufficient documentation

## 2019-01-25 DIAGNOSIS — E785 Hyperlipidemia, unspecified: Secondary | ICD-10-CM | POA: Insufficient documentation

## 2019-01-25 DIAGNOSIS — F329 Major depressive disorder, single episode, unspecified: Secondary | ICD-10-CM | POA: Insufficient documentation

## 2019-01-25 DIAGNOSIS — Z79899 Other long term (current) drug therapy: Secondary | ICD-10-CM | POA: Insufficient documentation

## 2019-01-25 DIAGNOSIS — I252 Old myocardial infarction: Secondary | ICD-10-CM | POA: Diagnosis not present

## 2019-01-25 NOTE — Consult Note (Signed)
NEW PATIENT EVALUATION  Name: Michael Small  MRN: [id number]  Date:   01/25/2019     DOB: Sep 03, 1955   This 63 y.o. male patient presents to the clinic for initial evaluation of castrate resistant prostate cancer stage IV with significant right hip pain.  Patient has Gleason 9 (4+5) adenocarcinoma  REFERRING PHYSICIAN: Birdie Sons, MD  CHIEF COMPLAINT:  Chief Complaint  Patient presents with  . Cancer    INitial consultation bone mets    DIAGNOSIS: The encounter diagnosis was Metastasis to bone [facility]).   PREVIOUS INVESTIGATIONS:  Bone scan reviewed Clinical notes reviewed Pathology report reviewed  HPI: Patient is a 63 year old male with known stage IV castrate resistant prostate cancer Gleason 9 ((4+5) initially presenting with a PSA of 2338.  He has been treated with Gillermina Phy recently.  He has a bone scan showing widespread osseous and static metastatic disease.  His most significant pain is in the right hip region with bone scan showing involvement of bilateral hips and multiple areas in the pelvis consistent with metastatic disease.  Patient has been on androgen deprivation therapy although obviously he is castrate resistant at this time.  He has refused chemotherapy intervention is now referred to radiation oncology for consideration of palliative treatment.  PLANNED TREATMENT REGIMEN: Palliative radiation therapy to right hip as well as Xofigo  PAST MEDICAL HISTORY:  has a past medical history of Chronic kidney disease, Depression, GERD (gastroesophageal reflux disease), Headache, Hyperlipidemia, Hypertension, Myocardial infarction [facility]), Neuromuscular disorder ([place]), and Prostate cancer ([place]).    PAST SURGICAL HISTORY:  Past Surgical History:  Procedure Laterality Date  . CORONARY ANGIOPLASTY WITH STENT PLACEMENT  12/2011   [facility] L[facility] Cardiology  . CYSTOSCOPY W/ RETROGRADES Bilateral 05/24/2017   Procedure: CYSTOSCOPY WITH RETROGRADE PYELOGRAM;  Surgeon: [name], MD;  Location: [facility];  Service: Urology;  Laterality: Bilateral;  . CYSTOSCOPY W/ RETROGRADES Bilateral 11/21/2017   Procedure: CYSTOSCOPY WITH RETROGRADE PYELOGRAM;  Surgeon: [name], MD;  Location: [facility];  Service: Urology;  Laterality: Bilateral;  . CYSTOSCOPY W/ RETROGRADES Bilateral 03/20/2018   Procedure: CYSTOSCOPY WITH RETROGRADE PYELOGRAM;  Surgeon: [name], MD;  Location: [facility];  Service: Urology;  Laterality: Bilateral;  . CYSTOSCOPY W/ RETROGRADES Bilateral 11/14/2018   Procedure: CYSTOSCOPY WITH RETROGRADE PYELOGRAM;  Surgeon: [name], MD;  Location: [facility];  Service: Urology;  Laterality: Bilateral;  . CYSTOSCOPY W/ URETERAL STENT PLACEMENT Bilateral 11/21/2017   Procedure: CYSTOSCOPY WITH STENT REPLACEMENT;  Surgeon: [name], MD;  Location: [facility];  Service: Urology;  Laterality: Bilateral;  . CYSTOSCOPY W/ URETERAL STENT PLACEMENT Bilateral 11/14/2018   Procedure: CYSTOSCOPY WITH STENT REPLACEMENT;  Surgeon: [name], MD;  Location: [facility];  Service: Urology;  Laterality: Bilateral;  . CYSTOSCOPY W/ URETERAL STENT REMOVAL Bilateral 11/21/2017   Procedure: CYSTOSCOPY WITH STENT REMOVAL;  Surgeon: [name], MD;  Location: [facility];  Service: Urology;  Laterality: Bilateral;  . CYSTOSCOPY WITH STENT PLACEMENT Right 05/24/2017   Procedure: CYSTOSCOPY WITH STENT PLACEMENT;  Surgeon: [name], MD;  Location: [facility];  Service: Urology;  Laterality: Right;  . CYSTOSCOPY WITH STENT PLACEMENT Bilateral 03/20/2018   Procedure: CYSTOSCOPY WITH STENT EXCHANGE;  Surgeon: [name], MD;  Location: [facility];  Service: Urology;  Laterality: Bilateral;  . IR NEPHROSTOGRAM LEFT THRU EXISTING ACCESS  07/21/2017  . IR NEPHROSTOGRAM LEFT THRU EXISTING ACCESS  08/18/2017  . IR NEPHROSTOGRAM RIGHT THRU EXISTING ACCESS  08/18/2017  . IR NEPHROSTOMY PLACEMENT LEFT  06/15/2017  . IR NEPHROSTOMY PLACEMENT RIGHT  06/14/2017  . IR  URETERAL STENT PLACEMENT EXISTING ACCESS LEFT  07/21/2017  . PICC LINE INSERTION N/A 08/02/2017   Procedure: PICC LINE INSERTION;  Surgeon: Katha Cabal, MD;  Location: [place] [facility];  Service: Cardiovascular;  Laterality: N/A;  . PROSTATE BIOPSY N/A 05/24/2017   Procedure: PROSTATE BIOPSY;  Surgeon: [name], MD;  Location: [facility];  Service: Urology;  Laterality: N/A;  . TEE WITHOUT CARDIOVERSION N/A 07/31/2017   Procedure: TRANSESOPHAGEAL ECHOCARDIOGRAM (TEE);  Surgeon: Teodoro Spray, MD;  Location: [facility];  Service: Cardiovascular;  Laterality: N/A;  . US ECHOCARDIOGRAPHY  12/2011   Mild LV dysfunction, EF=45%    FAMILY HISTORY: family history includes Cancer in his mother and paternal grandfather; Diabetes in his father; Heart disease in his father; Hypertension in his mother.  SOCIAL HISTORY:  reports that he has been smoking cigars. He has a 24.00 pack-year smoking history. He has never used smokeless tobacco. He reports that he does not drink alcohol or use drugs.  ALLERGIES: Zytiga [abiraterone]  MEDICATIONS:  Current Outpatient Medications  Medication Sig Dispense Refill  . amLODipine (NORVASC) 10 MG tablet Take 1 tablet (10 mg total) by mouth daily. (Patient taking differently: Take 10 mg by mouth every morning. ) 90 tablet 3  . buPROPion (WELLBUTRIN SR) 150 MG 12 hr tablet Take 1 tablet (150 mg total) by mouth 2 (two) times daily. 180 tablet 4  . Ca Carbonate-Mag Hydroxide (ROLAIDS PO) Take 1 tablet by mouth as needed.    Marland Kitchen dexamethasone (DECADRON) 4 MG tablet Take 1 tablet (4 mg total) by mouth daily. 14 tablet 0  . naloxone (NARCAN) nasal spray 4 mg/0.1 mL 1 spray into nostril upon signs of opioid overdose. Call 911. May repeat once if no response within 2-3 minutes. 1 each 0  . ondansetron (ZOFRAN) 8 MG tablet Take 8 mg by mouth every 8 (eight) hours as needed. for nausea    . oxyCODONE (OXYCONTIN) 15 mg 12 hr tablet Take 1 tablet (15 mg total) by mouth  every 8 (eight) hours. 30 tablet 0  . Oxycodone HCl 10 MG TABS Take 1-2 tablets (10-20 mg total) by mouth every 3 (three) hours as needed. 120 tablet 0  . diphenoxylate-atropine (LOMOTIL) 2.5-0.025 MG tablet Take 1 tablet by mouth 4 (four) times daily as needed for diarrhea or loose stools. (Patient not taking: Reported on 01/23/2019) 120 tablet 3  . furosemide (LASIX) 20 MG tablet Take 1 tablet (20 mg total) by mouth daily as needed for edema. (Patient not taking: Reported on 01/23/2019) 90 tablet 3   No current facility-administered medications for this encounter.     ECOG PERFORMANCE STATUS:  1 - Symptomatic but completely ambulatory  REVIEW OF SYSTEMS: Patient denies any weight loss, fatigue, weakness, fever, chills or night sweats. Patient denies any loss of vision, blurred vision. Patient denies any ringing  of the ears or hearing loss. No irregular heartbeat. Patient denies heart murmur or history of fainting. Patient denies any chest pain or pain radiating to her upper extremities. Patient denies any shortness of breath, difficulty breathing at night, cough or hemoptysis. Patient denies any swelling in the lower legs. Patient denies any nausea vomiting, vomiting of blood, or coffee ground material in the vomitus. Patient denies any stomach pain. Patient states has had normal bowel movements no significant constipation or diarrhea. Patient denies any dysuria, hematuria or  significant nocturia. Patient denies any problems walking, swelling in the joints or loss of balance. Patient denies any skin changes, loss of hair or loss of weight. Patient denies any excessive worrying or anxiety or significant depression. Patient denies any problems with insomnia. Patient denies excessive thirst, polyuria, polydipsia. Patient denies any swollen glands, patient denies easy bruising or easy bleeding. Patient denies any recent infections, allergies or URI. Patient "s visual fields have not changed significantly in  recent time.   PHYSICAL EXAM: BP (!) (P) 169/78 (Patient Position: Sitting)   Pulse (P) 92   Temp (!) (P) 96.9 F (36.1 C) (Tympanic)   Resp (P) 18   Wt (P) 142 lb 14.4 oz (64.8 kg)   BMI (P) 19.93 kg/m  Range of motion of his lower extremities does not elicit pain motor or sensory and DTR levels are equal symmetric in the lower extremities.  Well-developed well-nourished patient in NAD. HEENT reveals PERLA, EOMI, discs not visualized.  Oral cavity is clear. No oral mucosal lesions are identified. Neck is clear without evidence of cervical or supraclavicular adenopathy. Lungs are clear to A&P. Cardiac examination is essentially unremarkable with regular rate and rhythm without murmur rub or thrill. Abdomen is benign with no organomegaly or masses noted. Motor sensory and DTR levels are equal and symmetric in the upper and lower extremities. Cranial nerves II through XII are grossly intact. Proprioception is intact. No peripheral adenopathy or edema is identified. No motor or sensory levels are noted. Crude visual fields are within normal range.  LABORATORY DATA: Pathology report reviewed    RADIOLOGY RESULTS: Bone scan reviewed compatible with above-stated findings   IMPRESSION: Castrate resistant stage IV prostate cancer in 63 year old male with right hip pain for palliative treatment.  PLAN: At this time is to go ahead with palliative radiation therapy to his right hip.  I will incorporate as much areas of abnormal bone scan in the lower pelvis as possible and treat 3000 cGy in 10 fractions.  Risks and benefits of treatment including skin reaction fatigue and possible slight chance of diarrhea all were discussed in detail with the patient.  I have personally set up and ordered CT simulation.  I also have discussed with him Xofigo.  I have discussed the risks and benefits and radiation safety precautions of radium-223.  I will start process to allow [place] to deliver her first dose in about a  month's time.  Patient comprehends my treatment plan well.  His blood counts are mildly anemic although I believe there will be sufficient for Xofigo  I would like to take this opportunity to thank you for allowing me to participate in the care of your patient.Noreene Filbert, MD

## 2019-01-25 NOTE — Telephone Encounter (Signed)
I spoke with patient's wife by phone. She picked up the OxyContin and other prescribed meds. However, the OxyContin was not fully covered by insurance and was expensive (over $200). We discussed options for either referral to SW to see if any assistance could be provided with his medications or trial of another LAO (e.g. MS Contin) when he is needs a refill.

## 2019-01-28 ENCOUNTER — Other Ambulatory Visit: Payer: Self-pay

## 2019-01-28 ENCOUNTER — Telehealth: Payer: Self-pay | Admitting: *Deleted

## 2019-01-28 ENCOUNTER — Ambulatory Visit
Admission: RE | Admit: 2019-01-28 | Discharge: 2019-01-28 | Disposition: A | Discharge status: Home / Self Care | Payer: Medicare Other | Source: Ambulatory Visit | Attending: Radiation Oncology | Admitting: Radiation Oncology

## 2019-01-28 DIAGNOSIS — C7951 Secondary malignant neoplasm of bone: Secondary | ICD-10-CM | POA: Insufficient documentation

## 2019-01-28 DIAGNOSIS — C61 Malignant neoplasm of prostate: Secondary | ICD-10-CM | POA: Insufficient documentation

## 2019-01-28 DIAGNOSIS — Z510 Encounter for antineoplastic radiation therapy: Secondary | ICD-10-CM | POA: Diagnosis not present

## 2019-01-28 NOTE — Telephone Encounter (Signed)
Family member called to be sure we have the correct number to call for his virtual appointment this week Please call [phone]. Number corrected in appointment notes

## 2019-01-29 ENCOUNTER — Other Ambulatory Visit: Payer: Self-pay

## 2019-01-29 ENCOUNTER — Encounter: Payer: Self-pay | Admitting: Hospice and Palliative Medicine

## 2019-01-29 DIAGNOSIS — C61 Malignant neoplasm of prostate: Secondary | ICD-10-CM | POA: Insufficient documentation

## 2019-01-29 DIAGNOSIS — C7951 Secondary malignant neoplasm of bone: Secondary | ICD-10-CM | POA: Diagnosis not present

## 2019-01-29 DIAGNOSIS — Z510 Encounter for antineoplastic radiation therapy: Secondary | ICD-10-CM | POA: Insufficient documentation

## 2019-01-29 NOTE — Progress Notes (Signed)
Patient wanted to let us know that Oxycontin is not covered by his insurance. However, they have "[facility] [facility]" for medications. He wanted to know if this could be done.

## 2019-01-30 ENCOUNTER — Inpatient Hospital Stay: Payer: Medicare Other | Attending: Hospice and Palliative Medicine | Admitting: Hospice and Palliative Medicine

## 2019-01-30 ENCOUNTER — Other Ambulatory Visit: Payer: Self-pay | Admitting: *Deleted

## 2019-01-30 DIAGNOSIS — C61 Malignant neoplasm of prostate: Secondary | ICD-10-CM | POA: Diagnosis not present

## 2019-01-30 DIAGNOSIS — G893 Neoplasm related pain (acute) (chronic): Secondary | ICD-10-CM | POA: Diagnosis not present

## 2019-01-30 DIAGNOSIS — Z515 Encounter for palliative care: Secondary | ICD-10-CM | POA: Diagnosis not present

## 2019-01-30 DIAGNOSIS — C7951 Secondary malignant neoplasm of bone: Secondary | ICD-10-CM

## 2019-01-30 MED ORDER — MORPHINE SULFATE ER 15 MG PO TBCR: 15.0000 mg | EXTENDED_RELEASE_TABLET | Freq: Three times a day (TID) | ORAL | 0 refills | Status: DC

## 2019-01-30 NOTE — Progress Notes (Signed)
Virtual Visit via Telephone Note  I connected with Michael Small on 01/30/19 at  2:00 PM EDT by telephone and verified that I am speaking with the correct person using two identifiers.   I discussed the limitations, risks, security and privacy concerns of performing an evaluation and management service by telephone and the availability of in person appointments. I also discussed with the patient that there may be a patient responsible charge related to this service. The patient expressed understanding and agreed to proceed.   History of Present Illness: Palliative Care consult requested for this 63 y.o. 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  He has bilateral ureteral obstruction status post stenting.  He has extensive bony metastases with evidence of disease progression on bone scan in April 2020.  PSA has been uptrending over the past several months.  Patient has declined chemotherapy.  Additionally, patient has had worsening pain and weight loss.  He was referred to palliative care to help address goals and manage ongoing symptoms.   Observations/Objective: I called and spoke with patient and wife to follow-up regarding his pain.  Patient says that his pain is completely relieved for about 4 hours following administration of OxyContin.  He then has some breakthrough pain, which is effectively managed with PRN use of oxycodone.  Overall, he says that the pain is significantly better on the current regimen. Patient denies any adverse effects from the pain medication.  Unfortunately, patient's insurance did not cover the cost of the OxyContin.  The cost is too high to pay out-of-pocket.  Patient is in agreement with rotating to MS Contin, which should be covered by insurance.  Assessment and Plan: Neoplasm related pain: -Discontinue OxyContin -Start MS Contin 15 mg every 8 hours -Continue oxycodone 10 to 20 mg  every 3 hours PRN for breakthrough pain -Prophylactic bowel regimen -Plan to start RT next week  Follow Up Instructions: We will plan to telephone visit next week for follow-up   I discussed the assessment and treatment plan with the patient. The patient was provided an opportunity to ask questions and all were answered. The patient agreed with the plan and demonstrated an understanding of the instructions.   The patient was advised to call back or seek an in-person evaluation if the symptoms worsen or if the condition fails to improve as anticipated.  I provided 10 minutes of non-face-to-face time during this encounter.   Irean Hong, NP

## 2019-02-04 ENCOUNTER — Encounter: Payer: Self-pay | Admitting: Hematology and Oncology

## 2019-02-05 ENCOUNTER — Other Ambulatory Visit: Payer: Self-pay

## 2019-02-05 ENCOUNTER — Inpatient Hospital Stay: Payer: Medicare Other | Admitting: Hematology and Oncology

## 2019-02-05 ENCOUNTER — Ambulatory Visit
Admission: RE | Admit: 2019-02-05 | Discharge: 2019-02-05 | Disposition: A | Discharge status: Home / Self Care | Payer: Medicare Other | Source: Ambulatory Visit | Attending: Radiation Oncology | Admitting: Radiation Oncology

## 2019-02-05 DIAGNOSIS — Z510 Encounter for antineoplastic radiation therapy: Secondary | ICD-10-CM | POA: Diagnosis not present

## 2019-02-05 DIAGNOSIS — C61 Malignant neoplasm of prostate: Secondary | ICD-10-CM | POA: Diagnosis not present

## 2019-02-05 DIAGNOSIS — C7951 Secondary malignant neoplasm of bone: Secondary | ICD-10-CM | POA: Diagnosis not present

## 2019-02-06 ENCOUNTER — Other Ambulatory Visit: Payer: Self-pay

## 2019-02-06 ENCOUNTER — Ambulatory Visit
Admission: RE | Admit: 2019-02-06 | Discharge: 2019-02-06 | Disposition: A | Discharge status: Home / Self Care | Payer: Medicare Other | Source: Ambulatory Visit | Attending: Radiation Oncology | Admitting: Radiation Oncology

## 2019-02-06 DIAGNOSIS — Z510 Encounter for antineoplastic radiation therapy: Secondary | ICD-10-CM | POA: Diagnosis not present

## 2019-02-06 DIAGNOSIS — C7951 Secondary malignant neoplasm of bone: Secondary | ICD-10-CM | POA: Diagnosis not present

## 2019-02-06 DIAGNOSIS — C61 Malignant neoplasm of prostate: Secondary | ICD-10-CM | POA: Diagnosis not present

## 2019-02-07 ENCOUNTER — Other Ambulatory Visit: Payer: Self-pay

## 2019-02-07 ENCOUNTER — Encounter: Payer: PRIVATE HEALTH INSURANCE | Admitting: Hospice and Palliative Medicine

## 2019-02-07 ENCOUNTER — Ambulatory Visit
Admission: RE | Admit: 2019-02-07 | Discharge: 2019-02-07 | Disposition: A | Discharge status: Home / Self Care | Payer: Medicare Other | Source: Ambulatory Visit | Attending: Radiation Oncology | Admitting: Radiation Oncology

## 2019-02-07 ENCOUNTER — Inpatient Hospital Stay (HOSPITAL_BASED_OUTPATIENT_CLINIC_OR_DEPARTMENT_OTHER): Payer: Medicare Other | Admitting: Hospice and Palliative Medicine

## 2019-02-07 DIAGNOSIS — C61 Malignant neoplasm of prostate: Secondary | ICD-10-CM | POA: Diagnosis not present

## 2019-02-07 DIAGNOSIS — R634 Abnormal weight loss: Secondary | ICD-10-CM

## 2019-02-07 DIAGNOSIS — G893 Neoplasm related pain (acute) (chronic): Secondary | ICD-10-CM | POA: Diagnosis not present

## 2019-02-07 DIAGNOSIS — Z510 Encounter for antineoplastic radiation therapy: Secondary | ICD-10-CM | POA: Diagnosis not present

## 2019-02-07 DIAGNOSIS — R197 Diarrhea, unspecified: Secondary | ICD-10-CM

## 2019-02-07 DIAGNOSIS — Z515 Encounter for palliative care: Secondary | ICD-10-CM | POA: Diagnosis not present

## 2019-02-07 DIAGNOSIS — C7951 Secondary malignant neoplasm of bone: Secondary | ICD-10-CM

## 2019-02-07 NOTE — Progress Notes (Signed)
Virtual Visit via Telephone Note  I connected with Michael Small on 02/07/19 at  2:00 PM EDT by telephone and verified that I am speaking with the correct person using two identifiers.   I discussed the limitations, risks, security and privacy concerns of performing an evaluation and management service by telephone and the availability of in person appointments. I also discussed with the patient that there may be a patient responsible charge related to this service. The patient expressed understanding and agreed to proceed.   History of Present Illness: Palliative Care consult requested for this 63 y.o. 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  He has bilateral ureteral obstruction status post stenting.  He has extensive bony metastases with evidence of disease progression on bone scan in April 2020.  PSA has been uptrending over the past several months.  Patient has declined chemotherapy.  Additionally, patient has had worsening pain and weight loss.  He was referred to palliative care to help address goals and manage ongoing symptoms.   Observations/Objective: I called and spoke with patient and wife to follow-up regarding his pain.    Patient reports that pain is stable with use of MS Contin and oxycodone.  He is still taking oxycodone regularly throughout the day.  Would consider increasing dose of MS Contin to 30 mg every 8 hours to lessen need for as needed medication.  Patient says that he has had some diarrhea that he associates with RT.  He is taking Lomotil but only once or twice a day.  We discussed maximizing that medication.  He plans to speak with Dr. Baruch Gouty regarding the course of radiation and thinks that he might discontinue further treatment unless his symptoms improve. I encouraged him to allow [place] to try other methods to improve diarrhea before making final treatment decisions.   Patient asked  for the conversation to be cut short as he was in a hurry to leave the house.   Assessment and Plan: Neoplasm related pain: -Continue MS Contin 15 mg every 8 hours -Continue oxycodone 10 to 20 mg every 3 hours PRN for breakthrough pain -Lomotil as needed for diarrhea  Follow Up Instructions: We will plan to telephone visit in 1-2 weeks for follow-up   I discussed the assessment and treatment plan with the patient. The patient was provided an opportunity to ask questions and all were answered. The patient agreed with the plan and demonstrated an understanding of the instructions.   The patient was advised to call back or seek an in-person evaluation if the symptoms worsen or if the condition fails to improve as anticipated.  I provided 10 minutes of non-face-to-face time during this encounter.   Irean Hong, NP

## 2019-02-08 ENCOUNTER — Ambulatory Visit
Admission: RE | Admit: 2019-02-08 | Discharge: 2019-02-08 | Disposition: A | Discharge status: Home / Self Care | Payer: Medicare Other | Source: Ambulatory Visit | Attending: Radiation Oncology | Admitting: Radiation Oncology

## 2019-02-08 ENCOUNTER — Other Ambulatory Visit: Payer: Self-pay

## 2019-02-08 DIAGNOSIS — Z510 Encounter for antineoplastic radiation therapy: Secondary | ICD-10-CM | POA: Diagnosis not present

## 2019-02-08 DIAGNOSIS — C7951 Secondary malignant neoplasm of bone: Secondary | ICD-10-CM | POA: Diagnosis not present

## 2019-02-08 DIAGNOSIS — C61 Malignant neoplasm of prostate: Secondary | ICD-10-CM | POA: Diagnosis not present

## 2019-02-08 NOTE — Progress Notes (Deleted)
Patient wife would like to cancel mondays appointment and reschedule if they can until after his radiation treatments. He is very exhausted and having multiple appointments in one day he cant keep up due to being tired.

## 2019-02-11 ENCOUNTER — Ambulatory Visit: Payer: Medicare Other | Admitting: Hematology and Oncology

## 2019-02-11 ENCOUNTER — Ambulatory Visit: Payer: Medicare Other

## 2019-02-12 ENCOUNTER — Ambulatory Visit: Payer: Medicare Other

## 2019-02-12 NOTE — Progress Notes (Signed)
[facility]  [address] [place], [place] [address] Phone: [phone]  Fax: [phone]   Telemedicine Office Visit:  02/14/2019  Referring physician: Birdie Sons, MD  I connected with Michael Small on 02/14/2019 at 4:50 PM by videoconferencing and verified that I was speaking with the correct person using 2 identifiers.  The patient was at home.  I discussed the limitations, risk, security and privacy concerns of performing an evaluation and management service by videoconferencing and the availability of in person appointments.  I also discussed with the patient that there may be a patient responsible charge related to this service.  The patient expressed understanding and agreed to proceed.   Chief Complaint: Michael Small is a 63 y.o. male  with metastatic prostate cancer who is seen for a 4 week assessment.   HPI: The patient was last seen in the oncology clinic on 01/22/2019.  At that time, he had increasing pain; he had increased his pain medications.  Pain was predominantly in his right hip but extends to his knee. Hematocrit was 35.3. Hemoglobin was 12.0.  Creatinine was 1.51.  Albumin was 3.2.  Alkaline phosphatase was 408.  PSA was 404.  He was seen by [name], NP on 01/23/2019.  His Norco was discontinued.  He started OxyContin 15 mg every 8 hours. His oxycodone IR was increased 10 to 20 mg every 3 hours prn. He was given a trial of dexamethasone 4 mg p.o. daily.   He was seen for initial consultation by Dr. Baruch Gouty on 01/25/2019.  Palliative radiation therapy to his right hip was discussed.  Plan was to treat as much of the abnormal bone scan in lower pelvis as possible with 3000 cGy in 10 fractions. His blood counts were felt sufficient for Xofigo 1 month later.   He was seen by Derrek Monaco, NP on 01/30/2019. His OxyContin was discontinued. He started MS Contin 15 mg every 8 hours.  He continued oxycodone 10 to 20 mg every 3  hours prn.   During the interim, the patient notes that his pain is improving. Patient has whole body pain (8/10). He is taking MS Contin 15 mg every 8 hours, and oxycodone 10 mg every 3 hours prn. He notes low energy, and left lower extremity leg swelling. His wife notes that the swelling has caused his skin to "stretch".  He had daily radiation treatments x 3 (02/06/2019 -02/08/2019). He decided to stop radiation because he felt "sleepy" all the time.  He notes falling asleep while driving.  His wife has concerns about the side effects of radiation.    Past Medical History:  Diagnosis Date  . 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 Depression   . GERD (gastroesophageal reflux disease)    occ   . Headache    migraines occ  . Hyperlipidemia   . Hypertension   . Myocardial infarction [facility])    s/p stent  . Neuromuscular disorder (HCC)    numbness in arms and legs  . Prostate cancer [facility])     Past Surgical History:  Procedure Laterality Date  . CORONARY ANGIOPLASTY WITH STENT PLACEMENT  12/2011   DES LAD [facility]  . CYSTOSCOPY W/ RETROGRADES Bilateral 05/24/2017   Procedure: CYSTOSCOPY WITH RETROGRADE PYELOGRAM;  Surgeon: [name], MD;  Location: [facility] ORS;  Service: Urology;  Laterality: Bilateral;  . CYSTOSCOPY W/ RETROGRADES Bilateral 11/21/2017   Procedure: CYSTOSCOPY WITH RETROGRADE  PYELOGRAM;  Surgeon: [name], MD;  Location: [facility] OR[facility];  Service: Urology;  Laterality: Bilateral;  . CYSTOSCOPY W/ RETROGRADES Bilateral 03/20/2018   Procedure: CYSTOSCOPY WITH RETROGRADE PYELOGRAM;  Surgeon: [name], MD;  Location: [facility] OR[facility];  Service: Urology;  Laterality: Bilateral;  . CYSTOSCOPY W/ RETROGRADES Bilateral 11/14/2018   Procedure: CYSTOSCOPY WITH RETROGRADE PYELOGRAM;  Surgeon: [name], MD;  Location: [facility];  Service: Urology;  Laterality: Bilateral;  . CYSTOSCOPY W/ URETERAL STENT PLACEMENT Bilateral 11/21/2017   Procedure:  CYSTOSCOPY WITH STENT REPLACEMENT;  Surgeon: [name], MD;  Location: [facility];  Service: Urology;  Laterality: Bilateral;  . CYSTOSCOPY W/ URETERAL STENT PLACEMENT Bilateral 11/14/2018   Procedure: CYSTOSCOPY WITH STENT REPLACEMENT;  Surgeon: [name], MD;  Location: [facility];  Service: Urology;  Laterality: Bilateral;  . CYSTOSCOPY W/ URETERAL STENT REMOVAL Bilateral 11/21/2017   Procedure: CYSTOSCOPY WITH STENT REMOVAL;  Surgeon: [name], MD;  Location: [facility];  Service: Urology;  Laterality: Bilateral;  . CYSTOSCOPY WITH STENT PLACEMENT Right 05/24/2017   Procedure: CYSTOSCOPY WITH STENT PLACEMENT;  Surgeon: [name], MD;  Location: [facility];  Service: Urology;  Laterality: Right;  . CYSTOSCOPY WITH STENT PLACEMENT Bilateral 03/20/2018   Procedure: CYSTOSCOPY WITH STENT EXCHANGE;  Surgeon: [name], MD;  Location: [facility];  Service: Urology;  Laterality: Bilateral;  . IR NEPHROSTOGRAM LEFT THRU EXISTING ACCESS  07/21/2017  . IR NEPHROSTOGRAM LEFT THRU EXISTING ACCESS  08/18/2017  . IR NEPHROSTOGRAM RIGHT THRU EXISTING ACCESS  08/18/2017  . IR NEPHROSTOMY PLACEMENT LEFT  06/15/2017  . IR NEPHROSTOMY PLACEMENT RIGHT  06/14/2017  . IR URETERAL STENT PLACEMENT EXISTING ACCESS LEFT  07/21/2017  . PICC LINE INSERTION N/A 08/02/2017   Procedure: PICC LINE INSERTION;  Surgeon: Katha Cabal, MD;  Location: [place] [facility];  Service: Cardiovascular;  Laterality: N/A;  . PROSTATE BIOPSY N/A 05/24/2017   Procedure: PROSTATE BIOPSY;  Surgeon: [name], MD;  Location: [facility];  Service: Urology;  Laterality: N/A;  . TEE WITHOUT CARDIOVERSION N/A 07/31/2017   Procedure: TRANSESOPHAGEAL ECHOCARDIOGRAM (TEE);  Surgeon: Teodoro Spray, MD;  Location: [facility];  Service: Cardiovascular;  Laterality: N/A;  . US ECHOCARDIOGRAPHY  12/2011   Mild LV dysfunction, EF=45%    Family History  Problem Relation Age of Onset  . Cancer Mother   . Hypertension  Mother   . Diabetes Father   . Heart disease Father   . Cancer Paternal Grandfather   . Prostate cancer Neg Hx   . Bladder Cancer Neg Hx   . Kidney cancer Neg Hx     Social History:  reports that he has been smoking cigars. He has a 24.00 pack-year smoking history. He has never used smokeless tobacco. He reports that he does not drink alcohol or use drugs. He has smoked 1 pack per day since age 60 (30 years). He smokes small cigars. He does not drink alcohol, but previously drank heavily. He describes being exposed to Round Up and asbestos. He has been disabled for several years secondary to "nerve damage and problems in his legs, arms, and bad back". He fell off a ladder. He previously worked for a [employer], [employer]. He lives in [place]. He has step children.His wife contact info (cell: [phone]). The patient is accompanied by his wife today.   Participants in the patient's visit and their role in the encounter included the patient, his wife, and Vito Berger, CMA, today.  The intake visit was provided by Vito Berger, CMA.  Allergies:  Allergies  Allergen Reactions  . Zytiga [Abiraterone] Diarrhea    Current Medications: Current Outpatient Medications  Medication Sig Dispense Refill  . amLODipine (NORVASC) 10 MG tablet Take 1 tablet (10 mg total) by mouth daily. (Patient taking differently: Take 10 mg by mouth every morning. ) 90 tablet 3  . buPROPion (WELLBUTRIN SR) 150 MG 12 hr tablet Take 1 tablet (150 mg total) by mouth 2 (two) times daily. 180 tablet 4  . Ca Carbonate-Mag Hydroxide (ROLAIDS PO) Take 1 tablet by mouth as needed.    Marland Kitchen dexamethasone (DECADRON) 4 MG tablet Take 1 tablet (4 mg total) by mouth daily. 14 tablet 0  . morphine (MS CONTIN) 15 MG 12 hr tablet Take 1 tablet (15 mg total) by mouth every 8 (eight) hours. 45 tablet 0  . ondansetron (ZOFRAN) 8 MG tablet Take 8 mg by mouth every 8 (eight) hours as needed. for nausea    . Oxycodone HCl  10 MG TABS Take 1-2 tablets (10-20 mg total) by mouth every 3 (three) hours as needed. 120 tablet 0  . diphenoxylate-atropine (LOMOTIL) 2.5-0.025 MG tablet Take 1 tablet by mouth 4 (four) times daily as needed for diarrhea or loose stools. (Patient not taking: Reported on 02/14/2019) 120 tablet 3  . furosemide (LASIX) 20 MG tablet Take 1 tablet (20 mg total) by mouth daily as needed for edema. (Patient not taking: Reported on 01/29/2019) 90 tablet 3  . naloxone (NARCAN) nasal spray 4 mg/0.1 mL 1 spray into nostril upon signs of opioid overdose. Call 911. May repeat once if no response within 2-3 minutes. (Patient not taking: Reported on 01/29/2019) 1 each 0   No current facility-administered medications for this visit.     Review of Systems  Constitutional: Positive for malaise/fatigue (no energy). Negative for chills, fever and weight loss.       Feels sleepy all of the time.  HENT: Negative.  Negative for congestion, ear pain, nosebleeds, sinus pain, sore throat and tinnitus.   Eyes: Negative.  Negative for blurred vision, double vision, photophobia and pain.  Respiratory: Negative.  Negative for cough, hemoptysis, sputum production, shortness of breath and wheezing.   Cardiovascular: Positive for leg swelling (left lower extremity.). Negative for chest pain, palpitations, orthopnea and PND.  Gastrointestinal: Negative.  Negative for abdominal pain, constipation, diarrhea, heartburn, nausea and vomiting.  Genitourinary: Negative for dysuria, frequency and urgency.       Nephrostomy tube exchange in 10/2018.  Musculoskeletal: Negative for joint pain.       Whole body pain.  Skin: Negative.  Negative for itching and rash.  Neurological: Positive for weakness (generalized). Negative for dizziness, tremors, sensory change, speech change, focal weakness and headaches.  Endo/Heme/Allergies: Bruises/bleeds easily.  Psychiatric/Behavioral: Negative.  Negative for depression and memory loss. The patient  is not nervous/anxious and does not have insomnia.   All other systems reviewed and are negative.   Performance status (ECOG):  2  Physical Exam  Constitutional: He is oriented to person, place, and time. He appears well-developed and well-nourished. No distress.  Chronically fatigued appearing gentleman.  HENT:  Head: Normocephalic and atraumatic.  Mouth/Throat: No oropharyngeal exudate.  Near alopecia.  Graying light brown beard.  Eyes: Conjunctivae and EOM are normal.  Neurological: He is alert and oriented to person, place, and time. He has normal reflexes.  Skin: He is not diaphoretic.  Psychiatric: He has a normal mood and affect. His  behavior is normal. Judgment and thought content normal.  Nursing note and vitals reviewed.   No visits with results within 3 Day(s) from this visit.  Latest known visit with results is:  Appointment on 01/22/2019  Component Date Value Ref Range Status  . Prostatic Specific Antigen 01/22/2019 404.00* 0.00 - 4.00 ng/mL Final   Comment: (NOTE) While PSA levels of <=4.0 ng/ml are reported as reference range, some men with levels below 4.0 ng/ml can have prostate cancer and many men with PSA above 4.0 ng/ml do not have prostate cancer.  Other tests such as free PSA, age specific reference ranges, PSA velocity and PSA doubling time may be helpful especially in men less than 68 years old. Performed at [facility], [place] [address] [place], [place] [address]   . Sodium 01/22/2019 138  135 - 145 mmol/L Final  . Potassium 01/22/2019 4.6  3.5 - 5.1 mmol/L Final  . Chloride 01/22/2019 105  98 - 111 mmol/L Final  . CO2 01/22/2019 22  22 - 32 mmol/L Final  . Glucose, Bld 01/22/2019 116* 70 - 99 mg/dL Final  . BUN 01/22/2019 26* 8 - 23 mg/dL Final  . Creatinine, Ser 01/22/2019 1.51* 0.61 - 1.24 mg/dL Final  . Calcium 01/22/2019 9.1  8.9 - 10.3 mg/dL Final  . Total Protein 01/22/2019 7.1  6.5 - 8.1 g/dL Final  . Albumin 01/22/2019 3.2* 3.5 - 5.0  g/dL Final  . AST 01/22/2019 17  15 - 41 U/L Final  . ALT 01/22/2019 9  0 - 44 U/L Final  . Alkaline Phosphatase 01/22/2019 408* 38 - 126 U/L Final  . Total Bilirubin 01/22/2019 0.2* 0.3 - 1.2 mg/dL Final  . GFR calc non Af Amer 01/22/2019 48* >60 mL/min Final  . GFR calc Af Amer 01/22/2019 56* >60 mL/min Final  . Anion gap 01/22/2019 11  5 - 15 Final   Performed at [facility], [address] [place], [place] [address]  . WBC 01/22/2019 6.9  4.0 - 10.5 K/uL Final  . RBC 01/22/2019 3.61* 4.22 - 5.81 MIL/uL Final  . Hemoglobin 01/22/2019 12.0* 13.0 - 17.0 g/dL Final  . HCT 01/22/2019 35.3* 39.0 - 52.0 % Final  . MCV 01/22/2019 97.8  80.0 - 100.0 fL Final  . MCH 01/22/2019 33.2  26.0 - 34.0 pg Final  . MCHC 01/22/2019 34.0  30.0 - 36.0 g/dL Final  . RDW 01/22/2019 14.8  11.5 - 15.5 % Final  . Platelets 01/22/2019 346  150 - 400 K/uL Final  . nRBC 01/22/2019 0.0  0.0 - 0.2 % Final  . Neutrophils Relative % 01/22/2019 61  % Final  . Neutro Abs 01/22/2019 4.2  1.7 - 7.7 K/uL Final  . Lymphocytes Relative 01/22/2019 26  % Final  . Lymphs Abs 01/22/2019 1.8  0.7 - 4.0 K/uL Final  . Monocytes Relative 01/22/2019 9  % Final  . Monocytes Absolute 01/22/2019 0.6  0.1 - 1.0 K/uL Final  . Eosinophils Relative 01/22/2019 2  % Final  . Eosinophils Absolute 01/22/2019 0.2  0.0 - 0.5 K/uL Final  . Basophils Relative 01/22/2019 1  % Final  . Basophils Absolute 01/22/2019 0.1  0.0 - 0.1 K/uL Final  . Immature Granulocytes 01/22/2019 1  % Final  . Abs Immature Granulocytes 01/22/2019 0.04  0.00 - 0.07 K/uL Final   Performed at [facility], [address], [place], [place] [address]    Assessment:  Michael Small is a  63 y.o. male with metastaticprostate cancers/p transrectal prostate biopsies and cystoscopy on 05/24/2017. Prostate biopsies revealed acinar adenocarcinoma involving all 12 cores. Adenocarcinoma involved colonic mucosa and skeletal muscle. Gleason score  was 9 (4+5), grade group 5. Pathologic stagewas T4N1M1c. PSAwas 2338.8 on 04/24/2017.  OmniSeq testing on 12/26/2018 revealed MSI stable, PD-L1 0%, TMB 3.5/Mb (low) and negative ATM mutation, BRCA1/2 mutation, and NTRK fusion.  There was TMPRSS2e1-ERGe5 fusion and TP53 c.818G>T (R273L).  Abdomen and pelvic CT on 04/27/2017 revealed prominent pelvic adenopathy with pathologic retroperitoneal adenopathy, abnormal retroperitoneal stranding, and abnormal inflammatory type stranding along the pelvic sidewalls and perirectal space. A representative right external iliac lymph node measured 3.2 cm. A left external iliac lymph node measured 2.1 cm. There was conglomerate left periaortic adenopathy 2.1 cm and a 1.4 cm aortocaval node. There was abnormal adenopathy in the perirectal spacewith prominent wall thickening in the rectumand moderate wall thickening in the sigmoid colon. The findings along the pelvic sidewalls appeared to be causingdistal ureteral obstructionresulting in moderatebilateral hydronephrosisand hydroureter. Lymphoma or prostate cancer was favored. A right external iliac node measured 3.2 cm. There was bladder wall thickeningsuggesting cystitis. There was prostatomegaly.  Bone scanon 06/22/2017 revealed findings consistent with diffuse metastatic disease. There was activity in right temporal area, sternum, anterior and posterior ribs bilaterally, and entire spine.  He received Lupron 22.5 mg every 3 months (06/01/2017; 10/02/2017; last07/20/2020) and Degarelix240 mg on 06/15/2017. He received Casodexfrom 06/13/2017 - 06/15/2017.   He began abirateroneon 02/28/2018 and discontinued after 2 days secondary to nausea, vomiting, diarrhea, and fatigue. He restarted abiraterone(1 pill/day) on 03/16/2018 and increased to 2 pills/day on 03/23/2018. He increased to 4 tablets in mid 03/2018. He began enzalutamideon05/03/2019.He has been on full dose enzalutamide since  10/17/2018.Testosteronewas 98 on 06/14/2017, <3 on 09/07/2017, and <3 on 03/16/2018.  PSAhas been followed: 2338.8 on 04/24/2017, 3603.0 on 06/23/2017, 1035 on 07/26/2017, 241 on 09/07/2017, 342 on 01/23/2018, 107.14 on 04/24/2018, 116.03 on 05/22/2018, 116.52 on 06/27/2018, 101.37 on 07/26/2018, 180.0 on 08/28/2018, 113.85 on 10/26/2018, 219 on 11/20/2018, 412 on 12/17/2018, and 404 on 01/22/2019.  Abdomen and pelvic CTon 09/11/2018 revealed no acute findings.Bone scanon 09/11/2018 revealed worsening bony metastatic diseasein theright humerus, left humerus, right side of the calvarium, the left side of the manubrium skin, and in the bilateral femurs.There was persistent metastatic disease in the pelvic bones, sternum, andspine.There wassubtle persistent metastases in the posterior ribs bilaterally. Themetastasis in a medial left posterior rib wasmore prominent.The lack of soft tissue uptakewasconsistent with asuper scandue to the number metastases.  He received palliative radiation x 3 fractions (02/06/2019 -02/08/2019) to the lower pelvis.  He discontinued radiation (10 fractions were planned).  He was admitted to [facility] 06/12/2017 - 06/15/2017 with hyperkalemia and acute renal failure. Creatinine was 7.23. He underwent emergent dialysis x1. He is s/p bilateral external nephrostomy tubeplacement on 06/14/2017(laststent exchange06/17/2020).  He was admitted to [facility] 07/28/2017 - 07/31/2017 with sepsis secondary to UTI with MSSA bacteremia.   Bilateral lower extremity duplexon 06/12/2017 revealed no evidence of DVT. Code statusis DNR/DNI.  Symptomatically, he is fatigued.  He has no energy.  Pain is better controlled.  He has declined further external beam radiation, but wishes to consider Xofigo.  He has new left lower extremity edema.  Plan: 1.   Metastatic prostate cancer Clinically, he has progressive disease. He has  extensive bony metastasis. He has progressed on enzalutamide. PSA has correlated with disease status.  PSA was 404 on 01/22/2019. Patient remains uninterested in chemotherapy.  He received 3 of 10 fractions of palliative radiation to his lower pelvis.              Patient declines further treatment.   Review typical side effects associated with radiation.  Discuss patient's thoughts about Xofigo.   Patient is interested in considering.   Follow-up with Dr Baruch Gouty. 2.Cancer-related pain Pain medications have been adjusted by [name], NP in palliative care.  Currently he is taking MS Contin scheduled and oxycodone prn breakthrough pain.   He is taking 8 breakthough pills/day.  Follow-up with Billey Chang, NP regarding further adjustment in long acting medication. 3.Left lower extremity edema             Discuss concern for possible DVT.  Left lower extremity duplex tomorrow (rather than today) per patient request.  Contact patient with results of imaging. 4.   RTC in 1 month for MD assessment (Doximity or in person) and labs (CBC with diff, CMP, PSA).  I discussed the assessment and treatment plan with the patient.  The patient was provided an opportunity to ask questions and all were answered.  The patient agreed with the plan and demonstrated an understanding of the instructions.  The patient was advised to call back or seek an in person evaluation if the symptoms worsen or if the condition fails to improve as anticipated.  I provided 19 minutes (4:50 PM - 5:09 PM) of face-to-face video visit time during this this encounter and > 50% was spent counseling as documented under my assessment and plan.  I provided these services from the [facility] office.   Nolon Stalls, MD, PhD  02/14/2019, 4:50 PM  I, Selena Batten, am acting as scribe for [employer]. Mike Gip, MD, PhD.  I, Melissa C. Mike Gip, MD,  have reviewed the above documentation for accuracy and completeness, and I agree with the above.

## 2019-02-13 ENCOUNTER — Ambulatory Visit: Payer: Medicare Other

## 2019-02-13 ENCOUNTER — Inpatient Hospital Stay: Payer: Medicare Other

## 2019-02-14 ENCOUNTER — Encounter: Payer: Self-pay | Admitting: Hematology and Oncology

## 2019-02-14 ENCOUNTER — Inpatient Hospital Stay: Payer: Medicare Other | Attending: Hematology and Oncology | Admitting: Hematology and Oncology

## 2019-02-14 ENCOUNTER — Ambulatory Visit: Payer: Medicare Other

## 2019-02-14 DIAGNOSIS — R600 Localized edema: Secondary | ICD-10-CM

## 2019-02-14 DIAGNOSIS — R531 Weakness: Secondary | ICD-10-CM

## 2019-02-14 DIAGNOSIS — C7951 Secondary malignant neoplasm of bone: Secondary | ICD-10-CM | POA: Diagnosis not present

## 2019-02-14 DIAGNOSIS — Z923 Personal history of irradiation: Secondary | ICD-10-CM | POA: Diagnosis not present

## 2019-02-14 DIAGNOSIS — G893 Neoplasm related pain (acute) (chronic): Secondary | ICD-10-CM | POA: Diagnosis not present

## 2019-02-14 DIAGNOSIS — C61 Malignant neoplasm of prostate: Secondary | ICD-10-CM

## 2019-02-14 DIAGNOSIS — R5383 Other fatigue: Secondary | ICD-10-CM | POA: Diagnosis not present

## 2019-02-14 DIAGNOSIS — I10 Essential (primary) hypertension: Secondary | ICD-10-CM | POA: Diagnosis not present

## 2019-02-14 DIAGNOSIS — Z7189 Other specified counseling: Secondary | ICD-10-CM

## 2019-02-14 NOTE — Progress Notes (Signed)
The patient c/o whole body pain ( 8 )

## 2019-02-15 ENCOUNTER — Ambulatory Visit: Payer: Medicare Other | Admitting: Family Medicine

## 2019-02-15 ENCOUNTER — Other Ambulatory Visit: Payer: Self-pay | Admitting: Radiation Oncology

## 2019-02-15 ENCOUNTER — Ambulatory Visit: Payer: Medicare Other

## 2019-02-15 DIAGNOSIS — C7951 Secondary malignant neoplasm of bone: Secondary | ICD-10-CM

## 2019-02-15 DIAGNOSIS — C61 Malignant neoplasm of prostate: Secondary | ICD-10-CM

## 2019-02-17 ENCOUNTER — Encounter: Payer: Self-pay | Admitting: Family Medicine

## 2019-02-18 ENCOUNTER — Telehealth: Payer: Self-pay | Admitting: Family Medicine

## 2019-02-18 ENCOUNTER — Ambulatory Visit: Payer: Medicare Other

## 2019-02-18 ENCOUNTER — Other Ambulatory Visit: Payer: Self-pay | Admitting: *Deleted

## 2019-02-18 ENCOUNTER — Encounter: Payer: Self-pay | Admitting: Family Medicine

## 2019-02-18 DIAGNOSIS — G893 Neoplasm related pain (acute) (chronic): Secondary | ICD-10-CM

## 2019-02-18 DIAGNOSIS — C7951 Secondary malignant neoplasm of bone: Secondary | ICD-10-CM

## 2019-02-18 MED ORDER — AMOXICILLIN-POT CLAVULANATE 875-125 MG PO TABS: 1.0000 | ORAL_TABLET | Freq: Two times a day (BID) | ORAL | 0 refills | Status: DC

## 2019-02-18 NOTE — Telephone Encounter (Signed)
Please advise that I sent prescription for [facility] [address], but he has starts having trouble breathing he needs to go to the emergency room.

## 2019-02-19 ENCOUNTER — Ambulatory Visit: Payer: Medicare Other

## 2019-02-19 MED ORDER — MORPHINE SULFATE ER 15 MG PO TBCR: 15.0000 mg | EXTENDED_RELEASE_TABLET | Freq: Three times a day (TID) | ORAL | 0 refills | Status: AC

## 2019-02-19 MED ORDER — OXYCODONE HCL 10 MG PO TABS: 10.0000 mg | ORAL_TABLET | ORAL | 0 refills | Status: AC | PRN

## 2019-02-19 NOTE — Telephone Encounter (Signed)
LMTCB ED 

## 2019-02-21 ENCOUNTER — Inpatient Hospital Stay (HOSPITAL_BASED_OUTPATIENT_CLINIC_OR_DEPARTMENT_OTHER): Payer: Medicare Other | Admitting: Hospice and Palliative Medicine

## 2019-02-21 DIAGNOSIS — Z515 Encounter for palliative care: Secondary | ICD-10-CM

## 2019-02-21 DIAGNOSIS — R0602 Shortness of breath: Secondary | ICD-10-CM | POA: Diagnosis not present

## 2019-02-21 DIAGNOSIS — C61 Malignant neoplasm of prostate: Secondary | ICD-10-CM | POA: Diagnosis not present

## 2019-02-21 DIAGNOSIS — G893 Neoplasm related pain (acute) (chronic): Secondary | ICD-10-CM | POA: Diagnosis not present

## 2019-02-21 NOTE — Progress Notes (Signed)
Virtual Visit via Telephone Note  I connected with Michael Small on 02/21/19 at  1:00 PM EDT by telephone and verified that I am speaking with the correct person using two identifiers.   I discussed the limitations, risks, security and privacy concerns of performing an evaluation and management service by telephone and the availability of in person appointments. I also discussed with the patient that there may be a patient responsible charge related to this service. The patient expressed understanding and agreed to proceed.   History of Present Illness: Palliative Care consult requested for this 63 y.o. 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  He has bilateral ureteral obstruction status post stenting.  He has extensive bony metastases with evidence of disease progression on bone scan in April 2020.  PSA has been uptrending over the past several months.  Patient has declined chemotherapy.  Additionally, patient has had worsening pain and weight loss.  He was referred to palliative care to help address goals and manage ongoing symptoms.   Observations/Objective: I called and spoke with patient and wife to follow-up regarding his pain.    Patient did not feel like talking as he was "sick".  Instead I spoke with patient's wife.  Wife says that patient's pain is stable on current regimen.  She says the patient has had several days of pulmonary congestion, cough, shortness of breath and fevers.  I note that patient has been started on oral antibiotics by his PCP.  Patient feels that the shortness of breath is slightly improved today.  I recommended that patient obtain work-up for COVID-19 but he declined.    I also spoke with Dr. Mike Gip who is concerned about risk of PE.  Patient was scheduled for lower extremity Dopplers but does not appear to have had them done.  I called patient back and relayed the recommendation  for work-up in the ER for consideration of lower extremity Dopplers and CTA.  However, patient declined ER eval and says that he would reconsider should symptoms worsen.  Wife and patient verbalized understanding of risks associated with thrombotic events including debility and death.  Assessment and Plan: Neoplasm related pain: -Continue MS Contin 15 mg every 8 hours -Continue oxycodone 10 to 20 mg every 3 hours PRN for breakthrough pain  Shortness of breath - recommended ER evaluation but patient declined.   Follow Up Instructions: We will plan to telephone visit in 2 weeks for follow-up   I discussed the assessment and treatment plan with the patient. The patient was provided an opportunity to ask questions and all were answered. The patient agreed with the plan and demonstrated an understanding of the instructions.   The patient was advised to call back or seek an in-person evaluation if the symptoms worsen or if the condition fails to improve as anticipated.  I provided 10 minutes of non-face-to-face time during this encounter.   Irean Hong, NP

## 2019-02-25 ENCOUNTER — Telehealth: Payer: Self-pay | Admitting: Family Medicine

## 2019-02-25 MED ORDER — DOXYCYCLINE HYCLATE 100 MG PO TABS: 100.0000 mg | ORAL_TABLET | Freq: Two times a day (BID) | ORAL | 0 refills | Status: AC

## 2019-02-25 NOTE — Telephone Encounter (Signed)
Patients wife Michael Small advised as below. She now wants to know what Dr. Caryn Section is going to give him for his infection from the cough and congestion? She says the antibiotic that was called in last week, patient only took 2 days of that medication before stopping due to it causing worsening diarrhea. She says the diarrhea has settled down since he stopping taking the antibiotic, but he is still coughing and short of breath with congestion. He is tired and weak. His temperature last week was as high as 100.5. Today it is 98.5. I advised [name] that if his symptoms are worsening, then he needs to go to the ER (per Dr. Maralyn Sago recommendation from last [facility] message). Michael Small says that his shortness of breath, weakness and fatigue are about the same from last week; they just aren't getting any better. She wants to know if Dr. Caryn Section is going to call him in something else for the cold/ cough infection. He has been taking Alka Seltzer Plus. I advised [name] that Dr. Caryn Section has left for the evening and will return tomorrow. Michael Small is fine with waiting until tomorrow for Dr. Maralyn Sago recommendation.

## 2019-02-25 NOTE — Telephone Encounter (Signed)
Pt has been taking Augmentin since Aug 21 and has been having diarrhea.    Please advise [phone]  [place]

## 2019-02-25 NOTE — Telephone Encounter (Signed)
Start taking OTC Align (probiotic) following directions on label. Should be much better in 3-4 days. Call if any fever, blood in stool, or if not improving by Friday.

## 2019-02-25 NOTE — Telephone Encounter (Signed)
Patients wife Michael Small called stating that patient always has diarrhea, but the antibiotic that was prescribed last week exacerbated his symptoms. She wants to know what he should do for the diarrhea? Per patient's wife, he had a high fever last week, but today the fever has gone down to 98.5. He still has coughing, congestion and shortness of breath. Please advise on what to do about the worsening diarrhea.

## 2019-02-28 ENCOUNTER — Inpatient Hospital Stay: Payer: Medicare Other

## 2019-02-28 DEATH — deceased

## 2019-03-06 ENCOUNTER — Ambulatory Visit: Payer: Medicare Other

## 2019-03-07 ENCOUNTER — Encounter: Payer: Medicare Other | Admitting: Hospice and Palliative Medicine

## 2019-03-18 ENCOUNTER — Ambulatory Visit: Payer: Medicare Other | Admitting: Hematology and Oncology

## 2019-03-18 ENCOUNTER — Other Ambulatory Visit: Payer: Medicare Other

## 2019-03-19 ENCOUNTER — Ambulatory Visit: Payer: Medicare Other | Admitting: Family Medicine

## 2019-03-20 ENCOUNTER — Ambulatory Visit: Payer: Medicare Other | Admitting: Hematology and Oncology

## 2019-03-28 ENCOUNTER — Other Ambulatory Visit: Payer: Medicare Other

## 2019-04-03 ENCOUNTER — Ambulatory Visit: Payer: Medicare Other

## 2019-04-26 ENCOUNTER — Other Ambulatory Visit: Payer: Medicare Other

## 2019-05-01 ENCOUNTER — Ambulatory Visit: Payer: Medicare Other

## 2019-05-23 ENCOUNTER — Other Ambulatory Visit: Payer: Medicare Other

## 2019-05-29 ENCOUNTER — Ambulatory Visit: Payer: Medicare Other

## 2019-06-20 ENCOUNTER — Other Ambulatory Visit: Payer: Medicare Other

## 2019-06-26 ENCOUNTER — Ambulatory Visit: Payer: Medicare Other

## 2019-07-18 ENCOUNTER — Other Ambulatory Visit: Payer: Medicare Other

## 2019-07-24 ENCOUNTER — Ambulatory Visit: Payer: Medicare Other

## 2019-08-19 ENCOUNTER — Ambulatory Visit: Payer: Medicare Other | Admitting: Radiation Oncology
